# Patient Record
Sex: Female | Born: 1940 | Race: White | Hispanic: No | Smoking: Never smoker
Health system: Southern US, Community
[De-identification: ages and names within clinical notes are randomized; demographics above are authoritative.]

## PROBLEM LIST (undated history)

## (undated) DIAGNOSIS — G4733 Obstructive sleep apnea (adult) (pediatric): Secondary | ICD-10-CM

## (undated) DIAGNOSIS — M1990 Unspecified osteoarthritis, unspecified site: Secondary | ICD-10-CM

## (undated) DIAGNOSIS — A499 Bacterial infection, unspecified: Secondary | ICD-10-CM

## (undated) DIAGNOSIS — D649 Anemia, unspecified: Secondary | ICD-10-CM

## (undated) DIAGNOSIS — H4020X0 Unspecified primary angle-closure glaucoma, stage unspecified: Secondary | ICD-10-CM

## (undated) DIAGNOSIS — R200 Anesthesia of skin: Secondary | ICD-10-CM

## (undated) DIAGNOSIS — N3281 Overactive bladder: Secondary | ICD-10-CM

## (undated) DIAGNOSIS — T884XXA Failed or difficult intubation, initial encounter: Secondary | ICD-10-CM

## (undated) DIAGNOSIS — M549 Dorsalgia, unspecified: Secondary | ICD-10-CM

## (undated) DIAGNOSIS — G629 Polyneuropathy, unspecified: Secondary | ICD-10-CM

## (undated) DIAGNOSIS — J189 Pneumonia, unspecified organism: Secondary | ICD-10-CM

## (undated) DIAGNOSIS — R32 Unspecified urinary incontinence: Secondary | ICD-10-CM

## (undated) DIAGNOSIS — Z9889 Other specified postprocedural states: Secondary | ICD-10-CM

## (undated) DIAGNOSIS — G8929 Other chronic pain: Secondary | ICD-10-CM

## (undated) DIAGNOSIS — R7303 Prediabetes: Secondary | ICD-10-CM

## (undated) DIAGNOSIS — N390 Urinary tract infection, site not specified: Secondary | ICD-10-CM

## (undated) DIAGNOSIS — Z9989 Dependence on other enabling machines and devices: Secondary | ICD-10-CM

## (undated) DIAGNOSIS — R112 Nausea with vomiting, unspecified: Secondary | ICD-10-CM

## (undated) HISTORY — PX: BACK SURGERY: SHX140

## (undated) HISTORY — PX: APPENDECTOMY: SHX54

## (undated) HISTORY — PX: TUBAL LIGATION: SHX77

## (undated) HISTORY — PX: TONSILLECTOMY AND ADENOIDECTOMY: SUR1326

## (undated) HISTORY — PX: BREAST EXCISIONAL BIOPSY: SUR124

---

## 1978-05-11 HISTORY — PX: DILATION AND CURETTAGE OF UTERUS: SHX78

## 1978-05-11 HISTORY — PX: ABDOMINAL HYSTERECTOMY: SHX81

## 1988-05-11 HISTORY — PX: BLADDER SUSPENSION: SHX72

## 1997-08-01 ENCOUNTER — Ambulatory Visit (HOSPITAL_COMMUNITY): Admission: RE | Admit: 1997-08-01 | Discharge: 1997-08-01 | Payer: Self-pay | Admitting: Infectious Diseases

## 1998-12-25 ENCOUNTER — Ambulatory Visit (HOSPITAL_COMMUNITY): Admission: RE | Admit: 1998-12-25 | Discharge: 1998-12-25 | Payer: Self-pay | Admitting: Internal Medicine

## 1998-12-25 ENCOUNTER — Encounter: Payer: Self-pay | Admitting: Internal Medicine

## 2000-01-05 ENCOUNTER — Encounter: Payer: Self-pay | Admitting: Internal Medicine

## 2000-01-05 ENCOUNTER — Encounter: Admission: RE | Admit: 2000-01-05 | Discharge: 2000-01-05 | Payer: Self-pay | Admitting: Internal Medicine

## 2001-03-29 ENCOUNTER — Encounter: Payer: Self-pay | Admitting: Internal Medicine

## 2001-03-29 ENCOUNTER — Encounter: Admission: RE | Admit: 2001-03-29 | Discharge: 2001-03-29 | Payer: Self-pay | Admitting: Internal Medicine

## 2002-04-20 ENCOUNTER — Ambulatory Visit (HOSPITAL_COMMUNITY): Admission: RE | Admit: 2002-04-20 | Discharge: 2002-04-20 | Payer: Self-pay | Admitting: Gastroenterology

## 2002-06-08 ENCOUNTER — Encounter: Payer: Self-pay | Admitting: Internal Medicine

## 2002-06-08 ENCOUNTER — Encounter: Admission: RE | Admit: 2002-06-08 | Discharge: 2002-06-08 | Payer: Self-pay | Admitting: Internal Medicine

## 2002-12-13 ENCOUNTER — Encounter: Payer: Self-pay | Admitting: Internal Medicine

## 2002-12-13 ENCOUNTER — Ambulatory Visit (HOSPITAL_COMMUNITY): Admission: RE | Admit: 2002-12-13 | Discharge: 2002-12-13 | Payer: Self-pay | Admitting: Internal Medicine

## 2003-08-29 ENCOUNTER — Encounter: Admission: RE | Admit: 2003-08-29 | Discharge: 2003-08-29 | Payer: Self-pay | Admitting: Internal Medicine

## 2005-04-08 ENCOUNTER — Encounter: Admission: RE | Admit: 2005-04-08 | Discharge: 2005-04-08 | Payer: Self-pay | Admitting: Internal Medicine

## 2005-10-29 ENCOUNTER — Encounter: Admission: RE | Admit: 2005-10-29 | Discharge: 2005-10-29 | Payer: Self-pay | Admitting: Internal Medicine

## 2005-12-09 HISTORY — PX: ANTERIOR FUSION CERVICAL SPINE: SUR626

## 2005-12-30 ENCOUNTER — Ambulatory Visit (HOSPITAL_COMMUNITY): Admission: RE | Admit: 2005-12-30 | Discharge: 2005-12-31 | Payer: Self-pay | Admitting: Neurosurgery

## 2006-05-12 ENCOUNTER — Encounter: Admission: RE | Admit: 2006-05-12 | Discharge: 2006-05-12 | Payer: Self-pay | Admitting: Family Medicine

## 2007-06-28 ENCOUNTER — Encounter: Admission: RE | Admit: 2007-06-28 | Discharge: 2007-06-28 | Payer: Self-pay | Admitting: Family Medicine

## 2007-07-21 ENCOUNTER — Ambulatory Visit (HOSPITAL_COMMUNITY): Admission: RE | Admit: 2007-07-21 | Discharge: 2007-07-21 | Payer: Self-pay | Admitting: Neurosurgery

## 2008-06-11 HISTORY — PX: KNEE ARTHROPLASTY: SHX992

## 2008-07-12 ENCOUNTER — Encounter: Admission: RE | Admit: 2008-07-12 | Discharge: 2008-07-12 | Payer: Self-pay | Admitting: Family Medicine

## 2008-08-04 ENCOUNTER — Emergency Department (HOSPITAL_COMMUNITY): Admission: EM | Admit: 2008-08-04 | Discharge: 2008-08-04 | Payer: Self-pay | Admitting: Emergency Medicine

## 2009-11-01 ENCOUNTER — Encounter: Admission: RE | Admit: 2009-11-01 | Discharge: 2009-11-01 | Payer: Self-pay | Admitting: Family Medicine

## 2010-09-26 NOTE — Op Note (Signed)
   NAME:  AILED, DEFIBAUGH NO.:  [id number]   MEDICAL RECORD NO.:  [id number]                   PATIENT TYPE:   LOCATION:                                       FACILITY:   PHYSICIAN:  Danise Edge, M.D.                DATE OF BIRTH:  [birth year]   DATE OF PROCEDURE:  04/20/2002  DATE OF DISCHARGE:                                 OPERATIVE REPORT   INDICATIONS FOR PROCEDURE:  The patient is a 70 year old female born  1940-09-24.  The patient is scheduled to undergo her first screening  colonoscopy with polypectomy to prevent colon cancer.  Her mother underwent  colonoscopic examination to remove colon polyps, but there is no personal or  family history of colon cancer.   ENDOSCOPIST:  Danise Edge, M.D.   PREMEDICATION:  Versed 7.5 mg, Demerol 50 mg.   INSTRUMENT USED:  Olympus pediatric colonoscope.   DESCRIPTION OF PROCEDURE:  After obtaining informed consent, the patient was  placed in the left lateral decubitus position.  I administered intravenous  Demerol and intravenous Versed to achieve conscious sedation for the  patient.  The patient's blood pressure, oxygen saturation, and cardiac  rhythm were monitored throughout the procedure and documented in the medical  record.   Anal inspection was normal. Digital rectal examination was normal.  The  Olympus pediatric video colonoscope was introduced into the rectum and  advanced to the cecum.  A normal appearing ileocecal valve was intubated and  the distal ileum inspected.  Colonic preparation for the examination today  was excellent.   Rectum normal.   Sigmoid colon and descending colon normal.   Splenic flexure normal.   Transverse colon normal.   Hepatic flexure normal.   Ascending colon normal.   Cecum and ileocecal valve normal.   Distal ileum normal.    ASSESSMENT:  Normal screening proctocolonoscopy to the cecum.  No endoscopic  evidence of the presence of  colorectal neoplasia.                                               Danise Edge, M.D.    MJ/MEDQ  D:  04/20/2002  T:  04/20/2002  Job:  [id number]   cc:   Darius Bump, M.D.  [place]  [place] [address]  Fax: [phone]

## 2010-09-26 NOTE — Op Note (Signed)
NAMECLARKE, AMBURN NO.:  [id number]   MEDICAL RECORD NO.:  [id number]          PATIENT TYPE:  OIB   LOCATION:  3004                         FACILITY:  [facility]   PHYSICIAN:  Cristi Loron, M.D.DATE OF BIRTH:  01/10/1941   DATE OF PROCEDURE:  12/30/2005  DATE OF DISCHARGE:                                 OPERATIVE REPORT   BRIEF HISTORY:  The patient is a 70 year old white female who suffered from  neck and right arm pain consistent with a right C7 radiculopathy.  She  failed medical management and I discussed the various treatment with her  including surgery.  The patient has weighed the risks, benefits and  alternatives to surgery and decided top proceed with a C6-7 anterior  cervical diskectomy fusion and plating.   PREOPERATIVE DIAGNOSIS:  C6-7 herniated nucleus pulposus, spinal stenosis,  cervical radiculopathy, cervicalgia.   POSTOPERATIVE DIAGNOSIS:  C6-7 herniated nucleus pulposus, spinal stenosis,  cervical radiculopathy, cervicalgia.   PROCEDURE:  C6-7 extensive anterior cervical diskectomy/decompression; C6-7  interbody local autograft arthrodesis; insertion of a C6-7 interbody  prosthesis (Alphatec interbody PEEK prosthesis); C6-7 anterior cervical  plating (Codman Slim-Lock titanium plate and screws).   SURGEON:  Cristi Loron, M.D.   ASSISTANT:  Clydene Fake, M.D.   ANESTHESIA:  General endotracheal.   ESTIMATED BLOOD LOSS:  50 mL.   SPECIMENS:  None.   DRAINS:  None.   COMPLICATIONS:  None.   DESCRIPTION OF PROCEDURE:  The patient was brought to the operating room by  the anesthesia team.  General endotracheal anesthesia was induced.  The  patient remained in supine position.  A roll was placed under the shoulders  to place the neck in slight extension.  The anterior cervical region was  then prepared with Betadine scrub and Betadine solution.  Sterile drapes  were applied.  I then injected the area to be incised with  Marcaine with  epinephrine solution, used a scalpel to make a transverse incision in the  patient's left anterior neck.  I used Metzenbaum scissors to divide the  platysma muscle and then to dissect medial to the sternocleidomastoid  muscle, jugular vein and carotid artery.  I carefully dissected down toward  the anterior cervical spine, identifying the esophagus and retracting it  medially.  I then used the Kitner swabs to clear the soft tissue from the  anterior cervical spine and then I inserted a bent spinal needle into the  upper exposed intervertebral disk space.  We obtained intraoperative  radiograph to confirm our location.   We then used electrocautery to detach the medial border of the longus colli  muscle bilaterally from the C6-7 intervertebral disk space.  I  then  inserted a Caspar self-retaining for exposure.  I then incised the C6-7  intervertebral disk with 15 blade scalpel and performed a partial  intervertebral diskectomy with the pituitary forceps and the Carlens curet.  I then inserted distraction screws at C6 and C7, distracted the interspace  and then used a high speed drill to decorticate vertebral end plates at C6-  7, drill away  the remainder of the C6-7 intervertebral disk and thinned out  the posterior longitudinal ligament and drilled away some posterior  spondylosis.  I then incised the thinned out ligament with the arachnoid  knife and removed it with the Kerrison punch undercutting the vertebral end  plates at U9-8, decompressing the thecal sac.  I then performed foraminotomy  about the bilateral C7 nerve roots.  I found the expected disk herniation at  C6-7 on the right compressing the right C7 nerve root.  We got a good  decompression bilaterally.   We now turned our attention to arthrodesis.  We obtained a 7 mm medium  Alphatec PEEK interbody prosthesis and filled it with a combination of the  Alphatec putty and local autograft bone we obtained during  the  decompression. We then inserted this filled prosthesis into the distracted  C6-7 interspace.  We then removed distraction screws. There was good snug  fit of the prosthesis.   We now turned our attention to the anterior spinal instrumentation. We  obtained the appropriate length Codman Slim Lock anterior cervical plate and  laid it along the anterior aspect of the vertebral bodies from C6 to C7.  We  drilled two 12 mm holes at C6, two at C7 and then secured the plate at the  vertebral bodies by placing two 12 mm self tapping screws at C6 and two at  C7.  We then obtained an intraoperative radiograph.  We could not see the  plate or screws because of the patient's body habitus/shoulders but they  looked good in vivo.  I should mention that with the first intraoperative x-  ray the needle was in C4-5 and we counted down to the C6-7 interspace.  We  then locked the screws to the plate by locking each cam completing the  instrumentation.   We then obtained hemostasis using bipolar electrocautery.  We irrigated the  wound out with bacitracin solution, removed the solution and the Caspar self-  retaining retractor.  We inspected the esophagus for any damage.  There was  none apparent.  We then reapproximated the patient's platysma muscle with  interrupted 3-0 Vicryl sutures, subcutaneous tissues with interrupted 3-0  Vicryl suture and the skin with benzoin and Steri-Strips.  The wound was  then coated with bacitracin ointment.  A dry sterile dressing was applied.  The drapes were removed.  The patient was subsequently extubated by the  anesthesia team and transported to the post anesthesia care unit in stable  condition.  All sponge, needle and instrument counts were correct at the end  of this case.      Cristi Loron, M.D.  Electronically Signed     JDJ/MEDQ  D:  12/30/2005  T:  12/31/2005  Job:  [id number]

## 2010-11-06 ENCOUNTER — Other Ambulatory Visit: Payer: Self-pay | Admitting: Family Medicine

## 2010-11-06 DIAGNOSIS — Z1231 Encounter for screening mammogram for malignant neoplasm of breast: Secondary | ICD-10-CM

## 2010-12-05 ENCOUNTER — Ambulatory Visit
Admission: RE | Admit: 2010-12-05 | Discharge: 2010-12-05 | Disposition: A | Discharge status: Home / Self Care | Payer: Medicare Other | Source: Ambulatory Visit | Attending: Family Medicine | Admitting: Family Medicine

## 2010-12-05 DIAGNOSIS — Z1231 Encounter for screening mammogram for malignant neoplasm of breast: Secondary | ICD-10-CM

## 2011-01-14 ENCOUNTER — Ambulatory Visit: Payer: Medicare Other | Attending: Family Medicine

## 2011-01-14 DIAGNOSIS — R5381 Other malaise: Secondary | ICD-10-CM | POA: Insufficient documentation

## 2011-01-14 DIAGNOSIS — M545 Low back pain, unspecified: Secondary | ICD-10-CM | POA: Insufficient documentation

## 2011-01-14 DIAGNOSIS — M25569 Pain in unspecified knee: Secondary | ICD-10-CM | POA: Insufficient documentation

## 2011-01-14 DIAGNOSIS — IMO0001 Reserved for inherently not codable concepts without codable children: Secondary | ICD-10-CM | POA: Insufficient documentation

## 2011-01-16 ENCOUNTER — Ambulatory Visit: Payer: Medicare Other

## 2011-01-19 ENCOUNTER — Ambulatory Visit: Payer: Medicare Other

## 2011-01-22 ENCOUNTER — Ambulatory Visit: Payer: Medicare Other

## 2011-01-26 ENCOUNTER — Ambulatory Visit: Payer: Medicare Other | Admitting: Physical Therapy

## 2011-01-28 ENCOUNTER — Ambulatory Visit: Payer: Medicare Other | Admitting: Physical Therapy

## 2011-02-02 ENCOUNTER — Ambulatory Visit: Payer: Medicare Other

## 2011-02-04 ENCOUNTER — Ambulatory Visit: Payer: Medicare Other | Admitting: Physical Therapy

## 2011-05-20 DIAGNOSIS — R3915 Urgency of urination: Secondary | ICD-10-CM | POA: Diagnosis not present

## 2011-05-20 DIAGNOSIS — E669 Obesity, unspecified: Secondary | ICD-10-CM | POA: Diagnosis not present

## 2011-05-20 DIAGNOSIS — G4730 Sleep apnea, unspecified: Secondary | ICD-10-CM | POA: Diagnosis not present

## 2011-06-08 DIAGNOSIS — H04129 Dry eye syndrome of unspecified lacrimal gland: Secondary | ICD-10-CM | POA: Diagnosis not present

## 2011-06-08 DIAGNOSIS — H40019 Open angle with borderline findings, low risk, unspecified eye: Secondary | ICD-10-CM | POA: Diagnosis not present

## 2011-10-19 DIAGNOSIS — R030 Elevated blood-pressure reading, without diagnosis of hypertension: Secondary | ICD-10-CM | POA: Diagnosis not present

## 2011-10-19 DIAGNOSIS — N390 Urinary tract infection, site not specified: Secondary | ICD-10-CM | POA: Diagnosis not present

## 2011-10-19 DIAGNOSIS — R82998 Other abnormal findings in urine: Secondary | ICD-10-CM | POA: Diagnosis not present

## 2011-11-26 DIAGNOSIS — N390 Urinary tract infection, site not specified: Secondary | ICD-10-CM | POA: Diagnosis not present

## 2011-11-26 DIAGNOSIS — M5126 Other intervertebral disc displacement, lumbar region: Secondary | ICD-10-CM | POA: Diagnosis not present

## 2011-12-11 DIAGNOSIS — IMO0002 Reserved for concepts with insufficient information to code with codable children: Secondary | ICD-10-CM | POA: Diagnosis not present

## 2011-12-11 DIAGNOSIS — M545 Low back pain, unspecified: Secondary | ICD-10-CM | POA: Diagnosis not present

## 2011-12-16 ENCOUNTER — Other Ambulatory Visit: Payer: Self-pay | Admitting: Neurosurgery

## 2011-12-16 DIAGNOSIS — M549 Dorsalgia, unspecified: Secondary | ICD-10-CM

## 2011-12-17 ENCOUNTER — Ambulatory Visit
Admission: RE | Admit: 2011-12-17 | Discharge: 2011-12-17 | Disposition: A | Discharge status: Home / Self Care | Payer: Medicare Other | Source: Ambulatory Visit | Attending: Neurosurgery | Admitting: Neurosurgery

## 2011-12-17 DIAGNOSIS — M5126 Other intervertebral disc displacement, lumbar region: Secondary | ICD-10-CM | POA: Diagnosis not present

## 2011-12-17 DIAGNOSIS — M5137 Other intervertebral disc degeneration, lumbosacral region: Secondary | ICD-10-CM | POA: Diagnosis not present

## 2011-12-17 DIAGNOSIS — M549 Dorsalgia, unspecified: Secondary | ICD-10-CM

## 2011-12-17 DIAGNOSIS — M47817 Spondylosis without myelopathy or radiculopathy, lumbosacral region: Secondary | ICD-10-CM | POA: Diagnosis not present

## 2012-01-06 DIAGNOSIS — M545 Low back pain, unspecified: Secondary | ICD-10-CM | POA: Diagnosis not present

## 2012-01-14 ENCOUNTER — Other Ambulatory Visit: Payer: Self-pay | Admitting: Family Medicine

## 2012-01-14 DIAGNOSIS — Z1231 Encounter for screening mammogram for malignant neoplasm of breast: Secondary | ICD-10-CM

## 2012-02-01 DIAGNOSIS — H2510 Age-related nuclear cataract, unspecified eye: Secondary | ICD-10-CM | POA: Diagnosis not present

## 2012-02-01 DIAGNOSIS — H40019 Open angle with borderline findings, low risk, unspecified eye: Secondary | ICD-10-CM | POA: Diagnosis not present

## 2012-02-01 DIAGNOSIS — H04129 Dry eye syndrome of unspecified lacrimal gland: Secondary | ICD-10-CM | POA: Diagnosis not present

## 2012-02-01 DIAGNOSIS — H43399 Other vitreous opacities, unspecified eye: Secondary | ICD-10-CM | POA: Diagnosis not present

## 2012-02-02 DIAGNOSIS — N318 Other neuromuscular dysfunction of bladder: Secondary | ICD-10-CM | POA: Diagnosis not present

## 2012-02-02 DIAGNOSIS — R7309 Other abnormal glucose: Secondary | ICD-10-CM | POA: Diagnosis not present

## 2012-02-02 DIAGNOSIS — Z0000 Encounter for general adult medical examination without abnormal findings: Secondary | ICD-10-CM | POA: Diagnosis not present

## 2012-02-02 DIAGNOSIS — Z23 Encounter for immunization: Secondary | ICD-10-CM | POA: Diagnosis not present

## 2012-02-02 DIAGNOSIS — Z136 Encounter for screening for cardiovascular disorders: Secondary | ICD-10-CM | POA: Diagnosis not present

## 2012-02-02 DIAGNOSIS — E559 Vitamin D deficiency, unspecified: Secondary | ICD-10-CM | POA: Diagnosis not present

## 2012-02-02 DIAGNOSIS — Z79899 Other long term (current) drug therapy: Secondary | ICD-10-CM | POA: Diagnosis not present

## 2012-02-03 ENCOUNTER — Ambulatory Visit
Admission: RE | Admit: 2012-02-03 | Discharge: 2012-02-03 | Disposition: A | Discharge status: Home / Self Care | Payer: Medicare Other | Source: Ambulatory Visit | Attending: Family Medicine | Admitting: Family Medicine

## 2012-02-03 DIAGNOSIS — Z1231 Encounter for screening mammogram for malignant neoplasm of breast: Secondary | ICD-10-CM

## 2012-02-10 ENCOUNTER — Other Ambulatory Visit: Payer: Self-pay | Admitting: Neurosurgery

## 2012-03-15 DIAGNOSIS — M899 Disorder of bone, unspecified: Secondary | ICD-10-CM | POA: Diagnosis not present

## 2012-03-15 DIAGNOSIS — Z780 Asymptomatic menopausal state: Secondary | ICD-10-CM | POA: Diagnosis not present

## 2012-03-15 DIAGNOSIS — R29890 Loss of height: Secondary | ICD-10-CM | POA: Diagnosis not present

## 2012-03-15 DIAGNOSIS — M949 Disorder of cartilage, unspecified: Secondary | ICD-10-CM | POA: Diagnosis not present

## 2012-03-21 DIAGNOSIS — M2040 Other hammer toe(s) (acquired), unspecified foot: Secondary | ICD-10-CM | POA: Diagnosis not present

## 2012-03-21 DIAGNOSIS — M7150 Other bursitis, not elsewhere classified, unspecified site: Secondary | ICD-10-CM | POA: Diagnosis not present

## 2012-03-21 DIAGNOSIS — M79609 Pain in unspecified limb: Secondary | ICD-10-CM | POA: Diagnosis not present

## 2012-03-21 DIAGNOSIS — B351 Tinea unguium: Secondary | ICD-10-CM | POA: Diagnosis not present

## 2012-04-08 ENCOUNTER — Encounter (HOSPITAL_COMMUNITY): Payer: Self-pay | Admitting: Pharmacy Technician

## 2012-04-13 ENCOUNTER — Encounter (HOSPITAL_COMMUNITY)
Admission: RE | Admit: 2012-04-13 | Discharge: 2012-04-13 | Disposition: A | Discharge status: Home / Self Care | Payer: Medicare Other | Source: Ambulatory Visit | Attending: Neurosurgery | Admitting: Neurosurgery

## 2012-04-13 ENCOUNTER — Encounter (HOSPITAL_COMMUNITY): Payer: Self-pay

## 2012-04-13 HISTORY — DX: Nausea with vomiting, unspecified: R11.2

## 2012-04-13 HISTORY — DX: Other specified postprocedural states: Z98.890

## 2012-04-13 HISTORY — DX: Unspecified urinary incontinence: R32

## 2012-04-13 HISTORY — DX: Unspecified osteoarthritis, unspecified site: M19.90

## 2012-04-13 LAB — CBC
HCT: 39.4 % (ref 36.0–46.0)
Hemoglobin: 13.2 g/dL (ref 12.0–15.0)
MCH: 28 pg (ref 26.0–34.0)
MCHC: 33.5 g/dL (ref 30.0–36.0)
RDW: 13.3 % (ref 11.5–15.5)

## 2012-04-13 LAB — BASIC METABOLIC PANEL
BUN: 15 mg/dL (ref 6–23)
Calcium: 9.4 mg/dL (ref 8.4–10.5)
GFR calc Af Amer: 83 mL/min — ABNORMAL LOW (ref >90)
GFR calc non Af Amer: 72 mL/min — ABNORMAL LOW (ref >90)
Glucose, Bld: 103 mg/dL — ABNORMAL HIGH (ref 70–99)
Sodium: 139 mEq/L (ref 135–145)

## 2012-04-13 LAB — ABO/RH: ABO/RH(D): O POS

## 2012-04-13 NOTE — Pre-Procedure Instructions (Signed)
20 Lauren English  04/13/2012   Your procedure is scheduled on:  04/21/12  Report to [facility] at 530 AM.  Call this number if you have problems the morning of surgery: [phone]   Remember:   Do not eat food:After Midnight.    Take these medicines the morning of surgery with A SIP OF WATER: cipro,hydrocodone,ditropan   Do not wear jewelry, make-up or nail polish.  Do not wear lotions, powders, or perfumes. You may wear deodorant.  Do not shave 48 hours prior to surgery. Men may shave face and neck.  Do not bring valuables to the hospital.  Contacts, dentures or bridgework may not be worn into surgery.  Leave suitcase in the car. After surgery it may be brought to your room.  For patients admitted to the hospital, checkout time is 11:00 AM the day of discharge.   Patients discharged the day of surgery will not be allowed to drive home.  Name and phone number of your driver: family  Special Instructions: Shower using CHG 2 nights before surgery and the night before surgery.  If you shower the day of surgery use CHG.  Use special wash - you have one bottle of CHG for all showers.  You should use approximately 1/3 of the bottle for each shower.   Please read over the following fact sheets that you were given: Pain Booklet, Coughing and Deep Breathing, Blood Transfusion Information, MRSA Information and Surgical Site Infection Prevention

## 2012-04-20 MED ORDER — CEFAZOLIN SODIUM-DEXTROSE 2-3 GM-% IV SOLR
2.0000 g | INTRAVENOUS | Status: AC
  Administered 2012-04-21: 2 g via INTRAVENOUS
  Filled 2012-04-20: qty 50

## 2012-04-21 ENCOUNTER — Inpatient Hospital Stay (HOSPITAL_COMMUNITY): Payer: Medicare Other

## 2012-04-21 ENCOUNTER — Encounter (HOSPITAL_COMMUNITY): Payer: Self-pay | Admitting: General Practice

## 2012-04-21 ENCOUNTER — Inpatient Hospital Stay (HOSPITAL_COMMUNITY)
Admission: RE | Admit: 2012-04-21 | Discharge: 2012-04-25 | DRG: 460 | Disposition: A | Discharge status: Home / Self Care | Payer: Medicare Other | Source: Ambulatory Visit | Attending: Neurosurgery | Admitting: Neurosurgery

## 2012-04-21 ENCOUNTER — Inpatient Hospital Stay (HOSPITAL_COMMUNITY): Payer: Medicare Other | Admitting: Anesthesiology

## 2012-04-21 ENCOUNTER — Encounter (HOSPITAL_COMMUNITY): Payer: Self-pay | Admitting: Anesthesiology

## 2012-04-21 ENCOUNTER — Encounter (HOSPITAL_COMMUNITY): Payer: Self-pay | Admitting: *Deleted

## 2012-04-21 ENCOUNTER — Encounter (HOSPITAL_COMMUNITY)
Admission: RE | Disposition: A | Discharge status: Home / Self Care | Payer: Self-pay | Source: Ambulatory Visit | Attending: Neurosurgery

## 2012-04-21 DIAGNOSIS — IMO0002 Reserved for concepts with insufficient information to code with codable children: Secondary | ICD-10-CM | POA: Diagnosis not present

## 2012-04-21 DIAGNOSIS — M51379 Other intervertebral disc degeneration, lumbosacral region without mention of lumbar back pain or lower extremity pain: Secondary | ICD-10-CM | POA: Diagnosis present

## 2012-04-21 DIAGNOSIS — M519 Unspecified thoracic, thoracolumbar and lumbosacral intervertebral disc disorder: Secondary | ICD-10-CM | POA: Diagnosis not present

## 2012-04-21 DIAGNOSIS — M4310 Spondylolisthesis, site unspecified: Secondary | ICD-10-CM | POA: Diagnosis present

## 2012-04-21 DIAGNOSIS — M5137 Other intervertebral disc degeneration, lumbosacral region: Principal | ICD-10-CM | POA: Diagnosis present

## 2012-04-21 DIAGNOSIS — M4316 Spondylolisthesis, lumbar region: Secondary | ICD-10-CM

## 2012-04-21 DIAGNOSIS — M545 Low back pain, unspecified: Secondary | ICD-10-CM | POA: Diagnosis not present

## 2012-04-21 DIAGNOSIS — M48061 Spinal stenosis, lumbar region without neurogenic claudication: Secondary | ICD-10-CM | POA: Diagnosis not present

## 2012-04-21 HISTORY — DX: Dependence on other enabling machines and devices: Z99.89

## 2012-04-21 HISTORY — DX: Other chronic pain: G89.29

## 2012-04-21 HISTORY — DX: Prediabetes: R73.03

## 2012-04-21 HISTORY — DX: Unspecified primary angle-closure glaucoma, stage unspecified: H40.20X0

## 2012-04-21 HISTORY — DX: Dorsalgia, unspecified: M54.9

## 2012-04-21 HISTORY — DX: Obstructive sleep apnea (adult) (pediatric): G47.33

## 2012-04-21 HISTORY — PX: POSTERIOR LUMBAR FUSION: SHX6036

## 2012-04-21 SURGERY — POSTERIOR LUMBAR FUSION 1 LEVEL
Anesthesia: General | Site: Back | Laterality: Bilateral | Wound class: Clean

## 2012-04-21 MED ORDER — ONDANSETRON HCL 4 MG/2ML IJ SOLN: 4.0000 mg | INTRAMUSCULAR | Status: DC | PRN

## 2012-04-21 MED ORDER — BUPIVACAINE-EPINEPHRINE PF 0.5-1:200000 % IJ SOLN
INTRAMUSCULAR | Status: DC | PRN
  Administered 2012-04-21: 10 mL

## 2012-04-21 MED ORDER — MORPHINE SULFATE (PF) 1 MG/ML IV SOLN
INTRAVENOUS | Status: AC
  Filled 2012-04-21: qty 25

## 2012-04-21 MED ORDER — BUPIVACAINE LIPOSOME 1.3 % IJ SUSP
8.0000 mL | INTRAMUSCULAR | Status: DC
  Filled 2012-04-21: qty 20

## 2012-04-21 MED ORDER — ONDANSETRON HCL 4 MG/2ML IJ SOLN: 4.0000 mg | Freq: Four times a day (QID) | INTRAMUSCULAR | Status: DC | PRN

## 2012-04-21 MED ORDER — ROCURONIUM BROMIDE 100 MG/10ML IV SOLN
INTRAVENOUS | Status: DC | PRN
  Administered 2012-04-21: 50 mg via INTRAVENOUS
  Administered 2012-04-21: 10 mg via INTRAVENOUS

## 2012-04-21 MED ORDER — DOCUSATE SODIUM 100 MG PO CAPS
100.0000 mg | ORAL_CAPSULE | Freq: Two times a day (BID) | ORAL | Status: DC
  Administered 2012-04-21 – 2012-04-25 (×8): 100 mg via ORAL
  Filled 2012-04-21 (×8): qty 1

## 2012-04-21 MED ORDER — SODIUM CHLORIDE 0.9 % IJ SOLN
3.0000 mL | Freq: Two times a day (BID) | INTRAMUSCULAR | Status: DC
  Administered 2012-04-21 – 2012-04-24 (×6): 3 mL via INTRAVENOUS

## 2012-04-21 MED ORDER — DIPHENHYDRAMINE HCL 50 MG/ML IJ SOLN: 12.5000 mg | Freq: Four times a day (QID) | INTRAMUSCULAR | Status: DC | PRN

## 2012-04-21 MED ORDER — MORPHINE SULFATE (PF) 1 MG/ML IV SOLN
INTRAVENOUS | Status: DC
  Administered 2012-04-21: 4.5 mg via INTRAVENOUS
  Administered 2012-04-21: 14:00:00 via INTRAVENOUS
  Administered 2012-04-21: 10.5 mg via INTRAVENOUS
  Administered 2012-04-22 (×4): 1.5 mg via INTRAVENOUS
  Administered 2012-04-22: 12:00:00 via INTRAVENOUS
  Administered 2012-04-22: 1.5 mg via INTRAVENOUS
  Administered 2012-04-22: 3.71 mg via INTRAVENOUS
  Administered 2012-04-22: 3 mg via INTRAVENOUS
  Administered 2012-04-23: 1.5 mg via INTRAVENOUS
  Administered 2012-04-23: 0.1 mg via INTRAVENOUS
  Filled 2012-04-21: qty 25

## 2012-04-21 MED ORDER — ZOLPIDEM TARTRATE 5 MG PO TABS
5.0000 mg | ORAL_TABLET | Freq: Every evening | ORAL | Status: DC | PRN
  Filled 2012-04-21: qty 1

## 2012-04-21 MED ORDER — LIDOCAINE HCL (CARDIAC) 20 MG/ML IV SOLN
INTRAVENOUS | Status: DC | PRN
  Administered 2012-04-21: 80 mg via INTRAVENOUS

## 2012-04-21 MED ORDER — OXYCODONE HCL 5 MG PO TABS: 5.0000 mg | ORAL_TABLET | Freq: Once | ORAL | Status: DC | PRN

## 2012-04-21 MED ORDER — DIPHENHYDRAMINE HCL 12.5 MG/5ML PO ELIX: 12.5000 mg | ORAL_SOLUTION | Freq: Four times a day (QID) | ORAL | Status: DC | PRN

## 2012-04-21 MED ORDER — LIDOCAINE HCL 4 % MT SOLN
OROMUCOSAL | Status: DC | PRN
  Administered 2012-04-21: 4 mL via TOPICAL

## 2012-04-21 MED ORDER — HYDROMORPHONE HCL PF 1 MG/ML IJ SOLN
INTRAMUSCULAR | Status: AC
  Filled 2012-04-21: qty 1

## 2012-04-21 MED ORDER — SODIUM CHLORIDE 0.9 % IJ SOLN: 3.0000 mL | INTRAMUSCULAR | Status: DC | PRN

## 2012-04-21 MED ORDER — SODIUM CHLORIDE 0.9 % IV SOLN
INTRAVENOUS | Status: AC
  Filled 2012-04-21: qty 500

## 2012-04-21 MED ORDER — NALOXONE HCL 0.4 MG/ML IJ SOLN: 0.4000 mg | INTRAMUSCULAR | Status: DC | PRN

## 2012-04-21 MED ORDER — BUPIVACAINE LIPOSOME 1.3 % IJ SUSP
INTRAMUSCULAR | Status: DC | PRN
  Administered 2012-04-21: 20 mL

## 2012-04-21 MED ORDER — ONDANSETRON HCL 4 MG/2ML IJ SOLN
INTRAMUSCULAR | Status: DC | PRN
  Administered 2012-04-21 (×2): 4 mg via INTRAVENOUS

## 2012-04-21 MED ORDER — BACITRACIN ZINC 500 UNIT/GM EX OINT
TOPICAL_OINTMENT | CUTANEOUS | Status: DC | PRN
  Administered 2012-04-21: 1 via TOPICAL

## 2012-04-21 MED ORDER — BUPIVACAINE LIPOSOME 1.3 % IJ SUSP: 8.0000 mL | INTRAMUSCULAR | Status: DC

## 2012-04-21 MED ORDER — ACETAMINOPHEN 650 MG RE SUPP: 650.0000 mg | RECTAL | Status: DC | PRN

## 2012-04-21 MED ORDER — NEOSTIGMINE METHYLSULFATE 1 MG/ML IJ SOLN
INTRAMUSCULAR | Status: DC | PRN
  Administered 2012-04-21: 4 mg via INTRAVENOUS

## 2012-04-21 MED ORDER — PROPOFOL 10 MG/ML IV BOLUS
INTRAVENOUS | Status: DC | PRN
  Administered 2012-04-21: 200 mg via INTRAVENOUS

## 2012-04-21 MED ORDER — MIDAZOLAM HCL 5 MG/5ML IJ SOLN
INTRAMUSCULAR | Status: DC | PRN
  Administered 2012-04-21 (×2): 1 mg via INTRAVENOUS

## 2012-04-21 MED ORDER — DIAZEPAM 5 MG PO TABS: 5.0000 mg | ORAL_TABLET | Freq: Four times a day (QID) | ORAL | Status: DC | PRN

## 2012-04-21 MED ORDER — GLYCOPYRROLATE 0.2 MG/ML IJ SOLN
INTRAMUSCULAR | Status: DC | PRN
  Administered 2012-04-21: .8 mg via INTRAVENOUS

## 2012-04-21 MED ORDER — 0.9 % SODIUM CHLORIDE (POUR BTL) OPTIME
TOPICAL | Status: DC | PRN
  Administered 2012-04-21: 1000 mL

## 2012-04-21 MED ORDER — SODIUM CHLORIDE 0.9 % IJ SOLN: 9.0000 mL | INTRAMUSCULAR | Status: DC | PRN

## 2012-04-21 MED ORDER — OXYCODONE HCL 5 MG/5ML PO SOLN: 5.0000 mg | Freq: Once | ORAL | Status: DC | PRN

## 2012-04-21 MED ORDER — SODIUM CHLORIDE 0.9 % IR SOLN
Status: DC | PRN
  Administered 2012-04-21: 08:00:00

## 2012-04-21 MED ORDER — METOCLOPRAMIDE HCL 5 MG/ML IJ SOLN: 10.0000 mg | Freq: Once | INTRAMUSCULAR | Status: DC | PRN

## 2012-04-21 MED ORDER — OXYCODONE-ACETAMINOPHEN 5-325 MG PO TABS
1.0000 | ORAL_TABLET | ORAL | Status: DC | PRN
  Filled 2012-04-21: qty 2

## 2012-04-21 MED ORDER — PHENOL 1.4 % MT LIQD: 1.0000 | OROMUCOSAL | Status: DC | PRN

## 2012-04-21 MED ORDER — FENTANYL CITRATE 0.05 MG/ML IJ SOLN
INTRAMUSCULAR | Status: DC | PRN
  Administered 2012-04-21: 50 ug via INTRAVENOUS
  Administered 2012-04-21: 100 ug via INTRAVENOUS
  Administered 2012-04-21 (×4): 50 ug via INTRAVENOUS

## 2012-04-21 MED ORDER — MENTHOL 3 MG MT LOZG: 1.0000 | LOZENGE | OROMUCOSAL | Status: DC | PRN

## 2012-04-21 MED ORDER — CEFAZOLIN SODIUM-DEXTROSE 2-3 GM-% IV SOLR
2.0000 g | Freq: Three times a day (TID) | INTRAVENOUS | Status: AC
  Administered 2012-04-21 – 2012-04-22 (×2): 2 g via INTRAVENOUS
  Filled 2012-04-21 (×2): qty 50

## 2012-04-21 MED ORDER — LACTATED RINGERS IV SOLN
INTRAVENOUS | Status: DC | PRN
  Administered 2012-04-21 (×3): via INTRAVENOUS

## 2012-04-21 MED ORDER — DEXAMETHASONE SODIUM PHOSPHATE 4 MG/ML IJ SOLN
INTRAMUSCULAR | Status: DC | PRN
  Administered 2012-04-21: 8 mg via INTRAVENOUS

## 2012-04-21 MED ORDER — SODIUM CHLORIDE 0.9 % IV SOLN
10.0000 mg | INTRAVENOUS | Status: DC | PRN
  Administered 2012-04-21: 5 ug/min via INTRAVENOUS

## 2012-04-21 MED ORDER — SODIUM CHLORIDE 0.9 % IV SOLN: 250.0000 mL | INTRAVENOUS | Status: DC

## 2012-04-21 MED ORDER — HYDROMORPHONE HCL PF 1 MG/ML IJ SOLN
0.2500 mg | INTRAMUSCULAR | Status: DC | PRN
  Administered 2012-04-21 (×4): 0.5 mg via INTRAVENOUS

## 2012-04-21 MED ORDER — BACITRACIN 50000 UNITS IM SOLR
INTRAMUSCULAR | Status: AC
  Filled 2012-04-21: qty 1

## 2012-04-21 MED ORDER — ACETAMINOPHEN 325 MG PO TABS
650.0000 mg | ORAL_TABLET | ORAL | Status: DC | PRN
  Filled 2012-04-21: qty 2

## 2012-04-21 MED ORDER — SURGIFOAM 100 EX MISC
CUTANEOUS | Status: DC | PRN
  Administered 2012-04-21: 08:00:00 via TOPICAL

## 2012-04-21 MED ORDER — OXYBUTYNIN CHLORIDE 5 MG PO TABS
5.0000 mg | ORAL_TABLET | Freq: Every evening | ORAL | Status: DC
  Administered 2012-04-21 – 2012-04-24 (×4): 5 mg via ORAL
  Filled 2012-04-21 (×6): qty 1

## 2012-04-21 MED ORDER — HYDROCODONE-ACETAMINOPHEN 5-325 MG PO TABS
1.0000 | ORAL_TABLET | ORAL | Status: DC | PRN
  Administered 2012-04-23 – 2012-04-25 (×11): 1 via ORAL
  Filled 2012-04-21 (×12): qty 1

## 2012-04-21 MED ORDER — LACTATED RINGERS IV SOLN
INTRAVENOUS | Status: DC
  Administered 2012-04-21 – 2012-04-22 (×3): via INTRAVENOUS

## 2012-04-21 MED ORDER — OXYCODONE HCL 5 MG PO TABS
ORAL_TABLET | ORAL | Status: AC
  Filled 2012-04-21: qty 1

## 2012-04-21 SURGICAL SUPPLY — 67 items
BAG DECANTER FOR FLEXI CONT (MISCELLANEOUS) ×2 IMPLANT
BENZOIN TINCTURE PRP APPL 2/3 (GAUZE/BANDAGES/DRESSINGS) ×2 IMPLANT
BLADE SURG ROTATE 9660 (MISCELLANEOUS) IMPLANT
BRUSH SCRUB EZ PLAIN DRY (MISCELLANEOUS) ×2 IMPLANT
BUR ACORN 6.0 (BURR) ×2 IMPLANT
BUR MATCHSTICK NEURO 3.0 LAGG (BURR) ×2 IMPLANT
CANISTER SUCTION 2500CC (MISCELLANEOUS) ×2 IMPLANT
CAP REVERE LOCKING (Cap) ×8 IMPLANT
CLOTH BEACON ORANGE TIMEOUT ST (SAFETY) ×2 IMPLANT
CONT SPEC 4OZ CLIKSEAL STRL BL (MISCELLANEOUS) ×2 IMPLANT
COVER BACK TABLE 24X17X13 BIG (DRAPES) IMPLANT
COVER TABLE BACK 60X90 (DRAPES) ×2 IMPLANT
DRAPE C-ARM 42X72 X-RAY (DRAPES) ×8 IMPLANT
DRAPE LAPAROTOMY 100X72X124 (DRAPES) ×2 IMPLANT
DRAPE POUCH INSTRU U-SHP 10X18 (DRAPES) ×2 IMPLANT
DRAPE PROXIMA HALF (DRAPES) ×6 IMPLANT
DRAPE SURG 17X23 STRL (DRAPES) ×8 IMPLANT
ELECT BLADE 4.0 EZ CLEAN MEGAD (MISCELLANEOUS) ×2
ELECT REM PT RETURN 9FT ADLT (ELECTROSURGICAL) ×2
ELECTRODE BLDE 4.0 EZ CLN MEGD (MISCELLANEOUS) ×1 IMPLANT
ELECTRODE REM PT RTRN 9FT ADLT (ELECTROSURGICAL) ×1 IMPLANT
EVACUATOR 1/8 PVC DRAIN (DRAIN) ×2 IMPLANT
GAUZE SPONGE 4X4 16PLY XRAY LF (GAUZE/BANDAGES/DRESSINGS) ×2 IMPLANT
GLOVE BIO SURGEON STRL SZ 6.5 (GLOVE) ×4 IMPLANT
GLOVE BIO SURGEON STRL SZ8.5 (GLOVE) ×4 IMPLANT
GLOVE BIOGEL PI IND STRL 7.0 (GLOVE) ×4 IMPLANT
GLOVE BIOGEL PI INDICATOR 7.0 (GLOVE) ×4
GLOVE ECLIPSE 7.5 STRL STRAW (GLOVE) ×2 IMPLANT
GLOVE EXAM NITRILE LRG STRL (GLOVE) IMPLANT
GLOVE EXAM NITRILE MD LF STRL (GLOVE) ×4 IMPLANT
GLOVE EXAM NITRILE XL STR (GLOVE) IMPLANT
GLOVE EXAM NITRILE XS STR PU (GLOVE) IMPLANT
GLOVE SS BIOGEL STRL SZ 6.5 (GLOVE) ×4 IMPLANT
GLOVE SS BIOGEL STRL SZ 8 (GLOVE) ×2 IMPLANT
GLOVE SUPERSENSE BIOGEL SZ 6.5 (GLOVE) ×4
GLOVE SUPERSENSE BIOGEL SZ 8 (GLOVE) ×2
GOWN BRE IMP SLV AUR LG STRL (GOWN DISPOSABLE) ×6 IMPLANT
GOWN BRE IMP SLV AUR XL STRL (GOWN DISPOSABLE) ×6 IMPLANT
GOWN STRL REIN 2XL LVL4 (GOWN DISPOSABLE) IMPLANT
KIT BASIN OR (CUSTOM PROCEDURE TRAY) ×2 IMPLANT
KIT ROOM TURNOVER OR (KITS) ×2 IMPLANT
NEEDLE HYPO 21X1.5 SAFETY (NEEDLE) ×2 IMPLANT
NEEDLE HYPO 25X1 1.5 SAFETY (NEEDLE) ×2 IMPLANT
NS IRRIG 1000ML POUR BTL (IV SOLUTION) ×2 IMPLANT
PACK FOAM VITOSS 10CC (Orthopedic Implant) ×2 IMPLANT
PACK LAMINECTOMY NEURO (CUSTOM PROCEDURE TRAY) ×2 IMPLANT
PAD ARMBOARD 7.5X6 YLW CONV (MISCELLANEOUS) ×6 IMPLANT
PATTIES SURGICAL .5 X1 (DISPOSABLE) IMPLANT
PUTTY 10ML ACTIFUSE ABX (Putty) ×2 IMPLANT
ROD REVERE 6.35 40MM (Rod) ×4 IMPLANT
SCREW 7.5X50MM (Screw) ×8 IMPLANT
SPACER SUSTAIN O 10X26 12MM (Spacer) ×4 IMPLANT
SPONGE GAUZE 4X4 12PLY (GAUZE/BANDAGES/DRESSINGS) ×2 IMPLANT
SPONGE LAP 4X18 X RAY DECT (DISPOSABLE) IMPLANT
SPONGE NEURO XRAY DETECT 1X3 (DISPOSABLE) IMPLANT
SPONGE SURGIFOAM ABS GEL 100 (HEMOSTASIS) ×2 IMPLANT
STRIP CLOSURE SKIN 1/2X4 (GAUZE/BANDAGES/DRESSINGS) ×2 IMPLANT
SUT VIC AB 1 CT1 18XBRD ANBCTR (SUTURE) ×2 IMPLANT
SUT VIC AB 1 CT1 8-18 (SUTURE) ×2
SUT VIC AB 2-0 CP2 18 (SUTURE) ×4 IMPLANT
SYR 20CC LL (SYRINGE) ×2 IMPLANT
SYR 20ML ECCENTRIC (SYRINGE) ×2 IMPLANT
TAPE CLOTH SURG 4X10 WHT LF (GAUZE/BANDAGES/DRESSINGS) ×2 IMPLANT
TOWEL OR 17X24 6PK STRL BLUE (TOWEL DISPOSABLE) ×2 IMPLANT
TOWEL OR 17X26 10 PK STRL BLUE (TOWEL DISPOSABLE) ×2 IMPLANT
TRAY FOLEY CATH 14FRSI W/METER (CATHETERS) ×2 IMPLANT
WATER STERILE IRR 1000ML POUR (IV SOLUTION) ×2 IMPLANT

## 2012-04-21 NOTE — Anesthesia Postprocedure Evaluation (Signed)
Anesthesia Post Note  Patient: Lauren English  Procedure(s) Performed: Procedure(s) (LRB): POSTERIOR LUMBAR FUSION 1 LEVEL (Bilateral)  Anesthesia type: general  Patient location: PACU  Post pain: Pain level controlled  Post assessment: Patient's Cardiovascular Status Stable  Last Vitals:  Filed Vitals:   04/21/12 1145  BP: 145/69  Pulse: 66  Temp: 36.4 C  Resp: 12    Post vital signs: Reviewed and stable  Level of consciousness: sedated  Complications: No apparent anesthesia complications

## 2012-04-21 NOTE — Transfer of Care (Signed)
Immediate Anesthesia Transfer of Care Note  Patient: Lauren English  Procedure(s) Performed: Procedure(s) (LRB) with comments: POSTERIOR LUMBAR FUSION 1 LEVEL (Bilateral) - Lumbar four-five laminectomy with posterior lumbar interbody fusion witn interbody prothesis posterolateral arthrodesis and posterior nonsegmental instrumentation  Patient Location: PACU  Anesthesia Type:General  Level of Consciousness: awake and alert   Airway & Oxygen Therapy: Patient Spontanous Breathing and Patient connected to nasal cannula oxygen  Post-op Assessment: Report given to PACU RN and Post -op Vital signs reviewed and stable  Post vital signs: Reviewed and stable  Complications: No apparent anesthesia complications

## 2012-04-21 NOTE — H&P (Signed)
Subjective: The patient is a 71 year old white female who has suffered from back and leg pain. She has failed medical management and was worked up with a lumbar MRI. This demonstrated a spondylolisthesis and spinal stenosis at L4-5. I discussed the various treatment options with the patient including surgery. The patient has weighed the risks, benefits, and alternatives surgery and decided proceed with an L4-5 decompression, instrumentation, and fusion.   Past Medical History  Diagnosis Date  . PONV (postoperative nausea and vomiting)   . Sleep apnea   . Bladder incontinence   . Glaucoma   . Arthritis     op    Past Surgical History  Procedure Date  . Back surgery   . Knee arthroplasty   . Abdominal hysterectomy   . Tonsillectomy     No Known Allergies  History  Substance Use Topics  . Smoking status: Never Smoker   . Smokeless tobacco: Not on file  . Alcohol Use: Yes     Comment: occ    History reviewed. No pertinent family history. Prior to Admission medications   Medication Sig Start Date End Date Taking? Authorizing Provider  Acetylcarnitine HCl (ACETYL L-CARNITINE) 500 MG CAPS Take 500 mg by mouth daily.   Yes Historical Provider, MD  cholecalciferol (VITAMIN D) 1000 UNITS tablet Take 1,000 Units by mouth 2 (two) times daily.   Yes Historical Provider, MD  ciprofloxacin (CIPRO) 500 MG tablet Take 500 mg by mouth 2 (two) times daily as needed. 3 day course for burning and frequency of urination, current course started Wednesday 04/06/12   Yes Historical Provider, MD  CRANBERRY PO Take 870 mg by mouth 2 (two) times daily.   Yes Historical Provider, MD  docusate sodium (COLACE) 100 MG capsule Take 100 mg by mouth 2 (two) times daily.   Yes Historical Provider, MD  Glucosamine-Chondroitin 750-600 MG TABS Take 1 tablet by mouth 2 (two) times daily.   Yes Historical Provider, MD  HYDROcodone-acetaminophen (NORCO) 10-325 MG per tablet Take 1 tablet by mouth 3 (three) times daily.   01/22/12  Yes Historical Provider, MD  Boris Lown Oil 300 MG CAPS Take 300 mg by mouth daily.   Yes Historical Provider, MD  Multiple Minerals-Vitamins (CALCIUM CITRATE PLUS PO) Take 500 mg by mouth 2 (two) times daily. With 800 i.u. Vitamin d3, 10 mg vitamin b-6, 80 mg magnesium, 10 mg zinc, 1 mg copper, 1 mg manganese, 5 mg sodium, 1 mg boron   Yes Historical Provider, MD  oxybutynin (DITROPAN) 5 MG tablet Take 5 mg by mouth every evening.  12/31/11  Yes Historical Provider, MD  TURMERIC CURCUMIN PO Take 250 mg by mouth every morning.   Yes Historical Provider, MD  diclofenac sodium (VOLTAREN) 1 % GEL Apply 1 g topically daily as needed. For toe pain    Historical Provider, MD     Review of Systems  Positive ROS: As above  All other systems have been reviewed and were otherwise negative with the exception of those mentioned in the HPI and as above.  Objective: Vital signs in last 24 hours: Temp:  [98 F (36.7 C)] 98 F (36.7 C) (12/12 0606) Pulse Rate:  [67] 67  (12/12 0606) Resp:  [20] 20  (12/12 0606) BP: (122)/(71) 122/71 mmHg (12/12 0606) SpO2:  [96 %] 96 % (12/12 0606)  General Appearance: Alert, cooperative, no distress, appears stated age Head: Normocephalic, without obvious abnormality, atraumatic Eyes: PERRL, conjunctiva/corneas clear, EOM's intact, fundi benign, both eyes  Ears: Normal TM's and external ear canals, both ears Throat: Lips, mucosa, and tongue normal; teeth and gums normal Neck: Supple, symmetrical, trachea midline, no adenopathy; thyroid: No enlargement/tenderness/nodules; no carotid bruit or JVD Back: Symmetric, no curvature, ROM normal, no CVA tenderness Lungs: Clear to auscultation bilaterally, respirations unlabored Heart: Regular rate and rhythm, S1 and S2 normal, no murmur, rub or gallop Abdomen: Soft, non-tender, bowel sounds active all four quadrants, no masses, no organomegaly Extremities: Extremities normal, atraumatic, no cyanosis or edema Pulses:  2+ and symmetric all extremities Skin: Skin color, texture, turgor normal, no rashes or lesions  NEUROLOGIC:   Mental status: alert and oriented, no aphasia, good attention span, Fund of knowledge/ memory ok Motor Exam - grossly normal Sensory Exam - grossly normal Reflexes:  Coordination - grossly normal Gait - grossly normal Balance - grossly normal Cranial Nerves: I: smell Not tested  II: visual acuity  OS: Normal    OD: Normal   II: visual fields Full to confrontation  II: pupils Equal, round, reactive to light  III,VII: ptosis None  III,IV,VI: extraocular muscles  Full ROM  V: mastication Normal  V: facial light touch sensation  Normal  V,VII: corneal reflex  Present  VII: facial muscle function - upper  Normal  VII: facial muscle function - lower Normal  VIII: hearing Not tested  IX: soft palate elevation  Normal  IX,X: gag reflex Present  XI: trapezius strength  5/5  XI: sternocleidomastoid strength 5/5  XI: neck flexion strength  5/5  XII: tongue strength  Normal    Data Review Lab Results  Component Value Date   WBC 5.9 04/13/2012   HGB 13.2 04/13/2012   HCT 39.4 04/13/2012   MCV 83.7 04/13/2012   PLT 180 04/13/2012   Lab Results  Component Value Date   NA 139 04/13/2012   K 4.0 04/13/2012   CL 104 04/13/2012   CO2 26 04/13/2012   BUN 15 04/13/2012   CREATININE 0.82 04/13/2012   GLUCOSE 103* 04/13/2012   No results found for this basename: INR, PROTIME    Assessment/Plan: L4-5 spondylolisthesis, spinal stenosis, neurogenic claudication, lumbago: I discussed situation with the patient. I have reviewed her MR scan with her and pointed out the abnormalities. We have discussed the various treatment options including surgery. I have described the surgical option of an L4-5 decompression, agitation, and fusion. I have shown her surgical models. We have discussed the risks, benefits, alternatives, and likelihood of achieving our goals with surgery. I have answered all  her questions. The patient has decided to proceed with the operation.   Lauren English D 04/21/2012 7:23 AM

## 2012-04-21 NOTE — Clinical Social Work Note (Signed)
Clinical Social Work   CSW received cosnult for SNF. CSW reviewed chart. Awaiting PT/OT evals for discharge recommendations. CSW will assess for SNF, if appropriate. Please call with any urgent needs. CSW will continue to follow.   Dede Query, MSW, [facility]ors [phone]

## 2012-04-21 NOTE — Preoperative (Signed)
Beta Blockers   Reason not to administer Beta Blockers:Not Applicable 

## 2012-04-21 NOTE — Progress Notes (Signed)
UR COMPLETED  

## 2012-04-21 NOTE — Care Management (Signed)
Cpap placed in room on 7cmH2O. Pt unsure if she will be able to wear because of co2 monotor.

## 2012-04-21 NOTE — Progress Notes (Signed)
Subjective:  The patient is alert and pleasant. She looks well. She is in no apparent distress.  Objective: Vital signs in last 24 hours: Temp:  [97.6 F (36.4 C)-98 F (36.7 C)] 97.6 F (36.4 C) (12/12 1145) Pulse Rate:  [66-67] 66  (12/12 1145) Resp:  [12-20] 12  (12/12 1145) BP: (122-145)/(69-71) 145/69 mmHg (12/12 1145) SpO2:  [96 %] 96 % (12/12 0606)  Intake/Output from previous day:   Intake/Output this shift: Total I/O In: 2500 [I.V.:2500] Out: 1135 [Urine:910; Blood:225]  Physical exam the patient is moving her lower extremities well.  Lab Results: No results found for this basename: WBC:2,HGB:2,HCT:2,PLT:2 in the last 72 hours BMET No results found for this basename: NA:2,K:2,CL:2,CO2:2,GLUCOSE:2,BUN:2,CREATININE:2,CALCIUM:2 in the last 72 hours  Studies/Results: No results found.  Assessment/Plan: Patient is doing well.  LOS: 0 days     Cory Rama D 04/21/2012, 12:34 PM

## 2012-04-21 NOTE — Anesthesia Preprocedure Evaluation (Signed)
Anesthesia Evaluation  Patient identified by MRN, date of birth, ID band Patient awake    Reviewed: Allergy & Precautions, H&P , NPO status , Patient's Chart, lab work & pertinent test results, reviewed documented beta blocker date and time   History of Anesthesia Complications (+) PONV  Airway Mallampati: II TM Distance: >3 FB Neck ROM: full    Dental   Pulmonary sleep apnea ,  breath sounds clear to auscultation        Cardiovascular negative cardio ROS  Rhythm:regular     Neuro/Psych negative neurological ROS  negative psych ROS   GI/Hepatic negative GI ROS, Neg liver ROS,   Endo/Other  negative endocrine ROS  Renal/GU negative Renal ROS  negative genitourinary   Musculoskeletal   Abdominal   Peds  Hematology negative hematology ROS (+)   Anesthesia Other Findings See surgeon's H&P   Reproductive/Obstetrics negative OB ROS                           Anesthesia Physical Anesthesia Plan  ASA: II  Anesthesia Plan: General   Post-op Pain Management:    Induction: Intravenous  Airway Management Planned: Oral ETT  Additional Equipment:   Intra-op Plan:   Post-operative Plan: Extubation in OR  Informed Consent: I have reviewed the patients History and Physical, chart, labs and discussed the procedure including the risks, benefits and alternatives for the proposed anesthesia with the patient or authorized representative who has indicated his/her understanding and acceptance.   Dental Advisory Given  Plan Discussed with: CRNA and Surgeon  Anesthesia Plan Comments:         Anesthesia Quick Evaluation

## 2012-04-21 NOTE — Op Note (Signed)
Brief history: The patient is a 71 year old white female who has had back and leg pain. She has failed medical management and was worked up with a lumbar MRI. This demonstrated a spondylolisthesis and spinal stenosis at L4-5 with compression of both the L4 and L5 nerve roots. I discussed the various treatment options with the patient including surgery. The patient has weighed the risks, pets, and alternatives surgery and decided proceed with a lumbar decompression, agitation, and fusion.  Preoperative diagnosis: L4-5 spondylolisthesis, Degenerative disc disease, spinal stenosis compressing both the L4 and the L5 nerve roots; lumbago; lumbar radiculopathy  Postoperative diagnosis  Procedure: Bilateral L4 Laminotomy/foraminotomies to decompress the bilateral L4 and L5 nerve roots(the work required to do this was in addition to the work required to do the posterior lumbar interbody fusion because of the patient's spinal stenosis, facet arthropathy. Etc. requiring a wide decompression of the nerve roots.); L4-5 posterior lumbar interbody fusion with local morselized autograft bone and Actifusebone graft extender; insertion of interbody prosthesis at L4-5 (globus peek interbody prosthesis); posterior nonsegmental instrumentation from L4 to L5 with globus titanium pedicle screws and rods; posterior lateral arthrodesis at L4-5 with local morselized autograft bone and Vitoss bone graft extender.  Surgeon: Dr. Delma Officer  Asst.: Dr. Orbie Hurst  Anesthesia: Gen. endotracheal  Estimated blood loss: 250 cc  Drains: None  Locations: None  Description of procedure: The patient was brought to the operating room by the anesthesia team. General endotracheal anesthesia was induced. The patient was turned to the prone position on the Wilson frame. The patient's lumbosacral region was then prepared with Betadine scrub and Betadine solution. Sterile drapes were applied.  I then injected the area to be incised  with Marcaine with epinephrine solution. I then used the scalpel to make a linear midline incision over the L4-5 interspace. I then used electrocautery to perform a bilateral subperiosteal dissection exposing the spinous process and lamina of L4 and L5. We then obtained intraoperative radiograph to confirm our location. We then inserted the Verstrac retractor to provide exposure.  I began the decompression by using the high speed drill to perform laminotomies at L4. We then used the Kerrison punches to widen the laminotomy and removed the ligamentum flavum at L4-5. We used the Kerrison punches to remove the medial facets at L4-5. We performed wide foraminotomies about the bilateral L4 and L5 nerve roots completing the decompression.  We now turned our attention to the posterior lumbar interbody fusion. I used a scalpel to incise the intervertebral disc at L4-5. I then performed a partial intervertebral discectomy at L4-5 using the pituitary forceps. We prepared the vertebral endplates at L4-5 for the fusion by removing the soft tissues with the curettes. We then used the trial spacers to pick the appropriate sized interbody prosthesis. We prefilled his prosthesis with a combination of local morselized autograft bone that we obtained during the decompression as well as Actifuse bone graft extender. We inserted the prefilled prosthesis into the interspace at L4-5. There was a good snug fit of the prosthesis in the interspace. We then filled and the remainder of the intervertebral disc space with local morselized autograft bone and Actifuse. This completed the posterior lumbar interbody arthrodesis.  We now turned attention to the instrumentation. Under fluoroscopic guidance we cannulated the bilateral L4 and L5 pedicles with the bone probe. We then removed the bone probe. We then tapped the pedicle with a 6.5 millimeter tap. We then removed the tap. We probed inside the tapped pedicle  with a ball probe to rule  out cortical breaches. We then inserted a 7.5 x 50 millimeter pedicle screw into the L4 and L5 pedicles bilaterally under fluoroscopic guidance. We then palpated along the medial aspect of the pedicles to rule out cortical breaches. There were none. The nerve roots were not injured. We then connected the unilateral pedicle screws with a lordotic rod. We compressed the construct and secured the rod in place with the caps. We then tightened the caps appropriately. This completed the instrumentation from L4-5.  We now turned our attention to the posterior lateral arthrodesis at L4-5. We used the high-speed drill to decorticate the remainder of the facets, pars, transverse process at L4-5. We then applied a combination of local morselized autograft bone and Vitoss bone graft extender over these decorticated posterior lateral structures. This completed the posterior lateral arthrodesis.  We then obtained hemostasis using bipolar electrocautery. We irrigated the wound out with bacitracin solution. We inspected the thecal sac and nerve roots and noted they were well decompressed. We then removed the retractor. I placed a medium Hemovac drain in the epidural space and tunneled it out through separate stab wound. We reapproximated patient's thoracolumbar fascia with interrupted #1 Vicryl suture. We reapproximated patient's subcutaneous tissue with interrupted 2-0 Vicryl suture. The reapproximated patient's skin with Steri-Strips and benzoin. The wound was then coated with bacitracin ointment. A sterile dressing was applied. The drapes were removed. The patient was subsequently returned to the supine position where they were extubated by the anesthesia team. He was then transported to the post anesthesia care unit in stable condition. All sponge instrument and needle counts were reportedly correct at the end of this case.

## 2012-04-22 LAB — CBC
Hemoglobin: 11.3 g/dL — ABNORMAL LOW (ref 12.0–15.0)
MCH: 27.6 pg (ref 26.0–34.0)
MCHC: 32.8 g/dL (ref 30.0–36.0)
Platelets: 151 10*3/uL (ref 150–400)
RBC: 4.1 MIL/uL (ref 3.87–5.11)

## 2012-04-22 LAB — BASIC METABOLIC PANEL
CO2: 29 mEq/L (ref 19–32)
Calcium: 8.7 mg/dL (ref 8.4–10.5)
GFR calc non Af Amer: 84 mL/min — ABNORMAL LOW (ref >90)
Glucose, Bld: 113 mg/dL — ABNORMAL HIGH (ref 70–99)
Potassium: 4 mEq/L (ref 3.5–5.1)
Sodium: 140 mEq/L (ref 135–145)

## 2012-04-22 MED FILL — Sodium Chloride IV Soln 0.9%: INTRAVENOUS | Qty: 1000 | Status: AC

## 2012-04-22 MED FILL — Heparin Sodium (Porcine) Inj 1000 Unit/ML: INTRAMUSCULAR | Qty: 30 | Status: AC

## 2012-04-22 NOTE — Progress Notes (Signed)
Pt wearing home nasal mask

## 2012-04-22 NOTE — Evaluation (Signed)
Occupational Therapy Evaluation Patient Details Name: Lauren English MRN: [id number] DOB: 05-06-1941 Today's Date: 04/22/2012 Time: 7829-5621 OT Time Calculation (min): 30 min  OT Assessment / Plan / Recommendation Clinical Impression  This 71 yo female s/p back fusion surgery presents to acute OT with problems below. Willl benefit from acute OT with no need for follow up.    OT Assessment  Patient needs continued OT Services    Follow Up Recommendations  No OT follow up    Barriers to Discharge None    Equipment Recommendations  None recommended by OT       Frequency  Min 2X/week    Precautions / Restrictions Precautions Precautions: Back Required Braces or Orthoses: Spinal Brace Spinal Brace: Applied in sitting position Restrictions Weight Bearing Restrictions: No   Pertinent Vitals/Pain 5/10 back    ADL  Eating/Feeding: Simulated;Independent Where Assessed - Eating/Feeding: Edge of bed;Chair Grooming: Performed;Set up Where Assessed - Grooming: Unsupported sitting Upper Body Bathing: Simulated;Set up Where Assessed - Upper Body Bathing: Unsupported sitting Lower Body Bathing: Simulated;Moderate assistance Where Assessed - Lower Body Bathing: Supported sit to stand Upper Body Dressing: Simulated;Set up Where Assessed - Upper Body Dressing: Unsupported sitting Lower Body Dressing: Simulated;Maximal assistance Where Assessed - Lower Body Dressing: Supported sit to stand Toilet Transfer: Performed;Minimal assistance Toilet Transfer Method: Sit to Barista: Comfort height toilet;Grab bars Toileting - Architect and Hygiene: Performed;Supervision/safety Where Assessed - Engineer, mining and Hygiene: Standing Equipment Used: Back brace;Rolling walker;Gait belt Transfers/Ambulation Related to ADLs: Min A for all    OT Diagnosis: Generalized weakness;Acute pain  OT Problem List: Decreased strength;Impaired balance  (sitting and/or standing);Pain;Decreased knowledge of use of DME or AE;Decreased knowledge of precautions OT Treatment Interventions: Self-care/ADL training;DME and/or AE instruction;Patient/family education;Balance training   OT Goals Acute Rehab OT Goals OT Goal Formulation: With patient Time For Goal Achievement: 04/29/12 Potential to Achieve Goals: Good ADL Goals Pt Will Perform Grooming: with set-up;Unsupported;Standing at sink ADL Goal: Grooming - Progress: Goal set today Pt Will Perform Lower Body Bathing: with set-up;with supervision;Unsupported;with adaptive equipment;Sit to stand from chair;Sit to stand from bed;Sitting at sink;Standing at sink ADL Goal: Lower Body Bathing - Progress: Goal set today Pt Will Perform Lower Body Dressing: with set-up;with supervision;Sit to stand from bed;Sit to stand from chair;Unsupported;with adaptive equipment ADL Goal: Lower Body Dressing - Progress: Goal set today Pt Will Transfer to Toilet: with supervision;Ambulation;with DME;Comfort height toilet;Grab bars ADL Goal: Toilet Transfer - Progress: Goal set today Pt Will Perform Tub/Shower Transfer: with supervision;Ambulation;with DME;Shower transfer (3n1) ADL Goal: Web designer - Progress: Goal set today Miscellaneous OT Goals Miscellaneous OT Goal #1: Pt will be able to state and follow back precautions OT Goal: Miscellaneous Goal #1 - Progress: Goal set today Miscellaneous OT Goal #2: Pt will be Mod I in and OOB for BADLs OT Goal: Miscellaneous Goal #2 - Progress: Goal set today Miscellaneous OT Goal #3: Pt will be able to don her brace independently OT Goal: Miscellaneous Goal #3 - Progress: Goal set today  Visit Information  Last OT Received On: 04/22/12 Assistance Needed: +1 PT/OT Co-Evaluation/Treatment: Yes    Subjective Data  Subjective: That wasn't as bad as I thought it was going to be (getting up to the side of the bed) Patient Stated Goal: Did not ask   Prior  Functioning     Home Living Lives With: Spouse;Daughter Available Help at Discharge: Family Type of Home: House Home Access: Stairs to enter [employer]  of Steps: 2 Entrance Stairs-Rails: None Home Layout: Able to live on main level with bedroom/bathroom Bathroom Shower/Tub: Walk-in shower;Door [employer] Toilet: Standard Bathroom Accessibility: Yes How Accessible: Accessible via walker Home Adaptive Equipment: Straight cane;Bedside commode/3-in-1 (toilet riser) Prior Function Level of Independence: Independent Able to Take Stairs?: Reciprically Driving: Yes Vocation: Retired Musician: No difficulties Dominant Hand: Right            Cognition  Overall Cognitive Status: Appears within functional limits for tasks assessed/performed Arousal/Alertness: Awake/alert Orientation Level: Appears intact for tasks assessed Behavior During Session: [facility] for tasks performed    Extremity/Trunk Assessment Right Upper Extremity Assessment RUE ROM/Strength/Tone: [facility] for tasks assessed Left Upper Extremity Assessment LUE ROM/Strength/Tone: WFL for tasks assessed Right Lower Extremity Assessment RLE ROM/Strength/Tone: [facility] for tasks assessed Left Lower Extremity Assessment LLE ROM/Strength/Tone: [facility] for tasks assessed     Mobility Bed Mobility Bed Mobility: Rolling Right;Right Sidelying to Sit;Sitting - Scoot to [place] of Bed Rolling Right: 4: Min guard Right Sidelying to Sit: 4: Min assist Sitting - Scoot to [place] of Bed: 4: Min guard Details for Bed Mobility Assistance: assist for lower extremity initiation Transfers Sit to Stand: 4: Min guard Stand to Sit: 4: Min guard Details for Transfer Assistance: VC's for hand placement and upright posture              End of Session OT - End of Session Equipment Utilized During Treatment: Gait belt;Back brace Activity Tolerance: Patient tolerated treatment well Patient left: in chair;with call bell/phone  within reach;with family/visitor present       Evette Georges [phone] 04/22/2012, 11:32 AM

## 2012-04-22 NOTE — Evaluation (Signed)
Physical Therapy Evaluation Patient Details Name: Lauren English MRN: [id number] DOB: Nov 02, 1940 Today's Date: 04/22/2012 Time: 0865-7846 PT Time Calculation (min): 30 min  PT Assessment / Plan / Recommendation Clinical Impression  Pt is a 71 y.o. female s/p lumbar spine sx.  Pt presents with deficits in functional mobility as stated secondary to pain, weakness, and decreased activity tolerance. Pt will benefit from continued skilled PT to address deficits and maximize independence for discharge.    PT Assessment  Patient needs continued PT services    Follow Up Recommendations  No PT follow up    Does the patient have the potential to tolerate intense rehabilitation      Barriers to Discharge None      Equipment Recommendations  Rolling walker with 5" wheels    Recommendations for Other Services     Frequency Min 5X/week    Precautions / Restrictions Precautions Precautions: Back Required Braces or Orthoses: Spinal Brace Spinal Brace: Applied in sitting position Restrictions Weight Bearing Restrictions: No   Pertinent Vitals/Pain 6/10      Mobility  Bed Mobility Bed Mobility: Rolling Right;Right Sidelying to Sit;Sitting - Scoot to [place] of Bed Rolling Right: 4: Min guard Right Sidelying to Sit: 4: Min assist Sitting - Scoot to [place] of Bed: 4: Min guard Details for Bed Mobility Assistance: assist for lower extremity initiation Transfers Transfers: Sit to Stand;Stand to Sit Sit to Stand: 4: Min guard Stand to Sit: 4: Min guard Details for Transfer Assistance: VC's for hand placement and upright posture Ambulation/Gait Ambulation/Gait Assistance: 4: Min guard Ambulation Distance (Feet): 80 Feet Assistive device: Rolling walker Ambulation/Gait Assistance Details: VC's for upright posture Gait Pattern: Step-through pattern;Decreased stride length;Trunk flexed;Narrow base of support Gait velocity: decreased General Gait Details: pt demonstrates guarded posture  secondary to pain Stairs: No       Exercises General Exercises - Lower Extremity Ankle Circles/Pumps: AROM;Both;20 reps   PT Diagnosis: Difficulty walking;Abnormality of gait;Generalized weakness;Acute pain  PT Problem List: Decreased strength;Decreased activity tolerance;Decreased balance;Decreased mobility;Pain;Decreased range of motion PT Treatment Interventions: DME instruction;Gait training;Stair training;Therapeutic exercise;Functional mobility training;Therapeutic activities;Patient/family education   PT Goals Acute Rehab PT Goals PT Goal Formulation: With patient Time For Goal Achievement: 04/27/12 Potential to Achieve Goals: Good Pt will go Supine/Side to Sit: with modified independence PT Goal: Supine/Side to Sit - Progress: Goal set today Pt will go Sit to Stand: with modified independence PT Goal: Sit to Stand - Progress: Goal set today Pt will Ambulate: >150 feet;with modified independence;with rolling walker PT Goal: Ambulate - Progress: Goal set today Pt will Go Up / Down Stairs: 1-2 stairs;with least restrictive assistive device;with min assist PT Goal: Up/Down Stairs - Progress: Goal set today  Visit Information  Last PT Received On: 04/22/12 Assistance Needed: +1    Subjective Data  Subjective: i'm doing okay Patient Stated Goal: to go home   Prior Functioning  Home Living Lives With: Spouse;Daughter Available Help at Discharge: Family Type of Home: House Home Access: Stairs to enter Secretary/administrator of Steps: 2 Entrance Stairs-Rails: None Home Layout: Able to live on main level with bedroom/bathroom Bathroom Shower/Tub: Walk-in shower;Door [employer] Toilet: Standard Bathroom Accessibility: Yes How Accessible: Accessible via walker Home Adaptive Equipment: Straight cane;Bedside commode/3-in-1 Prior Function Level of Independence: Independent Able to Take Stairs?: Reciprically Driving: Yes Vocation: Retired Musician: No  difficulties Dominant Hand: Right    Cognition  Overall Cognitive Status: Appears within functional limits for tasks assessed/performed Arousal/Alertness: Awake/alert Orientation Level: Appears intact for  tasks assessed Behavior During Session: [facility] for tasks performed    Extremity/Trunk Assessment Right Upper Extremity Assessment RUE ROM/Strength/Tone: [facility] for tasks assessed Left Upper Extremity Assessment LUE ROM/Strength/Tone: [facility] for tasks assessed Right Lower Extremity Assessment RLE ROM/Strength/Tone: [facility] for tasks assessed Left Lower Extremity Assessment LLE ROM/Strength/Tone: [facility] for tasks assessed   Balance Balance Balance Assessed: Yes High Level Balance High Level Balance Activites: Side stepping;Backward walking;Direction changes;Turns;Head turns High Level Balance Comments: pt steady with balance tasks  End of Session PT - End of Session Equipment Utilized During Treatment: Gait belt;Back brace Activity Tolerance: Patient tolerated treatment well;Patient limited by fatigue;Patient limited by pain Patient left: in chair;with call bell/phone within reach;with family/visitor present Nurse Communication: Mobility status  GP     Fabio Asa 04/22/2012, 3:20 PM Charlotte Crumb, PT DPT  [phone]

## 2012-04-22 NOTE — Progress Notes (Signed)
Orthopedic Tech Progress Note Patient Details:  Lauren English 1940-08-07 [id number] Spoke with bedside nurse in person on unit and nurse stated that patient has brace and is currently wearing it.  Patient ID: Lauren English, female   DOB: 10-Dec-1940, 71 y.o.   MRN: [id number]   [name] 04/22/2012, 12:27 PM

## 2012-04-22 NOTE — Progress Notes (Signed)
Pt complaining of headache 7/10. Pt layed flat in bed. Headache resolved after 5 minutes. md notified, stated to continue to monitor. No other orders.

## 2012-04-22 NOTE — Progress Notes (Signed)
Patient ID: Lauren English, female   DOB: [birth year], 71 y.o.   MRN: [id number] Subjective:  The patient is alert and pleasant. She looks well. She has no complaints.  Objective: Vital signs in last 24 hours: Temp:  [97.6 F (36.4 C)-98.7 F (37.1 C)] 98.2 F (36.8 C) (12/13 0600) Pulse Rate:  [61-75] 65  (12/13 0600) Resp:  [12-18] 18  (12/13 0600) BP: (125-145)/(45-69) 125/51 mmHg (12/13 0600) SpO2:  [95 %-100 %] 99 % (12/13 0600) Weight:  [100.3 kg (221 lb 1.9 oz)] 100.3 kg (221 lb 1.9 oz) (12/12 1443)  Intake/Output from previous day: 12/12 0701 - 12/13 0700 In: 4400 [P.O.:900; I.V.:3400; IV Piggyback:100] Out: 4710 [Urine:4335; Drains:150; Blood:225] Intake/Output this shift:    Physical exam the patient is alert and oriented. Her strength is normal in her lower extremities.  Lab Results:  [facility] 04/22/12 0628  WBC 7.0  HGB 11.3*  HCT 34.5*  PLT 151   BMET  Basename 04/22/12 0628  NA 140  K 4.0  CL 104  CO2 29  GLUCOSE 113*  BUN 10  CREATININE 0.78  CALCIUM 8.7    Studies/Results: Dg Lumbar Spine 2-3 Views  04/21/2012  *RADIOLOGY REPORT*  Clinical Data: Back pain  LUMBAR SPINE - 2-3 VIEW,DG C-ARM 61-120 MIN  Comparison: Multiple priors.  Findings: Due to technical difficulties, images could not be sent to PACS.  Images were printed and scanned into the EMR.  C-arm films document posterolateral and interbody fusion L4-L5 using pedicle screw and interbody cages.  Grossly satisfactory position and alignment.  IMPRESSION: As above.   Original Report Authenticated By: Davonna Belling, M.D.    Dg Lumbar Spine 1 View  04/21/2012  *RADIOLOGY REPORT*  Clinical Data: Lumbar spine fusion.  LUMBAR SPINE - 1 VIEW  Comparison: Lumbar spine MRI 12/17/2011.  Findings: Lateral lumbar spine film from the operating room demonstrates a surgical instrument marking the L4-5 disc space.  IMPRESSION: L4-5 marked intraoperatively.   Original Report Authenticated By: Rudie Meyer, M.D.     Dg C-arm 61-120 Min  04/21/2012  *RADIOLOGY REPORT*  Clinical Data: Back pain  LUMBAR SPINE - 2-3 VIEW,DG C-ARM 61-120 MIN  Comparison: Multiple priors.  Findings: Due to technical difficulties, images could not be sent to PACS.  Images were printed and scanned into the EMR.  C-arm films document posterolateral and interbody fusion L4-L5 using pedicle screw and interbody cages.  Grossly satisfactory position and alignment.  IMPRESSION: As above.   Original Report Authenticated By: Davonna Belling, M.D.     Assessment/Plan: Postop day #1: The patient is doing well. She will likely be discharged either tomorrow or Sunday. I gave her her discharge instructions and answered all her questions.  LOS: 1 day     Severo Beber D 04/22/2012, 8:12 AM

## 2012-04-22 NOTE — Clinical Social Work Note (Signed)
Clinical Social Work   CSW reviewed chart. PT is note recommending any follow up. CSW will alert RNCM. CSW is signing off as no further needs identified. Please reconsult in a need arises prior to discharge.   Dede Query, MSW, Theresia Majors [phone]

## 2012-04-22 NOTE — Progress Notes (Signed)
Upon entry to pt's room. Pt asleep and resting on cpap

## 2012-04-23 MED ORDER — OXYCODONE HCL 5 MG PO TABS: 15.0000 mg | ORAL_TABLET | ORAL | Status: DC | PRN

## 2012-04-23 MED ORDER — MORPHINE SULFATE 2 MG/ML IJ SOLN: 2.0000 mg | INTRAMUSCULAR | Status: DC | PRN

## 2012-04-23 MED ORDER — FLEET ENEMA 7-19 GM/118ML RE ENEM
1.0000 | ENEMA | Freq: Once | RECTAL | Status: AC
  Administered 2012-04-23: 1 via RECTAL
  Filled 2012-04-23: qty 1

## 2012-04-23 NOTE — Progress Notes (Signed)
Occupational Therapy Treatment Patient Details Name: Lauren English MRN: [id number] DOB: 04-27-1941 Today's Date: 04/23/2012 Time: 4098-1191 OT Time Calculation (min): 23 min  OT Assessment / Plan / Recommendation Comments on Treatment Session Pt making great progress. Could benefit from practicing walk in shower transfer before d/c from OT services.    Follow Up Recommendations  No OT follow up    Barriers to Discharge       Equipment Recommendations  None recommended by OT    Recommendations for Other Services    Frequency Min 2X/week   Plan Discharge plan remains appropriate    Precautions / Restrictions Precautions Precautions: Back Precaution Comments: Pt and family able to verbalize 3/3 back precautions. Required Braces or Orthoses: Spinal Brace Spinal Brace: Applied in sitting position   Pertinent Vitals/Pain See vitals    ADL  Grooming: Performed;Wash/dry hands;Supervision/safety Where Assessed - Grooming: Unsupported standing Lower Body Bathing: Simulated;Minimal assistance Where Assessed - Lower Body Bathing: Unsupported sitting Lower Body Dressing: Performed;Minimal assistance Where Assessed - Lower Body Dressing: Unsupported sitting Toilet Transfer: Simulated;Min guard Toilet Transfer Method: Sit to Barista:  (bed) Equipment Used: Back brace;Rolling walker Transfers/Ambulation Related to ADLs: min guard with RW ADL Comments: Pt and family demonstrate good understanding of back precautions. Pt able to cross left ankle over right knee to don/doff left sock but some difficulty with right sock.  Pt reports that she will have daughter and husband to assist with LB dressing/bathing tasks as well, and family agreeable and able.  Educated pt on use of toilet aid (or using open faced tongs) for toileting hygiene tasks due to pt reporting some difficulty being able to perform task thoroughly.  Pt verbalized understanding of toilet aid and  appreciative of information.    Pt washing hands at sink with RN upon OT arrival and wishing to return to bed.  Provided education while pt sat EOB.  Educated pt on walk in shower transfer and using 3n1 as shower chair but pt could benefit from practicing walk in shower transfer next session.      OT Diagnosis:    OT Problem List:   OT Treatment Interventions:     OT Goals ADL Goals Pt Will Perform Grooming: with set-up;Unsupported;Standing at sink ADL Goal: Grooming - Progress: Met Pt Will Perform Lower Body Bathing: with set-up;with supervision;Unsupported;with adaptive equipment;Sit to stand from chair;Sit to stand from bed;Sitting at sink;Standing at sink ADL Goal: Lower Body Bathing - Progress: Discontinued (comment) (pt and family verbalizing that family will assist ) Pt Will Perform Lower Body Dressing: with set-up;with supervision;Sit to stand from bed;Sit to stand from chair;Unsupported;with adaptive equipment ADL Goal: Lower Body Dressing - Progress: Discontinued (comment) (pt and family verbalizing that family will assist) Pt Will Transfer to Toilet: with supervision;Ambulation;with DME;Comfort height toilet;Grab bars ADL Goal: Toilet Transfer - Progress: Progressing toward goals Pt Will Perform Tub/Shower Transfer: with supervision;Ambulation;with DME;Shower transfer ADL Goal: Web designer - Progress: Progressing toward goals Miscellaneous OT Goals Miscellaneous OT Goal #1: Pt will be able to state and follow back precautions OT Goal: Miscellaneous Goal #1 - Progress: Progressing toward goals Miscellaneous OT Goal #2: Pt will be Mod I in and OOB for BADLs OT Goal: Miscellaneous Goal #2 - Progress: Progressing toward goals  Visit Information  Last OT Received On: 04/23/12 Assistance Needed: +1    Subjective Data      Prior Functioning       Cognition  Overall Cognitive Status: Appears within functional limits for  tasks assessed/performed Arousal/Alertness:  Awake/alert Orientation Level: Appears intact for tasks assessed Behavior During Session: [facility] for tasks performed    Mobility  Shoulder Instructions Bed Mobility Bed Mobility: Sit to Sidelying Left Sit to Sidelying Left: 4: Min guard;HOB flat Details for Bed Mobility Assistance: min guard assist at trunk to ensure pt did not twist.  Transfers Transfers: Stand to Sit Stand to Sit: 5: Supervision;To bed;With upper extremity assist Details for Transfer Assistance: Supervision for safety. Pt already standing with RN upon OT arrival.       Exercises      Balance     End of Session OT - End of Session Equipment Utilized During Treatment: Gait belt;Back brace Activity Tolerance: Patient tolerated treatment well Patient left: in bed;with call bell/phone within reach;with family/visitor present Nurse Communication: Mobility status  GO    04/23/2012 Cipriano Mile OTR/L Pager [phone] Office [phone]  Cipriano Mile 04/23/2012, 9:02 AM

## 2012-04-23 NOTE — Progress Notes (Signed)
Patient ID: Lauren English, female   DOB: 14-Mar-1941, 71 y.o.   MRN: [id number] Decrease of pain, no weakness. Ambulating with help. Wound dry

## 2012-04-23 NOTE — Progress Notes (Signed)
Physical Therapy Treatment Patient Details Name: Lauren English MRN: [id number] DOB: Feb 07, 1941 Today's Date: 04/23/2012 Time: 4098-1191 PT Time Calculation (min): 32 min  PT Assessment / Plan / Recommendation Comments on Treatment Session  71 y.o. female admitted to [facility] for L4-5 decompression, instrumentation, and fusion.  She presents today with increased independence with her mobility, increased gait distance and increased balance during functional tasks.  I educated her on functional examples of back precautions, and answered questions about when she can return to her home stretches.      Follow Up Recommendations  No PT follow up     Does the patient have the potential to tolerate intense rehabilitation   NA  Barriers to Discharge  none      Equipment Recommendations  Rolling walker with 5" wheels    Recommendations for Other Services  none  Frequency Min 5X/week   Plan Discharge plan remains appropriate;Frequency remains appropriate    Precautions / Restrictions Precautions Precautions: Back Precaution Comments: Pt and family able to verbalize 3/3 back precautions. Required Braces or Orthoses: Spinal Brace Spinal Brace: Applied in sitting position   Pertinent Vitals/Pain 5/10 low back pain, repositioned and walked- pt reported decreased pain after    Mobility  Bed Mobility Bed Mobility: Sit to Sidelying Left Rolling Left: 6: Modified independent (Device/Increase time);With rail Left Sidelying to Sit: 6: Modified independent (Device/Increase time);With rails;HOB flat Sitting - Scoot to Edge of Bed: 4: Min assist Sit to Sidelying Left: 4: Min guard;HOB flat Details for Bed Mobility Assistance: heavy reliance on railing for support.  Min assist to help scoot out on compliant bed.  Transfers Sit to Stand: 4: Min guard;From elevated surface;From bed;With upper extremity assist;From chair/3-in-1 Stand to Sit: 4: Min guard;With upper extremity assist;With armrests;To  bed;To chair/3-in-1 Details for Transfer Assistance: verbal cues for safe hand placement assist for steadying pt during transitional movement.   Ambulation/Gait Ambulation/Gait Assistance: 4: Min guard Ambulation Distance (Feet): 200 Feet Assistive device: Rolling walker Ambulation/Gait Assistance Details: verbal cues for upright posture Gait Pattern: Step-through pattern;Trunk flexed    Exercises General Exercises - Lower Extremity Ankle Circles/Pumps: AROM;Both;20 reps    PT Goals Acute Rehab PT Goals Pt will go Supine/Side to Sit: with modified independence;with HOB 0 degrees (no railing) PT Goal: Supine/Side to Sit - Progress: Updated due to goal met PT Goal: Sit to Stand - Progress: Progressing toward goal PT Goal: Ambulate - Progress: Progressing toward goal  Visit Information  Last PT Received On: 04/23/12 Assistance Needed: +1    Subjective Data  Subjective: Pt reports she felt better after getting up and moving.   Patient Stated Goal: to go home   Cognition  Overall Cognitive Status: Appears within functional limits for tasks assessed/performed Arousal/Alertness: Awake/alert Orientation Level: Appears intact for tasks assessed Behavior During Session: [facility] for tasks performed    Balance  Dynamic Standing Balance Dynamic Standing - Balance Support: No upper extremity supported;During functional activity Dynamic Standing - Level of Assistance: 4: Min assist Dynamic Standing - Balance Activities: Reaching for objects Dynamic Standing - Comments: min assist to stedy pt while she performed her own hygiene after toileting and while washing hands reaching for soap and towels at the sink.    End of Session PT - End of Session Equipment Utilized During Treatment: Back brace Activity Tolerance: Patient limited by fatigue;Patient limited by pain Patient left: in chair;with call bell/phone within reach;with family/visitor present    [name] B. Perfecto Purdy, PT, DPT  [phone]  04/23/2012, 10:39 AM

## 2012-04-24 LAB — URINALYSIS, ROUTINE W REFLEX MICROSCOPIC
Protein, ur: NEGATIVE mg/dL
Specific Gravity, Urine: 1.008 (ref 1.005–1.030)
Urobilinogen, UA: 0.2 mg/dL (ref 0.0–1.0)

## 2012-04-24 LAB — URINE MICROSCOPIC-ADD ON

## 2012-04-24 NOTE — Progress Notes (Signed)
Subjective: Patient reports doing well.  Some dysuria.  Objective: Vital signs in last 24 hours: Temp:  [97.4 F (36.3 C)-100.3 F (37.9 C)] 98.9 F (37.2 C) (12/15 0600) Pulse Rate:  [72-93] 81  (12/15 0600) Resp:  [16-18] 18  (12/15 0600) BP: (128-140)/(50-61) 135/56 mmHg (12/15 0600) SpO2:  [93 %-99 %] 95 % (12/15 0600)  Intake/Output from previous day: 12/14 0701 - 12/15 0700 In: 480 [P.O.:480] Out: 20 [Drains:20] Intake/Output this shift:    Physical Exam: Full strength both legs.  Dressing CDI  Lab Results:  [facility] 04/22/12 0628  WBC 7.0  HGB 11.3*  HCT 34.5*  PLT 151   BMET  Basename 04/22/12 0628  NA 140  K 4.0  CL 104  CO2 29  GLUCOSE 113*  BUN 10  CREATININE 0.78  CALCIUM 8.7    Studies/Results: No results found.  Assessment/Plan: Drain output diminishing.  Will D/C.  Send U/A for c/o dysuria.  Continue to mobilize.  Possible D/C home in am.     LOS: 3 days    Dorian Heckle, MD 04/24/2012, 7:59 AM

## 2012-04-24 NOTE — Progress Notes (Signed)
Physical Therapy Treatment Patient Details Name: Lauren English MRN: [id number] DOB: Sep 09, 1940 Today's Date: 04/24/2012 Time: 4696-2952 PT Time Calculation (min): 26 min  PT Assessment / Plan / Recommendation Comments on Treatment Session  Pt moving well at this date.  Performed stairs this session & did well.      Follow Up Recommendations  No PT follow up     Does the patient have the potential to tolerate intense rehabilitation     Barriers to Discharge        Equipment Recommendations  Rolling walker with 5" wheels    Recommendations for Other Services    Frequency Min 5X/week   Plan Discharge plan remains appropriate;Frequency remains appropriate    Precautions / Restrictions Precautions Precautions: Back Required Braces or Orthoses: Spinal Brace Spinal Brace: Applied in sitting position   Pertinent Vitals/Pain 3/10 back.  Premedicated.      Mobility  Bed Mobility Bed Mobility: Left Sidelying to Sit;Sitting - Scoot to Edge of Bed Left Sidelying to Sit: 6: Modified independent (Device/Increase time);With rails Sitting - Scoot to Edge of Bed: 6: Modified independent (Device/Increase time) Details for Bed Mobility Assistance: Pt lying in sidelying position upon arrival.   Transfers Transfers: Sit to Stand;Stand to Sit Sit to Stand: 4: Min guard;With upper extremity assist;From bed;From chair/3-in-1;With armrests Stand to Sit: 4: Min guard;With upper extremity assist;With armrests;To chair/3-in-1 Details for Transfer Assistance: Cues to reinforce safe hand placement  Ambulation/Gait Ambulation/Gait Assistance: 4: Min guard Ambulation Distance (Feet): 150 Feet Assistive device: Rolling walker Ambulation/Gait Assistance Details: Cues for tall posture.   Gait Pattern: Step-through pattern;Decreased stride length;Trunk flexed Gait velocity: decreased Stairs: Yes Stairs Assistance: 4: Min assist Stairs Assistance Details (indicate cue type and reason): Pt utilized  rail on Lt side to mimic wall she can stabilize herself on at home + R UE HHA.  (A) for balance & safety.  Pt's husband present & was educated on how to (A) as well as pt & husband practiced together.   Stair Management Technique: One rail Left;Forwards;Step to pattern;Other (comment) (Rt UE HHA)      PT Goals Acute Rehab PT Goals Time For Goal Achievement: 04/27/12 Potential to Achieve Goals: Good Pt will go Supine/Side to Sit: with modified independence;with HOB 0 degrees PT Goal: Supine/Side to Sit - Progress: Progressing toward goal Pt will go Sit to Stand: with modified independence PT Goal: Sit to Stand - Progress: Progressing toward goal Pt will Ambulate: >150 feet;with modified independence;with rolling walker PT Goal: Ambulate - Progress: Progressing toward goal Pt will Go Up / Down Stairs: 1-2 stairs;with least restrictive assistive device;with min assist PT Goal: Up/Down Stairs - Progress: Progressing toward goal  Visit Information  Last PT Received On: 04/24/12 Assistance Needed: +1    Subjective Data      Cognition  Overall Cognitive Status: Appears within functional limits for tasks assessed/performed Arousal/Alertness: Awake/alert Orientation Level: Appears intact for tasks assessed Behavior During Session: [facility] for tasks performed    Balance     End of Session PT - End of Session Equipment Utilized During Treatment: Gait belt;Back brace Activity Tolerance: Patient tolerated treatment well Patient left: in chair;with call bell/phone within reach;with family/visitor present Nurse Communication: Mobility status     Verdell Face, [place] [phone] 04/24/2012

## 2012-04-25 MED ORDER — HYDROCODONE-ACETAMINOPHEN 10-325 MG PO TABS: 1.0000 | ORAL_TABLET | ORAL | Status: DC | PRN

## 2012-04-25 MED ORDER — DIAZEPAM 5 MG PO TABS: 5.0000 mg | ORAL_TABLET | Freq: Four times a day (QID) | ORAL | Status: DC | PRN

## 2012-04-25 NOTE — Care Management Note (Signed)
    Page 1 of 1   04/25/2012     11:58:52 AM   CARE MANAGEMENT NOTE 04/25/2012  Patient:  Lauren English, Lauren English   Account Number:  [id number]  Date Initiated:  04/25/2012  Documentation initiated by:  Jacquelynn Cree  Subjective/Objective Assessment:   Admitted postop L4-5 PLIF     Action/Plan:   PT/OT evals-recommended HHPT and rolling walker   Anticipated DC Date:  04/25/2012   Anticipated DC Plan:  HOME W HOME HEALTH SERVICES      DC Planning Services  CM consult      Choice offered to / List presented to:  C-1 Patient        HH arranged  HH-2 PT      [facility]  [facility]   Status of service:  Completed, signed off Medicare Important Message given?   (If response is "NO", the following Medicare IM given date fields Lauren English be blank) Date Medicare IM given:   Date Additional Medicare IM given:    Discharge Disposition:  HOME W HOME HEALTH SERVICES  Per UR Regulation:  Reviewed for med. necessity/level of care/duration of stay  If discussed at Long Length of Stay Meetings, dates discussed:    Comments:  04/25/12 PT recommending HHPT and rolling walker. Spoke with patient about HHC, she chose [facility] from the [facility] agenies list. Patient states that she already has a walker. Contacted Amarianna Abplanalp at [facility] and requested HHPT. Jacquelynn Cree RN, BSN, CCM

## 2012-04-25 NOTE — Discharge Summary (Signed)
Physician Discharge Summary  Patient ID: Lauren English MRN: [id number] DOB/AGE: 1941-02-07 71 y.o.  Admit date: 04/21/2012 Discharge date: 04/25/2012  Admission Diagnoses: L4-5 spondylolisthesis, spinal stenosis, lumbar radiculopathy, neurogenic claudication, lumbago  Discharge Diagnoses: The same Active Problems:  * No active hospital problems. *    Discharged Condition: good  Hospital Course: I admitted the patient to [facility] on 04/21/2012. On that day I performed an L4-5 decompression, agitation, and fusion. The surgery went well.  The patient's postoperative course was unremarkable she was discharged home on postop day #4. The patient was given oral and written discharge instructions. All her questions were answered.  Consults: None Significant Diagnostic Studies: None Treatments: L4-5 decompression, instrumentation, and fusion. Discharge Exam: Blood pressure 139/51, pulse 75, temperature 99.1 F (37.3 C), temperature source Oral, resp. rate 17, height 5\' 10"  (1.778 m), weight 100.3 kg (221 lb 1.9 oz), SpO2 95.00%. The patient is alert and oriented. Her strength is grossly normal in her lower extremities. Her dressing is clean and dry.  Disposition: Home  Discharge Orders    Future Orders Please Complete By Expires   Diet - low sodium heart healthy      Increase activity slowly      Discharge instructions      Comments:   Call [phone] for a followup appointment.   No dressing needed      Call MD for:  temperature >100.4      Call MD for:  persistant nausea and vomiting      Call MD for:  severe uncontrolled pain      Call MD for:  redness, tenderness, or signs of infection (pain, swelling, redness, odor or green/yellow discharge around incision site)      Call MD for:  difficulty breathing, headache or visual disturbances      Call MD for:  hives      Call MD for:  persistant dizziness or light-headedness      Call MD for:  extreme fatigue           Medication List     As of 04/25/2012 11:38 AM    STOP taking these medications         Acetyl L-Carnitine 500 MG Caps      ciprofloxacin 500 MG tablet   Commonly known as: CIPRO      TAKE these medications         CALCIUM CITRATE PLUS PO   Take 500 mg by mouth 2 (two) times daily. With 800 i.u. Vitamin d3, 10 mg vitamin b-6, 80 mg magnesium, 10 mg zinc, 1 mg copper, 1 mg manganese, 5 mg sodium, 1 mg boron      cholecalciferol 1000 UNITS tablet   Commonly known as: VITAMIN D   Take 1,000 Units by mouth 2 (two) times daily.      CRANBERRY PO   Take 870 mg by mouth 2 (two) times daily.      diazepam 5 MG tablet   Commonly known as: VALIUM   Take 1 tablet (5 mg total) by mouth every 6 (six) hours as needed.      diclofenac sodium 1 % Gel   Commonly known as: VOLTAREN   Apply 1 g topically daily as needed. For toe pain      docusate sodium 100 MG capsule   Commonly known as: COLACE   Take 100 mg by mouth 2 (two) times daily.      Glucosamine-Chondroitin 750-600 MG Tabs  Take 1 tablet by mouth 2 (two) times daily.      HYDROcodone-acetaminophen 10-325 MG per tablet   Commonly known as: NORCO   Take 1 tablet by mouth 3 (three) times daily.      HYDROcodone-acetaminophen 10-325 MG per tablet   Commonly known as: NORCO   Take 1 tablet by mouth every 4 (four) hours as needed for pain.      Krill Oil 300 MG Caps   Take 300 mg by mouth daily.      oxybutynin 5 MG tablet   Commonly known as: DITROPAN   Take 5 mg by mouth every evening.      TURMERIC CURCUMIN PO   Take 250 mg by mouth every morning.         SignedCristi Loron 04/25/2012, 11:38 AM

## 2012-04-25 NOTE — Progress Notes (Signed)
Discharge orders received.  Pt education completed.  Pt has a rolling walker that she borrowed from a friend and will be using at home so she has no other needs.   Pt will be discharged home with husband

## 2012-04-25 NOTE — Progress Notes (Signed)
Subjective: Patient reports "I feel much better than I did. I think I'm ready to go home."  Objective: Vital signs in last 24 hours: Temp:  [98.6 F (37 C)-99.5 F (37.5 C)] 99.5 F (37.5 C) (12/16 0600) Pulse Rate:  [69-82] 78  (12/16 0600) Resp:  [18] 18  (12/16 0600) BP: (118-143)/(50-54) 118/50 mmHg (12/16 0600) SpO2:  [94 %-99 %] 98 % (12/16 0600)  Intake/Output from previous day: 12/15 0701 - 12/16 0700 In: 720 [P.O.:720] Out: -  Intake/Output this shift:    Alert, conversant, smiling. Currently ambulating with PT, using LSO.  Hemovac out yesterday.    Lab Results: No results found for this basename: WBC:2,HGB:2,HCT:2,PLT:2 in the last 72 hours BMET No results found for this basename: NA:2,K:2,CL:2,CO2:2,GLUCOSE:2,BUN:2,CREATININE:2,CALCIUM:2 in the last 72 hours  Studies/Results: No results found.  Assessment/Plan: Improving  LOS: 4 days  Will d/w Dr. Venetia Maxon / Dr. Lovell Sheehan possible d/c to home today.   Georgiann Cocker 04/25/2012, 8:46 AM

## 2012-04-25 NOTE — Progress Notes (Signed)
Drain removed yesterday per my order.  Patient waiting on Dr. Lovell Sheehan to be discharged.

## 2012-04-25 NOTE — Progress Notes (Addendum)
Physical Therapy Treatment Patient Details Name: Lauren English MRN: [id number] DOB: 1940-12-09 Today's Date: 04/25/2012 Time: 4098-1191 PT Time Calculation (min): 28 min  PT Assessment / Plan / Recommendation Comments on Treatment Session  Pt cont's to move well & progress with PT goals.  Pt & family are requesting HHPT f/u, therefore d/c plans have been updated.      Follow Up Recommendations  Home health PT     Does the patient have the potential to tolerate intense rehabilitation     Barriers to Discharge        Equipment Recommendations  Rolling walker with 5" wheels    Recommendations for Other Services    Frequency Min 5X/week   Plan Discharge plan needs to be updated    Precautions / Restrictions Precautions Precautions: Back Precaution Comments: Pt and family able to verbalize 3/3 back precautions. Required Braces or Orthoses: Spinal Brace Spinal Brace: Applied in sitting position   Pertinent Vitals/Pain 4/10  Back.      Mobility  Bed Mobility Bed Mobility: Sit to Sidelying Left Left Sidelying to Sit: 6: Modified independent (Device/Increase time);With rails Details for Bed Mobility Assistance: Educated pt & family on how to get into elevated bed using step.  Performed 3x's.    Transfers Transfers: Sit to Stand;Stand to Sit Sit to Stand: 5: Supervision;With upper extremity assist;With armrests;From bed;From chair/3-in-1 Stand to Sit: 5: Supervision;With upper extremity assist;To bed Details for Transfer Assistance: Cues to reinforce safest hand placement Ambulation/Gait Ambulation/Gait Assistance: 5: Supervision Ambulation Distance (Feet): 400 Feet Assistive device: Rolling walker Ambulation/Gait Assistance Details: Cont's to require occassional cueing for increased upright posture.   Gait Pattern: Step-through pattern;Decreased stride length (mild trunk flexion)      PT Goals Acute Rehab PT Goals Time For Goal Achievement: 04/27/12 Potential to  Achieve Goals: Good Pt will go Supine/Side to Sit: with modified independence;with HOB 0 degrees Pt will go Sit to Stand: with modified independence PT Goal: Sit to Stand - Progress: Progressing toward goal Pt will Ambulate: >150 feet;with modified independence;with rolling walker PT Goal: Ambulate - Progress: Progressing toward goal Pt will Go Up / Down Stairs: 1-2 stairs;with least restrictive assistive device;with min assist  Visit Information  Last PT Received On: 04/25/12 Assistance Needed: +1    Subjective Data  Subjective: "Im ready to go home" Patient Stated Goal: to go home   Cognition  Overall Cognitive Status: Appears within functional limits for tasks assessed/performed Arousal/Alertness: Awake/alert Orientation Level: Appears intact for tasks assessed Behavior During Session: [facility] for tasks performed    Balance     End of Session PT - End of Session Equipment Utilized During Treatment: Gait belt;Back brace Activity Tolerance: Patient tolerated treatment well Patient left: in bed;with call bell/phone within reach;with family/visitor present Nurse Communication: Mobility status;Other (comment) (f/u plans for HHPT.  )     Verdell Face, PTA [phone] 04/25/2012

## 2012-04-26 LAB — URINE CULTURE

## 2012-04-26 NOTE — Progress Notes (Signed)
Reviewed and agree with plan of care. Charlotte Crumb, PT DPT  [phone]

## 2012-04-27 DIAGNOSIS — IMO0002 Reserved for concepts with insufficient information to code with codable children: Secondary | ICD-10-CM | POA: Diagnosis not present

## 2012-04-27 DIAGNOSIS — M545 Low back pain, unspecified: Secondary | ICD-10-CM | POA: Diagnosis not present

## 2012-04-27 DIAGNOSIS — M48061 Spinal stenosis, lumbar region without neurogenic claudication: Secondary | ICD-10-CM | POA: Diagnosis not present

## 2012-04-27 DIAGNOSIS — M4310 Spondylolisthesis, site unspecified: Secondary | ICD-10-CM | POA: Diagnosis not present

## 2012-04-27 DIAGNOSIS — M519 Unspecified thoracic, thoracolumbar and lumbosacral intervertebral disc disorder: Secondary | ICD-10-CM | POA: Diagnosis not present

## 2012-04-27 DIAGNOSIS — IMO0001 Reserved for inherently not codable concepts without codable children: Secondary | ICD-10-CM | POA: Diagnosis not present

## 2012-04-29 DIAGNOSIS — IMO0002 Reserved for concepts with insufficient information to code with codable children: Secondary | ICD-10-CM | POA: Diagnosis not present

## 2012-04-29 DIAGNOSIS — M545 Low back pain, unspecified: Secondary | ICD-10-CM | POA: Diagnosis not present

## 2012-04-29 DIAGNOSIS — M519 Unspecified thoracic, thoracolumbar and lumbosacral intervertebral disc disorder: Secondary | ICD-10-CM | POA: Diagnosis not present

## 2012-04-29 DIAGNOSIS — M48061 Spinal stenosis, lumbar region without neurogenic claudication: Secondary | ICD-10-CM | POA: Diagnosis not present

## 2012-04-29 DIAGNOSIS — IMO0001 Reserved for inherently not codable concepts without codable children: Secondary | ICD-10-CM | POA: Diagnosis not present

## 2012-04-29 DIAGNOSIS — M4310 Spondylolisthesis, site unspecified: Secondary | ICD-10-CM | POA: Diagnosis not present

## 2012-05-01 DIAGNOSIS — M48061 Spinal stenosis, lumbar region without neurogenic claudication: Secondary | ICD-10-CM | POA: Diagnosis not present

## 2012-05-01 DIAGNOSIS — M519 Unspecified thoracic, thoracolumbar and lumbosacral intervertebral disc disorder: Secondary | ICD-10-CM | POA: Diagnosis not present

## 2012-05-01 DIAGNOSIS — IMO0001 Reserved for inherently not codable concepts without codable children: Secondary | ICD-10-CM | POA: Diagnosis not present

## 2012-05-01 DIAGNOSIS — IMO0002 Reserved for concepts with insufficient information to code with codable children: Secondary | ICD-10-CM | POA: Diagnosis not present

## 2012-05-01 DIAGNOSIS — M545 Low back pain, unspecified: Secondary | ICD-10-CM | POA: Diagnosis not present

## 2012-05-01 DIAGNOSIS — M4310 Spondylolisthesis, site unspecified: Secondary | ICD-10-CM | POA: Diagnosis not present

## 2012-05-04 DIAGNOSIS — M545 Low back pain, unspecified: Secondary | ICD-10-CM | POA: Diagnosis not present

## 2012-05-04 DIAGNOSIS — M48061 Spinal stenosis, lumbar region without neurogenic claudication: Secondary | ICD-10-CM | POA: Diagnosis not present

## 2012-05-04 DIAGNOSIS — IMO0001 Reserved for inherently not codable concepts without codable children: Secondary | ICD-10-CM | POA: Diagnosis not present

## 2012-05-04 DIAGNOSIS — M519 Unspecified thoracic, thoracolumbar and lumbosacral intervertebral disc disorder: Secondary | ICD-10-CM | POA: Diagnosis not present

## 2012-05-04 DIAGNOSIS — M4310 Spondylolisthesis, site unspecified: Secondary | ICD-10-CM | POA: Diagnosis not present

## 2012-05-04 DIAGNOSIS — IMO0002 Reserved for concepts with insufficient information to code with codable children: Secondary | ICD-10-CM | POA: Diagnosis not present

## 2012-05-09 DIAGNOSIS — IMO0002 Reserved for concepts with insufficient information to code with codable children: Secondary | ICD-10-CM | POA: Diagnosis not present

## 2012-05-09 DIAGNOSIS — IMO0001 Reserved for inherently not codable concepts without codable children: Secondary | ICD-10-CM | POA: Diagnosis not present

## 2012-05-09 DIAGNOSIS — M519 Unspecified thoracic, thoracolumbar and lumbosacral intervertebral disc disorder: Secondary | ICD-10-CM | POA: Diagnosis not present

## 2012-05-09 DIAGNOSIS — M545 Low back pain, unspecified: Secondary | ICD-10-CM | POA: Diagnosis not present

## 2012-05-09 DIAGNOSIS — M4310 Spondylolisthesis, site unspecified: Secondary | ICD-10-CM | POA: Diagnosis not present

## 2012-05-09 DIAGNOSIS — M48061 Spinal stenosis, lumbar region without neurogenic claudication: Secondary | ICD-10-CM | POA: Diagnosis not present

## 2012-05-12 DIAGNOSIS — M545 Low back pain, unspecified: Secondary | ICD-10-CM | POA: Diagnosis not present

## 2012-05-12 DIAGNOSIS — IMO0001 Reserved for inherently not codable concepts without codable children: Secondary | ICD-10-CM | POA: Diagnosis not present

## 2012-05-12 DIAGNOSIS — M519 Unspecified thoracic, thoracolumbar and lumbosacral intervertebral disc disorder: Secondary | ICD-10-CM | POA: Diagnosis not present

## 2012-05-12 DIAGNOSIS — M4310 Spondylolisthesis, site unspecified: Secondary | ICD-10-CM | POA: Diagnosis not present

## 2012-05-12 DIAGNOSIS — IMO0002 Reserved for concepts with insufficient information to code with codable children: Secondary | ICD-10-CM | POA: Diagnosis not present

## 2012-05-12 DIAGNOSIS — M48061 Spinal stenosis, lumbar region without neurogenic claudication: Secondary | ICD-10-CM | POA: Diagnosis not present

## 2012-05-20 DIAGNOSIS — M4310 Spondylolisthesis, site unspecified: Secondary | ICD-10-CM | POA: Diagnosis not present

## 2012-05-23 DIAGNOSIS — B351 Tinea unguium: Secondary | ICD-10-CM | POA: Diagnosis not present

## 2012-05-23 DIAGNOSIS — L84 Corns and callosities: Secondary | ICD-10-CM | POA: Diagnosis not present

## 2012-05-23 DIAGNOSIS — M2040 Other hammer toe(s) (acquired), unspecified foot: Secondary | ICD-10-CM | POA: Diagnosis not present

## 2012-05-23 DIAGNOSIS — M79609 Pain in unspecified limb: Secondary | ICD-10-CM | POA: Diagnosis not present

## 2012-06-06 DIAGNOSIS — H40019 Open angle with borderline findings, low risk, unspecified eye: Secondary | ICD-10-CM | POA: Diagnosis not present

## 2012-06-07 DIAGNOSIS — M2040 Other hammer toe(s) (acquired), unspecified foot: Secondary | ICD-10-CM | POA: Diagnosis not present

## 2012-06-07 DIAGNOSIS — G4730 Sleep apnea, unspecified: Secondary | ICD-10-CM | POA: Diagnosis not present

## 2012-06-15 DIAGNOSIS — M2040 Other hammer toe(s) (acquired), unspecified foot: Secondary | ICD-10-CM | POA: Diagnosis not present

## 2012-06-27 DIAGNOSIS — L600 Ingrowing nail: Secondary | ICD-10-CM | POA: Diagnosis not present

## 2012-08-01 DIAGNOSIS — R300 Dysuria: Secondary | ICD-10-CM | POA: Diagnosis not present

## 2012-08-01 DIAGNOSIS — Z6831 Body mass index (BMI) 31.0-31.9, adult: Secondary | ICD-10-CM | POA: Diagnosis not present

## 2012-08-01 DIAGNOSIS — N318 Other neuromuscular dysfunction of bladder: Secondary | ICD-10-CM | POA: Diagnosis not present

## 2012-08-30 DIAGNOSIS — M4310 Spondylolisthesis, site unspecified: Secondary | ICD-10-CM | POA: Diagnosis not present

## 2012-09-01 DIAGNOSIS — M545 Low back pain, unspecified: Secondary | ICD-10-CM | POA: Diagnosis not present

## 2012-09-07 DIAGNOSIS — M545 Low back pain, unspecified: Secondary | ICD-10-CM | POA: Diagnosis not present

## 2012-09-09 DIAGNOSIS — M545 Low back pain, unspecified: Secondary | ICD-10-CM | POA: Diagnosis not present

## 2012-09-13 DIAGNOSIS — M545 Low back pain, unspecified: Secondary | ICD-10-CM | POA: Diagnosis not present

## 2012-09-14 DIAGNOSIS — M545 Low back pain, unspecified: Secondary | ICD-10-CM | POA: Diagnosis not present

## 2012-09-21 DIAGNOSIS — M545 Low back pain, unspecified: Secondary | ICD-10-CM | POA: Diagnosis not present

## 2012-09-23 DIAGNOSIS — M545 Low back pain, unspecified: Secondary | ICD-10-CM | POA: Diagnosis not present

## 2012-09-28 DIAGNOSIS — M545 Low back pain, unspecified: Secondary | ICD-10-CM | POA: Diagnosis not present

## 2012-10-02 DIAGNOSIS — N390 Urinary tract infection, site not specified: Secondary | ICD-10-CM | POA: Diagnosis not present

## 2012-10-05 DIAGNOSIS — M545 Low back pain, unspecified: Secondary | ICD-10-CM | POA: Diagnosis not present

## 2012-10-07 DIAGNOSIS — M545 Low back pain, unspecified: Secondary | ICD-10-CM | POA: Diagnosis not present

## 2012-10-20 DIAGNOSIS — Z1211 Encounter for screening for malignant neoplasm of colon: Secondary | ICD-10-CM | POA: Diagnosis not present

## 2012-10-20 DIAGNOSIS — K5730 Diverticulosis of large intestine without perforation or abscess without bleeding: Secondary | ICD-10-CM | POA: Diagnosis not present

## 2012-10-21 DIAGNOSIS — R300 Dysuria: Secondary | ICD-10-CM | POA: Diagnosis not present

## 2012-10-21 DIAGNOSIS — N390 Urinary tract infection, site not specified: Secondary | ICD-10-CM | POA: Diagnosis not present

## 2013-01-07 DIAGNOSIS — R350 Frequency of micturition: Secondary | ICD-10-CM | POA: Diagnosis not present

## 2013-01-07 DIAGNOSIS — N390 Urinary tract infection, site not specified: Secondary | ICD-10-CM | POA: Diagnosis not present

## 2013-02-02 DIAGNOSIS — H43399 Other vitreous opacities, unspecified eye: Secondary | ICD-10-CM | POA: Diagnosis not present

## 2013-02-02 DIAGNOSIS — H2510 Age-related nuclear cataract, unspecified eye: Secondary | ICD-10-CM | POA: Diagnosis not present

## 2013-02-02 DIAGNOSIS — H40019 Open angle with borderline findings, low risk, unspecified eye: Secondary | ICD-10-CM | POA: Diagnosis not present

## 2013-02-02 DIAGNOSIS — H04129 Dry eye syndrome of unspecified lacrimal gland: Secondary | ICD-10-CM | POA: Diagnosis not present

## 2013-02-07 DIAGNOSIS — Z6834 Body mass index (BMI) 34.0-34.9, adult: Secondary | ICD-10-CM | POA: Diagnosis not present

## 2013-02-07 DIAGNOSIS — N318 Other neuromuscular dysfunction of bladder: Secondary | ICD-10-CM | POA: Diagnosis not present

## 2013-02-07 DIAGNOSIS — Z0000 Encounter for general adult medical examination without abnormal findings: Secondary | ICD-10-CM | POA: Diagnosis not present

## 2013-02-07 DIAGNOSIS — Z79899 Other long term (current) drug therapy: Secondary | ICD-10-CM | POA: Diagnosis not present

## 2013-02-07 DIAGNOSIS — Z23 Encounter for immunization: Secondary | ICD-10-CM | POA: Diagnosis not present

## 2013-02-07 DIAGNOSIS — N390 Urinary tract infection, site not specified: Secondary | ICD-10-CM | POA: Diagnosis not present

## 2013-02-07 DIAGNOSIS — R7309 Other abnormal glucose: Secondary | ICD-10-CM | POA: Diagnosis not present

## 2013-02-07 DIAGNOSIS — R0789 Other chest pain: Secondary | ICD-10-CM | POA: Diagnosis not present

## 2013-02-07 DIAGNOSIS — M899 Disorder of bone, unspecified: Secondary | ICD-10-CM | POA: Diagnosis not present

## 2013-02-22 DIAGNOSIS — R82998 Other abnormal findings in urine: Secondary | ICD-10-CM | POA: Diagnosis not present

## 2013-02-22 DIAGNOSIS — N3020 Other chronic cystitis without hematuria: Secondary | ICD-10-CM | POA: Diagnosis not present

## 2013-03-10 DIAGNOSIS — M549 Dorsalgia, unspecified: Secondary | ICD-10-CM | POA: Diagnosis not present

## 2013-03-10 DIAGNOSIS — M545 Low back pain, unspecified: Secondary | ICD-10-CM | POA: Diagnosis not present

## 2013-03-10 DIAGNOSIS — E663 Overweight: Secondary | ICD-10-CM | POA: Diagnosis not present

## 2013-03-14 ENCOUNTER — Other Ambulatory Visit: Payer: Self-pay

## 2013-03-14 DIAGNOSIS — Z1231 Encounter for screening mammogram for malignant neoplasm of breast: Secondary | ICD-10-CM

## 2013-03-22 DIAGNOSIS — N3020 Other chronic cystitis without hematuria: Secondary | ICD-10-CM | POA: Diagnosis not present

## 2013-03-22 DIAGNOSIS — R82998 Other abnormal findings in urine: Secondary | ICD-10-CM | POA: Diagnosis not present

## 2013-03-24 DIAGNOSIS — R300 Dysuria: Secondary | ICD-10-CM | POA: Diagnosis not present

## 2013-03-24 DIAGNOSIS — R3915 Urgency of urination: Secondary | ICD-10-CM | POA: Diagnosis not present

## 2013-03-24 DIAGNOSIS — N3020 Other chronic cystitis without hematuria: Secondary | ICD-10-CM | POA: Diagnosis not present

## 2013-03-24 DIAGNOSIS — R350 Frequency of micturition: Secondary | ICD-10-CM | POA: Diagnosis not present

## 2013-04-11 ENCOUNTER — Ambulatory Visit
Admission: RE | Admit: 2013-04-11 | Discharge: 2013-04-11 | Disposition: A | Discharge status: Home / Self Care | Payer: PRIVATE HEALTH INSURANCE | Source: Ambulatory Visit

## 2013-04-11 DIAGNOSIS — Z1231 Encounter for screening mammogram for malignant neoplasm of breast: Secondary | ICD-10-CM | POA: Diagnosis not present

## 2013-04-26 DIAGNOSIS — N3020 Other chronic cystitis without hematuria: Secondary | ICD-10-CM | POA: Diagnosis not present

## 2013-04-26 DIAGNOSIS — R3915 Urgency of urination: Secondary | ICD-10-CM | POA: Diagnosis not present

## 2013-06-16 DIAGNOSIS — M1710 Unilateral primary osteoarthritis, unspecified knee: Secondary | ICD-10-CM | POA: Diagnosis not present

## 2013-06-26 ENCOUNTER — Ambulatory Visit: Payer: Medicare Other | Attending: Orthopedic Surgery

## 2013-06-26 DIAGNOSIS — Z981 Arthrodesis status: Secondary | ICD-10-CM | POA: Insufficient documentation

## 2013-06-26 DIAGNOSIS — IMO0001 Reserved for inherently not codable concepts without codable children: Secondary | ICD-10-CM | POA: Diagnosis not present

## 2013-06-26 DIAGNOSIS — M810 Age-related osteoporosis without current pathological fracture: Secondary | ICD-10-CM | POA: Insufficient documentation

## 2013-06-26 DIAGNOSIS — R5381 Other malaise: Secondary | ICD-10-CM | POA: Diagnosis not present

## 2013-06-26 DIAGNOSIS — M25569 Pain in unspecified knee: Secondary | ICD-10-CM | POA: Insufficient documentation

## 2013-06-28 ENCOUNTER — Ambulatory Visit: Payer: Medicare Other

## 2013-07-03 ENCOUNTER — Ambulatory Visit: Payer: Medicare Other

## 2013-07-05 ENCOUNTER — Ambulatory Visit: Payer: Medicare Other | Admitting: Physical Therapy

## 2013-07-06 ENCOUNTER — Ambulatory Visit: Payer: Medicare Other | Admitting: Physical Therapy

## 2013-07-10 ENCOUNTER — Ambulatory Visit: Payer: Medicare Other | Attending: Orthopedic Surgery | Admitting: Physical Therapy

## 2013-07-10 DIAGNOSIS — R5381 Other malaise: Secondary | ICD-10-CM | POA: Insufficient documentation

## 2013-07-10 DIAGNOSIS — Z981 Arthrodesis status: Secondary | ICD-10-CM | POA: Insufficient documentation

## 2013-07-10 DIAGNOSIS — M810 Age-related osteoporosis without current pathological fracture: Secondary | ICD-10-CM | POA: Diagnosis not present

## 2013-07-10 DIAGNOSIS — IMO0001 Reserved for inherently not codable concepts without codable children: Secondary | ICD-10-CM | POA: Diagnosis not present

## 2013-07-10 DIAGNOSIS — M25569 Pain in unspecified knee: Secondary | ICD-10-CM | POA: Insufficient documentation

## 2013-07-12 ENCOUNTER — Ambulatory Visit: Payer: Medicare Other | Admitting: Physical Therapy

## 2013-07-17 ENCOUNTER — Ambulatory Visit: Payer: Medicare Other | Admitting: Physical Therapy

## 2013-07-19 ENCOUNTER — Ambulatory Visit: Payer: Medicare Other

## 2013-09-22 DIAGNOSIS — J019 Acute sinusitis, unspecified: Secondary | ICD-10-CM | POA: Diagnosis not present

## 2013-09-22 DIAGNOSIS — R059 Cough, unspecified: Secondary | ICD-10-CM | POA: Diagnosis not present

## 2013-09-22 DIAGNOSIS — R05 Cough: Secondary | ICD-10-CM | POA: Diagnosis not present

## 2013-11-16 DIAGNOSIS — M1710 Unilateral primary osteoarthritis, unspecified knee: Secondary | ICD-10-CM | POA: Diagnosis not present

## 2013-12-21 DIAGNOSIS — R3915 Urgency of urination: Secondary | ICD-10-CM | POA: Diagnosis not present

## 2013-12-21 DIAGNOSIS — R300 Dysuria: Secondary | ICD-10-CM | POA: Diagnosis not present

## 2013-12-21 DIAGNOSIS — R82998 Other abnormal findings in urine: Secondary | ICD-10-CM | POA: Diagnosis not present

## 2014-01-24 DIAGNOSIS — Z23 Encounter for immunization: Secondary | ICD-10-CM | POA: Diagnosis not present

## 2014-01-25 DIAGNOSIS — N3020 Other chronic cystitis without hematuria: Secondary | ICD-10-CM | POA: Diagnosis not present

## 2014-01-25 DIAGNOSIS — R300 Dysuria: Secondary | ICD-10-CM | POA: Diagnosis not present

## 2014-01-30 DIAGNOSIS — M1710 Unilateral primary osteoarthritis, unspecified knee: Secondary | ICD-10-CM | POA: Diagnosis not present

## 2014-02-09 DIAGNOSIS — M1711 Unilateral primary osteoarthritis, right knee: Secondary | ICD-10-CM | POA: Diagnosis not present

## 2014-02-16 DIAGNOSIS — M1711 Unilateral primary osteoarthritis, right knee: Secondary | ICD-10-CM | POA: Diagnosis not present

## 2014-02-19 DIAGNOSIS — R7309 Other abnormal glucose: Secondary | ICD-10-CM | POA: Diagnosis not present

## 2014-02-19 DIAGNOSIS — Z0000 Encounter for general adult medical examination without abnormal findings: Secondary | ICD-10-CM | POA: Diagnosis not present

## 2014-02-19 DIAGNOSIS — M5416 Radiculopathy, lumbar region: Secondary | ICD-10-CM | POA: Diagnosis not present

## 2014-02-19 DIAGNOSIS — Z79899 Other long term (current) drug therapy: Secondary | ICD-10-CM | POA: Diagnosis not present

## 2014-02-19 DIAGNOSIS — Z1389 Encounter for screening for other disorder: Secondary | ICD-10-CM | POA: Diagnosis not present

## 2014-02-19 DIAGNOSIS — G4730 Sleep apnea, unspecified: Secondary | ICD-10-CM | POA: Diagnosis not present

## 2014-02-19 DIAGNOSIS — N3281 Overactive bladder: Secondary | ICD-10-CM | POA: Diagnosis not present

## 2014-02-19 DIAGNOSIS — M899 Disorder of bone, unspecified: Secondary | ICD-10-CM | POA: Diagnosis not present

## 2014-02-19 DIAGNOSIS — M179 Osteoarthritis of knee, unspecified: Secondary | ICD-10-CM | POA: Diagnosis not present

## 2014-02-22 DIAGNOSIS — M1711 Unilateral primary osteoarthritis, right knee: Secondary | ICD-10-CM | POA: Diagnosis not present

## 2014-03-01 DIAGNOSIS — M1711 Unilateral primary osteoarthritis, right knee: Secondary | ICD-10-CM | POA: Diagnosis not present

## 2014-03-09 DIAGNOSIS — Z6833 Body mass index (BMI) 33.0-33.9, adult: Secondary | ICD-10-CM | POA: Diagnosis not present

## 2014-03-09 DIAGNOSIS — M545 Low back pain: Secondary | ICD-10-CM | POA: Diagnosis not present

## 2014-03-12 DIAGNOSIS — Z780 Asymptomatic menopausal state: Secondary | ICD-10-CM | POA: Diagnosis not present

## 2014-04-16 DIAGNOSIS — H40013 Open angle with borderline findings, low risk, bilateral: Secondary | ICD-10-CM | POA: Diagnosis not present

## 2014-04-16 DIAGNOSIS — H2513 Age-related nuclear cataract, bilateral: Secondary | ICD-10-CM | POA: Diagnosis not present

## 2014-04-16 DIAGNOSIS — H25013 Cortical age-related cataract, bilateral: Secondary | ICD-10-CM | POA: Diagnosis not present

## 2014-04-16 DIAGNOSIS — H04123 Dry eye syndrome of bilateral lacrimal glands: Secondary | ICD-10-CM | POA: Diagnosis not present

## 2014-06-04 ENCOUNTER — Other Ambulatory Visit: Payer: Self-pay

## 2014-06-04 DIAGNOSIS — Z1231 Encounter for screening mammogram for malignant neoplasm of breast: Secondary | ICD-10-CM

## 2014-06-06 DIAGNOSIS — R3915 Urgency of urination: Secondary | ICD-10-CM | POA: Diagnosis not present

## 2014-06-06 DIAGNOSIS — R300 Dysuria: Secondary | ICD-10-CM | POA: Diagnosis not present

## 2014-06-06 DIAGNOSIS — N390 Urinary tract infection, site not specified: Secondary | ICD-10-CM | POA: Diagnosis not present

## 2014-06-14 ENCOUNTER — Encounter (INDEPENDENT_AMBULATORY_CARE_PROVIDER_SITE_OTHER): Payer: Self-pay

## 2014-06-14 ENCOUNTER — Ambulatory Visit
Admission: RE | Admit: 2014-06-14 | Discharge: 2014-06-14 | Disposition: A | Discharge status: Home / Self Care | Payer: Medicare Other | Source: Ambulatory Visit

## 2014-06-14 DIAGNOSIS — Z1231 Encounter for screening mammogram for malignant neoplasm of breast: Secondary | ICD-10-CM

## 2014-07-11 DIAGNOSIS — M1711 Unilateral primary osteoarthritis, right knee: Secondary | ICD-10-CM | POA: Diagnosis not present

## 2014-07-11 DIAGNOSIS — M1712 Unilateral primary osteoarthritis, left knee: Secondary | ICD-10-CM | POA: Diagnosis not present

## 2014-08-30 DIAGNOSIS — M1711 Unilateral primary osteoarthritis, right knee: Secondary | ICD-10-CM | POA: Diagnosis not present

## 2014-08-30 DIAGNOSIS — M1712 Unilateral primary osteoarthritis, left knee: Secondary | ICD-10-CM | POA: Diagnosis not present

## 2014-10-29 DIAGNOSIS — M1712 Unilateral primary osteoarthritis, left knee: Secondary | ICD-10-CM | POA: Diagnosis not present

## 2014-11-01 DIAGNOSIS — G4730 Sleep apnea, unspecified: Secondary | ICD-10-CM | POA: Diagnosis not present

## 2014-11-01 DIAGNOSIS — M179 Osteoarthritis of knee, unspecified: Secondary | ICD-10-CM | POA: Diagnosis not present

## 2014-11-09 ENCOUNTER — Ambulatory Visit: Payer: Self-pay | Admitting: Surgical

## 2014-11-09 NOTE — Progress Notes (Signed)
Preoperative surgical orders have been place into the Epic hospital system for Fonda KinderJanet G Belfiore on 11/09/2014, 11:09 AM  by Patrica DuelPERKINS, ALEXZANDREW for surgery on 11-26-2014.  Preop Total Knee orders including Experal, IV Tylenol, and IV Decadron as long as there are no contraindications to the above medications.  Please note, she was initially scheduled for the right knee but has switch to the left knee so a second consent order was added for the left total knee. Avel Peacerew Perkins, PA-C

## 2014-11-16 ENCOUNTER — Encounter (HOSPITAL_COMMUNITY): Payer: Self-pay

## 2014-11-19 ENCOUNTER — Encounter (HOSPITAL_COMMUNITY)
Admission: RE | Admit: 2014-11-19 | Discharge: 2014-11-19 | Disposition: A | Discharge status: Home / Self Care | Payer: Medicare Other | Source: Ambulatory Visit | Attending: Orthopedic Surgery | Admitting: Orthopedic Surgery

## 2014-11-19 ENCOUNTER — Encounter (HOSPITAL_COMMUNITY): Payer: Self-pay

## 2014-11-19 DIAGNOSIS — M179 Osteoarthritis of knee, unspecified: Secondary | ICD-10-CM | POA: Diagnosis not present

## 2014-11-19 DIAGNOSIS — Z01818 Encounter for other preprocedural examination: Secondary | ICD-10-CM | POA: Diagnosis not present

## 2014-11-19 HISTORY — DX: Polyneuropathy, unspecified: G62.9

## 2014-11-19 HISTORY — DX: Urinary tract infection, site not specified: A49.9

## 2014-11-19 HISTORY — DX: Urinary tract infection, site not specified: N39.0

## 2014-11-19 HISTORY — DX: Overactive bladder: N32.81

## 2014-11-19 HISTORY — DX: Anesthesia of skin: R20.0

## 2014-11-19 LAB — COMPREHENSIVE METABOLIC PANEL
ALK PHOS: 49 U/L (ref 38–126)
ALT: 18 U/L (ref 14–54)
ANION GAP: 10 (ref 5–15)
AST: 20 U/L (ref 15–41)
Albumin: 4 g/dL (ref 3.5–5.0)
BILIRUBIN TOTAL: 0.5 mg/dL (ref 0.3–1.2)
BUN: 17 mg/dL (ref 6–20)
CO2: 24 mmol/L (ref 22–32)
Calcium: 9 mg/dL (ref 8.9–10.3)
Chloride: 105 mmol/L (ref 101–111)
Creatinine, Ser: 0.84 mg/dL (ref 0.44–1.00)
GFR calc non Af Amer: >60 mL/min (ref >60)
Glucose, Bld: 93 mg/dL (ref 65–99)
Potassium: 4 mmol/L (ref 3.5–5.1)
Sodium: 139 mmol/L (ref 135–145)
TOTAL PROTEIN: 7 g/dL (ref 6.5–8.1)

## 2014-11-19 LAB — CBC
HEMATOCRIT: 40.8 % (ref 36.0–46.0)
Hemoglobin: 13.3 g/dL (ref 12.0–15.0)
MCH: 27.9 pg (ref 26.0–34.0)
MCHC: 32.6 g/dL (ref 30.0–36.0)
MCV: 85.7 fL (ref 78.0–100.0)
PLATELETS: 194 10*3/uL (ref 150–400)
RBC: 4.76 MIL/uL (ref 3.87–5.11)
RDW: 13.7 % (ref 11.5–15.5)
WBC: 6.6 10*3/uL (ref 4.0–10.5)

## 2014-11-19 LAB — URINE MICROSCOPIC-ADD ON

## 2014-11-19 LAB — URINALYSIS, ROUTINE W REFLEX MICROSCOPIC
Bilirubin Urine: NEGATIVE
GLUCOSE, UA: NEGATIVE mg/dL
HGB URINE DIPSTICK: NEGATIVE
KETONES UR: NEGATIVE mg/dL
Nitrite: NEGATIVE
PH: 7 (ref 5.0–8.0)
PROTEIN: NEGATIVE mg/dL
Specific Gravity, Urine: 1.023 (ref 1.005–1.030)
Urobilinogen, UA: 1 mg/dL (ref 0.0–1.0)

## 2014-11-19 LAB — SURGICAL PCR SCREEN
MRSA, PCR: POSITIVE — AB
Staphylococcus aureus: POSITIVE — AB

## 2014-11-19 LAB — APTT: aPTT: 29 seconds (ref 24–37)

## 2014-11-19 LAB — PROTIME-INR
INR: 0.98 (ref 0.00–1.49)
Prothrombin Time: 13.2 seconds (ref 11.6–15.2)

## 2014-11-19 LAB — ABO/RH: ABO/RH(D): O POS

## 2014-11-19 NOTE — Patient Instructions (Addendum)
Lauren English  11/19/2014   Your procedure is scheduled on: Monday November 26, 2014  [facility]  Entrance take [place]  elevators to 3rd floor to  Short Stay Center at 6:25 AM.  Call this number if you have problems the morning of surgery [phone]   Remember: ONLY 1 PERSON MAY GO WITH YOU TO SHORT STAY TO GET  READY MORNING OF YOUR SURGERY.  Do not eat food or drink liquids :After Midnight.                BRING MASK AND TUBE FOR CPAP DAY OF SURGERY.   Take these medicines the morning of surgery with A SIP OF WATER: NONE                                You may not have any metal on your body including hair pins and              piercings  Do not wear jewelry, make-up, lotions, powders or perfumes, deodorant             Do not wear nail polish.  Do not shave  48 hours prior to surgery.               Do not bring valuables to the hospital. [place] IS NOT             RESPONSIBLE   FOR VALUABLES.  Contacts, dentures or bridgework may not be worn into surgery.  Leave suitcase in the car. After surgery it may be brought to your room.     Special Instructions: DO NOT REMOVE BLUE BLOOD BAND PRIOR TO SURGERY DATE               Please read over the following fact sheets you were given:MRSA INFORMATION SHEET; INCENTIVE SPIROMETER; BLOOD TRANSFUSION INFORMATION SHEET _____________________________________________________________________             [facility] Pre[facility] Before surgery, you can play an important role.  Because skin is not sterile, your skin needs to be as free of germs as possible.  You can reduce the number of germs on your skin by washing with CHG (chlorahexidine gluconate) soap before surgery.  CHG is an antiseptic cleaner which kills germs and bonds with the skin to continue killing germs even after washing. Please DO NOT use if you have an allergy to CHG or antibacterial soaps.  If your skin becomes reddened/irritated  stop using the CHG and inform your nurse when you arrive at Short Stay. Do not shave (including legs and underarms) for at least 48 hours prior to the first CHG shower.  You may shave your face/neck. Please follow these instructions carefully:  1.  Shower with CHG Soap the night before surgery and the  morning of Surgery.  2.  If you choose to wash your hair, wash your hair first as usual with your  normal  shampoo.  3.  After you shampoo, rinse your hair and body thoroughly to remove the  shampoo.                           4.  Use CHG as you would any other liquid soap.  You can apply chg directly  to the skin and  wash                       Gently with a scrungie or clean washcloth.  5.  Apply the CHG Soap to your body ONLY FROM THE NECK DOWN.   Do not use on face/ open                           Wound or open sores. Avoid contact with eyes, ears mouth and genitals (private parts).                       Wash face,  Genitals (private parts) with your normal soap.             6.  Wash thoroughly, paying special attention to the area where your surgery  will be performed.  7.  Thoroughly rinse your body with warm water from the neck down.  8.  DO NOT shower/wash with your normal soap after using and rinsing off  the CHG Soap.                9.  Pat yourself dry with a clean towel.            10.  Wear clean pajamas.            11.  Place clean sheets on your bed the night of your first shower and do not  sleep with pets. Day of Surgery : Do not apply any lotions/deodorants the morning of surgery.  Please wear clean clothes to the hospital/surgery center.  FAILURE TO FOLLOW THESE INSTRUCTIONS MAY RESULT IN THE CANCELLATION OF YOUR SURGERY PATIENT SIGNATURE_________________________________  NURSE SIGNATURE__________________________________  ________________________________________________________________________   [name]  An incentive spirometer is a tool that can help keep your  lungs clear and active. This tool measures how well you are filling your lungs with each breath. Taking long deep breaths may help reverse or decrease the chance of developing breathing (pulmonary) problems (especially infection) following:  A long period of time when you are unable to move or be active. BEFORE THE PROCEDURE   If the spirometer includes an indicator to show your best effort, your nurse or respiratory therapist will set it to a desired goal.  If possible, sit up straight or lean slightly forward. Try not to slouch.  Hold the incentive spirometer in an upright position. INSTRUCTIONS FOR USE   Sit on the edge of your bed if possible, or sit up as far as you can in bed or on a chair.  Hold the incentive spirometer in an upright position.  Breathe out normally.  Place the mouthpiece in your mouth and seal your lips tightly around it.  Breathe in slowly and as deeply as possible, raising the piston or the ball toward the top of the column.  Hold your breath for 3-5 seconds or for as long as possible. Allow the piston or ball to fall to the bottom of the column.  Remove the mouthpiece from your mouth and breathe out normally.  Rest for a few seconds and repeat Steps 1 through 7 at least 10 times every 1-2 hours when you are awake. Take your time and take a few normal breaths between deep breaths.  The spirometer may include an indicator to show your best effort. Use the indicator as a goal to work toward during each repetition.  After each set of 10 deep  breaths, practice coughing to be sure your lungs are clear. If you have an incision (the cut made at the time of surgery), support your incision when coughing by placing a pillow or rolled up towels firmly against it. Once you are able to get out of bed, walk around indoors and cough well. You may stop using the incentive spirometer when instructed by your caregiver.  RISKS AND COMPLICATIONS  Take your time so you do not  get dizzy or light-headed.  If you are in pain, you may need to take or ask for pain medication before doing incentive spirometry. It is harder to take a deep breath if you are having pain. AFTER USE  Rest and breathe slowly and easily.  It can be helpful to keep track of a log of your progress. Your caregiver can provide you with a simple table to help with this. If you are using the spirometer at home, follow these instructions: [place] IF:   You are having difficultly using the spirometer.  You have trouble using the spirometer as often as instructed.  Your pain medication is not giving enough relief while using the spirometer.  You develop fever of 100.5 F (38.1 C) or higher. SEEK IMMEDIATE MEDICAL CARE IF:   You cough up bloody sputum that had not been present before.  You develop fever of 102 F (38.9 C) or greater.  You develop worsening pain at or near the incision site. MAKE SURE YOU:   Understand these instructions.  Will watch your condition.  Will get help right away if you are not doing well or get worse. Document Released: 09/07/2006 Document Revised: 07/20/2011 Document Reviewed: 11/08/2006 ExitCare Patient Information 2014 ExitCare, [place].   ________________________________________________________________________  WHAT IS A BLOOD TRANSFUSION? Blood Transfusion Information  A transfusion is the replacement of blood or some of its parts. Blood is made up of multiple cells which provide different functions.  Red blood cells carry oxygen and are used for blood loss replacement.  White blood cells fight against infection.  Platelets control bleeding.  Plasma helps clot blood.  Other blood products are available for specialized needs, such as hemophilia or other clotting disorders. BEFORE THE TRANSFUSION  Who gives blood for transfusions?   Healthy volunteers who are fully evaluated to make sure their blood is safe. This is blood bank  blood. Transfusion therapy is the safest it has ever been in the practice of medicine. Before blood is taken from a donor, a complete history is taken to make sure that person has no history of diseases nor engages in risky social behavior (examples are intravenous drug use or sexual activity with multiple partners). The donor's travel history is screened to minimize risk of transmitting infections, such as malaria. The donated blood is tested for signs of infectious diseases, such as HIV and hepatitis. The blood is then tested to be sure it is compatible with you in order to minimize the chance of a transfusion reaction. If you or a relative donates blood, this is often done in anticipation of surgery and is not appropriate for emergency situations. It takes many days to process the donated blood. RISKS AND COMPLICATIONS Although transfusion therapy is very safe and saves many lives, the main dangers of transfusion include:   Getting an infectious disease.  Developing a transfusion reaction. This is an allergic reaction to something in the blood you were given. Every precaution is taken to prevent this. The decision to have a blood transfusion has  been considered carefully by your caregiver before blood is given. Blood is not given unless the benefits outweigh the risks. AFTER THE TRANSFUSION  Right after receiving a blood transfusion, you will usually feel much better and more energetic. This is especially true if your red blood cells have gotten low (anemic). The transfusion raises the level of the red blood cells which carry oxygen, and this usually causes an energy increase.  The nurse administering the transfusion will monitor you carefully for complications. HOME CARE INSTRUCTIONS  No special instructions are needed after a transfusion. You may find your energy is better. Speak with your caregiver about any limitations on activity for underlying diseases you may have. SEEK MEDICAL CARE IF:    Your condition is not improving after your transfusion.  You develop redness or irritation at the intravenous (IV) site. SEEK IMMEDIATE MEDICAL CARE IF:  Any of the following symptoms occur over the next 12 hours:  Shaking chills.  You have a temperature by mouth above 102 F (38.9 C), not controlled by medicine.  Chest, back, or muscle pain.  People around you feel you are not acting correctly or are confused.  Shortness of breath or difficulty breathing.  Dizziness and fainting.  You get a rash or develop hives.  You have a decrease in urine output.  Your urine turns a dark color or changes to pink, red, or brown. Any of the following symptoms occur over the next 10 days:  You have a temperature by mouth above 102 F (38.9 C), not controlled by medicine.  Shortness of breath.  Weakness after normal activity.  The white part of the eye turns yellow (jaundice).  You have a decrease in the amount of urine or are urinating less often.  Your urine turns a dark color or changes to pink, red, or brown. Document Released: 04/24/2000 Document Revised: 07/20/2011 Document Reviewed: 12/12/2007 [facility] Patient Information 2014 [place], [place].  _______________________________________________________________________

## 2014-11-19 NOTE — Progress Notes (Signed)
Urinalysis and micro results in epic per PAT visit 11/19/2014 sent to Dr Lequita HaltAluisio

## 2014-11-20 NOTE — Progress Notes (Addendum)
Surgical screening results in epic per PAT visit 11/19/2014 positive for MRSA and STAPH. Results sent to Dr Lequita HaltAluisio.  Pt aware. Prescription for Mupriocin Ointment [facility] to [facility] [address] [place]. Spoke with Danielle/pharmacist.

## 2014-11-21 NOTE — H&P (Signed)
TOTAL KNEE ADMISSION H&P  Patient is being admitted for left total knee arthroplasty.  Subjective:  Chief Complaint:left knee pain.  HPI: Lauren English, 74 y.o. female, has a history of pain and functional disability in the left knee due to arthritis and has failed non-surgical conservative treatments for greater than 12 weeks to includeNSAID's and/or analgesics, corticosteriod injections, viscosupplementation injections and activity modification.  Onset of symptoms was gradual, starting 6 years ago with gradually worsening course since that time. The patient noted prior procedures on the knee to include  arthroscopy and menisectomy on the left knee(s).  Patient currently rates pain in the left knee(s) at 7 out of 10 with activity. Patient has night pain, worsening of pain with activity and weight bearing, pain that interferes with activities of daily living, pain with passive range of motion, crepitus and joint swelling.  Patient has evidence of periarticular osteophytes and joint space narrowing by imaging studies.  There is no active infection.   Past Medical History  Diagnosis Date  . PONV (postoperative nausea and vomiting)   . Bladder incontinence   . OSA on CPAP   . Arthritis     "qwhere; feet, knees, neck" (04/21/2012)  . Chronic back pain   . Closed angle glaucoma     "both eyes" (04/21/2012)  . Prediabetes   . Urinary tract bacterial infections   . Overactive bladder   . Numbness     fingertips bilat and feet bilat   . Peripheral neuropathy     feet bilat     Past Surgical History  Procedure Laterality Date  . Back surgery    . Knee arthroplasty  06/2008    "slipped on ice; torn ligaments & cartilage" (04/21/2012)  . Posterior lumbar fusion  04/21/2012  . Tonsillectomy and adenoidectomy  ~ 1950  . Abdominal hysterectomy  1980    "partial" (04/21/2012)  . Bladder suspension  1990  . Anterior fusion cervical spine  12/2005  . Tubal ligation  1970's  . Dilation and  curettage of uterus  1980  . Appendectomy        Current outpatient prescriptions:  .  Acetylcarnitine HCl (ACETYL L-CARNITINE PO), Take 1 capsule by mouth daily., Disp: , Rfl:  .  Calcium Carb-Cholecalciferol (CALCIUM 600 + D PO), Take 1 tablet by mouth 2 (two) times daily., Disp: , Rfl:  .  Cholecalciferol (VITAMIN D) 2000 UNITS tablet, Take 2,000 Units by mouth daily., Disp: , Rfl:  .  docusate sodium (COLACE) 100 MG capsule, Take 100 mg by mouth 2 (two) times daily., Disp: , Rfl:  .  Glucosamine-Chondroitin 750-600 MG TABS, Take 1 tablet by mouth 2 (two) times daily., Disp: , Rfl:  .  OVER THE COUNTER MEDICATION, Take 2 tablets by mouth every evening., Disp: , Rfl:  .  solifenacin (VESICARE) 10 MG tablet, Take 10 mg by mouth daily., Disp: , Rfl:  .  Turmeric 500 MG CAPS, Take 1 capsule by mouth daily., Disp: , Rfl:    No Known Allergies  History  Substance Use Topics  . Smoking status: Never Smoker   . Smokeless tobacco: Never Used  . Alcohol Use: 0.6 oz/week    1 Glasses of wine per week     Comment: 04/21/2012 "glass of wine maybe once/wk"    Review of Systems  Constitutional: Negative.   Eyes: Negative.   Respiratory: Negative.   Cardiovascular: Negative.   Gastrointestinal: Positive for constipation. Negative for heartburn, nausea, vomiting, abdominal pain, diarrhea, blood  in stool and melena.  Genitourinary: Positive for frequency. Negative for dysuria, urgency, hematuria and flank pain.  Musculoskeletal: Positive for back pain, joint pain and neck pain. Negative for myalgias and falls.       Bilateral knee pain  Skin: Negative.   Neurological: Positive for sensory change. Negative for dizziness, tingling, tremors, speech change, focal weakness, seizures and loss of consciousness.  Endo/Heme/Allergies: Negative.   Psychiatric/Behavioral: Negative.     Objective:  Physical Exam  Constitutional: She is oriented to person, place, and time. She appears well-developed.  No distress.  Overweight  HENT:  Head: Normocephalic and atraumatic.  Right Ear: External ear normal.  Left Ear: External ear normal.  Nose: Nose normal.  Mouth/Throat: Oropharynx is clear and moist.  Eyes: Conjunctivae and EOM are normal.  Neck: Normal range of motion. Neck supple.  Cardiovascular: Normal rate, regular rhythm, normal heart sounds and intact distal pulses.   No murmur heard. Respiratory: Effort normal and breath sounds normal. No respiratory distress. She has no wheezes.  GI: Soft. Bowel sounds are normal. She exhibits no distension. There is no tenderness.  Musculoskeletal:       Right hip: Normal.       Left hip: Normal.       Right knee: She exhibits decreased range of motion and swelling. She exhibits no effusion and no erythema. Tenderness found. Medial joint line and lateral joint line tenderness noted.       Left knee: She exhibits decreased range of motion and swelling. She exhibits no effusion and no erythema. Tenderness found. Medial joint line and lateral joint line tenderness noted.  She is walking with a significantly antalgic gait. Her left knee shows no effusion. There is moderate crepitus on range of motion of the knee with some tenderness medial and lateral with no instability.  Neurological: She is alert and oriented to person, place, and time. She has normal strength and normal reflexes. No sensory deficit.  Skin: No rash noted. She is not diaphoretic. No erythema.  Psychiatric: She has a normal mood and affect. Her behavior is normal.    Vitals  Weight: 230 lb Height: 70in Body Surface Area: 2.22 m Body Mass Index: 33 kg/m  Pulse: 72 (Regular)  BP: 132/70 (Sitting, Left Arm, Standard)  Imaging Review Plain radiographs demonstrate severe degenerative joint disease of the left knee(s). The overall alignment ismild varus. The bone quality appears to be good for age and reported activity level.  Assessment/Plan:  End stage primary  osteoarthritis, left knee   The patient history, physical examination, clinical judgment of the provider and imaging studies are consistent with end stage degenerative joint disease of the left knee(s) and total knee arthroplasty is deemed medically necessary. The treatment options including medical management, injection therapy arthroscopy and arthroplasty were discussed at length. The risks and benefits of total knee arthroplasty were presented and reviewed. The risks due to aseptic loosening, infection, stiffness, patella tracking problems, thromboembolic complications and other imponderables were discussed. The patient acknowledged the explanation, agreed to proceed with the plan and consent was signed. Patient is being admitted for inpatient treatment for surgery, pain control, PT, OT, prophylactic antibiotics, VTE prophylaxis, progressive ambulation and ADL's and discharge planning. The patient is planning to be discharged home with home health services   Wants general anesthesia TXA IV  PCP: Dr. Lisette AbuWolters    Viana Sleep, PA-C

## 2014-11-26 ENCOUNTER — Encounter (HOSPITAL_COMMUNITY)
Admission: RE | Disposition: A | Discharge status: Home Health | Payer: Self-pay | Source: Ambulatory Visit | Attending: Orthopedic Surgery

## 2014-11-26 ENCOUNTER — Encounter (HOSPITAL_COMMUNITY): Payer: Self-pay

## 2014-11-26 ENCOUNTER — Inpatient Hospital Stay (HOSPITAL_COMMUNITY): Payer: Medicare Other | Admitting: Anesthesiology

## 2014-11-26 ENCOUNTER — Inpatient Hospital Stay (HOSPITAL_COMMUNITY)
Admission: RE | Admit: 2014-11-26 | Discharge: 2014-11-28 | DRG: 470 | Disposition: A | Discharge status: Home Health | Payer: Medicare Other | Source: Ambulatory Visit | Attending: Orthopedic Surgery | Admitting: Orthopedic Surgery

## 2014-11-26 DIAGNOSIS — Z981 Arthrodesis status: Secondary | ICD-10-CM

## 2014-11-26 DIAGNOSIS — M179 Osteoarthritis of knee, unspecified: Secondary | ICD-10-CM | POA: Diagnosis present

## 2014-11-26 DIAGNOSIS — Z01812 Encounter for preprocedural laboratory examination: Secondary | ICD-10-CM

## 2014-11-26 DIAGNOSIS — R32 Unspecified urinary incontinence: Secondary | ICD-10-CM | POA: Diagnosis present

## 2014-11-26 DIAGNOSIS — Z79899 Other long term (current) drug therapy: Secondary | ICD-10-CM

## 2014-11-26 DIAGNOSIS — Z90710 Acquired absence of both cervix and uterus: Secondary | ICD-10-CM | POA: Diagnosis not present

## 2014-11-26 DIAGNOSIS — M1712 Unilateral primary osteoarthritis, left knee: Secondary | ICD-10-CM

## 2014-11-26 DIAGNOSIS — G4733 Obstructive sleep apnea (adult) (pediatric): Secondary | ICD-10-CM | POA: Diagnosis present

## 2014-11-26 DIAGNOSIS — M1710 Unilateral primary osteoarthritis, unspecified knee: Secondary | ICD-10-CM | POA: Diagnosis present

## 2014-11-26 DIAGNOSIS — H409 Unspecified glaucoma: Secondary | ICD-10-CM | POA: Diagnosis present

## 2014-11-26 DIAGNOSIS — G629 Polyneuropathy, unspecified: Secondary | ICD-10-CM | POA: Diagnosis present

## 2014-11-26 DIAGNOSIS — M25562 Pain in left knee: Secondary | ICD-10-CM | POA: Diagnosis not present

## 2014-11-26 HISTORY — PX: TOTAL KNEE ARTHROPLASTY: SHX125

## 2014-11-26 LAB — TYPE AND SCREEN
ABO/RH(D): O POS
Antibody Screen: NEGATIVE

## 2014-11-26 SURGERY — ARTHROPLASTY, KNEE, TOTAL
Anesthesia: Spinal | Site: Knee | Laterality: Left

## 2014-11-26 MED ORDER — FENTANYL CITRATE (PF) 100 MCG/2ML IJ SOLN
INTRAMUSCULAR | Status: AC
  Filled 2014-11-26: qty 2

## 2014-11-26 MED ORDER — DOCUSATE SODIUM 100 MG PO CAPS
100.0000 mg | ORAL_CAPSULE | Freq: Two times a day (BID) | ORAL | Status: DC
  Administered 2014-11-26 – 2014-11-28 (×5): 100 mg via ORAL

## 2014-11-26 MED ORDER — PROPOFOL 10 MG/ML IV BOLUS
INTRAVENOUS | Status: AC
  Filled 2014-11-26: qty 20

## 2014-11-26 MED ORDER — DARIFENACIN HYDROBROMIDE ER 15 MG PO TB24
15.0000 mg | ORAL_TABLET | Freq: Every day | ORAL | Status: DC
  Administered 2014-11-27 – 2014-11-28 (×2): 15 mg via ORAL
  Filled 2014-11-26 (×2): qty 1

## 2014-11-26 MED ORDER — NEOSTIGMINE METHYLSULFATE 10 MG/10ML IV SOLN
INTRAVENOUS | Status: DC | PRN
  Administered 2014-11-26: 4 mg via INTRAVENOUS

## 2014-11-26 MED ORDER — CEFAZOLIN SODIUM-DEXTROSE 2-3 GM-% IV SOLR: 2.0000 g | INTRAVENOUS | Status: DC

## 2014-11-26 MED ORDER — MIDAZOLAM HCL 5 MG/5ML IJ SOLN
INTRAMUSCULAR | Status: DC | PRN
  Administered 2014-11-26: 2 mg via INTRAVENOUS

## 2014-11-26 MED ORDER — TRAMADOL HCL 50 MG PO TABS
50.0000 mg | ORAL_TABLET | Freq: Four times a day (QID) | ORAL | Status: DC | PRN
  Administered 2014-11-26 – 2014-11-27 (×2): 100 mg via ORAL
  Filled 2014-11-26 (×2): qty 2

## 2014-11-26 MED ORDER — KETAMINE HCL 10 MG/ML IJ SOLN
INTRAMUSCULAR | Status: AC
  Filled 2014-11-26: qty 1

## 2014-11-26 MED ORDER — METOCLOPRAMIDE HCL 5 MG/ML IJ SOLN
5.0000 mg | Freq: Three times a day (TID) | INTRAMUSCULAR | Status: DC | PRN
Start: 2014-11-26 — End: 2014-11-28

## 2014-11-26 MED ORDER — GLYCOPYRROLATE 0.2 MG/ML IJ SOLN
INTRAMUSCULAR | Status: AC
  Filled 2014-11-26: qty 3

## 2014-11-26 MED ORDER — HYDROMORPHONE HCL 1 MG/ML IJ SOLN
INTRAMUSCULAR | Status: DC | PRN
  Administered 2014-11-26 (×4): 0.5 mg via INTRAVENOUS

## 2014-11-26 MED ORDER — KETAMINE HCL 10 MG/ML IJ SOLN
INTRAMUSCULAR | Status: DC | PRN
  Administered 2014-11-26: 10 mg via INTRAVENOUS
  Administered 2014-11-26: 30 mg via INTRAVENOUS
  Administered 2014-11-26: 10 mg via INTRAVENOUS

## 2014-11-26 MED ORDER — HYDROMORPHONE HCL 2 MG/ML IJ SOLN
INTRAMUSCULAR | Status: AC
  Filled 2014-11-26: qty 1

## 2014-11-26 MED ORDER — ACETAMINOPHEN 500 MG PO TABS
1000.0000 mg | ORAL_TABLET | Freq: Four times a day (QID) | ORAL | Status: AC
  Administered 2014-11-26 – 2014-11-27 (×4): 1000 mg via ORAL
  Filled 2014-11-26 (×4): qty 2

## 2014-11-26 MED ORDER — SODIUM CHLORIDE 0.9 % IR SOLN
Status: DC | PRN
  Administered 2014-11-26: 1000 mL

## 2014-11-26 MED ORDER — MORPHINE SULFATE 2 MG/ML IJ SOLN
1.0000 mg | INTRAMUSCULAR | Status: DC | PRN
  Administered 2014-11-26 (×2): 1 mg via INTRAVENOUS
  Filled 2014-11-26 (×4): qty 1

## 2014-11-26 MED ORDER — ONDANSETRON HCL 4 MG/2ML IJ SOLN
INTRAMUSCULAR | Status: DC | PRN
  Administered 2014-11-26: 4 mg via INTRAVENOUS

## 2014-11-26 MED ORDER — BUPIVACAINE HCL (PF) 0.25 % IJ SOLN
INTRAMUSCULAR | Status: AC
  Filled 2014-11-26: qty 30

## 2014-11-26 MED ORDER — DEXAMETHASONE SODIUM PHOSPHATE 10 MG/ML IJ SOLN: 10.0000 mg | Freq: Once | INTRAMUSCULAR | Status: DC

## 2014-11-26 MED ORDER — VANCOMYCIN HCL 10 G IV SOLR
1500.0000 mg | Freq: Once | INTRAVENOUS | Status: AC
  Administered 2014-11-26: 1500 mg via INTRAVENOUS
  Filled 2014-11-26: qty 1500

## 2014-11-26 MED ORDER — CHLORHEXIDINE GLUCONATE 4 % EX LIQD: 60.0000 mL | Freq: Once | CUTANEOUS | Status: DC

## 2014-11-26 MED ORDER — ACETAMINOPHEN 10 MG/ML IV SOLN
INTRAVENOUS | Status: AC
  Filled 2014-11-26: qty 100

## 2014-11-26 MED ORDER — ONDANSETRON HCL 4 MG/2ML IJ SOLN: 4.0000 mg | Freq: Once | INTRAMUSCULAR | Status: DC | PRN

## 2014-11-26 MED ORDER — BUPIVACAINE LIPOSOME 1.3 % IJ SUSP
20.0000 mL | Freq: Once | INTRAMUSCULAR | Status: DC
  Filled 2014-11-26: qty 20

## 2014-11-26 MED ORDER — CISATRACURIUM BESYLATE 20 MG/10ML IV SOLN
INTRAVENOUS | Status: AC
  Filled 2014-11-26: qty 10

## 2014-11-26 MED ORDER — MENTHOL 3 MG MT LOZG: 1.0000 | LOZENGE | OROMUCOSAL | Status: DC | PRN

## 2014-11-26 MED ORDER — LIDOCAINE HCL (CARDIAC) 20 MG/ML IV SOLN
INTRAVENOUS | Status: DC | PRN
Start: 2014-11-26 — End: 2014-11-26
  Administered 2014-11-26: 100 mg via INTRAVENOUS

## 2014-11-26 MED ORDER — METHOCARBAMOL 500 MG PO TABS
500.0000 mg | ORAL_TABLET | Freq: Four times a day (QID) | ORAL | Status: DC | PRN
  Administered 2014-11-26 – 2014-11-28 (×5): 500 mg via ORAL
  Filled 2014-11-26 (×5): qty 1

## 2014-11-26 MED ORDER — BUPIVACAINE HCL 0.25 % IJ SOLN
INTRAMUSCULAR | Status: DC | PRN
  Administered 2014-11-26: 20 mL

## 2014-11-26 MED ORDER — EPHEDRINE SULFATE 50 MG/ML IJ SOLN
INTRAMUSCULAR | Status: AC
  Filled 2014-11-26: qty 1

## 2014-11-26 MED ORDER — SODIUM CHLORIDE 0.9 % IJ SOLN
INTRAMUSCULAR | Status: AC
  Filled 2014-11-26: qty 10

## 2014-11-26 MED ORDER — FENTANYL CITRATE (PF) 100 MCG/2ML IJ SOLN
25.0000 ug | INTRAMUSCULAR | Status: DC | PRN
  Administered 2014-11-26: 50 ug via INTRAVENOUS

## 2014-11-26 MED ORDER — LACTATED RINGERS IV SOLN
INTRAVENOUS | Status: DC
  Administered 2014-11-26 (×2): via INTRAVENOUS

## 2014-11-26 MED ORDER — DEXAMETHASONE SODIUM PHOSPHATE 10 MG/ML IJ SOLN
INTRAMUSCULAR | Status: AC
  Filled 2014-11-26: qty 1

## 2014-11-26 MED ORDER — PROPOFOL 10 MG/ML IV BOLUS
INTRAVENOUS | Status: DC | PRN
  Administered 2014-11-26: 160 mg via INTRAVENOUS

## 2014-11-26 MED ORDER — KCL IN DEXTROSE-NACL 20-5-0.9 MEQ/L-%-% IV SOLN
INTRAVENOUS | Status: DC
  Administered 2014-11-26 – 2014-11-27 (×2): via INTRAVENOUS
  Filled 2014-11-26 (×3): qty 1000

## 2014-11-26 MED ORDER — ONDANSETRON HCL 4 MG/2ML IJ SOLN
4.0000 mg | Freq: Four times a day (QID) | INTRAMUSCULAR | Status: DC | PRN
  Administered 2014-11-26: 4 mg via INTRAVENOUS
  Filled 2014-11-26: qty 2

## 2014-11-26 MED ORDER — DIPHENHYDRAMINE HCL 12.5 MG/5ML PO ELIX: 12.5000 mg | ORAL_SOLUTION | ORAL | Status: DC | PRN

## 2014-11-26 MED ORDER — SUCCINYLCHOLINE CHLORIDE 20 MG/ML IJ SOLN
INTRAMUSCULAR | Status: DC | PRN
  Administered 2014-11-26: 100 mg via INTRAVENOUS

## 2014-11-26 MED ORDER — BUPIVACAINE LIPOSOME 1.3 % IJ SUSP
INTRAMUSCULAR | Status: DC | PRN
  Administered 2014-11-26: 20 mL

## 2014-11-26 MED ORDER — GLYCOPYRROLATE 0.2 MG/ML IJ SOLN
INTRAMUSCULAR | Status: DC | PRN
  Administered 2014-11-26: .6 mg via INTRAVENOUS

## 2014-11-26 MED ORDER — PHENOL 1.4 % MT LIQD: 1.0000 | OROMUCOSAL | Status: DC | PRN

## 2014-11-26 MED ORDER — BISACODYL 10 MG RE SUPP: 10.0000 mg | Freq: Every day | RECTAL | Status: DC | PRN

## 2014-11-26 MED ORDER — FENTANYL CITRATE (PF) 100 MCG/2ML IJ SOLN
INTRAMUSCULAR | Status: DC | PRN
  Administered 2014-11-26 (×2): 100 ug via INTRAVENOUS

## 2014-11-26 MED ORDER — DEXAMETHASONE SODIUM PHOSPHATE 10 MG/ML IJ SOLN
INTRAMUSCULAR | Status: DC | PRN
  Administered 2014-11-26: 10 mg via INTRAVENOUS

## 2014-11-26 MED ORDER — DEXAMETHASONE SODIUM PHOSPHATE 10 MG/ML IJ SOLN
10.0000 mg | Freq: Once | INTRAMUSCULAR | Status: AC
  Administered 2014-11-27: 10 mg via INTRAVENOUS
  Filled 2014-11-26: qty 1

## 2014-11-26 MED ORDER — SODIUM CHLORIDE 0.9 % IJ SOLN
INTRAMUSCULAR | Status: AC
  Filled 2014-11-26: qty 50

## 2014-11-26 MED ORDER — ACETAMINOPHEN 650 MG RE SUPP: 650.0000 mg | Freq: Four times a day (QID) | RECTAL | Status: DC | PRN

## 2014-11-26 MED ORDER — KETOROLAC TROMETHAMINE 15 MG/ML IJ SOLN
7.5000 mg | Freq: Four times a day (QID) | INTRAMUSCULAR | Status: AC | PRN
  Administered 2014-11-26: 7.5 mg via INTRAVENOUS
  Filled 2014-11-26: qty 1

## 2014-11-26 MED ORDER — METOCLOPRAMIDE HCL 10 MG PO TABS: 5.0000 mg | ORAL_TABLET | Freq: Three times a day (TID) | ORAL | Status: DC | PRN

## 2014-11-26 MED ORDER — VANCOMYCIN HCL IN DEXTROSE 1-5 GM/200ML-% IV SOLN
1000.0000 mg | Freq: Two times a day (BID) | INTRAVENOUS | Status: AC
  Administered 2014-11-26: 1000 mg via INTRAVENOUS
  Filled 2014-11-26: qty 200

## 2014-11-26 MED ORDER — CISATRACURIUM BESYLATE (PF) 10 MG/5ML IV SOLN
INTRAVENOUS | Status: DC | PRN
  Administered 2014-11-26: 6 mg via INTRAVENOUS

## 2014-11-26 MED ORDER — RIVAROXABAN 10 MG PO TABS
10.0000 mg | ORAL_TABLET | Freq: Every day | ORAL | Status: DC
  Administered 2014-11-27 – 2014-11-28 (×2): 10 mg via ORAL
  Filled 2014-11-26 (×3): qty 1

## 2014-11-26 MED ORDER — SODIUM CHLORIDE 0.9 % IV SOLN: INTRAVENOUS | Status: DC

## 2014-11-26 MED ORDER — TRANEXAMIC ACID 1000 MG/10ML IV SOLN
1000.0000 mg | INTRAVENOUS | Status: AC
  Administered 2014-11-26: 1000 mg via INTRAVENOUS
  Filled 2014-11-26: qty 10

## 2014-11-26 MED ORDER — ACETAMINOPHEN 10 MG/ML IV SOLN
1000.0000 mg | Freq: Once | INTRAVENOUS | Status: AC
  Administered 2014-11-26: 1000 mg via INTRAVENOUS
  Filled 2014-11-26: qty 100

## 2014-11-26 MED ORDER — POLYETHYLENE GLYCOL 3350 17 G PO PACK: 17.0000 g | PACK | Freq: Every day | ORAL | Status: DC | PRN

## 2014-11-26 MED ORDER — ONDANSETRON HCL 4 MG PO TABS
4.0000 mg | ORAL_TABLET | Freq: Four times a day (QID) | ORAL | Status: DC | PRN
  Administered 2014-11-27: 4 mg via ORAL
  Filled 2014-11-26: qty 1

## 2014-11-26 MED ORDER — MIDAZOLAM HCL 2 MG/2ML IJ SOLN
INTRAMUSCULAR | Status: AC
  Filled 2014-11-26: qty 2

## 2014-11-26 MED ORDER — DEXTROSE 5 % IV SOLN
500.0000 mg | Freq: Four times a day (QID) | INTRAVENOUS | Status: DC | PRN
  Administered 2014-11-26: 500 mg via INTRAVENOUS
  Filled 2014-11-26 (×2): qty 5

## 2014-11-26 MED ORDER — FLEET ENEMA 7-19 GM/118ML RE ENEM: 1.0000 | ENEMA | Freq: Once | RECTAL | Status: AC | PRN

## 2014-11-26 MED ORDER — ONDANSETRON HCL 4 MG/2ML IJ SOLN
INTRAMUSCULAR | Status: AC
  Filled 2014-11-26: qty 2

## 2014-11-26 MED ORDER — OXYCODONE HCL 5 MG PO TABS
5.0000 mg | ORAL_TABLET | ORAL | Status: DC | PRN
  Administered 2014-11-26 – 2014-11-28 (×9): 10 mg via ORAL
  Filled 2014-11-26 (×10): qty 2

## 2014-11-26 MED ORDER — VANCOMYCIN HCL IN DEXTROSE 1-5 GM/200ML-% IV SOLN
1000.0000 mg | Freq: Once | INTRAVENOUS | Status: DC
  Filled 2014-11-26: qty 200

## 2014-11-26 MED ORDER — NEOSTIGMINE METHYLSULFATE 10 MG/10ML IV SOLN
INTRAVENOUS | Status: AC
  Filled 2014-11-26: qty 1

## 2014-11-26 MED ORDER — SODIUM CHLORIDE 0.9 % IJ SOLN
INTRAMUSCULAR | Status: DC | PRN
  Administered 2014-11-26: 30 mL

## 2014-11-26 MED ORDER — 0.9 % SODIUM CHLORIDE (POUR BTL) OPTIME
TOPICAL | Status: DC | PRN
  Administered 2014-11-26: 1000 mL

## 2014-11-26 MED ORDER — ACETAMINOPHEN 325 MG PO TABS: 650.0000 mg | ORAL_TABLET | Freq: Four times a day (QID) | ORAL | Status: DC | PRN

## 2014-11-26 SURGICAL SUPPLY — 63 items
BAG DECANTER FOR FLEXI CONT (MISCELLANEOUS) ×2 IMPLANT
BAG ZIPLOCK 12X15 (MISCELLANEOUS) ×2 IMPLANT
BANDAGE ELASTIC 6 VELCRO ST LF (GAUZE/BANDAGES/DRESSINGS) ×2 IMPLANT
BANDAGE ESMARK 6X9 LF (GAUZE/BANDAGES/DRESSINGS) ×1 IMPLANT
BLADE SAG 18X100X1.27 (BLADE) ×2 IMPLANT
BLADE SAW SGTL 11.0X1.19X90.0M (BLADE) ×2 IMPLANT
BNDG ESMARK 6X9 LF (GAUZE/BANDAGES/DRESSINGS) ×2
BOWL SMART MIX CTS (DISPOSABLE) ×2 IMPLANT
CAP KNEE TOTAL 3 SIGMA ×2 IMPLANT
CEMENT HV SMART SET (Cement) ×4 IMPLANT
CUFF TOURN SGL QUICK 34 (TOURNIQUET CUFF) ×1
CUFF TRNQT CYL 34X4X40X1 (TOURNIQUET CUFF) ×1 IMPLANT
DECANTER SPIKE VIAL GLASS SM (MISCELLANEOUS) ×2 IMPLANT
DRAPE EXTREMITY T 121X128X90 (DRAPE) ×2 IMPLANT
DRAPE POUCH INSTRU U-SHP 10X18 (DRAPES) ×2 IMPLANT
DRAPE U-SHAPE 47X51 STRL (DRAPES) ×2 IMPLANT
DRSG ADAPTIC 3X8 NADH LF (GAUZE/BANDAGES/DRESSINGS) ×2 IMPLANT
DRSG PAD ABDOMINAL 8X10 ST (GAUZE/BANDAGES/DRESSINGS) ×2 IMPLANT
DURAPREP 26ML APPLICATOR (WOUND CARE) ×2 IMPLANT
ELECT REM PT RETURN 9FT ADLT (ELECTROSURGICAL) ×2
ELECTRODE REM PT RTRN 9FT ADLT (ELECTROSURGICAL) ×1 IMPLANT
EVACUATOR 1/8 PVC DRAIN (DRAIN) ×2 IMPLANT
FACESHIELD WRAPAROUND (MASK) ×10 IMPLANT
GAUZE SPONGE 4X4 12PLY STRL (GAUZE/BANDAGES/DRESSINGS) ×2 IMPLANT
GLOVE BIO SURGEON STRL SZ7.5 (GLOVE) IMPLANT
GLOVE BIO SURGEON STRL SZ8 (GLOVE) ×2 IMPLANT
GLOVE BIOGEL PI IND STRL 6.5 (GLOVE) ×2 IMPLANT
GLOVE BIOGEL PI IND STRL 7.0 (GLOVE) ×2 IMPLANT
GLOVE BIOGEL PI IND STRL 8 (GLOVE) ×1 IMPLANT
GLOVE BIOGEL PI INDICATOR 6.5 (GLOVE) ×2
GLOVE BIOGEL PI INDICATOR 7.0 (GLOVE) ×2
GLOVE BIOGEL PI INDICATOR 8 (GLOVE) ×1
GLOVE SURG SS PI 6.5 STRL IVOR (GLOVE) ×4 IMPLANT
GLOVE SURG SS PI 7.0 STRL IVOR (GLOVE) ×2 IMPLANT
GOWN STRL REUS W/TWL LRG LVL3 (GOWN DISPOSABLE) ×6 IMPLANT
GOWN STRL REUS W/TWL XL LVL3 (GOWN DISPOSABLE) ×2 IMPLANT
HANDPIECE INTERPULSE COAX TIP (DISPOSABLE) ×1
IMMOBILIZER KNEE 20 (SOFTGOODS) ×2
IMMOBILIZER KNEE 20 THIGH 36 (SOFTGOODS) ×1 IMPLANT
KIT BASIN OR (CUSTOM PROCEDURE TRAY) ×2 IMPLANT
MANIFOLD NEPTUNE II (INSTRUMENTS) ×2 IMPLANT
NDL SAFETY ECLIPSE 18X1.5 (NEEDLE) ×2 IMPLANT
NEEDLE HYPO 18GX1.5 SHARP (NEEDLE) ×2
NS IRRIG 1000ML POUR BTL (IV SOLUTION) ×2 IMPLANT
PACK TOTAL JOINT (CUSTOM PROCEDURE TRAY) ×2 IMPLANT
PADDING CAST COTTON 6X4 STRL (CAST SUPPLIES) ×2 IMPLANT
PEN SKIN MARKING BROAD (MISCELLANEOUS) ×2 IMPLANT
POSITIONER SURGICAL ARM (MISCELLANEOUS) ×2 IMPLANT
SET HNDPC FAN SPRY TIP SCT (DISPOSABLE) ×1 IMPLANT
STRIP CLOSURE SKIN 1/2X4 (GAUZE/BANDAGES/DRESSINGS) ×2 IMPLANT
SUCTION FRAZIER 12FR DISP (SUCTIONS) ×2 IMPLANT
SUT MNCRL AB 4-0 PS2 18 (SUTURE) ×2 IMPLANT
SUT VIC AB 2-0 CT1 27 (SUTURE) ×3
SUT VIC AB 2-0 CT1 TAPERPNT 27 (SUTURE) ×3 IMPLANT
SUT VLOC 180 0 24IN GS25 (SUTURE) ×4 IMPLANT
SYR 20CC LL (SYRINGE) ×2 IMPLANT
SYR 50ML LL SCALE MARK (SYRINGE) ×2 IMPLANT
TOWEL OR 17X26 10 PK STRL BLUE (TOWEL DISPOSABLE) ×2 IMPLANT
TOWEL OR NON WOVEN STRL DISP B (DISPOSABLE) ×2 IMPLANT
TRAY FOLEY W/METER SILVER 14FR (SET/KITS/TRAYS/PACK) ×2 IMPLANT
WATER STERILE IRR 1500ML POUR (IV SOLUTION) ×2 IMPLANT
WRAP KNEE MAXI GEL POST OP (GAUZE/BANDAGES/DRESSINGS) ×2 IMPLANT
YANKAUER SUCT BULB TIP 10FT TU (MISCELLANEOUS) ×2 IMPLANT

## 2014-11-26 NOTE — Progress Notes (Signed)
Utilization review completed.  

## 2014-11-26 NOTE — Interval H&P Note (Signed)
History and Physical Interval Note:  11/26/2014 7:00 AM  Lauren English  has presented today for surgery, with the diagnosis of OA LEFT  KNEE   The various methods of treatment have been discussed with the patient and family. After consideration of risks, benefits and other options for treatment, the patient has consented to  Procedure(s): TOTAL LEFT   KNEE ARTHROPLASTY (Left) as a surgical intervention .  The patient's history has been reviewed, patient examined, no change in status, stable for surgery.  I have reviewed the patient's chart and labs.  Questions were answered to the patient's satisfaction.     Loanne DrillingALUISIO,Clarece Drzewiecki V

## 2014-11-26 NOTE — Progress Notes (Signed)
Orthopedic Tech Progress Note Patient Details:  Fonda KinderJanet G Urquidi Nov 11, 1940 [id number]  CPM Left Knee CPM Left Knee: Off Left Knee Flexion (Degrees): 10 Left Knee Extension (Degrees): 40 Additional Comments: Trapeze bar   Cammer, Mickie BailJennifer Carol 11/26/2014, 2:10 PM

## 2014-11-26 NOTE — Progress Notes (Signed)
Orthopedic Tech Progress Note Patient Details:  Fonda KinderJanet G Scheaffer 02-Aug-1940 [id number]  CPM Left Knee CPM Left Knee: On Left Knee Flexion (Degrees): 10 Left Knee Extension (Degrees): 40 Additional Comments: Trapeze bar   Cammer, Mickie BailJennifer Carol 11/26/2014, 11:17 AM

## 2014-11-26 NOTE — Transfer of Care (Signed)
Immediate Anesthesia Transfer of Care Note  Patient: Lauren English  Procedure(s) Performed: Procedure(s): TOTAL LEFT   KNEE ARTHROPLASTY (Left)  Patient Location: PACU  Anesthesia Type:General  Level of Consciousness:  sedated, patient cooperative and responds to stimulation  Airway & Oxygen Therapy:Patient Spontanous Breathing and Patient connected to face mask oxgen  Post-op Assessment:  Report given to PACU RN and Post -op Vital signs reviewed and stable  Post vital signs:  Reviewed and stable  Last Vitals:  Filed Vitals:   11/26/14 0701  BP: 153/74  Pulse: 74  Temp: 36.8 C  Resp: 16    Complications: No apparent anesthesia complications

## 2014-11-26 NOTE — Progress Notes (Signed)
PT Cancellation Note  Patient Details Name: Lauren English MRN: [id number] DOB: 1940-09-27   Cancelled Treatment:    Reason Eval/Treat Not Completed: Pain limiting ability to participate. Will follow.    Ralene BatheUhlenberg, Meeka Cartelli Kistler 11/26/2014, 2:19 PM [phone]

## 2014-11-26 NOTE — Op Note (Signed)
Pre-operative diagnosis- Osteoarthritis  Left knee(s)  Post-operative diagnosis- Osteoarthritis Left knee(s)  Procedure-  Left  Total Knee Arthroplasty  Surgeon- Lauren Rankin. Tereza Gilham, MD  Assistant- Dimitri Ped, PA-C   Anesthesia-  General  EBL-* No blood loss amount entered *   Drains Hemovac  Tourniquet time-  Total Tourniquet Time Documented: Thigh (Left) - 38 minutes Total: Thigh (Left) - 38 minutes     Complications- None  Condition-PACU - hemodynamically stable.   Brief Clinical Note  Lauren English is a 74 y.o. year old female with end stage OA of her left knee with progressively worsening pain and dysfunction. She has constant pain, with activity and at rest and significant functional deficits with difficulties even with ADLs. She has had extensive non-op management including analgesics, injections of cortisone and viscosupplements, and home exercise program, but remains in significant pain with significant dysfunction. Radiographs show bone on bone arthritis lateral and patellofemoral with chronic subluxation laterally of the patella.. She presents now for left Total Knee Arthroplasty.    Procedure in detail---   The patient is brought into the operating room and positioned supine on the operating table. After successful administration of  General,   a tourniquet is placed high on the  Left thigh(s) and the lower extremity is prepped and draped in the usual sterile fashion. Time out is performed by the operating team and then the  Left lower extremity is wrapped in Esmarch, knee flexed and the tourniquet inflated to 300 mmHg.       A midline incision is made with a ten blade through the subcutaneous tissue to the level of the extensor mechanism. A fresh blade is used to make a medial parapatellar arthrotomy. Soft tissue over the proximal medial tibia is subperiosteally elevated to the joint line with a knife and into the semimembranosus bursa with a Cobb elevator. Soft tissue  over the proximal lateral tibia is elevated with attention being paid to avoiding the patellar tendon on the tibial tubercle. The patella is everted, knee flexed 90 degrees and the ACL and PCL are removed. Findings are bone on bone lateral and patellofemoral with a dislocated patella and massive global osteophytes.        The drill is used to create a starting hole in the distal femur and the canal is thoroughly irrigated with sterile saline to remove the fatty contents. The 5 degree Left  valgus alignment guide is placed into the femoral canal and the distal femoral cutting block is pinned to remove 10 mm off the distal femur. Resection is made with an oscillating saw.      The tibia is subluxed forward and the menisci are removed. The extramedullary alignment guide is placed referencing proximally at the medial aspect of the tibial tubercle and distally along the second metatarsal axis and tibial crest. The block is pinned to remove 2mm off the more deficient lateral  side. Resection is made with an oscillating saw. Size 4is the most appropriate size for the tibia and the proximal tibia is prepared with the modular drill and keel punch for that size.      The femoral sizing guide is placed and size 4 is most appropriate. Rotation is marked off the epicondylar axis and confirmed by creating a rectangular flexion gap at 90 degrees. The size 4 cutting block is pinned in this rotation and the anterior, posterior and chamfer cuts are made with the oscillating saw. The intercondylar block is then placed and that cut is  made.      Trial size 4 tibial component, trial size 4 posterior stabilized femur and a 12.5  mm posterior stabilized rotating platform insert trial is placed. Full extension is achieved with excellent varus/valgus and anterior/posterior balance throughout full range of motion. The patella is everted and thickness measured to be 21  mm. Free hand resection is taken to 12 mm, a 35 template is placed,  lug holes are drilled, trial patella is placed, and it tracks normally. Osteophytes are removed off the posterior femur with the trial in place. All trials are removed and the cut bone surfaces prepared with pulsatile lavage. Cement is mixed and once ready for implantation, the size 4 tibial implant, size  4 narrow posterior stabilized femoral component, and the size 35 patella are cemented in place and the patella is held with the clamp. The trial insert is placed and the knee held in full extension. The Exparel (20 ml mixed with 30 ml saline) and .25% Bupivicaine, are injected into the extensor mechanism, posterior capsule, medial and lateral gutters and subcutaneous tissues.  All extruded cement is removed and once the cement is hard the permanent 12.5 mm posterior stabilized rotating platform insert is placed into the tibial tray.      The wound is copiously irrigated with saline solution and the extensor mechanism closed over a hemovac drain with #1 V-loc suture. Given her prior history of lateral patellar instability I performed a lateral release and oversewed the soft tissues medially to keep the patella centered in the trochlea and track normally.The tourniquet is released for a total tourniquet time of 37  minutes. Flexion against gravity is 130 degrees and the patella tracks normally. Subcutaneous tissue is closed with 2.0 vicryl and subcuticular with running 4.0 Monocryl. The incision is cleaned and dried and steri-strips and a bulky sterile dressing are applied. The limb is placed into a knee immobilizer and the patient is awakened and transported to recovery in stable condition.      Please note that a surgical assistant was a medical necessity for this procedure in order to perform it in a safe and expeditious manner. Surgical assistant was necessary to retract the ligaments and vital neurovascular structures to prevent injury to them and also necessary for proper positioning of the limb to allow for  anatomic placement of the prosthesis.   Lauren RankinFrank V. Malvin Morrish, MD    11/26/2014, 10:17 AM

## 2014-11-26 NOTE — Anesthesia Procedure Notes (Signed)
Procedure Name: Intubation Date/Time: 11/26/2014 9:16 AM Performed by: Leroy LibmanEARDON, Lauren English Patient Re-evaluated:Patient Re-evaluated prior to inductionOxygen Delivery Method: Circle system utilized Preoxygenation: Pre-oxygenation with 100% oxygen Intubation Type: IV induction Ventilation: Mask ventilation without difficulty Laryngoscope Size: Glidescope and 4 Grade View: Grade I Tube type: Oral Tube size: 7.5 mm Number of attempts: 2 Airway Equipment and Method: Video-laryngoscopy Placement Confirmation: ETT inserted through vocal cords under direct vision,  breath sounds checked- equal and bilateral and positive ETCO2 Secured at: 21 cm Tube secured with: Tape Dental Injury: Bloody posterior oropharynx  Difficulty Due To: Difficulty was anticipated, Difficult Airway- due to reduced neck mobility and Difficult Airway- due to anterior larynx Future Recommendations: Recommend- induction with short-acting agent, and alternative techniques readily available

## 2014-11-26 NOTE — Anesthesia Preprocedure Evaluation (Addendum)
Anesthesia Evaluation  Patient identified by MRN, date of birth, ID band Patient awake    Reviewed: Allergy & Precautions, NPO status , Patient's Chart, lab work & pertinent test results  History of Anesthesia Complications (+) PONV and history of anesthetic complications  Airway Mallampati: III  TM Distance: >3 FB Neck ROM: Limited    Dental no notable dental hx. (+) Dental Advisory Given, Poor Dentition   Pulmonary sleep apnea and Continuous Positive Airway Pressure Ventilation ,  breath sounds clear to auscultation  Pulmonary exam normal       Cardiovascular negative cardio ROS Normal cardiovascular examRhythm:Regular Rate:Normal     Neuro/Psych negative neurological ROS  negative psych ROS   GI/Hepatic negative GI ROS, Neg liver ROS,   Endo/Other  negative endocrine ROS  Renal/GU negative Renal ROS  negative genitourinary   Musculoskeletal  (+) Arthritis -, Osteoarthritis,    Abdominal   Peds negative pediatric ROS (+)  Hematology negative hematology ROS (+)   Anesthesia Other Findings   Reproductive/Obstetrics negative OB ROS                            Anesthesia Physical Anesthesia Plan  ASA: II  Anesthesia Plan: General   Post-op Pain Management:    Induction: Intravenous  Airway Management Planned: Oral ETT and Video Laryngoscope Planned  Additional Equipment:   Intra-op Plan:   Post-operative Plan:   Informed Consent: I have reviewed the patients History and Physical, chart, labs and discussed the procedure including the risks, benefits and alternatives for the proposed anesthesia with the patient or authorized representative who has indicated his/her understanding and acceptance.   Dental advisory given  Plan Discussed with: CRNA  Anesthesia Plan Comments: (Hx of limited neck mobility with cervical surgery in past. Hx of lumbar hardware and patient would prefer  general anesthesia. Hx of grade 3 view on prior surgery therefore will position in sniffing position and use video laryngoscope for intubation.)       Anesthesia Quick Evaluation

## 2014-11-26 NOTE — Anesthesia Postprocedure Evaluation (Signed)
  Anesthesia Post-op Note  Patient: Lauren English  Procedure(s) Performed: Procedure(s) (LRB): TOTAL LEFT   KNEE ARTHROPLASTY (Left)  Patient Location: PACU  Anesthesia Type: General  Level of Consciousness: awake and alert   Airway and Oxygen Therapy: Patient Spontanous Breathing  Post-op Pain: mild  Post-op Assessment: Post-op Vital signs reviewed, Patient's Cardiovascular Status Stable, Respiratory Function Stable, Patent Airway and No signs of Nausea or vomiting  Last Vitals:  Filed Vitals:   11/26/14 1504  BP: 134/56  Pulse: 61  Temp: 36.4 C  Resp: 14    Post-op Vital Signs: stable   Complications: No apparent anesthesia complications

## 2014-11-27 LAB — BASIC METABOLIC PANEL
Anion gap: 5 (ref 5–15)
BUN: 10 mg/dL (ref 6–20)
CALCIUM: 8.1 mg/dL — AB (ref 8.9–10.3)
CO2: 25 mmol/L (ref 22–32)
CREATININE: 0.63 mg/dL (ref 0.44–1.00)
Chloride: 108 mmol/L (ref 101–111)
GFR calc Af Amer: >60 mL/min (ref >60)
GFR calc non Af Amer: >60 mL/min (ref >60)
Glucose, Bld: 134 mg/dL — ABNORMAL HIGH (ref 65–99)
Potassium: 4.2 mmol/L (ref 3.5–5.1)
SODIUM: 138 mmol/L (ref 135–145)

## 2014-11-27 LAB — CBC
HEMATOCRIT: 33 % — AB (ref 36.0–46.0)
HEMOGLOBIN: 10.7 g/dL — AB (ref 12.0–15.0)
MCH: 28.3 pg (ref 26.0–34.0)
MCHC: 32.4 g/dL (ref 30.0–36.0)
MCV: 87.3 fL (ref 78.0–100.0)
PLATELETS: 169 10*3/uL (ref 150–400)
RBC: 3.78 MIL/uL — AB (ref 3.87–5.11)
RDW: 13.9 % (ref 11.5–15.5)
WBC: 7.2 10*3/uL (ref 4.0–10.5)

## 2014-11-27 MED ORDER — OXYCODONE HCL 5 MG PO TABS: 5.0000 mg | ORAL_TABLET | ORAL | Status: DC | PRN

## 2014-11-27 MED ORDER — RIVAROXABAN 10 MG PO TABS: 10.0000 mg | ORAL_TABLET | Freq: Every day | ORAL | Status: DC

## 2014-11-27 MED ORDER — METHOCARBAMOL 500 MG PO TABS: 500.0000 mg | ORAL_TABLET | Freq: Four times a day (QID) | ORAL | Status: DC | PRN

## 2014-11-27 MED ORDER — TRAMADOL HCL 50 MG PO TABS: 50.0000 mg | ORAL_TABLET | Freq: Four times a day (QID) | ORAL | Status: DC | PRN

## 2014-11-27 NOTE — Discharge Instructions (Addendum)
° °Dr. Frank Aluisio °Total Joint Specialist °[facility] °[address] °[place], [place] [address] °[phone] ° °TOTAL KNEE REPLACEMENT POSTOPERATIVE DIRECTIONS ° °Knee Rehabilitation, Guidelines Following Surgery  °Results after knee surgery are often greatly improved when you follow the exercise, range of motion and muscle strengthening exercises prescribed by your doctor. Safety measures are also important to protect the knee from further injury. Any time any of these exercises cause you to have increased pain or swelling in your knee joint, decrease the amount until you are comfortable again and slowly increase them. If you have problems or questions, call your caregiver or physical therapist for advice.  ° °HOME CARE INSTRUCTIONS  °Remove items at home which could result in a fall. This includes throw rugs or furniture in walking pathways.  °· ICE to the affected knee every three hours for 30 minutes at a time and then as needed for pain and swelling.  Continue to use ice on the knee for pain and swelling from surgery. You may notice swelling that will progress down to the foot and ankle.  This is normal after surgery.  Elevate the leg when you are not up walking on it.   °· Continue to use the breathing machine which will help keep your temperature down.  It is common for your temperature to cycle up and down following surgery, especially at night when you are not up moving around and exerting yourself.  The breathing machine keeps your lungs expanded and your temperature down. °· Do not place pillow under knee, focus on keeping the knee straight while resting ° °DIET °You may resume your previous home diet once your are discharged from the hospital. ° °DRESSING / WOUND CARE / SHOWERING °You may shower 3 days after surgery, but keep the wounds dry during showering.  You may use an occlusive plastic wrap (Press'n Seal for example), NO SOAKING/SUBMERGING IN THE BATHTUB.  If the  bandage gets wet, change with a clean dry gauze.  If the incision gets wet, pat the wound dry with a clean towel. °You may start showering once you are discharged home but do not submerge the incision under water. Just pat the incision dry and apply a dry gauze dressing on daily. °Change the surgical dressing daily and reapply a dry dressing each time. ° °ACTIVITY °Walk with your walker as instructed. °Use walker as long as suggested by your caregivers. °Avoid periods of inactivity such as sitting longer than an hour when not asleep. This helps prevent blood clots.  °You may resume a sexual relationship in one month or when given the OK by your doctor.  °You may return to work once you are cleared by your doctor.  °Do not drive a car for 6 weeks or until released by you surgeon.  °Do not drive while taking narcotics. ° °WEIGHT BEARING °Weight bearing as tolerated with assist device (walker, cane, etc) as directed, use it as long as suggested by your surgeon or therapist, typically at least 4-6 weeks. ° °POSTOPERATIVE CONSTIPATION PROTOCOL °Constipation - defined medically as fewer than three stools per week and severe constipation as less than one stool per week. ° °One of the most common issues patients have following surgery is constipation.  Even if you have a regular bowel pattern at home, your normal regimen is likely to be disrupted due to multiple reasons following surgery.  Combination of anesthesia, postoperative narcotics, change in appetite and fluid intake all can affect your bowels.    In order to avoid complications following surgery, here are some recommendations in order to help you during your recovery period. ° °Colace (docusate) - Pick up an over-the-counter form of Colace or another stool softener and take twice a day as long as you are requiring postoperative pain medications.  Take with a full glass of water daily.  If you experience loose stools or diarrhea, hold the colace until you stool forms  back up.  If your symptoms do not get better within 1 week or if they get worse, check with your doctor. ° °Dulcolax (bisacodyl) - Pick up over-the-counter and take as directed by the product packaging as needed to assist with the movement of your bowels.  Take with a full glass of water.  Use this product as needed if not relieved by Colace only.  ° °MiraLax (polyethylene glycol) - Pick up over-the-counter to have on hand.  MiraLax is a solution that will increase the amount of water in your bowels to assist with bowel movements.  Take as directed and can mix with a glass of water, juice, soda, coffee, or tea.  Take if you go more than two days without a movement. °Do not use MiraLax more than once per day. Call your doctor if you are still constipated or irregular after using this medication for 7 days in a row. ° °If you continue to have problems with postoperative constipation, please contact the office for further assistance and recommendations.  If you experience "the worst abdominal pain ever" or develop nausea or vomiting, please contact the office immediatly for further recommendations for treatment. ° °ITCHING ° If you experience itching with your medications, try taking only a single pain pill, or even half a pain pill at a time.  You can also use Benadryl over the counter for itching or also to help with sleep.  ° °TED HOSE STOCKINGS °Wear the elastic stockings on both legs for three weeks following surgery during the day but you may remove then at night for sleeping. ° °MEDICATIONS °See your medication summary on the “After Visit Summary” that the nursing staff will review with you prior to discharge.  You may have some home medications which will be placed on hold until you complete the course of blood thinner medication.  It is important for you to complete the blood thinner medication as prescribed by your surgeon.  Continue your approved medications as instructed at time of  discharge. ° °PRECAUTIONS °If you experience chest pain or shortness of breath - call 911 immediately for transfer to the hospital emergency department.  °If you develop a fever greater that 101 F, purulent drainage from wound, increased redness or drainage from wound, foul odor from the wound/dressing, or calf pain - CONTACT YOUR SURGEON.   °                                                °FOLLOW-UP APPOINTMENTS °Make sure you keep all of your appointments after your operation with your surgeon and caregivers. You should call the office at the above phone number and make an appointment for approximately two weeks after the date of your surgery or on the date instructed by your surgeon outlined in the "After Visit Summary". ° ° °RANGE OF MOTION AND STRENGTHENING EXERCISES  °Rehabilitation of the knee is important following a knee injury or   an operation. After just a few days of immobilization, the muscles of the thigh which control the knee become weakened and shrink (atrophy). Knee exercises are designed to build up the tone and strength of the thigh muscles and to improve knee motion. Often times heat used for twenty to thirty minutes before working out will loosen up your tissues and help with improving the range of motion but do not use heat for the first two weeks following surgery. These exercises can be done on a training (exercise) mat, on the floor, on a table or on a bed. Use what ever works the best and is most comfortable for you Knee exercises include:  °Leg Lifts - While your knee is still immobilized in a splint or cast, you can do straight leg raises. Lift the leg to 60 degrees, hold for 3 sec, and slowly lower the leg. Repeat 10-20 times 2-3 times daily. Perform this exercise against resistance later as your knee gets better.  °Quad and Hamstring Sets - Tighten up the muscle on the front of the thigh (Quad) and hold for 5-10 sec. Repeat this 10-20 times hourly. Hamstring sets are done by pushing the  foot backward against an object and holding for 5-10 sec. Repeat as with quad sets.  °· Leg Slides: Lying on your back, slowly slide your foot toward your buttocks, bending your knee up off the floor (only go as far as is comfortable). Then slowly slide your foot back down until your leg is flat on the floor again. °· Angel Wings: Lying on your back spread your legs to the side as far apart as you can without causing discomfort.  °A rehabilitation program following serious knee injuries can speed recovery and prevent re-injury in the future due to weakened muscles. Contact your doctor or a physical therapist for more information on knee rehabilitation.  ° °IF YOU ARE TRANSFERRED TO A SKILLED REHAB FACILITY °If the patient is transferred to a skilled rehab facility following release from the hospital, a list of the current medications will be sent to the facility for the patient to continue.  When discharged from the skilled rehab facility, please have the facility set up the patient's Home Health Physical Therapy prior to being released. Also, the skilled facility will be responsible for providing the patient with their medications at time of release from the facility to include their pain medication, the muscle relaxants, and their blood thinner medication. If the patient is still at the rehab facility at time of the two week follow up appointment, the skilled rehab facility will also need to assist the patient in arranging follow up appointment in our office and any transportation needs. ° °MAKE SURE YOU:  °Understand these instructions.  °Get help right away if you are not doing well or get worse.  ° ° °Pick up stool softner and laxative for home use following surgery while on pain medications. °Do not submerge incision under water. °Please use good hand washing techniques while changing dressing each day. °May shower starting three days after surgery. °Please use a clean towel to pat the incision dry following  showers. °Continue to use ice for pain and swelling after surgery. °Do not use any lotions or creams on the incision until instructed by your surgeon.\ ° °Take Xarelto for two and a half more weeks, then discontinue Xarelto. °Once the patient has completed the blood thinner regimen, then take a Baby 81 mg Aspirin daily for three more weeks. ° ° °Information   on my medicine - XARELTO® (Rivaroxaban) ° °This medication education was reviewed with me or my healthcare representative as part of my discharge preparation.  The pharmacist that spoke with me during my hospital stay was:  Legge, Justin Marshall, RPH ° °Why was Xarelto® prescribed for you? °Xarelto® was prescribed for you to reduce the risk of blood clots forming after orthopedic surgery. The medical term for these abnormal blood clots is venous thromboembolism (VTE). ° °What do you need to know about xarelto® ? °Take your Xarelto® ONCE DAILY at the same time every day. °You may take it either with or without food. ° °If you have difficulty swallowing the tablet whole, you may crush it and mix in applesauce just prior to taking your dose. ° °Take Xarelto® exactly as prescribed by your doctor and DO NOT stop taking Xarelto® without talking to the doctor who prescribed the medication.  Stopping without other VTE prevention medication to take the place of Xarelto® may increase your risk of developing a clot. ° °After discharge, you should have regular check-up appointments with your healthcare provider that is prescribing your Xarelto®.   ° °What do you do if you miss a dose? °If you miss a dose, take it as soon as you remember on the same day then continue your regularly scheduled once daily regimen the next day. Do not take two doses of Xarelto® on the same day.  ° °Important Safety Information °A possible side effect of Xarelto® is bleeding. You should call your healthcare provider right away if you experience any of the following: °? Bleeding from an injury or  your nose that does not stop. °? Unusual colored urine (red or dark brown) or unusual colored stools (red or black). °? Unusual bruising for unknown reasons. °? A serious fall or if you hit your head (even if there is no bleeding). ° °Some medicines may interact with Xarelto® and might increase your risk of bleeding while on Xarelto®. To help avoid this, consult your healthcare provider or pharmacist prior to using any new prescription or non-prescription medications, including herbals, vitamins, non-steroidal anti-inflammatory drugs (NSAIDs) and supplements. ° °This website has more information on Xarelto®: www.xarelto.com. ° ° ° °

## 2014-11-27 NOTE — Evaluation (Signed)
Occupational Therapy Evaluation Patient Details Name: Lauren English MRN: [id number] DOB: [birth year] Today's Date: 11/27/2014    History of Present Illness Pt is a 74 year old female s/p L TKA   Clinical Impression   Pt was admitted for the above surgery. All education was completed.  No further OT is needed at this time.    Follow Up Recommendations  No OT follow up    Equipment Recommendations  None recommended by OT    Recommendations for Other Services       Precautions / Restrictions Precautions Precautions: Knee Required Braces or Orthoses: Knee Immobilizer - Left Restrictions Weight Bearing Restrictions: No Other Position/Activity Restrictions: WBAT      Mobility Bed Mobility Overal bed mobility: Needs Assistance Bed Mobility: Supine to Sit     Supine to sit: Supervision;HOB elevated     General bed mobility comments: oob  Transfers Overall transfer level: Needs assistance Equipment used: Rolling walker (2 wheeled) Transfers: Sit to/from Stand Sit to Stand: Min guard         General transfer comment: for safety:  pt cued herself    Balance                                            ADL Overall ADL's : Needs assistance/impaired     Grooming: Wash/dry hands;Standing;Supervision/safety   Upper Body Bathing: Set up;Standing   Lower Body Bathing: Sit to/from stand;Minimal assistance   Upper Body Dressing : Set up;Sitting   Lower Body Dressing: Moderate assistance;Sit to/from stand   Toilet Transfer: Min guard;Ambulation;BSC   Toileting- ArchitectClothing Manipulation and Hygiene: Min guard;Sit to/from stand   Tub/ Shower Transfer: Walk-in shower;Min guard;Ambulation     General ADL Comments: ambulated to bathroom with min guard and practiced toilet and shower transfers.  [facility]  Pt has DME and reacher from when she had back sx.  Husband will assist as needed       Vision     Perception     Praxis       Pertinent Vitals/Pain Pain Assessment: 0-10 Pain Score: 2  Pain Location: L knee Pain Descriptors / Indicators: Sore Pain Intervention(s): Limited activity within patient's tolerance;Monitored during session;Premedicated before session;Repositioned;Ice applied     Hand Dominance     Extremity/Trunk Assessment Upper Extremity Assessment Upper Extremity Assessment: Overall WFL for tasks assessed          Communication Communication Communication: No difficulties   Cognition Arousal/Alertness: Awake/alert Behavior During Therapy: WFL for tasks assessed/performed Overall Cognitive Status: Within Functional Limits for tasks assessed                     General Comments       Exercises       Shoulder Instructions      Home Living Family/patient expects to be discharged to:: Private residence Living Arrangements: Spouse/significant other Available Help at Discharge: Family Type of Home: House Home Access: Stairs to enter Secretary/administratorntrance Stairs-Number of Steps: 3 Entrance Stairs-Rails: None Home Layout: Able to live on main level with bedroom/bathroom     Bathroom Shower/Tub: Producer, television/film/videoWalk-in shower   Bathroom Toilet: Standard     Home Equipment: Toilet riser;Shower seat;Bedside commode          Prior Functioning/Environment Level of Independence: Independent  OT Diagnosis: Generalized weakness   OT Problem List:     OT Treatment/Interventions:      OT Goals(Current goals can be found in the care plan section)    OT Frequency:     Barriers to D/C:            Co-evaluation              End of Session CPM Left Knee CPM Left Knee: Off  Activity Tolerance: Patient tolerated treatment well Patient left: in chair;with call bell/phone within reach;with family/visitor present   Time: 1610-9604 OT Time Calculation (min): 24 min Charges:  OT General Charges $OT Visit: 1 Procedure OT Evaluation $Initial OT Evaluation Tier I: 1  Procedure OT Treatments $Self Care/Home Management : 8-22 mins G-Codes:    Baby Gieger 27-Dec-2014, 11:25 AM  Marica Otter, OTR/L [phone] Dec 27, 2014

## 2014-11-27 NOTE — Progress Notes (Signed)
Physical Therapy Treatment Note    11/27/14 1500  PT Visit Information  Last PT Received On 11/27/14  Assistance Needed +1  History of Present Illness Pt is a 74 year old female s/p L TKA  PT Time Calculation  PT Start Time (ACUTE ONLY) 1344  PT Stop Time (ACUTE ONLY) 1356  PT Time Calculation (min) (ACUTE ONLY) 12 min  Subjective Data  Subjective Pt ambulated in hallway again and then assisted back to bed.  Precautions  Precautions Knee  Precaution Comments able to perform SLR  Restrictions  Other Position/Activity Restrictions WBAT  Pain Assessment  Pain Assessment 0-10  Pain Score 3  Pain Location L knee and thigh  Pain Descriptors / Indicators Sore  Pain Intervention(s) Limited activity within patient's tolerance;Monitored during session;Repositioned;Ice applied  Cognition  Arousal/Alertness Awake/alert  Behavior During Therapy WFL for tasks assessed/performed  Overall Cognitive Status Within Functional Limits for tasks assessed  Bed Mobility  Overal bed mobility Needs Assistance  Bed Mobility Sit to Supine  Sit to supine Min guard  General bed mobility comments verbal cues for self assist  Transfers  Overall transfer level Needs assistance  Equipment used Rolling walker (2 wheeled)  Transfers Sit to/from Stand  Sit to Stand Min guard  General transfer comment verbal cues for LE positioning, good hand placement  Ambulation/Gait  Ambulation/Gait assistance Min guard  Ambulation Distance (Feet) 160 Feet  Assistive device Rolling walker (2 wheeled)  Gait Pattern/deviations Step-to pattern;Antalgic  General Gait Details verbal cues for step length, RW positioning, posture  PT - End of Session  Activity Tolerance Patient tolerated treatment well  Patient left in bed;with call bell/phone within reach  PT - Assessment/Plan  PT Plan Current plan remains appropriate  PT Frequency (ACUTE ONLY) 7X/week  Follow Up Recommendations Home health PT  PT equipment Rolling  walker with 5" wheels  PT Goal Progression  Progress towards PT goals Progressing toward goals  PT General Charges  $$ ACUTE PT VISIT 1 Procedure  PT Treatments  $Gait Training 8-22 mins   Zenovia JarredKati Tayva Easterday, PT, DPT 11/27/2014 Pager: [phone]

## 2014-11-27 NOTE — Progress Notes (Signed)
   Subjective: 1 Day Post-Op Procedure(s) (LRB): TOTAL LEFT   KNEE ARTHROPLASTY (Left) Patient reports pain as mild.   Patient seen in rounds with Dr. Lequita HaltAluisio.  Doing okay today. Patient is well, but has had some minor complaints of pain in the knee, requiring pain medications We will start therapy today.  Plan is to go Home after hospital stay.  Objective: Vital signs in last 24 hours: Temp:  [97.5 F (36.4 C)-98.3 F (36.8 C)] 97.9 F (36.6 C) (07/19 0450) Pulse Rate:  [57-88] 63 (07/19 0450) Resp:  [10-17] 16 (07/19 0450) BP: (91-143)/(51-71) 130/54 mmHg (07/19 0450) SpO2:  [94 %-100 %] 100 % (07/19 0450)  Intake/Output from previous day:  Intake/Output Summary (Last 24 hours) at 11/27/14 0912 Last data filed at 11/27/14 0821  Gross per 24 hour  Intake   4200 ml  Output   2165 ml  Net   2035 ml    Intake/Output this shift: Total I/O In: 240 [P.O.:240] Out: -   Labs:  Recent Labs  11/27/14 0405  HGB 10.7*    Recent Labs  11/27/14 0405  WBC 7.2  RBC 3.78*  HCT 33.0*  PLT 169    Recent Labs  11/27/14 0405  NA 138  K 4.2  CL 108  CO2 25  BUN 10  CREATININE 0.63  GLUCOSE 134*  CALCIUM 8.1*   No results for input(s): LABPT, INR in the last 72 hours.  EXAM General - Patient is Alert, Appropriate and Oriented Extremity - Neurovascular intact Sensation intact distally Dorsiflexion/Plantar flexion intact Dressing - dressing C/D/I Motor Function - intact, moving foot and toes well on exam.  Hemovac pulled without difficulty.  Past Medical History  Diagnosis Date  . PONV (postoperative nausea and vomiting)   . Bladder incontinence   . OSA on CPAP   . Arthritis     "qwhere; feet, knees, neck" (04/21/2012)  . Chronic back pain   . Closed angle glaucoma     "both eyes" (04/21/2012)  . Prediabetes   . Urinary tract bacterial infections   . Overactive bladder   . Numbness     fingertips bilat and feet bilat   . Peripheral neuropathy    feet bilat     Assessment/Plan: 1 Day Post-Op Procedure(s) (LRB): TOTAL LEFT   KNEE ARTHROPLASTY (Left) Principal Problem:   OA (osteoarthritis) of knee  Estimated body mass index is 33.43 kg/(m^2) as calculated from the following:   Height as of this encounter: 5\' 10"  (1.778 m).   Weight as of this encounter: 105.688 kg (233 lb). Advance diet Up with therapy Plan for discharge tomorrow Discharge home with home health  DVT Prophylaxis - Xarelto Weight-Bearing as tolerated to left leg D/C O2 and Pulse OX and try on Room Air  Avel Peacerew Britanee Vanblarcom, PA-C Orthopaedic Surgery 11/27/2014, 9:12 AM

## 2014-11-27 NOTE — Discharge Summary (Signed)
Physician Discharge Summary   Patient ID: Lauren English MRN: [id number] DOB/AGE: 1940/10/26 74 y.o.  Admit date: 11/26/2014 Discharge date: 11/28/2014  Primary Diagnosis:  Osteoarthritis Left knee(s)  Admission Diagnoses:  Past Medical History  Diagnosis Date  . PONV (postoperative nausea and vomiting)   . Bladder incontinence   . OSA on CPAP   . Arthritis     "qwhere; feet, knees, neck" (04/21/2012)  . Chronic back pain   . Closed angle glaucoma     "both eyes" (04/21/2012)  . Prediabetes   . Urinary tract bacterial infections   . Overactive bladder   . Numbness     fingertips bilat and feet bilat   . Peripheral neuropathy     feet bilat    Discharge Diagnoses:   Principal Problem:   OA (osteoarthritis) of knee  Estimated body mass index is 33.43 kg/(m^2) as calculated from the following:   Height as of this encounter: 5' 10"  (1.778 m).   Weight as of this encounter: 105.688 kg (233 lb).  Procedure:  Procedure(s) (LRB): TOTAL LEFT   KNEE ARTHROPLASTY (Left)   Consults: None  HPI: Lauren English is a 74 y.o. year old female with end stage OA of her left knee with progressively worsening pain and dysfunction. She has constant pain, with activity and at rest and significant functional deficits with difficulties even with ADLs. She has had extensive non-op management including analgesics, injections of cortisone and viscosupplements, and home exercise program, but remains in significant pain with significant dysfunction. Radiographs show bone on bone arthritis lateral and patellofemoral with chronic subluxation laterally of the patella.. She presents now for left Total Knee Arthroplasty.   Laboratory Data: Admission on 11/26/2014  Component Date Value Ref Range Status  . WBC 11/27/2014 7.2  4.0 - 10.5 K/uL Final  . RBC 11/27/2014 3.78* 3.87 - 5.11 MIL/uL Final  . Hemoglobin 11/27/2014 10.7* 12.0 - 15.0 g/dL Final  . HCT 11/27/2014 33.0* 36.0 - 46.0 % Final  . MCV  11/27/2014 87.3  78.0 - 100.0 fL Final  . MCH 11/27/2014 28.3  26.0 - 34.0 pg Final  . MCHC 11/27/2014 32.4  30.0 - 36.0 g/dL Final  . RDW 11/27/2014 13.9  11.5 - 15.5 % Final  . Platelets 11/27/2014 169  150 - 400 K/uL Final  . Sodium 11/27/2014 138  135 - 145 mmol/L Final  . Potassium 11/27/2014 4.2  3.5 - 5.1 mmol/L Final  . Chloride 11/27/2014 108  101 - 111 mmol/L Final  . CO2 11/27/2014 25  22 - 32 mmol/L Final  . Glucose, Bld 11/27/2014 134* 65 - 99 mg/dL Final  . BUN 11/27/2014 10  6 - 20 mg/dL Final  . Creatinine, Ser 11/27/2014 0.63  0.44 - 1.00 mg/dL Final  . Calcium 11/27/2014 8.1* 8.9 - 10.3 mg/dL Final  . GFR calc non Af Amer 11/27/2014 >60  >60 mL/min Final  . GFR calc Af Amer 11/27/2014 >60  >60 mL/min Final   Comment: (NOTE) The eGFR has been calculated using the CKD EPI equation. This calculation has not been validated in all clinical situations. eGFR's persistently <60 mL/min signify possible Chronic Kidney Disease.   Georgiann Hahn gap 11/27/2014 5  5 - 15 Final  Hospital Outpatient Visit on 11/19/2014  Component Date Value Ref Range Status  . aPTT 11/19/2014 29  24 - 37 seconds Final  . WBC 11/19/2014 6.6  4.0 - 10.5 K/uL Final  . RBC 11/19/2014 4.76  3.87 - 5.11  MIL/uL Final  . Hemoglobin 11/19/2014 13.3  12.0 - 15.0 g/dL Final  . HCT 11/19/2014 40.8  36.0 - 46.0 % Final  . MCV 11/19/2014 85.7  78.0 - 100.0 fL Final  . MCH 11/19/2014 27.9  26.0 - 34.0 pg Final  . MCHC 11/19/2014 32.6  30.0 - 36.0 g/dL Final  . RDW 11/19/2014 13.7  11.5 - 15.5 % Final  . Platelets 11/19/2014 194  150 - 400 K/uL Final  . Sodium 11/19/2014 139  135 - 145 mmol/L Final  . Potassium 11/19/2014 4.0  3.5 - 5.1 mmol/L Final  . Chloride 11/19/2014 105  101 - 111 mmol/L Final  . CO2 11/19/2014 24  22 - 32 mmol/L Final  . Glucose, Bld 11/19/2014 93  65 - 99 mg/dL Final  . BUN 11/19/2014 17  6 - 20 mg/dL Final  . Creatinine, Ser 11/19/2014 0.84  0.44 - 1.00 mg/dL Final  . Calcium 11/19/2014  9.0  8.9 - 10.3 mg/dL Final  . Total Protein 11/19/2014 7.0  6.5 - 8.1 g/dL Final  . Albumin 11/19/2014 4.0  3.5 - 5.0 g/dL Final  . AST 11/19/2014 20  15 - 41 U/L Final  . ALT 11/19/2014 18  14 - 54 U/L Final  . Alkaline Phosphatase 11/19/2014 49  38 - 126 U/L Final  . Total Bilirubin 11/19/2014 0.5  0.3 - 1.2 mg/dL Final  . GFR calc non Af Amer 11/19/2014 >60  >60 mL/min Final  . GFR calc Af Amer 11/19/2014 >60  >60 mL/min Final   Comment: (NOTE) The eGFR has been calculated using the CKD EPI equation. This calculation has not been validated in all clinical situations. eGFR's persistently <60 mL/min signify possible Chronic Kidney Disease.   . Anion gap 11/19/2014 10  5 - 15 Final  . Prothrombin Time 11/19/2014 13.2  11.6 - 15.2 seconds Final  . INR 11/19/2014 0.98  0.00 - 1.49 Final  . ABO/RH(D) 11/19/2014 O POS   Final  . Antibody Screen 11/19/2014 NEG   Final  . Sample Expiration 11/19/2014 11/29/2014   Final  . Color, Urine 11/19/2014 YELLOW  YELLOW Final  . APPearance 11/19/2014 CLOUDY* CLEAR Final  . Specific Gravity, Urine 11/19/2014 1.023  1.005 - 1.030 Final  . pH 11/19/2014 7.0  5.0 - 8.0 Final  . Glucose, UA 11/19/2014 NEGATIVE  NEGATIVE mg/dL Final  . Hgb urine dipstick 11/19/2014 NEGATIVE  NEGATIVE Final  . Bilirubin Urine 11/19/2014 NEGATIVE  NEGATIVE Final  . Ketones, ur 11/19/2014 NEGATIVE  NEGATIVE mg/dL Final  . Protein, ur 11/19/2014 NEGATIVE  NEGATIVE mg/dL Final  . Urobilinogen, UA 11/19/2014 1.0  0.0 - 1.0 mg/dL Final  . Nitrite 11/19/2014 NEGATIVE  NEGATIVE Final  . Leukocytes, UA 11/19/2014 LARGE* NEGATIVE Final  . MRSA, PCR 11/19/2014 POSITIVE* NEGATIVE Final   AFTERHOURS  . Staphylococcus aureus 11/19/2014 POSITIVE* NEGATIVE Final   Comment:        The Xpert SA Assay (FDA approved for NASAL specimens in patients over 81 years of age), is one component of a comprehensive surveillance program.  Test performance has been validated by [facility]  for patients greater than or equal to 82 year old. It is not intended to diagnose infection nor to guide or monitor treatment.   . Squamous Epithelial / LPF 11/19/2014 MANY* RARE Final  . WBC, UA 11/19/2014 21-50  <3 WBC/hpf Final  . Bacteria, UA 11/19/2014 FEW* RARE Final  . ABO/RH(D) 11/19/2014 O POS   Final  X-Rays:No results found.  EKG: Orders placed or performed during the hospital encounter of 11/19/14  . EKG 12-Lead  . EKG 12-Lead     Hospital Course: Lauren English is a 74 y.o. who was admitted to [facility]. They were brought to the operating room on 11/26/2014 and underwent Procedure(s): TOTAL LEFT   KNEE ARTHROPLASTY.  Patient tolerated the procedure well and was later transferred to the recovery room and then to the orthopaedic floor for postoperative care.  They were given PO and IV analgesics for pain control following their surgery.  They were given 24 hours of postoperative antibiotics of  Anti-infectives    Start     Dose/Rate Route Frequency Ordered Stop   11/26/14 2100  vancomycin (VANCOCIN) IVPB 1000 mg/200 mL premix     1,000 mg 200 mL/hr over 60 Minutes Intravenous Every 12 hours 11/26/14 1224 11/26/14 2109   11/26/14 0700  vancomycin (VANCOCIN) IVPB 1000 mg/200 mL premix  Status:  Discontinued     1,000 mg 200 mL/hr over 60 Minutes Intravenous  Once 11/26/14 0636 11/26/14 0644   11/26/14 0700  ceFAZolin (ANCEF) IVPB 2 g/50 mL premix  Status:  Discontinued     2 g 100 mL/hr over 30 Minutes Intravenous On call to O.R. 11/26/14 0636 11/26/14 0830   11/26/14 0700  vancomycin (VANCOCIN) 1,500 mg in sodium chloride 0.9 % 500 mL IVPB     1,500 mg 250 mL/hr over 120 Minutes Intravenous  Once 11/26/14 0645 11/26/14 1035     and started on DVT prophylaxis in the form of Xarelto.   PT and OT were ordered for total joint protocol.  Discharge planning consulted to help with postop disposition and equipment needs.  Patient had a good night on the evening of  surgery.  They started to get up OOB with therapy on day one. Hemovac drain was pulled without difficulty.  Continued to work with therapy into day two.  Dressing was changed on day two and the incision was healing well. Patient was seen in rounds and was ready to go home.  Discharge home with home health Diet - Diabetic diet Follow up - in 2 weeks Activity - WBAT Disposition - Home Condition Upon Discharge - Good D/C Meds - See DC Summary DVT Prophylaxis - Xarelto  Discharge Instructions    Call MD / Call 911    Complete by:  As directed   If you experience chest pain or shortness of breath, CALL 911 and be transported to the hospital emergency room.  If you develope a fever above 101 F, pus (white drainage) or increased drainage or redness at the wound, or calf pain, call your surgeon's office.     Change dressing    Complete by:  As directed   Change dressing daily with sterile 4 x 4 inch gauze dressing and apply TED hose. Do not submerge the incision under water.     Constipation Prevention    Complete by:  As directed   Drink plenty of fluids.  Prune juice may be helpful.  You may use a stool softener, such as Colace (over the counter) 100 mg twice a day.  Use MiraLax (over the counter) for constipation as needed.     Diet general    Complete by:  As directed      Discharge instructions    Complete by:  As directed   Pick up stool softner and laxative for home use following surgery while on pain  medications. Do not submerge incision under water. Please use good hand washing techniques while changing dressing each day. May shower starting three days after surgery. Please use a clean towel to pat the incision dry following showers. Continue to use ice for pain and swelling after surgery. Do not use any lotions or creams on the incision until instructed by your surgeon.  Take Xarelto for two and a half more weeks, then discontinue Xarelto. Once the patient has completed the blood  thinner regimen, then take a Baby 81 mg Aspirin daily for three more weeks.  Postoperative Constipation Protocol  Constipation - defined medically as fewer than three stools per week and severe constipation as less than one stool per week.  One of the most common issues patients have following surgery is constipation.  Even if you have a regular bowel pattern at home, your normal regimen is likely to be disrupted due to multiple reasons following surgery.  Combination of anesthesia, postoperative narcotics, change in appetite and fluid intake all can affect your bowels.  In order to avoid complications following surgery, here are some recommendations in order to help you during your recovery period.  Colace (docusate) - Pick up an over-the-counter form of Colace or another stool softener and take twice a day as long as you are requiring postoperative pain medications.  Take with a full glass of water daily.  If you experience loose stools or diarrhea, hold the colace until you stool forms back up.  If your symptoms do not get better within 1 week or if they get worse, check with your doctor.  Dulcolax (bisacodyl) - Pick up over-the-counter and take as directed by the product packaging as needed to assist with the movement of your bowels.  Take with a full glass of water.  Use this product as needed if not relieved by Colace only.   MiraLax (polyethylene glycol) - Pick up over-the-counter to have on hand.  MiraLax is a solution that will increase the amount of water in your bowels to assist with bowel movements.  Take as directed and can mix with a glass of water, juice, soda, coffee, or tea.  Take if you go more than two days without a movement. Do not use MiraLax more than once per day. Call your doctor if you are still constipated or irregular after using this medication for 7 days in a row.  If you continue to have problems with postoperative constipation, please contact the office for further  assistance and recommendations.  If you experience "the worst abdominal pain ever" or develop nausea or vomiting, please contact the office immediatly for further recommendations for treatment.     Do not put a pillow under the knee. Place it under the heel.    Complete by:  As directed      Do not sit on low chairs, stoools or toilet seats, as it may be difficult to get up from low surfaces    Complete by:  As directed      Driving restrictions    Complete by:  As directed   No driving until released by the physician.     Increase activity slowly as tolerated    Complete by:  As directed      Lifting restrictions    Complete by:  As directed   No lifting until released by the physician.     Patient may shower    Complete by:  As directed   You may shower without a  dressing once there is no drainage.  Do not wash over the wound.  If drainage remains, do not shower until drainage stops.     TED hose    Complete by:  As directed   Use stockings (TED hose) for 3 weeks on both leg(s).  You may remove them at night for sleeping.     Weight bearing as tolerated    Complete by:  As directed   Laterality:  left  Extremity:  Lower            Medication List    STOP taking these medications        ACETYL L-CARNITINE PO     CALCIUM 600 + D PO     Glucosamine-Chondroitin 750-600 MG Tabs     HYDROcodone-acetaminophen 10-325 MG per tablet  Commonly known as:  NORCO     Turmeric 500 MG Caps     Vitamin D 2000 UNITS tablet      TAKE these medications        docusate sodium 100 MG capsule  Commonly known as:  COLACE  Take 100 mg by mouth 2 (two) times daily.     methocarbamol 500 MG tablet  Commonly known as:  ROBAXIN  Take 1 tablet (500 mg total) by mouth every 6 (six) hours as needed for muscle spasms.     OVER THE COUNTER MEDICATION  Take 2 tablets by mouth every evening.     oxyCODONE 5 MG immediate release tablet  Commonly known as:  Oxy IR/ROXICODONE  Take 1-2  tablets (5-10 mg total) by mouth every 3 (three) hours as needed for moderate pain or severe pain.     rivaroxaban 10 MG Tabs tablet  Commonly known as:  XARELTO  Take 1 tablet (10 mg total) by mouth daily with breakfast. Take Xarelto for two and a half more weeks, then discontinue Xarelto. Once the patient has completed the blood thinner regimen, then take a Baby 81 mg Aspirin daily for three more weeks.     solifenacin 10 MG tablet  Commonly known as:  VESICARE  Take 10 mg by mouth daily.     traMADol 50 MG tablet  Commonly known as:  ULTRAM  Take 1-2 tablets (50-100 mg total) by mouth every 6 (six) hours as needed (mild pain).           Follow-up Information    Follow up with [facility].   Why:  home health physical therapy   Contact information:   [place] [address] [phone]       Follow up with Gearlean Alf, MD. Schedule an appointment as soon as possible for a visit on 12/11/2014.   Specialty:  Orthopedic Surgery   Why:  Call office at [phone] to setup followup appointment in Tuesday 12/11/2014 with Dr. Wynelle Link.   Contact information:   [address] [place] [address] [phone]       Signed: Arlee Muslim, PA-C Orthopaedic Surgery 11/27/2014, 9:41 PM

## 2014-11-27 NOTE — Care Management Note (Signed)
Case Management Note  Patient Details  Name: Lauren English MRN: [id number] Date of Birth: [birth year]  Subjective/Objective:                  TAL LEFT KNEE ARTHROPLASTY (Left)  Action/Plan: Discharge planning  Expected Discharge Date:  11/28/14              Expected Discharge Plan:  [place]  In-House Referral:     Discharge planning Services  CM Consult  Post Acute Care Choice:  Home Health Choice offered to:  Patient  DME Arranged:    DME Agency:     HH Arranged:  PT HH Agency:  [facility]  Status of Service:  Completed, signed off  Medicare Important Message Given:    Date Medicare IM Given:    Medicare IM give by:    Date Additional Medicare IM Given:    Additional Medicare Important Message give by:     If discussed at [place] of Stay Meetings, dates discussed:    Additional Comments: CM met with pt in room to offer choice of home health agency.  Pt chooses [facility] to render HHPT.  Address and contact information verified by pt.  Referral accepted by [facility] rep, Tim.  No DME needed as she is borrowing everything she needs from her church.  No other CM needs were communicated.  Dellie Catholic, RN 11/27/2014, 3:41 PM

## 2014-11-27 NOTE — Evaluation (Signed)
Physical Therapy Evaluation Patient Details Name: Lauren English MRN: [id number] DOB: 10-Nov-1940 Today's Date: 11/27/2014   History of Present Illness  Pt is a 74 year old female s/p L TKA  Clinical Impression  Pt is s/p L TKA resulting in the deficits listed below (see PT Problem List).  Pt will benefit from skilled PT to increase their independence and safety with mobility to allow discharge to the venue listed below.  Pt mobilizing well POD#1 and plans to d/c home with family assist.     Follow Up Recommendations Home health PT    Equipment Recommendations  Rolling walker with 5" wheels    Recommendations for Other Services       Precautions / Restrictions Precautions Precautions: Knee Required Braces or Orthoses: Knee Immobilizer - Left Restrictions Weight Bearing Restrictions: No Other Position/Activity Restrictions: WBAT      Mobility  Bed Mobility Overal bed mobility: Needs Assistance Bed Mobility: Supine to Sit     Supine to sit: Supervision;HOB elevated     General bed mobility comments: verbal cues for technique  Transfers Overall transfer level: Needs assistance Equipment used: Rolling walker (2 wheeled) Transfers: Sit to/from Stand Sit to Stand: Min guard         General transfer comment: verbal cues for safe technique  Ambulation/Gait Ambulation/Gait assistance: Min guard Ambulation Distance (Feet): 120 Feet Assistive device: Rolling walker (2 wheeled) Gait Pattern/deviations: Step-to pattern;Antalgic     General Gait Details: verbal cues for sequence, step length, RW positioning, posture (pt reports she tends to perform slight trunk flexion due to previous back surgery)  Stairs            Wheelchair Mobility    Modified Rankin (Stroke Patients Only)       Balance                                             Pertinent Vitals/Pain Pain Assessment: 0-10 Pain Score: 2  Pain Location: L knee Pain Descriptors  / Indicators: Sore (stiff) Pain Intervention(s): Monitored during session;Limited activity within patient's tolerance;Premedicated before session;Repositioned;Ice applied    Home Living Family/patient expects to be discharged to:: Private residence Living Arrangements: Spouse/significant other Available Help at Discharge: Family Type of Home: House Home Access: Stairs to enter Entrance Stairs-Rails: None Entrance Stairs-Number of Steps: 3 Home Layout: Able to live on main level with bedroom/bathroom Home Equipment: Cane - single point      Prior Function Level of Independence: Independent               Hand Dominance        Extremity/Trunk Assessment               Lower Extremity Assessment: LLE deficits/detail   LLE Deficits / Details: able to perform SLR, approx 50* of AAROM L knee flexion observed during heel slides     Communication   Communication: No difficulties  Cognition Arousal/Alertness: Awake/alert Behavior During Therapy: WFL for tasks assessed/performed Overall Cognitive Status: Within Functional Limits for tasks assessed                      General Comments      Exercises Total Joint Exercises Ankle Circles/Pumps: AROM;Both;15 reps Quad Sets: AROM;Both;15 reps Short Arc Quad: AROM;Left;10 reps Heel Slides: AAROM;Left;10 reps Hip ABduction/ADduction: AROM;Left;10 reps Straight Leg Raises: AROM;Left;10 reps  Assessment/Plan    PT Assessment Patient needs continued PT services  PT Diagnosis Difficulty walking;Acute pain   PT Problem List Decreased strength;Decreased range of motion;Decreased mobility;Decreased knowledge of use of DME;Pain  PT Treatment Interventions Functional mobility training;Therapeutic activities;Therapeutic exercise;Gait training;DME instruction;Stair training;Patient/family education   PT Goals (Current goals can be found in the Care Plan section) Acute Rehab PT Goals PT Goal Formulation: With  patient Time For Goal Achievement: 12/01/14 Potential to Achieve Goals: Good    Frequency 7X/week   Barriers to discharge        Co-evaluation               End of Session Equipment Utilized During Treatment: Gait belt;Left knee immobilizer Activity Tolerance: Patient tolerated treatment well Patient left: in chair;with call bell/phone within reach;with family/visitor present           Time: [id number] PT Time Calculation (min) (ACUTE ONLY): 27 min   Charges:   PT Evaluation $Initial PT Evaluation Tier I: 1 Procedure PT Treatments $Therapeutic Exercise: 8-22 mins   PT G Codes:        Mahagony Grieb,KATHrine E 11/27/2014, 11:14 AM Zenovia JarredKati Ruffin Lada, PT, DPT 11/27/2014 Pager: [phone]

## 2014-11-28 LAB — BASIC METABOLIC PANEL
Anion gap: 4 — ABNORMAL LOW (ref 5–15)
BUN: 13 mg/dL (ref 6–20)
CO2: 28 mmol/L (ref 22–32)
CREATININE: 0.78 mg/dL (ref 0.44–1.00)
Calcium: 8.2 mg/dL — ABNORMAL LOW (ref 8.9–10.3)
Chloride: 109 mmol/L (ref 101–111)
GFR calc Af Amer: >60 mL/min (ref >60)
GLUCOSE: 106 mg/dL — AB (ref 65–99)
POTASSIUM: 4 mmol/L (ref 3.5–5.1)
Sodium: 141 mmol/L (ref 135–145)

## 2014-11-28 LAB — CBC
HEMATOCRIT: 29.5 % — AB (ref 36.0–46.0)
HEMOGLOBIN: 9.5 g/dL — AB (ref 12.0–15.0)
MCH: 28 pg (ref 26.0–34.0)
MCHC: 32.2 g/dL (ref 30.0–36.0)
MCV: 87 fL (ref 78.0–100.0)
Platelets: 174 10*3/uL (ref 150–400)
RBC: 3.39 MIL/uL — ABNORMAL LOW (ref 3.87–5.11)
RDW: 14 % (ref 11.5–15.5)
WBC: 6.8 10*3/uL (ref 4.0–10.5)

## 2014-11-28 NOTE — Progress Notes (Signed)
Pt d/c to home. Discharge paperwork, reasons to return to ED/MD, follow up appts, prescriptions, and after visit care discussed with pt and daughter at bedside. Pt acknowledged understanding of paperwork and RN and pt signed discharge summary. Pt to be escorted off of unit by wheelchair to daughter's care and car in main lobby.

## 2014-11-28 NOTE — Care Management Important Message (Signed)
Important Message  Patient Details letter given to Sandra/ daughter going in roomImportant Message  Patient Details  Name: Lauren English MRN: [id number] Date of Birth: 1941/03/19   Medicare Important Message Given:  Yes-second notification given    Haskell FlirtJamison, Marguarite Markov 11/28/2014, 12:31 PM Name: Lauren English MRN: [id number] Date of Birth: 1941/03/19   Medicare Important Message Given:  Yes-second notification given    Haskell FlirtJamison, Navraj Dreibelbis 11/28/2014, 12:31 PM

## 2014-11-28 NOTE — Progress Notes (Signed)
Physical Therapy Treatment Patient Details Name: Fonda KinderJanet G Bhat MRN: [id number] DOB: 1941-02-03 Today's Date: 11/28/2014    History of Present Illness Pt is a 74 year old female s/p L TKA    PT Comments    Pt ambulated in hallway, practiced safe stair technique, and performed exercises.  Pt feels ready for d/c home today.  Follow Up Recommendations  Home health PT     Equipment Recommendations  Rolling walker with 5" wheels    Recommendations for Other Services       Precautions / Restrictions Precautions Precautions: Knee Precaution Comments: able to perform SLR Restrictions Other Position/Activity Restrictions: WBAT    Mobility  Bed Mobility Overal bed mobility: Needs Assistance Bed Mobility: Supine to Sit     Supine to sit: Supervision        Transfers Overall transfer level: Needs assistance Equipment used: Rolling walker (2 wheeled) Transfers: Sit to/from Stand Sit to Stand: Supervision         General transfer comment: good technique  Ambulation/Gait Ambulation/Gait assistance: Supervision Ambulation Distance (Feet): 200 Feet Assistive device: Rolling walker (2 wheeled) Gait Pattern/deviations: Step-through pattern;Decreased step length - right;Antalgic     General Gait Details: verbal cues for step length, RW positioning, posture   Stairs Stairs: Yes Stairs assistance: Min guard Stair Management: Step to pattern;Backwards;With walker Number of Stairs: 2 General stair comments: verbal cues for sequence, safety, RW positioning, daughter assisted with holding RW, performed twice  Wheelchair Mobility    Modified Rankin (Stroke Patients Only)       Balance                                    Cognition Arousal/Alertness: Awake/alert Behavior During Therapy: WFL for tasks assessed/performed Overall Cognitive Status: Within Functional Limits for tasks assessed                      Exercises Total Joint  Exercises Ankle Circles/Pumps: AROM;Both;15 reps Quad Sets: AROM;Both;15 reps Towel Squeeze: AROM;Both;15 reps Short Arc Quad: AROM;Left;10 reps Heel Slides: AAROM;Left;10 reps Hip ABduction/ADduction: AROM;Left;10 reps Straight Leg Raises: AROM;Left;10 reps    General Comments        Pertinent Vitals/Pain Pain Assessment: 0-10 Pain Score: 3  Pain Location: L knee and thigh Pain Descriptors / Indicators: Aching;Sore Pain Intervention(s): Limited activity within patient's tolerance;Monitored during session;Repositioned;Ice applied;Premedicated before session    Home Living                      Prior Function            PT Goals (current goals can now be found in the care plan section) Progress towards PT goals: Progressing toward goals    Frequency  7X/week    PT Plan Current plan remains appropriate    Co-evaluation             End of Session   Activity Tolerance: Patient tolerated treatment well Patient left: with call bell/phone within reach;with family/visitor present;in chair     Time: 0940-1003 PT Time Calculation (min) (ACUTE ONLY): 23 min  Charges:  $Gait Training: 8-22 mins $Therapeutic Exercise: 8-22 mins                    G Codes:      Adrienna Karis,KATHrine E 11/28/2014, 12:46 PM Zenovia JarredKati Kemper Hochman, PT, DPT 11/28/2014 Pager: [phone]

## 2014-11-28 NOTE — Progress Notes (Signed)
   Subjective: 2 Days Post-Op Procedure(s) (LRB): TOTAL LEFT   KNEE ARTHROPLASTY (Left) Patient reports pain as mild.   Patient seen in rounds for Dr. Lequita HaltAluisio.  Family in room. Patient is well, but has had some minor complaints of pain in the knee, requiring pain medications Patient is ready to go home  Objective: Vital signs in last 24 hours: Temp:  [98.2 F (36.8 C)-98.3 F (36.8 C)] 98.2 F (36.8 C) (07/20 0434) Pulse Rate:  [67-70] 69 (07/20 0434) Resp:  [14-16] 14 (07/20 0434) BP: (122-134)/(51-58) 122/58 mmHg (07/20 0434) SpO2:  [93 %-98 %] 93 % (07/20 0434)  Intake/Output from previous day:  Intake/Output Summary (Last 24 hours) at 11/28/14 1052 Last data filed at 11/28/14 0900  Gross per 24 hour  Intake   1200 ml  Output   1250 ml  Net    -50 ml    Intake/Output this shift: Total I/O In: 240 [P.O.:240] Out: -   Labs:  Recent Labs  11/27/14 0405 11/28/14 0420  HGB 10.7* 9.5*    Recent Labs  11/27/14 0405 11/28/14 0420  WBC 7.2 6.8  RBC 3.78* 3.39*  HCT 33.0* 29.5*  PLT 169 174    Recent Labs  11/27/14 0405 11/28/14 0420  NA 138 141  K 4.2 4.0  CL 108 109  CO2 25 28  BUN 10 13  CREATININE 0.63 0.78  GLUCOSE 134* 106*  CALCIUM 8.1* 8.2*   No results for input(s): LABPT, INR in the last 72 hours.  EXAM: General - Patient is Alert and Appropriate Extremity - Neurovascular intact Sensation intact distally Incision - clean, dry Motor Function - intact, moving foot and toes well on exam.   Assessment/Plan: 2 Days Post-Op Procedure(s) (LRB): TOTAL LEFT   KNEE ARTHROPLASTY (Left) Procedure(s) (LRB): TOTAL LEFT   KNEE ARTHROPLASTY (Left) Past Medical History  Diagnosis Date  . PONV (postoperative nausea and vomiting)   . Bladder incontinence   . OSA on CPAP   . Arthritis     "qwhere; feet, knees, neck" (04/21/2012)  . Chronic back pain   . Closed angle glaucoma     "both eyes" (04/21/2012)  . Prediabetes   . Urinary tract  bacterial infections   . Overactive bladder   . Numbness     fingertips bilat and feet bilat   . Peripheral neuropathy     feet bilat    Principal Problem:   OA (osteoarthritis) of knee  Estimated body mass index is 33.43 kg/(m^2) as calculated from the following:   Height as of this encounter: 5\' 10"  (1.778 m).   Weight as of this encounter: 105.688 kg (233 lb). Up with therapy Discharge home with home health Diet - Diabetic diet Follow up - in 2 weeks Activity - WBAT Disposition - Home Condition Upon Discharge - Good D/C Meds - See DC Summary DVT Prophylaxis - Xarelto  Avel Peacerew Ebbie Sorenson, PA-C Orthopaedic Surgery 11/28/2014, 10:52 AM

## 2014-11-29 DIAGNOSIS — Z471 Aftercare following joint replacement surgery: Secondary | ICD-10-CM | POA: Diagnosis not present

## 2014-11-29 DIAGNOSIS — Z981 Arthrodesis status: Secondary | ICD-10-CM | POA: Diagnosis not present

## 2014-11-29 DIAGNOSIS — Z96652 Presence of left artificial knee joint: Secondary | ICD-10-CM | POA: Diagnosis not present

## 2014-11-29 DIAGNOSIS — Z6833 Body mass index (BMI) 33.0-33.9, adult: Secondary | ICD-10-CM | POA: Diagnosis not present

## 2014-11-29 DIAGNOSIS — H4040X0 Glaucoma secondary to eye inflammation, unspecified eye, stage unspecified: Secondary | ICD-10-CM | POA: Diagnosis not present

## 2014-11-29 DIAGNOSIS — G629 Polyneuropathy, unspecified: Secondary | ICD-10-CM | POA: Diagnosis not present

## 2014-11-30 DIAGNOSIS — Z981 Arthrodesis status: Secondary | ICD-10-CM | POA: Diagnosis not present

## 2014-11-30 DIAGNOSIS — Z6833 Body mass index (BMI) 33.0-33.9, adult: Secondary | ICD-10-CM | POA: Diagnosis not present

## 2014-11-30 DIAGNOSIS — Z471 Aftercare following joint replacement surgery: Secondary | ICD-10-CM | POA: Diagnosis not present

## 2014-11-30 DIAGNOSIS — H4040X0 Glaucoma secondary to eye inflammation, unspecified eye, stage unspecified: Secondary | ICD-10-CM | POA: Diagnosis not present

## 2014-11-30 DIAGNOSIS — Z96652 Presence of left artificial knee joint: Secondary | ICD-10-CM | POA: Diagnosis not present

## 2014-11-30 DIAGNOSIS — G629 Polyneuropathy, unspecified: Secondary | ICD-10-CM | POA: Diagnosis not present

## 2014-12-03 DIAGNOSIS — Z471 Aftercare following joint replacement surgery: Secondary | ICD-10-CM | POA: Diagnosis not present

## 2014-12-03 DIAGNOSIS — H4040X0 Glaucoma secondary to eye inflammation, unspecified eye, stage unspecified: Secondary | ICD-10-CM | POA: Diagnosis not present

## 2014-12-03 DIAGNOSIS — Z981 Arthrodesis status: Secondary | ICD-10-CM | POA: Diagnosis not present

## 2014-12-03 DIAGNOSIS — G629 Polyneuropathy, unspecified: Secondary | ICD-10-CM | POA: Diagnosis not present

## 2014-12-03 DIAGNOSIS — Z6833 Body mass index (BMI) 33.0-33.9, adult: Secondary | ICD-10-CM | POA: Diagnosis not present

## 2014-12-03 DIAGNOSIS — Z96652 Presence of left artificial knee joint: Secondary | ICD-10-CM | POA: Diagnosis not present

## 2014-12-04 DIAGNOSIS — Z6833 Body mass index (BMI) 33.0-33.9, adult: Secondary | ICD-10-CM | POA: Diagnosis not present

## 2014-12-04 DIAGNOSIS — G629 Polyneuropathy, unspecified: Secondary | ICD-10-CM | POA: Diagnosis not present

## 2014-12-04 DIAGNOSIS — Z981 Arthrodesis status: Secondary | ICD-10-CM | POA: Diagnosis not present

## 2014-12-04 DIAGNOSIS — Z471 Aftercare following joint replacement surgery: Secondary | ICD-10-CM | POA: Diagnosis not present

## 2014-12-04 DIAGNOSIS — H4040X0 Glaucoma secondary to eye inflammation, unspecified eye, stage unspecified: Secondary | ICD-10-CM | POA: Diagnosis not present

## 2014-12-04 DIAGNOSIS — Z96652 Presence of left artificial knee joint: Secondary | ICD-10-CM | POA: Diagnosis not present

## 2014-12-06 DIAGNOSIS — H4040X0 Glaucoma secondary to eye inflammation, unspecified eye, stage unspecified: Secondary | ICD-10-CM | POA: Diagnosis not present

## 2014-12-06 DIAGNOSIS — Z6833 Body mass index (BMI) 33.0-33.9, adult: Secondary | ICD-10-CM | POA: Diagnosis not present

## 2014-12-06 DIAGNOSIS — G629 Polyneuropathy, unspecified: Secondary | ICD-10-CM | POA: Diagnosis not present

## 2014-12-06 DIAGNOSIS — Z471 Aftercare following joint replacement surgery: Secondary | ICD-10-CM | POA: Diagnosis not present

## 2014-12-06 DIAGNOSIS — Z96652 Presence of left artificial knee joint: Secondary | ICD-10-CM | POA: Diagnosis not present

## 2014-12-06 DIAGNOSIS — Z981 Arthrodesis status: Secondary | ICD-10-CM | POA: Diagnosis not present

## 2014-12-07 ENCOUNTER — Ambulatory Visit: Payer: Medicare Other | Attending: Orthopedic Surgery | Admitting: Physical Therapy

## 2014-12-07 ENCOUNTER — Encounter: Payer: Self-pay | Admitting: Physical Therapy

## 2014-12-07 DIAGNOSIS — R531 Weakness: Secondary | ICD-10-CM | POA: Diagnosis present

## 2014-12-07 DIAGNOSIS — M25562 Pain in left knee: Secondary | ICD-10-CM | POA: Insufficient documentation

## 2014-12-07 DIAGNOSIS — M25662 Stiffness of left knee, not elsewhere classified: Secondary | ICD-10-CM | POA: Diagnosis not present

## 2014-12-07 DIAGNOSIS — R269 Unspecified abnormalities of gait and mobility: Secondary | ICD-10-CM | POA: Diagnosis present

## 2014-12-07 NOTE — Therapy (Addendum)
[facility] [address] [place], [place], [address] Phone: [phone]   Fax:  [phone]  Physical Therapy Evaluation  Patient Details  Name: Lauren English MRN: [id number] Date of Birth: 21-May-1940 Referring Provider:  Gaynelle Arabian, MD  Encounter Date: 12/07/2014      PT End of Session - 12/07/14 1005    Visit Number 1   Date for PT Re-Evaluation 02/01/15   PT Start Time 0930   PT Stop Time 1020   PT Time Calculation (min) 50 min   Activity Tolerance Patient tolerated treatment well   Behavior During Therapy [facility] for tasks assessed/performed      Past Medical History  Diagnosis Date  . PONV (postoperative nausea and vomiting)   . Bladder incontinence   . OSA on CPAP   . Arthritis     "qwhere; feet, knees, neck" (04/21/2012)  . Chronic back pain   . Closed angle glaucoma     "both eyes" (04/21/2012)  . Prediabetes   . Urinary tract bacterial infections   . Overactive bladder   . Numbness     fingertips bilat and feet bilat   . Peripheral neuropathy     feet bilat     Past Surgical History  Procedure Laterality Date  . Back surgery    . Knee arthroplasty  06/2008    "slipped on ice; torn ligaments & cartilage" (04/21/2012)  . Posterior lumbar fusion  04/21/2012  . Tonsillectomy and adenoidectomy  ~ 1950  . Abdominal hysterectomy  1980    "partial" (04/21/2012)  . Bladder suspension  1990  . Anterior fusion cervical spine  12/2005  . Tubal ligation  1970's  . Dilation and curettage of uterus  1980  . Appendectomy    . Total knee arthroplasty Left 11/26/2014    Procedure: TOTAL LEFT   KNEE ARTHROPLASTY;  Surgeon: Gaynelle Arabian, MD;  Location: [facility];  Service: Orthopedics;  Laterality: Left;    There were no vitals filed for this visit.  Visit Diagnosis:  Stiffness of left knee - Plan: PT plan of care cert/re-cert  Left knee pain - Plan: PT plan of care cert/re-cert  Weakness - Plan: PT  plan of care cert/re-cert  Abnormality of gait - Plan: PT plan of care cert/re-cert      Subjective Assessment - 12/07/14 0940    Subjective Patient had total knee surgery on 11/27/2014.  Patient was in the hospital for 2 nights.  Patient had home health PT 2 weeks.    Patient is accompained by: Family member  Husband   Limitations Sitting   How long can you sit comfortably? 20 min.   How long can you stand comfortably? 30 min.   How long can you walk comfortably? with a cane   Patient Stated Goals full use of left knee   Currently in Pain? Yes   Pain Score 2    Pain Location Knee   Pain Orientation Left   Pain Descriptors / Indicators Dull;Aching   Pain Type Surgical pain   Pain Onset 1 to 4 weeks ago   Pain Frequency Constant   Aggravating Factors  sitting, bending left knee, extend knee to stretch   Pain Relieving Factors rest, ice   Effect of Pain on Daily Activities unable to perfrom daily tasks due to left knee limited   Multiple Pain Sites No            [facility] PT Assessment - 12/07/14 0001  Assessment   Medical Diagnosis S/P left total knee arthroplasty   Onset Date/Surgical Date 11/27/14   Next MD Visit 12/11/2014   Prior Therapy home health PT   Precautions   Precautions None   Restrictions   Weight Bearing Restrictions No   Balance Screen   Has the patient fallen in the past 6 months No   Has the patient had a decrease in activity level because of a fear of falling?  No   Is the patient reluctant to leave their home because of a fear of falling?  No   Home Environment   Living Environment Private residence   Living Arrangements Spouse/significant other   Type of [place] to enter   Entrance Stairs-Number of Steps 2   Entrance Stairs-Rails --  getting one installed   Home Layout Multi-level  3 levels   Alternate Level Stairs-Number of Steps 15   Alternate Level Stairs-Rails Can reach both   Prior Function   Level of  Independence Independent with household mobility with device;Independent with basic ADLs   Vocation Retired   Agricultural consultant   Overall Cognitive Status Within Functional Limits for tasks assessed   Observation/Other Assessments   Observations left knee has multiple colors for bruising, steri strips are on, drainage of blood, Patient wears gauze over knee.    Focus on Therapeutic Outcomes (FOTO)  56% limitation   goal 41% limitation   Observation/Other Assessments-Edema    Edema Circumferential   Circumferential Edema   Circumferential - Right 47 cm   Circumferential - Left  52.5cm   ROM / Strength   AROM / PROM / Strength AROM;PROM;Strength   AROM   AROM Assessment Site Knee   Right/Left Knee Left   Left Knee Extension -16   Left Knee Flexion 75   PROM   PROM Assessment Site Knee   Right/Left Knee Left   Left Knee Extension -8   Left Knee Flexion 90   Strength   Strength Assessment Site Knee   Right/Left Knee Left   Left Knee Flexion 4-/5   Left Knee Extension 3-/5   Flexibility   Soft Tissue Assessment /Muscle Length yes  tight gastroc on left   Hamstrings tight left   Quadriceps tight left   Ambulation/Gait   Ambulation/Gait Yes   Ambulation/Gait Assistance 6: Modified independent (Device/Increase time)   Assistive device Straight cane;Rolling walker   Gait Pattern Decreased stance time - left;Decreased hip/knee flexion - left;Decreased step length - right;Decreased dorsiflexion - left;Decreased weight shift to left   Stairs Yes   Stairs Assistance 6: Modified independent (Device/Increase time)   Stair Management Technique Step to pattern   Curb 6: Modified independent (Device/increase time)                   [facility] Adult PT Treatment/Exercise - 12/07/14 0001    Knee/Hip Exercises: Aerobic   Nustep 8 min level 0 seat 11   Modalities   Modalities Vasopneumatic   Vasopneumatic   Number Minutes Vasopneumatic  15 minutes   Vasopnuematic Location   Knee  left elevated   Vasopneumatic Pressure Medium   Vasopneumatic Temperature  3 snow flakes                PT Education - 12/07/14 1012    Education provided No          PT Short Term Goals - 12/07/14 1012    PT SHORT TERM GOAL #  1   Title independent with initial HEP    Time 4   Period Weeks   Status New   PT SHORT TERM GOAL #2   Title left knee pain decreased >/= 25%   Time 4   Period Weeks   Status New   PT SHORT TERM GOAL #3   Title Left knee flexion >/= 100 degrees PROM   Time 4   Period Weeks   Status New   PT SHORT TERM GOAL #4   Title left knee extension -10 degrees AROM   Time 4   Period Weeks   Status New           PT Long Term Goals - 12/07/14 1006    PT LONG TERM GOAL #1   Title independent with HEP and understand how to progress herself   Time 8   Period Weeks   Status New   PT LONG TERM GOAL #2   Title left knee flexion >/= 115 degrees AROM to go up and down stairs alternating   Time 8   Period Weeks   Status New   PT LONG TERM GOAL #3   Title return to driving due to increased left knee motion >/= 115 degrees   Time 8   Period Weeks   Status New   PT LONG TERM GOAL #4   Title left knee strength >/= 4+/5 so she can walk without assistive device   Time 8   Period Weeks   Status New   PT LONG TERM GOAL #5   Title knee extension </= -2 so she can stand to cook a meal without difficulty   Time 8   Period Weeks   Status New   Additional Long Term Goals   Additional Long Term Goals Yes   PT LONG TERM GOAL #6   Title left knee pain decreased >/= 75% during daily activities   Time 8   Period Weeks   Status New   PT LONG TERM GOAL #7   Title get up and down from a chair without hands due to increased left knee strength   Time 8   Period Weeks   Status New               Plan - 12/07/14 1013    Clinical Impression Statement Patient is a 74 year old female s/p left TKR on 11/27/2014.  Patient was in the hospital for  2 nights and had home health phsyical therapy for 2 weeks.  FOTO score is 56% limitation.  Patient reports pain is 2/10 contantly wiht increased with movement and sitting.  Circumfernec of right knee joint is 47 cm and left is 52.5 cm.  Left knee AROM  for flexion is 75 degrees and extension -16 degrees.  Left knee PROM is -8 degrees for extension and flexion 90 degrees.  left knee strength fleo flexion is 3-/5 and flexion 4-/5.  Decreased mobility of left patella. Increased bruising and edema of left knee.  Surgical site on left knee is draining bloody materal in one spot and patient keeps gauze on it. Patient ambulates with single point cane with decreased knee flexion and extension, decreased weightbear on left, and decreased dorsiflexion on left. Patient needs her hands to stand up.  Patient goes step to step with stairs using railing and cane. Patient benefit from physical therpay to increase left knee mobility, strength, and improve gait.    Pt will benefit from skilled therapeutic intervention in order  to improve on the following deficits Abnormal gait;Decreased activity tolerance;Difficulty walking;Increased edema;Impaired flexibility;Decreased strength;Hypomobility;Decreased range of motion;Decreased endurance;Decreased mobility;Decreased scar mobility;Increased muscle spasms;Pain;Increased fascial restricitons   Rehab Potential Excellent   Clinical Impairments Affecting Rehab Potential None   PT Frequency 2x / week   PT Duration 8 weeks   PT Treatment/Interventions ADLs/Self Care Home Management;Cryotherapy;Electrical Stimulation;Gait training;Ultrasound;Moist Heat;Stair training;Functional mobility training;Therapeutic activities;Therapeutic exercise;Neuromuscular re-education;Manual techniques;Patient/family education;Scar mobilization;Passive range of motion;Taping   PT Next Visit Plan soft tissue work, ROM of left knee, left knee strengthening   PT Home Exercise Plan Knee ROM exercises and  weight shifting   Recommended Other Services None   Consulted and Agree with Plan of Care Patient          G-Codes - 2015/01/05 1021    Functional Assessment Tool Used FOTO score is 56% limitation   Functional Limitation Mobility: Walking and moving around   Mobility: Walking and Moving Around Current Status (X6468) At least 40 percent but less than 60 percent impaired, limited or restricted   Mobility: Walking and Moving Around Goal Status (E3212) At least 40 percent but less than 60 percent impaired, limited or restricted     Discharge G-code is same as initial due to patient not returning.  It is [id number] 56% limitation CK. Earlie Counts, PT 02/06/2015 10:08 AM    Problem List Patient Active Problem List   Diagnosis Date Noted  . OA (osteoarthritis) of knee 11/26/2014    GRAY,CHERYL,PT 01-05-15, 10:24 AM  [facility] [address], [place], [place], [address] Phone: [phone]   Fax:  [phone]   PHYSICAL THERAPY DISCHARGE SUMMARY  Visits from Start of Care: 1  Current functional level related to goals / functional outcomes: See above.  Patient was discharged due to going to the hospital for a ruptured quad tendon.    Remaining deficits: See above.   Education / Equipment: None Plan: Patient agrees to discharge.  Patient goals were not met. Patient is being discharged due to a change in medical status.  Thank you for the referral. Earlie Counts, PT 02/06/2015 10:10 AM  ?????

## 2014-12-09 ENCOUNTER — Inpatient Hospital Stay (HOSPITAL_COMMUNITY)
Admission: EM | Admit: 2014-12-09 | Discharge: 2014-12-12 | DRG: 502 | Disposition: A | Discharge status: Home Health | Payer: Medicare Other | Attending: Orthopedic Surgery | Admitting: Orthopedic Surgery

## 2014-12-09 ENCOUNTER — Encounter (HOSPITAL_COMMUNITY): Payer: Self-pay | Admitting: Emergency Medicine

## 2014-12-09 ENCOUNTER — Emergency Department (HOSPITAL_COMMUNITY): Payer: Medicare Other

## 2014-12-09 ENCOUNTER — Inpatient Hospital Stay (HOSPITAL_COMMUNITY): Payer: Medicare Other

## 2014-12-09 DIAGNOSIS — S73192A Other sprain of left hip, initial encounter: Secondary | ICD-10-CM | POA: Diagnosis not present

## 2014-12-09 DIAGNOSIS — Z01810 Encounter for preprocedural cardiovascular examination: Secondary | ICD-10-CM | POA: Diagnosis not present

## 2014-12-09 DIAGNOSIS — G4733 Obstructive sleep apnea (adult) (pediatric): Secondary | ICD-10-CM | POA: Diagnosis present

## 2014-12-09 DIAGNOSIS — M549 Dorsalgia, unspecified: Secondary | ICD-10-CM | POA: Diagnosis not present

## 2014-12-09 DIAGNOSIS — M7989 Other specified soft tissue disorders: Secondary | ICD-10-CM | POA: Diagnosis not present

## 2014-12-09 DIAGNOSIS — M1990 Unspecified osteoarthritis, unspecified site: Secondary | ICD-10-CM | POA: Diagnosis not present

## 2014-12-09 DIAGNOSIS — N3281 Overactive bladder: Secondary | ICD-10-CM | POA: Diagnosis present

## 2014-12-09 DIAGNOSIS — X58XXXA Exposure to other specified factors, initial encounter: Secondary | ICD-10-CM | POA: Diagnosis present

## 2014-12-09 DIAGNOSIS — Z7901 Long term (current) use of anticoagulants: Secondary | ICD-10-CM | POA: Diagnosis not present

## 2014-12-09 DIAGNOSIS — Z79899 Other long term (current) drug therapy: Secondary | ICD-10-CM | POA: Diagnosis not present

## 2014-12-09 DIAGNOSIS — G629 Polyneuropathy, unspecified: Secondary | ICD-10-CM | POA: Diagnosis present

## 2014-12-09 DIAGNOSIS — Z79891 Long term (current) use of opiate analgesic: Secondary | ICD-10-CM

## 2014-12-09 DIAGNOSIS — H4020X0 Unspecified primary angle-closure glaucoma, stage unspecified: Secondary | ICD-10-CM | POA: Diagnosis present

## 2014-12-09 DIAGNOSIS — M66252 Spontaneous rupture of extensor tendons, left thigh: Secondary | ICD-10-CM | POA: Diagnosis not present

## 2014-12-09 DIAGNOSIS — S8002XA Contusion of left knee, initial encounter: Secondary | ICD-10-CM | POA: Diagnosis not present

## 2014-12-09 DIAGNOSIS — S76112A Strain of left quadriceps muscle, fascia and tendon, initial encounter: Secondary | ICD-10-CM

## 2014-12-09 DIAGNOSIS — Z6832 Body mass index (BMI) 32.0-32.9, adult: Secondary | ICD-10-CM | POA: Diagnosis not present

## 2014-12-09 DIAGNOSIS — G8929 Other chronic pain: Secondary | ICD-10-CM | POA: Diagnosis present

## 2014-12-09 DIAGNOSIS — Z90710 Acquired absence of both cervix and uterus: Secondary | ICD-10-CM | POA: Diagnosis not present

## 2014-12-09 DIAGNOSIS — Z471 Aftercare following joint replacement surgery: Secondary | ICD-10-CM | POA: Diagnosis not present

## 2014-12-09 DIAGNOSIS — Z01812 Encounter for preprocedural laboratory examination: Secondary | ICD-10-CM

## 2014-12-09 DIAGNOSIS — S76119A Strain of unspecified quadriceps muscle, fascia and tendon, initial encounter: Secondary | ICD-10-CM | POA: Diagnosis present

## 2014-12-09 DIAGNOSIS — Z01818 Encounter for other preprocedural examination: Secondary | ICD-10-CM | POA: Diagnosis not present

## 2014-12-09 DIAGNOSIS — S76122A Laceration of left quadriceps muscle, fascia and tendon, initial encounter: Secondary | ICD-10-CM | POA: Diagnosis present

## 2014-12-09 DIAGNOSIS — Z96652 Presence of left artificial knee joint: Secondary | ICD-10-CM | POA: Diagnosis present

## 2014-12-09 DIAGNOSIS — Z01811 Encounter for preprocedural respiratory examination: Secondary | ICD-10-CM

## 2014-12-09 DIAGNOSIS — M25562 Pain in left knee: Secondary | ICD-10-CM

## 2014-12-09 LAB — CBC WITH DIFFERENTIAL/PLATELET
Basophils Absolute: 0 10*3/uL (ref 0.0–0.1)
Basophils Relative: 0 % (ref 0–1)
EOS PCT: 2 % (ref 0–5)
Eosinophils Absolute: 0.1 10*3/uL (ref 0.0–0.7)
HCT: 34.6 % — ABNORMAL LOW (ref 36.0–46.0)
Hemoglobin: 11 g/dL — ABNORMAL LOW (ref 12.0–15.0)
LYMPHS ABS: 1.9 10*3/uL (ref 0.7–4.0)
Lymphocytes Relative: 28 % (ref 12–46)
MCH: 27.8 pg (ref 26.0–34.0)
MCHC: 31.8 g/dL (ref 30.0–36.0)
MCV: 87.4 fL (ref 78.0–100.0)
Monocytes Absolute: 0.5 10*3/uL (ref 0.1–1.0)
Monocytes Relative: 7 % (ref 3–12)
Neutro Abs: 4.2 10*3/uL (ref 1.7–7.7)
Neutrophils Relative %: 63 % (ref 43–77)
Platelets: 367 10*3/uL (ref 150–400)
RBC: 3.96 MIL/uL (ref 3.87–5.11)
RDW: 14.4 % (ref 11.5–15.5)
WBC: 6.7 10*3/uL (ref 4.0–10.5)

## 2014-12-09 LAB — URINALYSIS, ROUTINE W REFLEX MICROSCOPIC
Bilirubin Urine: NEGATIVE
Glucose, UA: NEGATIVE mg/dL
HGB URINE DIPSTICK: NEGATIVE
Ketones, ur: NEGATIVE mg/dL
Nitrite: POSITIVE — AB
PH: 7 (ref 5.0–8.0)
Protein, ur: NEGATIVE mg/dL
SPECIFIC GRAVITY, URINE: 1.009 (ref 1.005–1.030)
Urobilinogen, UA: 1 mg/dL (ref 0.0–1.0)

## 2014-12-09 LAB — BASIC METABOLIC PANEL
ANION GAP: 10 (ref 5–15)
BUN: 11 mg/dL (ref 6–20)
CO2: 24 mmol/L (ref 22–32)
Calcium: 9 mg/dL (ref 8.9–10.3)
Chloride: 106 mmol/L (ref 101–111)
Creatinine, Ser: 0.81 mg/dL (ref 0.44–1.00)
GFR calc Af Amer: >60 mL/min (ref >60)
GFR calc non Af Amer: >60 mL/min (ref >60)
Glucose, Bld: 97 mg/dL (ref 65–99)
Potassium: 4.2 mmol/L (ref 3.5–5.1)
SODIUM: 140 mmol/L (ref 135–145)

## 2014-12-09 LAB — PROTIME-INR
INR: 1.08 (ref 0.00–1.49)
PROTHROMBIN TIME: 14.2 s (ref 11.6–15.2)

## 2014-12-09 LAB — URINE MICROSCOPIC-ADD ON

## 2014-12-09 MED ORDER — ONDANSETRON HCL 4 MG/2ML IJ SOLN
4.0000 mg | Freq: Four times a day (QID) | INTRAMUSCULAR | Status: DC | PRN
  Administered 2014-12-10: 4 mg via INTRAVENOUS

## 2014-12-09 MED ORDER — ONDANSETRON HCL 4 MG PO TABS: 4.0000 mg | ORAL_TABLET | Freq: Four times a day (QID) | ORAL | Status: DC | PRN

## 2014-12-09 MED ORDER — DARIFENACIN HYDROBROMIDE ER 7.5 MG PO TB24
7.5000 mg | ORAL_TABLET | Freq: Every day | ORAL | Status: DC
  Administered 2014-12-09 – 2014-12-10 (×2): 7.5 mg via ORAL
  Filled 2014-12-09 (×3): qty 1

## 2014-12-09 MED ORDER — MORPHINE SULFATE 2 MG/ML IJ SOLN
2.0000 mg | INTRAMUSCULAR | Status: DC | PRN
  Filled 2014-12-09: qty 1

## 2014-12-09 MED ORDER — LACTATED RINGERS IV SOLN
INTRAVENOUS | Status: DC
  Administered 2014-12-09 – 2014-12-10 (×2): via INTRAVENOUS

## 2014-12-09 MED ORDER — OXYCODONE HCL 5 MG PO TABS
5.0000 mg | ORAL_TABLET | ORAL | Status: DC | PRN
  Administered 2014-12-10: 5 mg via ORAL
  Filled 2014-12-09: qty 1

## 2014-12-09 NOTE — ED Provider Notes (Signed)
CSN: [id number]     Arrival date & time 12/09/14  1349 History   First MD Initiated Contact with Patient 12/09/14 1506     Chief Complaint  Patient presents with  . Knee Pain     (Consider location/radiation/quality/duration/timing/severity/associated sxs/prior Treatment) HPI   Lauren English is a 74 y.o. female who is here for evaluation of a popping sensation in her left knee. This is associated with severe pain in her left knee. Both symptoms started this morning. She has had to resort to using crutches because she cannot bear weight on her left leg. Yesterday she was able to walk with her cane. She has been progressing well since her knee replacement surgery 2 weeks ago. She feels like the swelling in her knee and lower leg, have improved in the last several days. She thinks there is more redness in the left medial knee, then there had been yesterday. She denies fever, chills, cough, shortness of breath, chest pain, back pain, weakness or dizziness. There are no other known modifying factors.   Past Medical History  Diagnosis Date  . PONV (postoperative nausea and vomiting)   . Bladder incontinence   . OSA on CPAP   . Arthritis     "qwhere; feet, knees, neck" (04/21/2012)  . Chronic back pain   . Closed angle glaucoma     "both eyes" (04/21/2012)  . Prediabetes   . Urinary tract bacterial infections   . Overactive bladder   . Numbness     fingertips bilat and feet bilat   . Peripheral neuropathy     feet bilat    Past Surgical History  Procedure Laterality Date  . Back surgery    . Knee arthroplasty  06/2008    "slipped on ice; torn ligaments & cartilage" (04/21/2012)  . Posterior lumbar fusion  04/21/2012  . Tonsillectomy and adenoidectomy  ~ 1950  . Abdominal hysterectomy  1980    "partial" (04/21/2012)  . Bladder suspension  1990  . Anterior fusion cervical spine  12/2005  . Tubal ligation  1970's  . Dilation and curettage of uterus  1980  . Appendectomy    . 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 No pertinent family history. History  Substance Use Topics  . Smoking status: Never Smoker   . Smokeless tobacco: Never Used  . Alcohol Use: 0.6 oz/week    1 Glasses of wine per week     Comment: 04/21/2012 "glass of wine maybe once/wk"   OB History    No data available     Review of Systems  All other systems reviewed and are negative.     Allergies  Review of patient's allergies indicates no known allergies.  Home Medications   Prior to Admission medications   Medication Sig Start Date End Date Taking? Authorizing Provider  Alum & Mag Hydroxide-Simeth (MAGIC MOUTHWASH) SOLN Take 5 mLs by mouth 4 (four) times daily. For throat   Yes Historical Provider, MD  docusate sodium (COLACE) 100 MG capsule Take 100 mg by mouth 2 (two) times daily.   Yes Historical Provider, MD  methocarbamol (ROBAXIN) 500 MG tablet Take 1 tablet (500 mg total) by mouth every 6 (six) hours as needed for muscle spasms. 11/27/14  Yes Avel Peace, PA-C  OVER THE COUNTER MEDICATION Take 2 tablets by mouth every  evening.   Yes Historical Provider, MD  oxyCODONE (OXY IR/ROXICODONE) 5 MG immediate release tablet Take 1-2 tablets (5-10 mg total) by mouth every 3 (three) hours as needed for moderate pain or severe pain. 11/27/14  Yes Avel Peace, PA-C  rivaroxaban (XARELTO) 10 MG TABS tablet Take 1 tablet (10 mg total) by mouth daily with breakfast. Take Xarelto for two and a half more weeks, then discontinue Xarelto. Once the patient has completed the blood thinner regimen, then take a Baby 81 mg Aspirin daily for three more weeks. 11/27/14  Yes Avel Peace, PA-C  solifenacin (VESICARE) 10 MG tablet Take 10 mg by mouth daily.   Yes Historical Provider, MD  traMADol (ULTRAM) 50 MG tablet Take 1-2 tablets (50-100 mg total) by  mouth every 6 (six) hours as needed (mild pain). 11/27/14  Yes Avel Peace, PA-C   BP 133/64 mmHg  Pulse 77  Temp(Src) 98.7 F (37.1 C) (Oral)  Resp 18  SpO2 95% Physical Exam  Constitutional: She is oriented to person, place, and time. She appears well-developed.  Elderly, obese  HENT:  Head: Normocephalic and atraumatic.  Right Ear: External ear normal.  Left Ear: External ear normal.  Eyes: Conjunctivae and EOM are normal. Pupils are equal, round, and reactive to light.  Neck: Normal range of motion and phonation normal. Neck supple.  Cardiovascular: Normal rate, regular rhythm and normal heart sounds.   Pulmonary/Chest: Effort normal and breath sounds normal. She exhibits no bony tenderness.  Abdominal: Soft. There is no tenderness.  Musculoskeletal: Normal range of motion.  Left knee, tender and swollen with ecchymosis and erythema, and surgical closure devices in place. There is no drainage from the wound edges or palpable fluctuance. She has moderate tenderness to palpation of the anterior knee. There is no proximal streaking. There is an ill-defined redness of the mid proximal tibia region which is nonspecific in appearance. She is unable to extend the lower leg, indicating loss of extensor mechanism of the left knee. Neurovascular intact distally in the left foot. See attached image.  Neurological: She is alert and oriented to person, place, and time. No cranial nerve deficit or sensory deficit. She exhibits normal muscle tone. Coordination normal.  Skin: Skin is warm, dry and intact.  Psychiatric: She has a normal mood and affect. Her behavior is normal. Judgment and thought content normal.  Nursing note and vitals reviewed.   ED Course  Procedures (including critical care time)    17:24- Consult orthopedics-  Will see in ED   Medications  lactated ringers infusion (not administered)  oxyCODONE (Oxy IR/ROXICODONE) immediate release tablet 5 mg (not administered)    morphine 2 MG/ML injection 2 mg (not administered)  ondansetron (ZOFRAN) tablet 4 mg (not administered)    Or  ondansetron (ZOFRAN) injection 4 mg (not administered)  darifenacin (ENABLEX) 24 hr tablet 7.5 mg (not administered)    Patient Vitals for the past 24 hrs:  BP Temp Temp src Pulse Resp SpO2 Height Weight  12/09/14 2239 - - - 97 18 96 % - -  12/09/14 2148 122/66 mmHg 98.7 F (37.1 C) Oral (!) 112 20 96 % 5\' 10"  (1.778 m) 227 lb 4.7 oz (103.1 kg)  12/09/14 1848 135/60 mmHg - - 82 18 93 % - -  12/09/14 1639 133/64 mmHg - - 77 18 95 % - -  12/09/14 1353 149/60 mmHg 98.7 F (37.1 C) Oral 90 18 99 % - -    At time of transfer- Reevaluation with  update and discussion. After initial assessment and treatment, an updated evaluation reveals she is comfortable. Findings discussed with the patient. MRI was ordered but canceled at the request of the orthopedist. Omario Ander L     Labs Review Labs Reviewed  CBC WITH DIFFERENTIAL/PLATELET - Abnormal; Notable for the following:    Hemoglobin 11.0 (*)    HCT 34.6 (*)    All other components within normal limits  BASIC METABOLIC PANEL    Imaging Review Dg Knee Complete 4 Views Left  12/09/2014   CLINICAL DATA:  Two weeks postop from left knee arthroplasty. Worsening left knee pain, erythema, and swelling beginning today.  EXAM: LEFT KNEE - COMPLETE 4+ VIEW  COMPARISON:  None.  FINDINGS: Prominent anterior soft tissue swelling is seen. No evidence of soft tissue gas. No evidence of knee joint effusion.  Total knee arthroplasty is seen in appropriate position. No evidence of fracture or dislocation. No other significant bone abnormality identified.  IMPRESSION: Prominent anterior soft tissue swelling. No evidence of joint effusion or osseous abnormality.   Electronically Signed   By: Myles Rosenthal M.D.   On: 12/09/2014 15:18     EKG Interpretation None      MDM   Final diagnoses:  Knee pain, acute, left  Quadriceps tendon rupture,  left, initial encounter    Left knee injury following recent total knee replacement. Apparent quadricep tendon rupture on clinical exam. No apparent wound infection. No apparent fracture on imaging. She will require admission for stabilization procedure.  Nursing Notes Reviewed/ Care Coordinated, and agree without changes. Applicable Imaging Reviewed.  Interpretation of Laboratory Data incorporated into ED treatment  Plan: Admit    Mancel Bale, MD 12/09/14 2249

## 2014-12-09 NOTE — ED Notes (Signed)
Pt c/o left knee pain after hearing "popping" in knee while ambulating, states she had left total knee replacement 2 weeks ago, her MD instructed her to come to ED for x-ray due to injury. Knee is ecchymotic, pt states this occurred prior to today. Left knee is edematous, erythematous, hot, with red line traveling down from surgical incision midway down medial anterior lower leg.

## 2014-12-09 NOTE — ED Notes (Signed)
I attempted to collect labs twice and was unsuccessful.  I made nurse aware. 

## 2014-12-09 NOTE — ED Notes (Signed)
Bed: WA03 Expected date: 12/09/14 Expected time: 2:16 PM Means of arrival: Ambulance Comments: RM 3 Multiple wounds on legs and back

## 2014-12-09 NOTE — ED Notes (Signed)
Received call from Darlene regarding Extremity US.  Advised Darlene that Dr. Effie Shy is looking for a Quadricept Rupture.  Per Agustin Cree, they do not do musculoskeletal, patient would need an MRI.  Dr. Effie Shy made aware.  Information acknowledged.

## 2014-12-09 NOTE — H&P (Signed)
Emeterio Reeve, MD Chief Complaint: Acute left knee pain History: Patient 2 weeks s/p revision TKR with acute onset of knee pain and inability to walk.  No history of trauma.  Patient noted feeling and hearing a pop this AM while walking.  Came to ER for further evaluation.  Past Medical History  Diagnosis Date  . PONV (postoperative nausea and vomiting)   . Bladder incontinence   . OSA on CPAP   . Arthritis     "qwhere; feet, knees, neck" (04/21/2012)  . Chronic back pain   . Closed angle glaucoma     "both eyes" (04/21/2012)  . Prediabetes   . Urinary tract bacterial infections   . Overactive bladder   . Numbness     fingertips bilat and feet bilat   . Peripheral neuropathy     feet bilat     No Known Allergies  No current facility-administered medications on file prior to encounter.   Current Outpatient Prescriptions on File Prior to Encounter  Medication Sig Dispense Refill  . docusate sodium (COLACE) 100 MG capsule Take 100 mg by mouth 2 (two) times daily.    . methocarbamol (ROBAXIN) 500 MG tablet Take 1 tablet (500 mg total) by mouth every 6 (six) hours as needed for muscle spasms. 80 tablet 0  . OVER THE COUNTER MEDICATION Take 2 tablets by mouth every evening.    Marland Kitchen oxyCODONE (OXY IR/ROXICODONE) 5 MG immediate release tablet Take 1-2 tablets (5-10 mg total) by mouth every 3 (three) hours as needed for moderate pain or severe pain. 80 tablet 0  . rivaroxaban (XARELTO) 10 MG TABS tablet Take 1 tablet (10 mg total) by mouth daily with breakfast. Take Xarelto for two and a half more weeks, then discontinue Xarelto. Once the patient has completed the blood thinner regimen, then take a Baby 81 mg Aspirin daily for three more weeks. 19 tablet 0  . solifenacin (VESICARE) 10 MG tablet Take 10 mg by mouth daily.    . traMADol (ULTRAM) 50 MG tablet Take 1-2 tablets (50-100 mg total) by mouth every 6 (six) hours as needed (mild pain). 60 tablet 1    Physical Exam: A+O X3 NVI  - 2+ DP/PT pulses Compartments soft/nt EHL/TA/GA intact Sensation to light touch intact Erythema and swelling noted.  No drainage.  Wound intact Palpable defect in quad tendon Unable to actively extend knee, or st leg raise. No SOB/CP Abd soft/NT  Filed Vitals:   12/09/14 1848  BP: 135/60  Pulse: 82  Temp:   Resp: 18    Image: Dg Knee Complete 4 Views Left  12/09/2014   CLINICAL DATA:  Two weeks postop from left knee arthroplasty. Worsening left knee pain, erythema, and swelling beginning today.  EXAM: LEFT KNEE - COMPLETE 4+ VIEW  COMPARISON:  None.  FINDINGS: Prominent anterior soft tissue swelling is seen. No evidence of soft tissue gas. No evidence of knee joint effusion.  Total knee arthroplasty is seen in appropriate position. No evidence of fracture or dislocation. No other significant bone abnormality identified.  IMPRESSION: Prominent anterior soft tissue swelling. No evidence of joint effusion or osseous abnormality.   Electronically Signed   By: Myles Rosenthal M.D.   On: 12/09/2014 15:18    A/P:  Patient 2 weeks s/p revision left TKR.  Doing well until today.  Ambulating according to post-op protocal when she felt and heard a loud pop.  Unable to ambulate since due to pain.  Slight increase in swelling - denies  fevers/chills.  She does note that yesterday she felt dysuria. Clinical exam consistent with quad tendon rupture Plan on admission and possible surgery with Dr Despina Hick Will get either ultrasound or MRI this evening to confirm diagnosis

## 2014-12-10 ENCOUNTER — Inpatient Hospital Stay (HOSPITAL_COMMUNITY): Payer: Medicare Other | Admitting: Anesthesiology

## 2014-12-10 ENCOUNTER — Encounter (HOSPITAL_COMMUNITY)
Admission: EM | Disposition: A | Discharge status: Home Health | Payer: Self-pay | Source: Home / Self Care | Attending: Orthopedic Surgery

## 2014-12-10 ENCOUNTER — Encounter (HOSPITAL_COMMUNITY): Payer: Self-pay

## 2014-12-10 ENCOUNTER — Ambulatory Visit: Payer: Medicare Other | Admitting: Physical Therapy

## 2014-12-10 HISTORY — PX: PATELLAR TENDON REPAIR: SHX737

## 2014-12-10 LAB — SURGICAL PCR SCREEN
MRSA, PCR: NEGATIVE
Staphylococcus aureus: NEGATIVE

## 2014-12-10 SURGERY — REPAIR, TENDON, PATELLAR
Anesthesia: General | Site: Knee | Laterality: Left

## 2014-12-10 MED ORDER — ACETAMINOPHEN 650 MG RE SUPP: 650.0000 mg | Freq: Four times a day (QID) | RECTAL | Status: DC | PRN

## 2014-12-10 MED ORDER — PROPOFOL 10 MG/ML IV BOLUS
INTRAVENOUS | Status: AC
  Filled 2014-12-10: qty 20

## 2014-12-10 MED ORDER — ACETAMINOPHEN 325 MG PO TABS
650.0000 mg | ORAL_TABLET | Freq: Four times a day (QID) | ORAL | Status: DC | PRN
  Administered 2014-12-11 – 2014-12-12 (×2): 650 mg via ORAL
  Filled 2014-12-10 (×2): qty 2

## 2014-12-10 MED ORDER — HYDROMORPHONE HCL 1 MG/ML IJ SOLN
INTRAMUSCULAR | Status: AC
  Filled 2014-12-10: qty 1

## 2014-12-10 MED ORDER — METOCLOPRAMIDE HCL 10 MG PO TABS: 5.0000 mg | ORAL_TABLET | Freq: Three times a day (TID) | ORAL | Status: DC | PRN

## 2014-12-10 MED ORDER — PROPOFOL 10 MG/ML IV BOLUS
INTRAVENOUS | Status: DC | PRN
  Administered 2014-12-10: 150 mg via INTRAVENOUS

## 2014-12-10 MED ORDER — ACETAMINOPHEN 500 MG PO TABS
1000.0000 mg | ORAL_TABLET | Freq: Four times a day (QID) | ORAL | Status: AC
  Administered 2014-12-10 – 2014-12-11 (×4): 1000 mg via ORAL
  Filled 2014-12-10 (×5): qty 2

## 2014-12-10 MED ORDER — SODIUM CHLORIDE 0.9 % IV SOLN
INTRAVENOUS | Status: DC
  Administered 2014-12-11: 04:00:00 via INTRAVENOUS

## 2014-12-10 MED ORDER — LIDOCAINE HCL (CARDIAC) 20 MG/ML IV SOLN
INTRAVENOUS | Status: AC
  Filled 2014-12-10: qty 5

## 2014-12-10 MED ORDER — LACTATED RINGERS IV SOLN
INTRAVENOUS | Status: DC
  Administered 2014-12-10: 1000 mL via INTRAVENOUS

## 2014-12-10 MED ORDER — OXYCODONE HCL 5 MG PO TABS
5.0000 mg | ORAL_TABLET | ORAL | Status: DC | PRN
  Administered 2014-12-10: 5 mg via ORAL
  Administered 2014-12-11: 10 mg via ORAL
  Administered 2014-12-11: 5 mg via ORAL
  Administered 2014-12-11 – 2014-12-12 (×7): 10 mg via ORAL
  Filled 2014-12-10 (×3): qty 2
  Filled 2014-12-10: qty 1
  Filled 2014-12-10 (×5): qty 2
  Filled 2014-12-10: qty 1

## 2014-12-10 MED ORDER — HYDROMORPHONE HCL 1 MG/ML IJ SOLN
0.2500 mg | INTRAMUSCULAR | Status: DC | PRN
  Administered 2014-12-10 (×4): 0.5 mg via INTRAVENOUS

## 2014-12-10 MED ORDER — MAGIC MOUTHWASH
5.0000 mL | Freq: Four times a day (QID) | ORAL | Status: DC
Start: 2014-12-10 — End: 2014-12-12
  Administered 2014-12-10 – 2014-12-12 (×6): 5 mL via ORAL
  Filled 2014-12-10 (×10): qty 5

## 2014-12-10 MED ORDER — METHOCARBAMOL 500 MG PO TABS
500.0000 mg | ORAL_TABLET | Freq: Four times a day (QID) | ORAL | Status: DC | PRN
  Administered 2014-12-11 – 2014-12-12 (×4): 500 mg via ORAL
  Filled 2014-12-10 (×5): qty 1

## 2014-12-10 MED ORDER — RIVAROXABAN 10 MG PO TABS
10.0000 mg | ORAL_TABLET | Freq: Every day | ORAL | Status: DC
  Administered 2014-12-11 – 2014-12-12 (×2): 10 mg via ORAL
  Filled 2014-12-10 (×3): qty 1

## 2014-12-10 MED ORDER — FENTANYL CITRATE (PF) 100 MCG/2ML IJ SOLN
INTRAMUSCULAR | Status: AC
  Filled 2014-12-10: qty 4

## 2014-12-10 MED ORDER — DARIFENACIN HYDROBROMIDE ER 15 MG PO TB24
15.0000 mg | ORAL_TABLET | Freq: Every day | ORAL | Status: DC
  Administered 2014-12-11 – 2014-12-12 (×2): 15 mg via ORAL
  Filled 2014-12-10 (×3): qty 1

## 2014-12-10 MED ORDER — ONDANSETRON HCL 4 MG/2ML IJ SOLN
INTRAMUSCULAR | Status: AC
  Filled 2014-12-10: qty 2

## 2014-12-10 MED ORDER — DEXAMETHASONE SODIUM PHOSPHATE 10 MG/ML IJ SOLN
INTRAMUSCULAR | Status: AC
  Filled 2014-12-10: qty 1

## 2014-12-10 MED ORDER — LIDOCAINE HCL (CARDIAC) 20 MG/ML IV SOLN
INTRAVENOUS | Status: DC | PRN
  Administered 2014-12-10: 30 mg via INTRAVENOUS

## 2014-12-10 MED ORDER — DOCUSATE SODIUM 100 MG PO CAPS
100.0000 mg | ORAL_CAPSULE | Freq: Two times a day (BID) | ORAL | Status: DC
Start: 2014-12-10 — End: 2014-12-12
  Administered 2014-12-10 – 2014-12-12 (×4): 100 mg via ORAL

## 2014-12-10 MED ORDER — 0.9 % SODIUM CHLORIDE (POUR BTL) OPTIME
TOPICAL | Status: DC | PRN
  Administered 2014-12-10: 1000 mL

## 2014-12-10 MED ORDER — MIDAZOLAM HCL 2 MG/2ML IJ SOLN
INTRAMUSCULAR | Status: AC
  Filled 2014-12-10: qty 4

## 2014-12-10 MED ORDER — DEXAMETHASONE SODIUM PHOSPHATE 10 MG/ML IJ SOLN
INTRAMUSCULAR | Status: DC | PRN
  Administered 2014-12-10: 10 mg via INTRAVENOUS

## 2014-12-10 MED ORDER — FENTANYL CITRATE (PF) 100 MCG/2ML IJ SOLN
INTRAMUSCULAR | Status: DC | PRN
  Administered 2014-12-10 (×4): 50 ug via INTRAVENOUS

## 2014-12-10 MED ORDER — VANCOMYCIN HCL IN DEXTROSE 1-5 GM/200ML-% IV SOLN
1000.0000 mg | Freq: Two times a day (BID) | INTRAVENOUS | Status: AC
  Administered 2014-12-11: 1000 mg via INTRAVENOUS
  Filled 2014-12-10: qty 200

## 2014-12-10 MED ORDER — METOCLOPRAMIDE HCL 5 MG/ML IJ SOLN: 5.0000 mg | Freq: Three times a day (TID) | INTRAMUSCULAR | Status: DC | PRN

## 2014-12-10 MED ORDER — ONDANSETRON HCL 4 MG PO TABS: 4.0000 mg | ORAL_TABLET | Freq: Four times a day (QID) | ORAL | Status: DC | PRN

## 2014-12-10 MED ORDER — ONDANSETRON HCL 4 MG/2ML IJ SOLN: 4.0000 mg | Freq: Four times a day (QID) | INTRAMUSCULAR | Status: DC | PRN

## 2014-12-10 MED ORDER — VANCOMYCIN HCL 10 G IV SOLR
1500.0000 mg | INTRAVENOUS | Status: AC
  Administered 2014-12-10: 1500 mg via INTRAVENOUS
  Filled 2014-12-10 (×2): qty 1500

## 2014-12-10 SURGICAL SUPPLY — 47 items
BAG ZIPLOCK 12X15 (MISCELLANEOUS) ×2 IMPLANT
BANDAGE ELASTIC 6 VELCRO ST LF (GAUZE/BANDAGES/DRESSINGS) ×2 IMPLANT
BANDAGE ESMARK 6X9 LF (GAUZE/BANDAGES/DRESSINGS) ×1 IMPLANT
BIT DRILL 2.4X128 (BIT) ×2 IMPLANT
BNDG ESMARK 6X9 LF (GAUZE/BANDAGES/DRESSINGS) ×2
CLOSURE STERI-STRIP 1/4X4 (GAUZE/BANDAGES/DRESSINGS) ×2 IMPLANT
CUFF TOURN SGL QUICK 34 (TOURNIQUET CUFF) ×1
CUFF TRNQT CYL 34X4X40X1 (TOURNIQUET CUFF) ×1 IMPLANT
DRAPE INCISE IOBAN 66X45 STRL (DRAPES) IMPLANT
DRAPE POUCH INSTRU U-SHP 10X18 (DRAPES) ×2 IMPLANT
DRAPE U-SHAPE 47X51 STRL (DRAPES) ×2 IMPLANT
DRSG ADAPTIC 3X8 NADH LF (GAUZE/BANDAGES/DRESSINGS) ×2 IMPLANT
DRSG PAD ABDOMINAL 8X10 ST (GAUZE/BANDAGES/DRESSINGS) ×2 IMPLANT
DURAPREP 26ML APPLICATOR (WOUND CARE) ×2 IMPLANT
ELECT REM PT RETURN 9FT ADLT (ELECTROSURGICAL) ×2
ELECTRODE REM PT RTRN 9FT ADLT (ELECTROSURGICAL) ×1 IMPLANT
GAUZE SPONGE 4X4 12PLY STRL (GAUZE/BANDAGES/DRESSINGS) ×2 IMPLANT
GLOVE BIO SURGEON STRL SZ7.5 (GLOVE) ×2 IMPLANT
GLOVE BIO SURGEON STRL SZ8 (GLOVE) ×2 IMPLANT
GLOVE BIOGEL PI IND STRL 8 (GLOVE) ×2 IMPLANT
GLOVE BIOGEL PI INDICATOR 8 (GLOVE) ×2
GOWN STRL REUS W/TWL LRG LVL3 (GOWN DISPOSABLE) ×2 IMPLANT
GOWN STRL REUS W/TWL XL LVL3 (GOWN DISPOSABLE) ×2 IMPLANT
IMMOBILIZER KNEE 20 (SOFTGOODS) ×4 IMPLANT
IMMOBILIZER KNEE 20 THIGH 36 (SOFTGOODS) ×1 IMPLANT
LIQUID BAND (GAUZE/BANDAGES/DRESSINGS) ×2 IMPLANT
MANIFOLD NEPTUNE II (INSTRUMENTS) ×2 IMPLANT
NEEDLE MA TROC 1/2 (NEEDLE) ×2 IMPLANT
NEEDLE MAYO .5 CIRCLE (NEEDLE) IMPLANT
PACK TOTAL JOINT (CUSTOM PROCEDURE TRAY) ×2 IMPLANT
PAD ABD 8X10 STRL (GAUZE/BANDAGES/DRESSINGS) ×2 IMPLANT
PADDING CAST ABS 6INX4YD NS (CAST SUPPLIES) ×2
PADDING CAST ABS COTTON 6X4 NS (CAST SUPPLIES) ×2 IMPLANT
PADDING CAST COTTON 6X4 STRL (CAST SUPPLIES) ×4 IMPLANT
PASSER SUT SWANSON 36MM LOOP (INSTRUMENTS) ×2 IMPLANT
POSITIONER SURGICAL ARM (MISCELLANEOUS) ×2 IMPLANT
STRIP CLOSURE SKIN 1/2X4 (GAUZE/BANDAGES/DRESSINGS) ×4 IMPLANT
SUT ETHIBOND NAB CT1 #1 30IN (SUTURE) ×4 IMPLANT
SUT FIBERWIRE #2 38 T-5 BLUE (SUTURE) ×4
SUT MNCRL AB 4-0 PS2 18 (SUTURE) ×2 IMPLANT
SUT VIC AB 1 CT1 27 (SUTURE)
SUT VIC AB 1 CT1 27XBRD ANTBC (SUTURE) IMPLANT
SUT VIC AB 2-0 CT1 27 (SUTURE) ×2
SUT VIC AB 2-0 CT1 TAPERPNT 27 (SUTURE) ×2 IMPLANT
SUTURE FIBERWR #2 38 T-5 BLUE (SUTURE) ×2 IMPLANT
TOWEL OR 17X26 10 PK STRL BLUE (TOWEL DISPOSABLE) ×2 IMPLANT
WRAP KNEE MAXI GEL POST OP (GAUZE/BANDAGES/DRESSINGS) ×2 IMPLANT

## 2014-12-10 NOTE — Interval H&P Note (Signed)
History and Physical Interval Note:  12/10/2014 3:13 PM  Lauren English  has presented today for surgery, with the diagnosis of patella tendon tear left  The various methods of treatment have been discussed with the patient and family. After consideration of risks, benefits and other options for treatment, the patient has consented to  Procedure(s): PATELLA TENDON REPAIR (Left) as a surgical intervention .  The patient's history has been reviewed, patient examined, no change in status, stable for surgery.  I have reviewed the patient's chart and labs.  Questions were answered to the patient's satisfaction.     Loanne Drilling

## 2014-12-10 NOTE — Progress Notes (Signed)
RT placed pt on CPAP set at [facility] per home settings with 2Lpm of oxygen bled in via home nasal mask. Pt states she is comfortable and is tolerating CPAP well at this time. RT will continue to monitor as needed.

## 2014-12-10 NOTE — Care Management Note (Signed)
Case Management Note  Patient Details  Name: DOYLE TEGETHOFF MRN: [id number] Date of Birth: 25-Feb-1941  Subjective/Objective:                Dislocation of recent total knee replacement and tear of tendon    Action/Plan: Date:  December 10, 2014 U.R. performed for needs and level of care. Will continue to follow for Case Management needs.  Marcelle Smiling, RN, BSN, [place]   [phone]  Expected Discharge Date:                  Expected Discharge Plan:  Home w Home Health Services  In-House Referral:  NA  Discharge planning Services  CM Consult  Post Acute Care Choice:  Home Health Choice offered to:     DME Arranged:  N/A DME Agency:  NA  HH Arranged:  NA HH Agency:  NA  Status of Service:  In process, will continue to follow  Medicare Important Message Given:    Date Medicare IM Given:    Medicare IM give by:    Date Additional Medicare IM Given:    Additional Medicare Important Message give by:     If discussed at Long Length of Stay Meetings, dates discussed:    Additional Comments:  Golda Acre, RN 12/10/2014, 11:23 AM

## 2014-12-10 NOTE — Anesthesia Procedure Notes (Signed)
Procedure Name: LMA Insertion Date/Time: 12/10/2014 5:23 PM Performed by: Early Osmond E Pre-anesthesia Checklist: Patient identified, Emergency Drugs available, Suction available and Patient being monitored Patient Re-evaluated:Patient Re-evaluated prior to inductionOxygen Delivery Method: Circle system utilized Preoxygenation: Pre-oxygenation with 100% oxygen Intubation Type: IV induction Ventilation: Mask ventilation without difficulty LMA: LMA inserted LMA Size: 3.0 Number of attempts: 1 Dental Injury: Teeth and Oropharynx as per pre-operative assessment

## 2014-12-10 NOTE — Progress Notes (Signed)
Subjective: Patient complains of left knee pain and inability to extend knee actively. Was doing fine and had a painful pop yesterday while ambulating   Objective: Vital signs in last 24 hours: Temp:  [98 F (36.7 C)-98.7 F (37.1 C)] 98 F (36.7 C) (08/01 0454) Pulse Rate:  [77-112] 78 (08/01 0454) Resp:  [18-20] 18 (08/01 0454) BP: (122-149)/(57-66) 139/57 mmHg (08/01 0454) SpO2:  [93 %-99 %] 97 % (08/01 0454) Weight:  [103.1 kg (227 lb 4.7 oz)] 103.1 kg (227 lb 4.7 oz) (07/31 2148)  Intake/Output from previous day: 07/31 0701 - 08/01 0700 In: -  Out: 700 [Urine:700] Intake/Output this shift:     Recent Labs  12/09/14 1637  HGB 11.0*    Recent Labs  12/09/14 1637  WBC 6.7  RBC 3.96  HCT 34.6*  PLT 367    Recent Labs  12/09/14 1637  NA 140  K 4.2  CL 106  CO2 24  BUN 11  CREATININE 0.81  GLUCOSE 97  CALCIUM 9.0    Recent Labs  12/09/14 1950  INR 1.08    Left knee with effusion. Tender tibial tubercle. Unable to extend knee actively or do straight leg raise  Assessment/Plan: Left knee patella tendon rupture- plan patella tendon repair today. Discussed in detail with patient who elects to proceed.   Loanne Drilling 12/10/2014, 7:01 AM

## 2014-12-10 NOTE — Brief Op Note (Signed)
12/09/2014 - 12/10/2014  6:02 PM  PATIENT:  [name]  74 y.o. female  PRE-OPERATIVE DIAGNOSIS:  patella tendon tear left  POST-OPERATIVE DIAGNOSIS:  PARTIAL QUAD TENDON TEAR  PROCEDURE:  Procedure(s): REPAIR PARTIAL QUAD TENDON TEAR (Left)  SURGEON:  Surgeon(s) and Role:    * Ollen Gross, MD - Primary  PHYSICIAN ASSISTANT:   ASSISTANTS: Avel Peace, PA-C   ANESTHESIA:   general  EBL:  Total I/O In: -  Out: 1550 [Urine:1550]  BLOOD ADMINISTERED:none  DRAINS: (Medium) Hemovact drain(s) in the left knee with  Suction Open   LOCAL MEDICATIONS USED:  NONE  COUNTS:  YES  TOURNIQUET:   Total Tourniquet Time Documented: Thigh (Left) - 14 minutes Total: Thigh (Left) - 14 minutes   DICTATION: .Other Dictation: Dictation Number [id number]  PLAN OF CARE: Admit to inpatient   PATIENT DISPOSITION:  PACU - hemodynamically stable.

## 2014-12-10 NOTE — Transfer of Care (Signed)
Immediate Anesthesia Transfer of Care Note  Patient: Lauren English  Procedure(s) Performed: Procedure(s): REPAIR PARTIAL QUAD TENDON TEAR (Left)  Patient Location: PACU  Anesthesia Type:General  Level of Consciousness:  sedated, patient cooperative and responds to stimulation  Airway & Oxygen Therapy:Patient Spontanous Breathing and Patient connected to face mask oxgen  Post-op Assessment:  Report given to PACU RN and Post -op Vital signs reviewed and stable  Post vital signs:  Reviewed and stable  Last Vitals:  Filed Vitals:   12/10/14 0454  BP: 139/57  Pulse: 78  Temp: 36.7 C  Resp: 18    Complications: No apparent anesthesia complications

## 2014-12-10 NOTE — Anesthesia Postprocedure Evaluation (Signed)
  Anesthesia Post-op Note  Patient: Lauren English  Procedure(s) Performed: Procedure(s): REPAIR PARTIAL QUAD TENDON TEAR (Left)  Patient Location: PACU  Anesthesia Type:General  Level of Consciousness: awake and alert   Airway and Oxygen Therapy: Patient Spontanous Breathing  Post-op Pain: Controlled  Post-op Assessment: Post-op Vital signs reviewed, Patient's Cardiovascular Status Stable and Respiratory Function Stable  Post-op Vital Signs: Reviewed  Filed Vitals:   12/10/14 1945  BP: 147/63  Pulse: 82  Temp: 36.8 C  Resp: 16    Complications: No apparent anesthesia complications

## 2014-12-10 NOTE — Anesthesia Preprocedure Evaluation (Signed)
Anesthesia Evaluation  Patient identified by MRN, date of birth, ID band Patient awake    Reviewed: Allergy & Precautions, H&P , NPO status , Patient's Chart, lab work & pertinent test results  Airway Mallampati: III  TM Distance: >3 FB Neck ROM: Full  Mouth opening: Limited Mouth Opening  Dental no notable dental hx. (+) Teeth Intact, Dental Advisory Given   Pulmonary sleep apnea and Continuous Positive Airway Pressure Ventilation ,  breath sounds clear to auscultation  Pulmonary exam normal       Cardiovascular negative cardio ROS  Rhythm:Regular Rate:Normal     Neuro/Psych negative neurological ROS  negative psych ROS   GI/Hepatic negative GI ROS, Neg liver ROS,   Endo/Other  negative endocrine ROS  Renal/GU negative Renal ROS  negative genitourinary   Musculoskeletal  (+) Arthritis -, Osteoarthritis,    Abdominal   Peds  Hematology negative hematology ROS (+)   Anesthesia Other Findings   Reproductive/Obstetrics negative OB ROS                             Anesthesia Physical Anesthesia Plan  ASA: III  Anesthesia Plan: General   Post-op Pain Management:    Induction: Intravenous  Airway Management Planned: LMA  Additional Equipment:   Intra-op Plan:   Post-operative Plan: Extubation in OR  Informed Consent: I have reviewed the patients History and Physical, chart, labs and discussed the procedure including the risks, benefits and alternatives for the proposed anesthesia with the patient or authorized representative who has indicated his/her understanding and acceptance.   Dental advisory given  Plan Discussed with: CRNA  Anesthesia Plan Comments:         Anesthesia Quick Evaluation

## 2014-12-11 ENCOUNTER — Encounter (HOSPITAL_COMMUNITY): Payer: Self-pay | Admitting: Orthopedic Surgery

## 2014-12-11 NOTE — Evaluation (Signed)
Physical Therapy Evaluation Patient Details Name: Lauren English MRN: [id number] DOB: 06-Aug-1940 Today's Date: 12/11/2014   History of Present Illness  Pt is a 74 year old female s/p L TKA one week ago, onset of L knee weakness on 12/09/14., partial tear of quad  tendon. S/P repair 12/10/14  Clinical Impression  Patient tolerated very well. Patient will benefit from PT to address problems listed in note below to DC to home.    Follow Up Recommendations Home health PT    Equipment Recommendations  None recommended by PT    Recommendations for Other Services       Precautions / Restrictions Precautions Precautions: Knee Precaution Comments: per Kenard Gower, PA- limit L knee flexion to 90 degrees., gentle Ther ex Required Braces or Orthoses: Knee Immobilizer - Left Knee Immobilizer - Left: On when out of bed or walking      Mobility  Bed Mobility Overal bed mobility: Needs Assistance Bed Mobility: Supine to Sit     Supine to sit: Min guard        Transfers Overall transfer level: Needs assistance Equipment used: Rolling walker (2 wheeled) Transfers: Sit to/from Stand Sit to Stand: Min assist         General transfer comment: good technique  Ambulation/Gait Ambulation/Gait assistance: Min assist Ambulation Distance (Feet): 200 Feet Assistive device: Rolling walker (2 wheeled) Gait Pattern/deviations: Step-through pattern;Antalgic     General Gait Details: verbal cues for step length, RW positioning, posture  Stairs            Wheelchair Mobility    Modified Rankin (Stroke Patients Only)       Balance                                             Pertinent Vitals/Pain Pain Score: 3  Pain Location: L knee Pain Descriptors / Indicators: Burning Pain Intervention(s): Monitored during session;Premedicated before session;Ice applied    Home Living Family/patient expects to be discharged to:: Private residence Living Arrangements:  Spouse/significant other;Children Available Help at Discharge: Family Type of Home: House Home Access: Stairs to enter Entrance Stairs-Rails: None Entrance Stairs-Number of Steps: 3 Home Layout: Able to live on main level with bedroom/bathroom Home Equipment: Toilet riser;Shower seat;Bedside commode;Walker - 2 wheels;Crutches Additional Comments: spouse has parkinson's, daughter coming in  tonight.    Prior Function Level of Independence: Independent with assistive device(s)               Hand Dominance        Extremity/Trunk Assessment               Lower Extremity Assessment: LLE deficits/detail   LLE Deficits / Details: able to perform SLR, approx 50* of AAROM L knee flexion observed during heel slides     Communication   Communication: No difficulties  Cognition Arousal/Alertness: Awake/alert Behavior During Therapy: WFL for tasks assessed/performed Overall Cognitive Status: Within Functional Limits for tasks assessed                      General Comments      Exercises Total Joint Exercises Ankle Circles/Pumps: AROM;Both;15 reps Quad Sets: AROM;Both;15 reps Short Arc Quad: AROM;Left;10 reps Heel Slides: AAROM;Left;10 reps Hip ABduction/ADduction: AROM;Left;10 reps Straight Leg Raises: AROM;Left;10 reps Goniometric ROM: limited to 50 degrees of L knee flexion      Assessment/Plan  PT Assessment Patient needs continued PT services  PT Diagnosis Difficulty walking;Acute pain   PT Problem List Decreased strength;Decreased range of motion;Decreased mobility;Decreased knowledge of use of DME;Pain  PT Treatment Interventions Functional mobility training;Therapeutic activities;Therapeutic exercise;Gait training;DME instruction;Stair training;Patient/family education   PT Goals (Current goals can be found in the Care Plan section) Acute Rehab PT Goals Patient Stated Goal: to go home and get this better. PT Goal Formulation: With patient Time  For Goal Achievement: 12/14/14 Potential to Achieve Goals: Good    Frequency 7X/week   Barriers to discharge        Co-evaluation               End of Session Equipment Utilized During Treatment: Left knee immobilizer Activity Tolerance: Patient tolerated treatment well Patient left: in chair;with call bell/phone within reach Nurse Communication: Mobility status         Time: 1308-6578 PT Time Calculation (min) (ACUTE ONLY): 30 min   Charges:   PT Evaluation $Initial PT Evaluation Tier I: 1 Procedure PT Treatments $Gait Training: 8-22 mins   PT G Codes:        Rada Hay 12/11/2014, 10:35 AM Blanchard Kelch PT [phone]

## 2014-12-11 NOTE — Care Management Important Message (Signed)
Important Message  Patient Details  Name: HERBERTA PICKRON MRN: [id number] Date of Birth: Aug 04, 1940   Medicare Important Message Given:  [facility] notification given    Haskell Flirt 12/11/2014, 2:41 PMImportant Message  Patient Details  Name: SALLEE HOGREFE MRN: [id number] Date of Birth: 1940-12-24   Medicare Important Message Given:  Yes-second notification given    Haskell Flirt 12/11/2014, 2:41 PM

## 2014-12-11 NOTE — Op Note (Signed)
NAMEALDINE, CHAKRABORTY                ACCOUNT NO.:  [id number]  MEDICAL RECORD NO.:  [id number]  LOCATION:  1617                         FACILITY:  [facility]  PHYSICIAN:  Ollen Gross, M.D.    DATE OF BIRTH:  04/06/1941  DATE OF PROCEDURE:  12/10/2014 DATE OF DISCHARGE:                              OPERATIVE REPORT   PREOPERATIVE DIAGNOSIS:  Suspected left patella tendon tear.  POSTOPERATIVE DIAGNOSIS:  Left quadriceps partial tendon tear.  PROCEDURE:  Repair of partial quadriceps tendon repair, left knee.  SURGEON:  Ollen Gross, M.D.  ASSISTANT:  Alexzandrew L. Perkins, PA-C.  ANESTHESIA:  General.  ESTIMATED BLOOD LOSS:  Minimal.  DRAINS:  Hemovac x1.  TOURNIQUET TIME:  14 minutes at 300 mmHg.  COMPLICATIONS:  None.  CONDITION:  Stable to recovery.  BRIEF CLINICAL NOTE:  Ms. Loge is a 74 year old female, who underwent left total knee arthroplasty 2 weeks ago.  She was doing extremely well and yesterday was walking and felt a pop in the knee.  She was unable to extend the knee after that.  It was felt that she had possible patellar tendon versus quadriceps tendon rupture.  She did not have any abnormalities in her knee x-ray.  Given that she was unable to do a straight leg raise and unable to bear weight, it was decided to take her to surgery today for extensor mechanism repair.  PROCEDURE IN DETAIL:  After successful administration of general anesthetic, a tourniquet was placed on the left thigh and left lower extremity prepped and draped in usual sterile fashion.  Extremities wrapped in Esmarch, and tourniquet inflated to 300 mmHg.  A midline incision was made with a 10 blade and a large subcutaneous hematoma was evacuated.  She had a large lateral release from her initial surgery and that did not tear any further.  Fortunately, she just had a partial tear in the quadriceps tendon superior to the patella.  It was partial thickness, partial tear.  I thoroughly  irrigated the joint and subcu tissues with a liter of saline.  I then fixed the tear with interrupted #1 Ethibond suture.  Placed the knee through a range of motion, I was easily able flex her to 120 degrees without any excess tension on the repair.  I then further irrigated and closed the subcu tissue over Hemovac drain with interrupted 2-0 Vicryl.  We let the tourniquet down for a total time of 14 minutes.  Subcuticular was closed with running 4-0 Monocryl.  The incision was cleaned and dried and Dermabond placed.  A bulky sterile dressing was applied and she was awakened and transported to recovery room in stable condition.     Ollen Gross, M.D.     FA/MEDQ  D:  12/10/2014  T:  12/11/2014  Job:  [id number]

## 2014-12-11 NOTE — Care Management Note (Signed)
Case Management Note  Patient Details  Name: Lauren English MRN: [id number] Date of Birth: June 30, 1940  Subjective/Objective:                    Action/Plan:   Expected Discharge Date:                  Expected Discharge Plan:  [place]  In-House Referral:  NA  Discharge planning Services  CM Consult  Post Acute Care Choice:  Home Health Choice offered to:  Patient  DME Arranged:  N/A DME Agency:  NA  HH Arranged:  PT HH Agency:  [facility]  Status of Service:  Completed, signed off  Medicare Important Message Given:    Date Medicare IM Given:    Medicare IM give by:    Date Additional Medicare IM Given:    Additional Medicare Important Message give by:     If discussed at [place] of Stay Meetings, dates discussed:    Additional Comments: CM met with pt in room to offer choice of home health agency.  Pt chooses [facility] to render HHPT.  Address and contact information verified by pt.  Referral emailed to [employer], Tim.  Pt has 3n1 and rolling walker and cane from previous surgery.  No other CM needs were communicated. Dellie Catholic, RN 12/11/2014, 12:11 PM

## 2014-12-11 NOTE — Progress Notes (Signed)
   Subjective: 1 Day Post-Op Procedure(s) (LRB): REPAIR PARTIAL QUAD TENDON TEAR (Left) Patient reports pain as mild.   Patient seen in rounds with Dr. Lequita Halt. Briefly discussed surgical findings. Patient is well, but has had some minor complaints of pain in the knee, requiring pain medications We will start therapy today.  Plan is to go Home after hospital stay.  Objective: Vital signs in last 24 hours: Temp:  [97.9 F (36.6 C)-98.2 F (36.8 C)] 98 F (36.7 C) (08/02 0534) Pulse Rate:  [71-88] 71 (08/02 0534) Resp:  [7-16] 16 (08/02 0534) BP: (135-161)/(53-66) 138/53 mmHg (08/02 0534) SpO2:  [94 %-100 %] 95 % (08/02 0534)  Intake/Output from previous day: 08/01 0701 - 08/02 0700 In: 2731.7 [P.O.:1340; I.V.:1391.7] Out: 3680 [Urine:3600; Drains:80] Intake/Output this shift: Total I/O In: 360 [P.O.:360] Out: -    Recent Labs  12/09/14 1637  HGB 11.0*    Recent Labs  12/09/14 1637  WBC 6.7  RBC 3.96  HCT 34.6*  PLT 367    Recent Labs  12/09/14 1637  NA 140  K 4.2  CL 106  CO2 24  BUN 11  CREATININE 0.81  GLUCOSE 97  CALCIUM 9.0    Recent Labs  12/09/14 1950  INR 1.08    EXAM General - Patient is Alert and Appropriate Extremity - Neurovascular intact Sensation intact distally Dressing - dressing C/D/I Motor Function - intact, moving foot and toes well on exam.  Left drain in place.  Past Medical History  Diagnosis Date  . PONV (postoperative nausea and vomiting)   . Bladder incontinence   . OSA on CPAP   . Arthritis     "qwhere; feet, knees, neck" (04/21/2012)  . Chronic back pain   . Closed angle glaucoma     "both eyes" (04/21/2012)  . Prediabetes   . Urinary tract bacterial infections   . Overactive bladder   . Numbness     fingertips bilat and feet bilat   . Peripheral neuropathy     feet bilat     Assessment/Plan: 1 Day Post-Op Procedure(s) (LRB): REPAIR PARTIAL QUAD TENDON TEAR (Left) Active Problems:   Quadriceps  tendon rupture  Estimated body mass index is 32.61 kg/(m^2) as calculated from the following:   Height as of this encounter:  (1.778 m).   Weight as of this encounter: 103.1 kg (227 lb 4.7 oz). Advance diet Up with therapy Plan for discharge tomorrow Discharge home with home health  DVT Prophylaxis - Xarelto Weight-Bearing as tolerated to left leg D/C O2 and Pulse OX and try on Room Air Do not bend beyond 90 degrees of flexion for the first two weeks of therapy.  Avel Peace, PA-C Orthopaedic Surgery 12/11/2014, 9:21 AM

## 2014-12-11 NOTE — Progress Notes (Signed)
Physical Therapy Treatment Patient Details Name: Lauren English MRN: [id number] DOB: December 22, 1940 Today's Date: 12/11/2014    History of Present Illness Pt is a 74 year old female s/p L TKA one week ago, onset of L knee weakness on 12/09/14., partial tear of quad  tendon. S/P repair 12/10/14    PT Comments    Patient reports increase in pain, meds due soon. Plans  For DC tomorrow.  Follow Up Recommendations  Home health PT     Equipment Recommendations  None recommended by PT    Recommendations for Other Services       Precautions / Restrictions Precautions Precautions: Knee Precaution Comments: per Kenard Gower, PA- limit L knee flexion to 90 degrees., gentle Ther ex Required Braces or Orthoses: Knee Immobilizer - Left Knee Immobilizer - Left: On when out of bed or walking    Mobility  Bed Mobility Overal bed mobility: Needs Assistance Bed Mobility: Supine to Sit;Sit to Supine     Supine to sit: Min guard Sit to supine: Min assist   General bed mobility comments: support  L leg onto bed  Transfers Overall transfer level: Needs assistance Equipment used: Rolling walker (2 wheeled) Transfers: Sit to/from Stand Sit to Stand: Min assist         General transfer comment: good technique  Ambulation/Gait Ambulation/Gait assistance: Min assist Ambulation Distance (Feet): 100 Feet Assistive device: Rolling walker (2 wheeled) Gait Pattern/deviations: Step-to pattern;Antalgic     General Gait Details: verbal cues for step length, RW positioning, posture   Stairs            Wheelchair Mobility    Modified Rankin (Stroke Patients Only)       Balance                                    Cognition Arousal/Alertness: Awake/alert Behavior During Therapy: WFL for tasks assessed/performed Overall Cognitive Status: Within Functional Limits for tasks assessed                      Exercises Total Joint Exercises Ankle Circles/Pumps:  AROM;Both;15 reps Quad Sets: AROM;Both;15 reps Short Arc Quad: AROM;Left;10 reps Heel Slides: AAROM;Left;10 reps Hip ABduction/ADduction: AROM;Left;10 reps Straight Leg Raises: AROM;Left;10 reps Goniometric ROM: limited to 50 degrees of L knee flexion    General Comments        Pertinent Vitals/Pain Pain Assessment: 0-10 Pain Score: 6  Pain Location: L knee Pain Descriptors / Indicators: Aching;Tightness Pain Intervention(s): Monitored during session;Patient requesting pain meds-RN notified;Ice applied    Home Living                 Additional Comments: spouse has parkinson's, daughter coming in  tonight.    Prior Function            PT Goals (current goals can now be found in the care plan section) Acute Rehab PT Goals Patient Stated Goal: to go home and get this better. PT Goal Formulation: With patient Time For Goal Achievement: 12/14/14 Potential to Achieve Goals: Good Progress towards PT goals: Progressing toward goals    Frequency  7X/week    PT Plan Current plan remains appropriate    Co-evaluation             End of Session Equipment Utilized During Treatment: Left knee immobilizer Activity Tolerance: Patient tolerated treatment well Patient left: in bed;with call bell/phone within reach  Time: 1610-9604 PT Time Calculation (min) (ACUTE ONLY): 23 min  Charges:  $Gait Training: 8-22 mins $Self Care/Home Management: 8-22                    G Codes:      Rada Hay 12/11/2014, 2:29 PM Blanchard Kelch PT [phone]

## 2014-12-11 NOTE — Progress Notes (Signed)
Pt states she does not require any assistance with CPAP or mask application and will place herself on after she receives her qhs meds. Pt is using her home nasal mask and tubing. CPAP is set at [facility] per home settings on room air. RT will continue to monitor as needed.

## 2014-12-12 ENCOUNTER — Encounter: Payer: Medicare Other | Admitting: Physical Therapy

## 2014-12-12 MED ORDER — METHOCARBAMOL 500 MG PO TABS: 500.0000 mg | ORAL_TABLET | Freq: Four times a day (QID) | ORAL | Status: DC | PRN

## 2014-12-12 MED ORDER — TRAMADOL HCL 50 MG PO TABS: 50.0000 mg | ORAL_TABLET | Freq: Four times a day (QID) | ORAL | Status: DC | PRN

## 2014-12-12 MED ORDER — OXYCODONE HCL 5 MG PO TABS: 5.0000 mg | ORAL_TABLET | ORAL | Status: DC | PRN

## 2014-12-12 MED ORDER — RIVAROXABAN 10 MG PO TABS: 10.0000 mg | ORAL_TABLET | Freq: Every day | ORAL | Status: DC

## 2014-12-12 NOTE — Discharge Instructions (Addendum)
No Home Health at home. Will resume her outpatient therapy in one week.  Pick up stool softner and laxative for home use following surgery while on pain medications. Do not submerge incision under water. Please use good hand washing techniques while changing dressing each day. May shower starting three days after surgery. Please use a clean towel to pat the incision dry following showers. Continue to use ice for pain and swelling after surgery. Do not use any lotions or creams on the incision until instructed by your surgeon.   Take the remaining Xarelto at home and then switch to a baby 81 mg Aspirin for an additional three weeks.  Postoperative Constipation Protocol  Constipation - defined medically as fewer than three stools per week and severe constipation as less than one stool per week.  One of the most common issues patients have following surgery is constipation. Even if you have a regular bowel pattern at home, your normal regimen is likely to be disrupted due to multiple reasons following surgery. Combination of anesthesia, postoperative narcotics, change in appetite and fluid intake all can affect your bowels. In order to avoid complications following surgery, here are some recommendations in order to help you during your recovery period.  Colace (docusate) - Pick up an over-the-counter form of Colace or another stool softener and take twice a day as long as you are requiring postoperative pain medications. Take with a full glass of water daily. If you experience loose stools or diarrhea, hold the colace until you stool forms back up. If your symptoms do not get better within 1 week or if they get worse, check with your doctor.  Dulcolax (bisacodyl) - Pick up over-the-counter and take as directed by the product packaging as needed to assist with the movement of your bowels. Take with a full glass of water. Use this product as needed if not relieved by Colace only.   MiraLax  (polyethylene glycol) - Pick up over-the-counter to have on hand. MiraLax is a solution that will increase the amount of water in your bowels to assist with bowel movements. Take as directed and can mix with a glass of water, juice, soda, coffee, or tea. Take if you go more than two days without a movement. Do not use MiraLax more than once per day. Call your doctor if you are still constipated or irregular after using this medication for 7 days in a row.  If you continue to have problems with postoperative constipation, please contact the office for further assistance and recommendations. If you experience "the worst abdominal pain ever" or develop nausea or vomiting, please contact the office immediatly for further recommendations for treatment.  AQUACEL Dressings   Minimize infection and blistering and improve patient satisfaction with AQUACEL Ag Surgical. AQUACEL Ag Surgical dressings combine [employer] proprietary Hydrofiber Technology and hydrocolloid technologies with the antimicrobial action of ionic silver. This powerful combination has been proven to help: Reduce surgical site infection by up to 76% Reduce skin blistering by 88% Reduce the frequency of dressing changes Reduce delayed hospital discharge and readmissions by 80% Whats more, AQUACEL Ag Surgical dressings offer you and your patients: A waterproof barrier to protect skin and allow bathing  Antimicrobial protection*4-6  Comfort and flexibility  Skin-friendly adhesion and removal [employer] Hydrofiber Technology gives AQUACEL dressings powerful capabilities.

## 2014-12-12 NOTE — Progress Notes (Signed)
Physical Therapy Treatment Patient Details Name: RAVYN NIKKEL MRN: [id number] DOB: January 18, 1941 Today's Date: 12/12/2014    History of Present Illness Pt is a 74 year old female s/p L TKA one week ago, onset of L knee weakness on 12/09/14., partial tear of quad  tendon. S/P repair 12/10/14    PT Comments    Pt reports increased fatigue today however ambulated in hallway and performed steps with crutches.  Stair handout left with pt for daughter to review for assisting pt home.  Pt with pain and fatigue end of session, RN into room.   Follow Up Recommendations  Home health PT     Equipment Recommendations  None recommended by PT    Recommendations for Other Services       Precautions / Restrictions Precautions Precautions: Knee Precaution Comments: per Kenard Gower, PA- limit L knee flexion to 90 degrees., gentle Ther ex Required Braces or Orthoses: Knee Immobilizer - Left Knee Immobilizer - Left: On when out of bed or walking Restrictions Other Position/Activity Restrictions: WBAT    Mobility  Bed Mobility Overal bed mobility: Needs Assistance Bed Mobility: Supine to Sit;Sit to Supine     Supine to sit: Min guard Sit to supine: Min assist   General bed mobility comments: support  L leg onto bed due to fatigue and pain  Transfers Overall transfer level: Needs assistance Equipment used: Rolling walker (2 wheeled) Transfers: Sit to/from Stand Sit to Stand: Min guard         General transfer comment: verbal cues for hand placement  Ambulation/Gait Ambulation/Gait assistance: Min guard Ambulation Distance (Feet): 80 Feet Assistive device: Rolling walker (2 wheeled) Gait Pattern/deviations: Antalgic;Step-to pattern     General Gait Details: verbal cues for step length, RW positioning, posture   Stairs Stairs: Yes Stairs assistance: Min assist Stair Management: Forwards;With crutches;Step to pattern Number of Stairs: 2 General stair comments: verbal cues for  sequence, safety, crutch positioning, provided handout for daughter to review for assisting pt into home (daughter not present at time of session), assist to steady upon descending due to LE weakness and fatigue per pt  Wheelchair Mobility    Modified Rankin (Stroke Patients Only)       Balance                                    Cognition Arousal/Alertness: Awake/alert Behavior During Therapy: WFL for tasks assessed/performed Overall Cognitive Status: Within Functional Limits for tasks assessed                      Exercises      General Comments        Pertinent Vitals/Pain Pain Assessment: 0-10 Pain Score: 6  Pain Location: L knee Pain Descriptors / Indicators: Aching;Sore Pain Intervention(s): Limited activity within patient's tolerance;Monitored during session;Premedicated before session;Repositioned;Ice applied    Home Living                      Prior Function            PT Goals (current goals can now be found in the care plan section) Progress towards PT goals: Progressing toward goals    Frequency  7X/week    PT Plan Current plan remains appropriate    Co-evaluation             End of Session Equipment Utilized During Treatment: Left knee  immobilizer;Gait belt Activity Tolerance: Patient limited by fatigue Patient left: in bed;with call bell/phone within reach     Time: 0938-1001 PT Time Calculation (min) (ACUTE ONLY): 23 min  Charges:  $Gait Training: 23-37 mins                    G Codes:      Kiegan Macaraeg,KATHrine E 12/20/14, 12:26 PM Zenovia Jarred, PT, DPT 12-20-14 Pager: [phone]

## 2014-12-12 NOTE — Discharge Summary (Signed)
Physician Discharge Summary   Patient ID: Lauren English MRN: [id number] DOB/AGE: 72-Aug-1942 72 y.o.  Admit date: 12/09/2014 Discharge date: 12/12/2014  Primary Diagnosis:  Left quadriceps partial tendon tear.  Admission Diagnoses:  Past Medical History  Diagnosis Date   PONV (postoperative nausea and vomiting)    Bladder incontinence    OSA on CPAP    Arthritis     "qwhere; feet, knees, neck" (04/21/2012)   Chronic back pain    Closed angle glaucoma     "both eyes" (04/21/2012)   Prediabetes    Urinary tract bacterial infections    Overactive bladder    Numbness     fingertips bilat and feet bilat    Peripheral neuropathy     feet bilat    Discharge Diagnoses:   Active Problems:   Quadriceps tendon rupture  Estimated body mass index is 32.61 kg/(m^2) as calculated from the following:   Height as of this encounter: 5' 10"  (1.778 m).   Weight as of this encounter: 103.1 kg (227 lb 4.7 oz).  Procedure(s) (LRB): REPAIR PARTIAL QUAD TENDON TEAR (Left)   Consults: None  HPI: Ms. Lauren English is a 74 year old female, who underwent left total knee arthroplasty 2 weeks ago. She was doing extremely well and yesterday was walking and felt a pop in the knee. She was unable to extend the knee after that. It was felt that she had possible patellar tendon versus quadriceps tendon rupture. She did not have any abnormalities in her knee x-ray. Given that she was unable to do a straight leg raise and unable to bear weight, it was decided to take her to surgery today for extensor mechanism repair.  Laboratory Data: Admission on 12/09/2014  Component Date Value Ref Range Status   WBC 12/09/2014 6.7  4.0 - 10.5 K/uL Final   RBC 12/09/2014 3.96  3.87 - 5.11 MIL/uL Final   Hemoglobin 12/09/2014 11.0* 12.0 - 15.0 g/dL Final   HCT 12/09/2014 34.6* 36.0 - 46.0 % Final   MCV 12/09/2014 87.4  78.0 - 100.0 fL Final   MCH 12/09/2014 27.8  26.0 - 34.0 pg Final   MCHC  12/09/2014 31.8  30.0 - 36.0 g/dL Final   RDW 12/09/2014 14.4  11.5 - 15.5 % Final   Platelets 12/09/2014 367  150 - 400 K/uL Final   Neutrophils Relative % 12/09/2014 63  43 - 77 % Final   Neutro Abs 12/09/2014 4.2  1.7 - 7.7 K/uL Final   Lymphocytes Relative 12/09/2014 28  12 - 46 % Final   Lymphs Abs 12/09/2014 1.9  0.7 - 4.0 K/uL Final   Monocytes Relative 12/09/2014 7  3 - 12 % Final   Monocytes Absolute 12/09/2014 0.5  0.1 - 1.0 K/uL Final   Eosinophils Relative 12/09/2014 2  0 - 5 % Final   Eosinophils Absolute 12/09/2014 0.1  0.0 - 0.7 K/uL Final   Basophils Relative 12/09/2014 0  0 - 1 % Final   Basophils Absolute 12/09/2014 0.0  0.0 - 0.1 K/uL Final   Sodium 12/09/2014 140  135 - 145 mmol/L Final   Potassium 12/09/2014 4.2  3.5 - 5.1 mmol/L Final   Chloride 12/09/2014 106  101 - 111 mmol/L Final   CO2 12/09/2014 24  22 - 32 mmol/L Final   Glucose, Bld 12/09/2014 97  65 - 99 mg/dL Final   BUN 12/09/2014 11  6 - 20 mg/dL Final   Creatinine, Ser 12/09/2014 0.81  0.44 - 1.00 mg/dL Final  Calcium 12/09/2014 9.0  8.9 - 10.3 mg/dL Final   GFR calc non Af Amer 12/09/2014 >60  >60 mL/min Final   GFR calc Af Amer 12/09/2014 >60  >60 mL/min Final   Comment: (NOTE) The eGFR has been calculated using the CKD EPI equation. This calculation has not been validated in all clinical situations. eGFR's persistently <60 mL/min signify possible Chronic Kidney Disease.    Anion gap 12/09/2014 10  5 - 15 Final   Color, Urine 12/09/2014 YELLOW  YELLOW Final   APPearance 12/09/2014 CLOUDY* CLEAR Final   Specific Gravity, Urine 12/09/2014 1.009  1.005 - 1.030 Final   pH 12/09/2014 7.0  5.0 - 8.0 Final   Glucose, UA 12/09/2014 NEGATIVE  NEGATIVE mg/dL Final   Hgb urine dipstick 12/09/2014 NEGATIVE  NEGATIVE Final   Bilirubin Urine 12/09/2014 NEGATIVE  NEGATIVE Final   Ketones, ur 12/09/2014 NEGATIVE  NEGATIVE mg/dL Final   Protein, ur 12/09/2014 NEGATIVE  NEGATIVE  mg/dL Final   Urobilinogen, UA 12/09/2014 1.0  0.0 - 1.0 mg/dL Final   Nitrite 12/09/2014 POSITIVE* NEGATIVE Final   Leukocytes, UA 12/09/2014 SMALL* NEGATIVE Final   Squamous Epithelial / LPF 12/09/2014 RARE  RARE Final   WBC, UA 12/09/2014 7-10  <3 WBC/hpf Final   Bacteria, UA 12/09/2014 MANY* RARE Final   Prothrombin Time 12/09/2014 14.2  11.6 - 15.2 seconds Final   INR 12/09/2014 1.08  0.00 - 1.49 Final   MRSA, PCR 12/10/2014 NEGATIVE  NEGATIVE Final   Staphylococcus aureus 12/10/2014 NEGATIVE  NEGATIVE Final   Comment:        The Xpert SA Assay (FDA approved for NASAL specimens in patients over 51 years of age), is one component of a comprehensive surveillance program.  Test performance has been validated by [facility] for patients greater than or equal to 22 year old. It is not intended to diagnose infection nor to guide or monitor treatment.   Admission on 11/26/2014, Discharged on 11/28/2014  Component Date Value Ref Range Status   WBC 11/27/2014 7.2  4.0 - 10.5 K/uL Final   RBC 11/27/2014 3.78* 3.87 - 5.11 MIL/uL Final   Hemoglobin 11/27/2014 10.7* 12.0 - 15.0 g/dL Final   HCT 11/27/2014 33.0* 36.0 - 46.0 % Final   MCV 11/27/2014 87.3  78.0 - 100.0 fL Final   MCH 11/27/2014 28.3  26.0 - 34.0 pg Final   MCHC 11/27/2014 32.4  30.0 - 36.0 g/dL Final   RDW 11/27/2014 13.9  11.5 - 15.5 % Final   Platelets 11/27/2014 169  150 - 400 K/uL Final   Sodium 11/27/2014 138  135 - 145 mmol/L Final   Potassium 11/27/2014 4.2  3.5 - 5.1 mmol/L Final   Chloride 11/27/2014 108  101 - 111 mmol/L Final   CO2 11/27/2014 25  22 - 32 mmol/L Final   Glucose, Bld 11/27/2014 134* 65 - 99 mg/dL Final   BUN 11/27/2014 10  6 - 20 mg/dL Final   Creatinine, Ser 11/27/2014 0.63  0.44 - 1.00 mg/dL Final   Calcium 11/27/2014 8.1* 8.9 - 10.3 mg/dL Final   GFR calc non Af Amer 11/27/2014 >60  >60 mL/min Final   GFR calc Af Amer 11/27/2014 >60  >60 mL/min Final    Comment: (NOTE) The eGFR has been calculated using the CKD EPI equation. This calculation has not been validated in all clinical situations. eGFR's persistently <60 mL/min signify possible Chronic Kidney Disease.    Anion gap 11/27/2014 5  5 - 15 Final  WBC 11/28/2014 6.8  4.0 - 10.5 K/uL Final   RBC 11/28/2014 3.39* 3.87 - 5.11 MIL/uL Final   Hemoglobin 11/28/2014 9.5* 12.0 - 15.0 g/dL Final   HCT 11/28/2014 29.5* 36.0 - 46.0 % Final   MCV 11/28/2014 87.0  78.0 - 100.0 fL Final   MCH 11/28/2014 28.0  26.0 - 34.0 pg Final   MCHC 11/28/2014 32.2  30.0 - 36.0 g/dL Final   RDW 11/28/2014 14.0  11.5 - 15.5 % Final   Platelets 11/28/2014 174  150 - 400 K/uL Final   Sodium 11/28/2014 141  135 - 145 mmol/L Final   Potassium 11/28/2014 4.0  3.5 - 5.1 mmol/L Final   Chloride 11/28/2014 109  101 - 111 mmol/L Final   CO2 11/28/2014 28  22 - 32 mmol/L Final   Glucose, Bld 11/28/2014 106* 65 - 99 mg/dL Final   BUN 11/28/2014 13  6 - 20 mg/dL Final   Creatinine, Ser 11/28/2014 0.78  0.44 - 1.00 mg/dL Final   Calcium 11/28/2014 8.2* 8.9 - 10.3 mg/dL Final   GFR calc non Af Amer 11/28/2014 >60  >60 mL/min Final   GFR calc Af Amer 11/28/2014 >60  >60 mL/min Final   Comment: (NOTE) The eGFR has been calculated using the CKD EPI equation. This calculation has not been validated in all clinical situations. eGFR's persistently <60 mL/min signify possible Chronic Kidney Disease.    Anion gap 11/28/2014 4* 5 - 15 Final  Hospital Outpatient Visit on 11/19/2014  Component Date Value Ref Range Status   aPTT 11/19/2014 29  24 - 37 seconds Final   WBC 11/19/2014 6.6  4.0 - 10.5 K/uL Final   RBC 11/19/2014 4.76  3.87 - 5.11 MIL/uL Final   Hemoglobin 11/19/2014 13.3  12.0 - 15.0 g/dL Final   HCT 11/19/2014 40.8  36.0 - 46.0 % Final   MCV 11/19/2014 85.7  78.0 - 100.0 fL Final   MCH 11/19/2014 27.9  26.0 - 34.0 pg Final   MCHC 11/19/2014 32.6  30.0 - 36.0 g/dL Final   RDW  11/19/2014 13.7  11.5 - 15.5 % Final   Platelets 11/19/2014 194  150 - 400 K/uL Final   Sodium 11/19/2014 139  135 - 145 mmol/L Final   Potassium 11/19/2014 4.0  3.5 - 5.1 mmol/L Final   Chloride 11/19/2014 105  101 - 111 mmol/L Final   CO2 11/19/2014 24  22 - 32 mmol/L Final   Glucose, Bld 11/19/2014 93  65 - 99 mg/dL Final   BUN 11/19/2014 17  6 - 20 mg/dL Final   Creatinine, Ser 11/19/2014 0.84  0.44 - 1.00 mg/dL Final   Calcium 11/19/2014 9.0  8.9 - 10.3 mg/dL Final   Total Protein 11/19/2014 7.0  6.5 - 8.1 g/dL Final   Albumin 11/19/2014 4.0  3.5 - 5.0 g/dL Final   AST 11/19/2014 20  15 - 41 U/L Final   ALT 11/19/2014 18  14 - 54 U/L Final   Alkaline Phosphatase 11/19/2014 49  38 - 126 U/L Final   Total Bilirubin 11/19/2014 0.5  0.3 - 1.2 mg/dL Final   GFR calc non Af Amer 11/19/2014 >60  >60 mL/min Final   GFR calc Af Amer 11/19/2014 >60  >60 mL/min Final   Comment: (NOTE) The eGFR has been calculated using the CKD EPI equation. This calculation has not been validated in all clinical situations. eGFR's persistently <60 mL/min signify possible Chronic Kidney Disease.    Anion gap 11/19/2014 10  5 - 15 Final   Prothrombin Time 11/19/2014 13.2  11.6 - 15.2 seconds Final   INR 11/19/2014 0.98  0.00 - 1.49 Final   ABO/RH(D) 11/19/2014 O POS   Final   Antibody Screen 11/19/2014 NEG   Final   Sample Expiration 11/19/2014 11/29/2014   Final   Color, Urine 11/19/2014 YELLOW  YELLOW Final   APPearance 11/19/2014 CLOUDY* CLEAR Final   Specific Gravity, Urine 11/19/2014 1.023  1.005 - 1.030 Final   pH 11/19/2014 7.0  5.0 - 8.0 Final   Glucose, UA 11/19/2014 NEGATIVE  NEGATIVE mg/dL Final   Hgb urine dipstick 11/19/2014 NEGATIVE  NEGATIVE Final   Bilirubin Urine 11/19/2014 NEGATIVE  NEGATIVE Final   Ketones, ur 11/19/2014 NEGATIVE  NEGATIVE mg/dL Final   Protein, ur 11/19/2014 NEGATIVE  NEGATIVE mg/dL Final   Urobilinogen, UA 11/19/2014 1.0  0.0 - 1.0  mg/dL Final   Nitrite 11/19/2014 NEGATIVE  NEGATIVE Final   Leukocytes, UA 11/19/2014 LARGE* NEGATIVE Final   MRSA, PCR 11/19/2014 POSITIVE* NEGATIVE Final   AFTERHOURS   Staphylococcus aureus 11/19/2014 POSITIVE* NEGATIVE Final   Comment:        The Xpert SA Assay (FDA approved for NASAL specimens in patients over 88 years of age), is one component of a comprehensive surveillance program.  Test performance has been validated by [facility] for patients greater than or equal to 69 year old. It is not intended to diagnose infection nor to guide or monitor treatment.    Squamous Epithelial / LPF 11/19/2014 MANY* RARE Final   WBC, UA 11/19/2014 21-50  <3 WBC/hpf Final   Bacteria, UA 11/19/2014 FEW* RARE Final   ABO/RH(D) 11/19/2014 O POS   Final     X-Rays:X-ray Chest Pa And Lateral  12/09/2014   CLINICAL DATA:  Preop for right knee surgery.  No chest complaints.  EXAM: CHEST  2 VIEW  COMPARISON:  12/28/2005  FINDINGS: Borderline cardiomegaly with normal heart and mediastinal morphology. Faint linear density in the left lower lung, new from 2007 but likely scarring given morphology. There is no edema, consolidation, effusion, or pneumothorax.  IMPRESSION: No active cardiopulmonary disease.   Electronically Signed   By: Monte Fantasia M.D.   On: 12/09/2014 20:28   Dg Knee Complete 4 Views Left  12/09/2014   CLINICAL DATA:  Two weeks postop from left knee arthroplasty. Worsening left knee pain, erythema, and swelling beginning today.  EXAM: LEFT KNEE - COMPLETE 4+ VIEW  COMPARISON:  None.  FINDINGS: Prominent anterior soft tissue swelling is seen. No evidence of soft tissue gas. No evidence of knee joint effusion.  Total knee arthroplasty is seen in appropriate position. No evidence of fracture or dislocation. No other significant bone abnormality identified.  IMPRESSION: Prominent anterior soft tissue swelling. No evidence of joint effusion or osseous abnormality.   Electronically  Signed   By: Earle Gell M.D.   On: 12/09/2014 15:18    EKG: Orders placed or performed during the hospital encounter of 11/19/14   EKG 12-Lead   EKG 12-Lead     Hospital Course: Patient  Was 2 weeks s/p revision left TKR. Doing well until today. Ambulating according to post-op protocal when she felt and heard a loud pop. Unable to ambulate since due to pain. Slight increase in swelling - denies fevers/chills. She does note that yesterday she felt dysuria.  Clinical exam consistent with quad tendon rupture so the patient was admitted to [facility] She was seen by Dr. Wynelle Link and  taken to the OR the following day and underwent the above state procedure without complications.  Patient tolerated the procedure well and was later transferred to the recovery room and then to the orthopaedic floor for postoperative care.  They were given PO and IV analgesics for pain control following their surgery.  They were given 24 hours of postoperative antibiotics of  Anti-infectives    Start     Dose/Rate Route Frequency Ordered Stop   12/11/14 0400  vancomycin (VANCOCIN) IVPB 1000 mg/200 mL premix     1,000 mg 200 mL/hr over 60 Minutes Intravenous Every 12 hours 12/10/14 1956 12/11/14 0520   12/10/14 1500  vancomycin (VANCOCIN) 1,500 mg in sodium chloride 0.9 % 500 mL IVPB     1,500 mg 250 mL/hr over 120 Minutes Intravenous 60 min pre-op 12/10/14 0701 12/10/14 1814     and started on DVT prophylaxis in the form of Xarelto. Patient was encouraged to get up and ambulate the day after surgery.  The patient was allowed to be WBAT with therapy. Discharge planning was consulted to help with postop disposition and equipment needs.  Patient had a decent night on the evening of surgery.  They started to get up OOB with therapy on day one.  Patient was instructed not to bend beyond 90 degrees of flexion for the first two weeks of therapy.  Patient was seen in rounds on the following day and was ready to go  home following morning ambulation.   Discharge home Diet - Diabetic diet Follow up - in 2 weeks Activity - WBAT Disposition - Home Condition Upon Discharge - Stable D/C Meds - See DC Summary DVT Prophylaxis - Xarelto Do not bend beyond 90 degrees of flexion for the first two weeks of therapy.  Discharge Instructions    Call MD / Call 911    Complete by:  As directed   If you experience chest pain or shortness of breath, CALL 911 and be transported to the hospital emergency room.  If you develope a fever above 101 F, pus (white drainage) or increased drainage or redness at the wound, or calf pain, call your surgeon's office.     Call MD / Call 911    Complete by:  As directed   If you experience chest pain or shortness of breath, CALL 911 and be transported to the hospital emergency room.  If you develope a fever above 101 F, pus (white drainage) or increased drainage or redness at the wound, or calf pain, call your surgeon's office.     Change dressing    Complete by:  As directed   Change dressing daily with sterile 4 x 4 inch gauze dressing and apply TED hose. Do not submerge the incision under water.     Change dressing    Complete by:  As directed   Change dressing daily with sterile 4 x 4 inch gauze dressing and apply TED hose. Do not submerge the incision under water.     Constipation Prevention    Complete by:  As directed   Drink plenty of fluids.  Prune juice may be helpful.  You may use a stool softener, such as Colace (over the counter) 100 mg twice a day.  Use MiraLax (over the counter) for constipation as needed.     Constipation Prevention    Complete by:  As directed   Drink plenty of fluids.  Prune juice may be helpful.  You may use a stool softener, such  as Colace (over the counter) 100 mg twice a day.  Use MiraLax (over the counter) for constipation as needed.     Diet - low sodium heart healthy    Complete by:  As directed      Diet general    Complete by:  As  directed      Discharge instructions    Complete by:  As directed   Pick up stool softner and laxative for home use following surgery while on pain medications. Do not submerge incision under water. Please use good hand washing techniques while changing dressing each day. May shower starting three days after surgery. Please use a clean towel to pat the incision dry following showers. Continue to use ice for pain and swelling after surgery. Do not use any lotions or creams on the incision until instructed by your surgeon.  Take Xarelto for two and a half more weeks, then discontinue Xarelto. Once the patient has completed the blood thinner regimen, then take a Baby 81 mg Aspirin daily for three more weeks.  Postoperative Constipation Protocol  Constipation - defined medically as fewer than three stools per week and severe constipation as less than one stool per week.  One of the most common issues patients have following surgery is constipation.  Even if you have a regular bowel pattern at home, your normal regimen is likely to be disrupted due to multiple reasons following surgery.  Combination of anesthesia, postoperative narcotics, change in appetite and fluid intake all can affect your bowels.  In order to avoid complications following surgery, here are some recommendations in order to help you during your recovery period.  Colace (docusate) - Pick up an over-the-counter form of Colace or another stool softener and take twice a day as long as you are requiring postoperative pain medications.  Take with a full glass of water daily.  If you experience loose stools or diarrhea, hold the colace until you stool forms back up.  If your symptoms do not get better within 1 week or if they get worse, check with your doctor.  Dulcolax (bisacodyl) - Pick up over-the-counter and take as directed by the product packaging as needed to assist with the movement of your bowels.  Take with a full glass of  water.  Use this product as needed if not relieved by Colace only.   MiraLax (polyethylene glycol) - Pick up over-the-counter to have on hand.  MiraLax is a solution that will increase the amount of water in your bowels to assist with bowel movements.  Take as directed and can mix with a glass of water, juice, soda, coffee, or tea.  Take if you go more than two days without a movement. Do not use MiraLax more than once per day. Call your doctor if you are still constipated or irregular after using this medication for 7 days in a row.  If you continue to have problems with postoperative constipation, please contact the office for further assistance and recommendations.  If you experience "the worst abdominal pain ever" or develop nausea or vomiting, please contact the office immediatly for further recommendations for treatment.  DO NOT BEND BEYOND 90 DEGREES OF FLEXION FOR THE FIRST TWO WEEKS.     Discharge instructions    Complete by:  As directed   Pick up stool softner and laxative for home use following surgery while on pain medications. Do not submerge incision under water. Please use good hand washing techniques while changing dressing each day.  May shower starting three days after surgery. Please use a clean towel to pat the incision dry following showers. Continue to use ice for pain and swelling after surgery. Do not use any lotions or creams on the incision until instructed by your surgeon. Hip precautions.  Total Hip Protocol.  Take the remaining Xarelto at home and then switch to a baby 81 mg Aspirin for an additional three weeks.  Postoperative Constipation Protocol  Constipation - defined medically as fewer than three stools per week and severe constipation as less than one stool per week.  One of the most common issues patients have following surgery is constipation.  Even if you have a regular bowel pattern at home, your normal regimen is likely to be disrupted due to  multiple reasons following surgery.  Combination of anesthesia, postoperative narcotics, change in appetite and fluid intake all can affect your bowels.  In order to avoid complications following surgery, here are some recommendations in order to help you during your recovery period.  Colace (docusate) - Pick up an over-the-counter form of Colace or another stool softener and take twice a day as long as you are requiring postoperative pain medications.  Take with a full glass of water daily.  If you experience loose stools or diarrhea, hold the colace until you stool forms back up.  If your symptoms do not get better within 1 week or if they get worse, check with your doctor.  Dulcolax (bisacodyl) - Pick up over-the-counter and take as directed by the product packaging as needed to assist with the movement of your bowels.  Take with a full glass of water.  Use this product as needed if not relieved by Colace only.   MiraLax (polyethylene glycol) - Pick up over-the-counter to have on hand.  MiraLax is a solution that will increase the amount of water in your bowels to assist with bowel movements.  Take as directed and can mix with a glass of water, juice, soda, coffee, or tea.  Take if you go more than two days without a movement. Do not use MiraLax more than once per day. Call your doctor if you are still constipated or irregular after using this medication for 7 days in a row.  If you continue to have problems with postoperative constipation, please contact the office for further assistance and recommendations.  If you experience "the worst abdominal pain ever" or develop nausea or vomiting, please contact the office immediatly for further recommendations for treatment.  AQUACEL Dressings   Minimize infection and blistering and improve patient satisfaction with AQUACEL Ag Surgical. AQUACEL Ag Surgical dressings combine [employer] proprietary Hydrofiber Technology and hydrocolloid technologies with  the antimicrobial action of ionic silver. This powerful combination has been proven to help: Reduce surgical site infection by up to 76% Reduce skin blistering by 88% Reduce the frequency of dressing changes Reduce delayed hospital discharge and readmissions by 80% Whats more, AQUACEL Ag Surgical dressings offer you and your patients: A waterproof barrier to protect skin and allow bathing  Antimicrobial protection*4-6  Comfort and flexibility  Skin-friendly adhesion and removal [employer] Hydrofiber Technology gives AQUACEL dressings powerful capabilities.     Do not put a pillow under the knee. Place it under the heel.    Complete by:  As directed      Do not put a pillow under the knee. Place it under the heel.    Complete by:  As directed      Do not sit on  low chairs, stoools or toilet seats, as it may be difficult to get up from low surfaces    Complete by:  As directed      Do not sit on low chairs, stoools or toilet seats, as it may be difficult to get up from low surfaces    Complete by:  As directed      Driving restrictions    Complete by:  As directed   No driving until released by the physician.     Driving restrictions    Complete by:  As directed   No driving until released by the physician.     Increase activity slowly as tolerated    Complete by:  As directed      Increase activity slowly as tolerated    Complete by:  As directed      Lifting restrictions    Complete by:  As directed   No lifting until released by the physician.     Lifting restrictions    Complete by:  As directed   No lifting until released by the physician.     Patient may shower    Complete by:  As directed   You may shower without a dressing once there is no drainage.  Do not wash over the wound.  If drainage remains, do not shower until drainage stops.     Patient may shower    Complete by:  As directed   You may shower without a dressing once there is no drainage.  Do not wash over  the wound.  If drainage remains, do not shower until drainage stops.     TED hose    Complete by:  As directed   Use stockings (TED hose) for 3 weeks on both leg(s).  You may remove them at night for sleeping.     TED hose    Complete by:  As directed   Use stockings (TED hose) for 3 weeks on both leg(s).  You may remove them at night for sleeping.     Weight bearing as tolerated    Complete by:  As directed   Laterality:  left  Extremity:  Lower     Weight bearing as tolerated    Complete by:  As directed   Laterality:  left  Extremity:  Lower            Medication List    STOP taking these medications        rivaroxaban 10 MG Tabs tablet  Commonly known as:  XARELTO      TAKE these medications        docusate sodium 100 MG capsule  Commonly known as:  COLACE  Take 100 mg by mouth 2 (two) times daily.     magic mouthwash Soln  Take 5 mLs by mouth 4 (four) times daily. For throat     methocarbamol 500 MG tablet  Commonly known as:  ROBAXIN  Take 1 tablet (500 mg total) by mouth every 6 (six) hours as needed for muscle spasms.     OVER THE COUNTER MEDICATION  Take 2 tablets by mouth every evening.     oxyCODONE 5 MG immediate release tablet  Commonly known as:  Oxy IR/ROXICODONE  Take 1-2 tablets (5-10 mg total) by mouth every 3 (three) hours as needed for moderate pain or severe pain.     solifenacin 10 MG tablet  Commonly known as:  VESICARE  Take 10 mg by mouth daily.  traMADol 50 MG tablet  Commonly known as:  ULTRAM  Take 1-2 tablets (50-100 mg total) by mouth every 6 (six) hours as needed (mild pain).           Follow-up Information    Follow up with [facility].   Why:  Pricilla Holm has been requested for your PT   Contact information:   [place] [address] [phone]       Follow up with Gearlean Alf, MD. Schedule an appointment as soon as possible for a visit on 12/25/2014.   Specialty:  Orthopedic  Surgery   Why:  Call office at [phone] to setup appointment with Dr. Wynelle Link on Tuesday 12/25/2014   Contact information:   [address] [place] [address] [phone]       Signed: Arlee Muslim, PA-C Orthopaedic Surgery 12/12/2014, 8:09 AM

## 2014-12-12 NOTE — Progress Notes (Signed)
   Subjective: 2 Days Post-Op Procedure(s) (LRB): REPAIR PARTIAL QUAD TENDON TEAR (Left) Patient reports pain as mild.   Patient seen in rounds with Dr. Lequita Halt. Patient is well, but has had some minor complaints of pain in the knee, requiring pain medications Patient is ready to go home  Objective: Vital signs in last 24 hours: Temp:  [98.1 F (36.7 C)-101.2 F (38.4 C)] 99.4 F (37.4 C) (08/03 0637) Pulse Rate:  [78-89] 83 (08/03 0508) Resp:  [16-20] 16 (08/03 0508) BP: (128-151)/(44-57) 128/46 mmHg (08/03 0508) SpO2:  [93 %-100 %] 97 % (08/03 0508)  Intake/Output from previous day:  Intake/Output Summary (Last 24 hours) at 12/12/14 0803 Last data filed at 12/12/14 0600  Gross per 24 hour  Intake 1464.67 ml  Output   1210 ml  Net 254.67 ml    Labs:  Recent Labs  12/09/14 1637  HGB 11.0*    Recent Labs  12/09/14 1637  WBC 6.7  RBC 3.96  HCT 34.6*  PLT 367    Recent Labs  12/09/14 1637  NA 140  K 4.2  CL 106  CO2 24  BUN 11  CREATININE 0.81  GLUCOSE 97  CALCIUM 9.0    Recent Labs  12/09/14 1950  INR 1.08    EXAM: General - Patient is Alert and Appropriate Extremity - Neurovascular intact Sensation intact distally Incision - clean, dry Motor Function - intact, moving foot and toes well on exam.   Assessment/Plan: 2 Days Post-Op Procedure(s) (LRB): REPAIR PARTIAL QUAD TENDON TEAR (Left) Procedure(s) (LRB): REPAIR PARTIAL QUAD TENDON TEAR (Left) Past Medical History  Diagnosis Date  . PONV (postoperative nausea and vomiting)   . Bladder incontinence   . OSA on CPAP   . Arthritis     "qwhere; feet, knees, neck" (04/21/2012)  . Chronic back pain   . Closed angle glaucoma     "both eyes" (04/21/2012)  . Prediabetes   . Urinary tract bacterial infections   . Overactive bladder   . Numbness     fingertips bilat and feet bilat   . Peripheral neuropathy     feet bilat    Active Problems:   Quadriceps tendon rupture  Estimated  body mass index is 32.61 kg/(m^2) as calculated from the following:   Height as of this encounter:  (1.778 m).   Weight as of this encounter: 103.1 kg (227 lb 4.7 oz). Up with therapy Discharge home Diet - Diabetic diet Follow up - in 2 weeks Activity - WBAT Disposition - Home Condition Upon Discharge - Stable D/C Meds - See DC Summary DVT Prophylaxis - Xarelto Do not bend beyond 90 degrees of flexion for the first two weeks of therapy.  Avel Peace, PA-C Orthopaedic Surgery 12/12/2014, 8:03 AM

## 2014-12-17 ENCOUNTER — Encounter: Payer: Medicare Other | Admitting: Physical Therapy

## 2014-12-17 ENCOUNTER — Ambulatory Visit: Payer: Medicare Other

## 2014-12-17 DIAGNOSIS — Z96652 Presence of left artificial knee joint: Secondary | ICD-10-CM | POA: Diagnosis not present

## 2014-12-17 DIAGNOSIS — M1712 Unilateral primary osteoarthritis, left knee: Secondary | ICD-10-CM | POA: Diagnosis not present

## 2014-12-17 DIAGNOSIS — Z471 Aftercare following joint replacement surgery: Secondary | ICD-10-CM | POA: Diagnosis not present

## 2014-12-18 DIAGNOSIS — G629 Polyneuropathy, unspecified: Secondary | ICD-10-CM | POA: Diagnosis not present

## 2014-12-18 DIAGNOSIS — Z471 Aftercare following joint replacement surgery: Secondary | ICD-10-CM | POA: Diagnosis not present

## 2014-12-18 DIAGNOSIS — M549 Dorsalgia, unspecified: Secondary | ICD-10-CM | POA: Diagnosis not present

## 2014-12-18 DIAGNOSIS — Z96652 Presence of left artificial knee joint: Secondary | ICD-10-CM | POA: Diagnosis not present

## 2014-12-18 DIAGNOSIS — M1990 Unspecified osteoarthritis, unspecified site: Secondary | ICD-10-CM | POA: Diagnosis not present

## 2014-12-18 DIAGNOSIS — S76112D Strain of left quadriceps muscle, fascia and tendon, subsequent encounter: Secondary | ICD-10-CM | POA: Diagnosis not present

## 2014-12-19 ENCOUNTER — Encounter: Payer: Medicare Other | Admitting: Physical Therapy

## 2014-12-19 DIAGNOSIS — Z96652 Presence of left artificial knee joint: Secondary | ICD-10-CM | POA: Diagnosis not present

## 2014-12-19 DIAGNOSIS — Z471 Aftercare following joint replacement surgery: Secondary | ICD-10-CM | POA: Diagnosis not present

## 2014-12-20 DIAGNOSIS — M549 Dorsalgia, unspecified: Secondary | ICD-10-CM | POA: Diagnosis not present

## 2014-12-20 DIAGNOSIS — S76112D Strain of left quadriceps muscle, fascia and tendon, subsequent encounter: Secondary | ICD-10-CM | POA: Diagnosis not present

## 2014-12-20 DIAGNOSIS — Z471 Aftercare following joint replacement surgery: Secondary | ICD-10-CM | POA: Diagnosis not present

## 2014-12-20 DIAGNOSIS — G629 Polyneuropathy, unspecified: Secondary | ICD-10-CM | POA: Diagnosis not present

## 2014-12-20 DIAGNOSIS — M1990 Unspecified osteoarthritis, unspecified site: Secondary | ICD-10-CM | POA: Diagnosis not present

## 2014-12-20 DIAGNOSIS — Z96652 Presence of left artificial knee joint: Secondary | ICD-10-CM | POA: Diagnosis not present

## 2014-12-21 DIAGNOSIS — Z471 Aftercare following joint replacement surgery: Secondary | ICD-10-CM | POA: Diagnosis not present

## 2014-12-21 DIAGNOSIS — Z96652 Presence of left artificial knee joint: Secondary | ICD-10-CM | POA: Diagnosis not present

## 2014-12-22 DIAGNOSIS — Z96652 Presence of left artificial knee joint: Secondary | ICD-10-CM | POA: Diagnosis not present

## 2014-12-22 DIAGNOSIS — M1990 Unspecified osteoarthritis, unspecified site: Secondary | ICD-10-CM | POA: Diagnosis not present

## 2014-12-22 DIAGNOSIS — Z471 Aftercare following joint replacement surgery: Secondary | ICD-10-CM | POA: Diagnosis not present

## 2014-12-22 DIAGNOSIS — G629 Polyneuropathy, unspecified: Secondary | ICD-10-CM | POA: Diagnosis not present

## 2014-12-22 DIAGNOSIS — S76112D Strain of left quadriceps muscle, fascia and tendon, subsequent encounter: Secondary | ICD-10-CM | POA: Diagnosis not present

## 2014-12-22 DIAGNOSIS — M549 Dorsalgia, unspecified: Secondary | ICD-10-CM | POA: Diagnosis not present

## 2014-12-23 DIAGNOSIS — G629 Polyneuropathy, unspecified: Secondary | ICD-10-CM | POA: Diagnosis not present

## 2014-12-23 DIAGNOSIS — M1990 Unspecified osteoarthritis, unspecified site: Secondary | ICD-10-CM | POA: Diagnosis not present

## 2014-12-23 DIAGNOSIS — Z96652 Presence of left artificial knee joint: Secondary | ICD-10-CM | POA: Diagnosis not present

## 2014-12-23 DIAGNOSIS — S76112D Strain of left quadriceps muscle, fascia and tendon, subsequent encounter: Secondary | ICD-10-CM | POA: Diagnosis not present

## 2014-12-23 DIAGNOSIS — M549 Dorsalgia, unspecified: Secondary | ICD-10-CM | POA: Diagnosis not present

## 2014-12-23 DIAGNOSIS — Z471 Aftercare following joint replacement surgery: Secondary | ICD-10-CM | POA: Diagnosis not present

## 2014-12-24 ENCOUNTER — Encounter: Payer: Medicare Other | Admitting: Physical Therapy

## 2014-12-24 DIAGNOSIS — Z96652 Presence of left artificial knee joint: Secondary | ICD-10-CM | POA: Diagnosis not present

## 2014-12-24 DIAGNOSIS — Z471 Aftercare following joint replacement surgery: Secondary | ICD-10-CM | POA: Diagnosis not present

## 2014-12-25 DIAGNOSIS — Z96652 Presence of left artificial knee joint: Secondary | ICD-10-CM | POA: Diagnosis not present

## 2014-12-25 DIAGNOSIS — Z471 Aftercare following joint replacement surgery: Secondary | ICD-10-CM | POA: Diagnosis not present

## 2014-12-26 DIAGNOSIS — G629 Polyneuropathy, unspecified: Secondary | ICD-10-CM | POA: Diagnosis not present

## 2014-12-26 DIAGNOSIS — Z471 Aftercare following joint replacement surgery: Secondary | ICD-10-CM | POA: Diagnosis not present

## 2014-12-26 DIAGNOSIS — S76112D Strain of left quadriceps muscle, fascia and tendon, subsequent encounter: Secondary | ICD-10-CM | POA: Diagnosis not present

## 2014-12-26 DIAGNOSIS — M1990 Unspecified osteoarthritis, unspecified site: Secondary | ICD-10-CM | POA: Diagnosis not present

## 2014-12-26 DIAGNOSIS — Z96652 Presence of left artificial knee joint: Secondary | ICD-10-CM | POA: Diagnosis not present

## 2014-12-26 DIAGNOSIS — M549 Dorsalgia, unspecified: Secondary | ICD-10-CM | POA: Diagnosis not present

## 2014-12-27 ENCOUNTER — Encounter: Payer: Medicare Other | Admitting: Physical Therapy

## 2014-12-27 DIAGNOSIS — M549 Dorsalgia, unspecified: Secondary | ICD-10-CM | POA: Diagnosis not present

## 2014-12-27 DIAGNOSIS — S76112D Strain of left quadriceps muscle, fascia and tendon, subsequent encounter: Secondary | ICD-10-CM | POA: Diagnosis not present

## 2014-12-27 DIAGNOSIS — M1990 Unspecified osteoarthritis, unspecified site: Secondary | ICD-10-CM | POA: Diagnosis not present

## 2014-12-27 DIAGNOSIS — G629 Polyneuropathy, unspecified: Secondary | ICD-10-CM | POA: Diagnosis not present

## 2014-12-27 DIAGNOSIS — Z96652 Presence of left artificial knee joint: Secondary | ICD-10-CM | POA: Diagnosis not present

## 2014-12-27 DIAGNOSIS — Z471 Aftercare following joint replacement surgery: Secondary | ICD-10-CM | POA: Diagnosis not present

## 2014-12-28 DIAGNOSIS — Z96652 Presence of left artificial knee joint: Secondary | ICD-10-CM | POA: Diagnosis not present

## 2014-12-28 DIAGNOSIS — Z471 Aftercare following joint replacement surgery: Secondary | ICD-10-CM | POA: Diagnosis not present

## 2014-12-31 DIAGNOSIS — Z96652 Presence of left artificial knee joint: Secondary | ICD-10-CM | POA: Diagnosis not present

## 2014-12-31 DIAGNOSIS — G629 Polyneuropathy, unspecified: Secondary | ICD-10-CM | POA: Diagnosis not present

## 2014-12-31 DIAGNOSIS — M549 Dorsalgia, unspecified: Secondary | ICD-10-CM | POA: Diagnosis not present

## 2014-12-31 DIAGNOSIS — M1990 Unspecified osteoarthritis, unspecified site: Secondary | ICD-10-CM | POA: Diagnosis not present

## 2014-12-31 DIAGNOSIS — S76112D Strain of left quadriceps muscle, fascia and tendon, subsequent encounter: Secondary | ICD-10-CM | POA: Diagnosis not present

## 2014-12-31 DIAGNOSIS — Z471 Aftercare following joint replacement surgery: Secondary | ICD-10-CM | POA: Diagnosis not present

## 2015-01-01 DIAGNOSIS — Z471 Aftercare following joint replacement surgery: Secondary | ICD-10-CM | POA: Diagnosis not present

## 2015-01-01 DIAGNOSIS — Z96652 Presence of left artificial knee joint: Secondary | ICD-10-CM | POA: Diagnosis not present

## 2015-01-03 ENCOUNTER — Encounter: Payer: Medicare Other | Admitting: Physical Therapy

## 2015-01-04 DIAGNOSIS — Z471 Aftercare following joint replacement surgery: Secondary | ICD-10-CM | POA: Diagnosis not present

## 2015-01-04 DIAGNOSIS — Z96652 Presence of left artificial knee joint: Secondary | ICD-10-CM | POA: Diagnosis not present

## 2015-01-08 DIAGNOSIS — Z471 Aftercare following joint replacement surgery: Secondary | ICD-10-CM | POA: Diagnosis not present

## 2015-01-08 DIAGNOSIS — Z96652 Presence of left artificial knee joint: Secondary | ICD-10-CM | POA: Diagnosis not present

## 2015-01-09 DIAGNOSIS — S76112D Strain of left quadriceps muscle, fascia and tendon, subsequent encounter: Secondary | ICD-10-CM | POA: Diagnosis not present

## 2015-01-09 DIAGNOSIS — M1990 Unspecified osteoarthritis, unspecified site: Secondary | ICD-10-CM | POA: Diagnosis not present

## 2015-01-09 DIAGNOSIS — Z471 Aftercare following joint replacement surgery: Secondary | ICD-10-CM | POA: Diagnosis not present

## 2015-01-09 DIAGNOSIS — M549 Dorsalgia, unspecified: Secondary | ICD-10-CM | POA: Diagnosis not present

## 2015-01-09 DIAGNOSIS — G629 Polyneuropathy, unspecified: Secondary | ICD-10-CM | POA: Diagnosis not present

## 2015-01-09 DIAGNOSIS — Z96652 Presence of left artificial knee joint: Secondary | ICD-10-CM | POA: Diagnosis not present

## 2015-01-11 DIAGNOSIS — Z471 Aftercare following joint replacement surgery: Secondary | ICD-10-CM | POA: Diagnosis not present

## 2015-01-11 DIAGNOSIS — M549 Dorsalgia, unspecified: Secondary | ICD-10-CM | POA: Diagnosis not present

## 2015-01-11 DIAGNOSIS — G629 Polyneuropathy, unspecified: Secondary | ICD-10-CM | POA: Diagnosis not present

## 2015-01-11 DIAGNOSIS — Z96652 Presence of left artificial knee joint: Secondary | ICD-10-CM | POA: Diagnosis not present

## 2015-01-11 DIAGNOSIS — M1990 Unspecified osteoarthritis, unspecified site: Secondary | ICD-10-CM | POA: Diagnosis not present

## 2015-01-11 DIAGNOSIS — S76112D Strain of left quadriceps muscle, fascia and tendon, subsequent encounter: Secondary | ICD-10-CM | POA: Diagnosis not present

## 2015-01-14 DIAGNOSIS — M549 Dorsalgia, unspecified: Secondary | ICD-10-CM | POA: Diagnosis not present

## 2015-01-14 DIAGNOSIS — Z96652 Presence of left artificial knee joint: Secondary | ICD-10-CM | POA: Diagnosis not present

## 2015-01-14 DIAGNOSIS — Z471 Aftercare following joint replacement surgery: Secondary | ICD-10-CM | POA: Diagnosis not present

## 2015-01-14 DIAGNOSIS — M1990 Unspecified osteoarthritis, unspecified site: Secondary | ICD-10-CM | POA: Diagnosis not present

## 2015-01-14 DIAGNOSIS — G629 Polyneuropathy, unspecified: Secondary | ICD-10-CM | POA: Diagnosis not present

## 2015-01-14 DIAGNOSIS — S76112D Strain of left quadriceps muscle, fascia and tendon, subsequent encounter: Secondary | ICD-10-CM | POA: Diagnosis not present

## 2015-01-15 DIAGNOSIS — Z96652 Presence of left artificial knee joint: Secondary | ICD-10-CM | POA: Diagnosis not present

## 2015-01-15 DIAGNOSIS — Z471 Aftercare following joint replacement surgery: Secondary | ICD-10-CM | POA: Diagnosis not present

## 2015-01-16 ENCOUNTER — Encounter: Payer: Medicare Other | Admitting: Physical Therapy

## 2015-01-17 DIAGNOSIS — M549 Dorsalgia, unspecified: Secondary | ICD-10-CM | POA: Diagnosis not present

## 2015-01-17 DIAGNOSIS — G629 Polyneuropathy, unspecified: Secondary | ICD-10-CM | POA: Diagnosis not present

## 2015-01-17 DIAGNOSIS — S76112D Strain of left quadriceps muscle, fascia and tendon, subsequent encounter: Secondary | ICD-10-CM | POA: Diagnosis not present

## 2015-01-17 DIAGNOSIS — Z96652 Presence of left artificial knee joint: Secondary | ICD-10-CM | POA: Diagnosis not present

## 2015-01-17 DIAGNOSIS — Z471 Aftercare following joint replacement surgery: Secondary | ICD-10-CM | POA: Diagnosis not present

## 2015-01-17 DIAGNOSIS — M1990 Unspecified osteoarthritis, unspecified site: Secondary | ICD-10-CM | POA: Diagnosis not present

## 2015-01-18 ENCOUNTER — Encounter: Payer: Medicare Other | Admitting: Physical Therapy

## 2015-01-21 DIAGNOSIS — M549 Dorsalgia, unspecified: Secondary | ICD-10-CM | POA: Diagnosis not present

## 2015-01-21 DIAGNOSIS — M1990 Unspecified osteoarthritis, unspecified site: Secondary | ICD-10-CM | POA: Diagnosis not present

## 2015-01-21 DIAGNOSIS — Z96652 Presence of left artificial knee joint: Secondary | ICD-10-CM | POA: Diagnosis not present

## 2015-01-21 DIAGNOSIS — Z471 Aftercare following joint replacement surgery: Secondary | ICD-10-CM | POA: Diagnosis not present

## 2015-01-21 DIAGNOSIS — G629 Polyneuropathy, unspecified: Secondary | ICD-10-CM | POA: Diagnosis not present

## 2015-01-21 DIAGNOSIS — S76112D Strain of left quadriceps muscle, fascia and tendon, subsequent encounter: Secondary | ICD-10-CM | POA: Diagnosis not present

## 2015-01-22 ENCOUNTER — Encounter: Payer: Medicare Other | Admitting: Physical Therapy

## 2015-01-22 DIAGNOSIS — Z471 Aftercare following joint replacement surgery: Secondary | ICD-10-CM | POA: Diagnosis not present

## 2015-01-22 DIAGNOSIS — Z96652 Presence of left artificial knee joint: Secondary | ICD-10-CM | POA: Diagnosis not present

## 2015-01-24 ENCOUNTER — Encounter: Payer: Medicare Other | Admitting: Physical Therapy

## 2015-01-24 DIAGNOSIS — Z96652 Presence of left artificial knee joint: Secondary | ICD-10-CM | POA: Diagnosis not present

## 2015-01-24 DIAGNOSIS — G629 Polyneuropathy, unspecified: Secondary | ICD-10-CM | POA: Diagnosis not present

## 2015-01-24 DIAGNOSIS — M549 Dorsalgia, unspecified: Secondary | ICD-10-CM | POA: Diagnosis not present

## 2015-01-24 DIAGNOSIS — S76112D Strain of left quadriceps muscle, fascia and tendon, subsequent encounter: Secondary | ICD-10-CM | POA: Diagnosis not present

## 2015-01-24 DIAGNOSIS — M1990 Unspecified osteoarthritis, unspecified site: Secondary | ICD-10-CM | POA: Diagnosis not present

## 2015-01-24 DIAGNOSIS — Z471 Aftercare following joint replacement surgery: Secondary | ICD-10-CM | POA: Diagnosis not present

## 2015-01-29 ENCOUNTER — Encounter: Payer: Medicare Other | Admitting: Physical Therapy

## 2015-01-29 DIAGNOSIS — Z471 Aftercare following joint replacement surgery: Secondary | ICD-10-CM | POA: Diagnosis not present

## 2015-01-29 DIAGNOSIS — M1711 Unilateral primary osteoarthritis, right knee: Secondary | ICD-10-CM | POA: Diagnosis not present

## 2015-01-29 DIAGNOSIS — Z96652 Presence of left artificial knee joint: Secondary | ICD-10-CM | POA: Diagnosis not present

## 2015-02-01 ENCOUNTER — Encounter: Payer: Medicare Other | Admitting: Physical Therapy

## 2015-02-08 ENCOUNTER — Encounter: Payer: Medicare Other | Admitting: Physical Therapy

## 2015-02-11 DIAGNOSIS — Z23 Encounter for immunization: Secondary | ICD-10-CM | POA: Diagnosis not present

## 2015-02-11 DIAGNOSIS — R490 Dysphonia: Secondary | ICD-10-CM | POA: Diagnosis not present

## 2015-02-11 DIAGNOSIS — E8881 Metabolic syndrome: Secondary | ICD-10-CM | POA: Diagnosis not present

## 2015-02-11 DIAGNOSIS — D649 Anemia, unspecified: Secondary | ICD-10-CM | POA: Diagnosis not present

## 2015-02-11 DIAGNOSIS — R5383 Other fatigue: Secondary | ICD-10-CM | POA: Diagnosis not present

## 2015-02-11 DIAGNOSIS — Z79899 Other long term (current) drug therapy: Secondary | ICD-10-CM | POA: Diagnosis not present

## 2015-02-11 DIAGNOSIS — R7309 Other abnormal glucose: Secondary | ICD-10-CM | POA: Diagnosis not present

## 2015-02-12 DIAGNOSIS — Z471 Aftercare following joint replacement surgery: Secondary | ICD-10-CM | POA: Diagnosis not present

## 2015-02-12 DIAGNOSIS — Z96652 Presence of left artificial knee joint: Secondary | ICD-10-CM | POA: Diagnosis not present

## 2015-02-12 DIAGNOSIS — M1712 Unilateral primary osteoarthritis, left knee: Secondary | ICD-10-CM | POA: Diagnosis not present

## 2015-02-26 DIAGNOSIS — Z96652 Presence of left artificial knee joint: Secondary | ICD-10-CM | POA: Diagnosis not present

## 2015-02-26 DIAGNOSIS — Z471 Aftercare following joint replacement surgery: Secondary | ICD-10-CM | POA: Diagnosis not present

## 2015-02-28 DIAGNOSIS — J383 Other diseases of vocal cords: Secondary | ICD-10-CM | POA: Diagnosis not present

## 2015-02-28 DIAGNOSIS — R490 Dysphonia: Secondary | ICD-10-CM | POA: Diagnosis not present

## 2015-02-28 DIAGNOSIS — R1310 Dysphagia, unspecified: Secondary | ICD-10-CM | POA: Diagnosis not present

## 2015-03-04 ENCOUNTER — Ambulatory Visit: Payer: Medicare Other | Attending: Orthopedic Surgery

## 2015-03-04 DIAGNOSIS — M25662 Stiffness of left knee, not elsewhere classified: Secondary | ICD-10-CM | POA: Insufficient documentation

## 2015-03-04 DIAGNOSIS — R531 Weakness: Secondary | ICD-10-CM | POA: Insufficient documentation

## 2015-03-04 DIAGNOSIS — M7989 Other specified soft tissue disorders: Secondary | ICD-10-CM | POA: Diagnosis not present

## 2015-03-04 DIAGNOSIS — M25562 Pain in left knee: Secondary | ICD-10-CM | POA: Diagnosis not present

## 2015-03-04 DIAGNOSIS — R269 Unspecified abnormalities of gait and mobility: Secondary | ICD-10-CM | POA: Diagnosis not present

## 2015-03-04 NOTE — Therapy (Addendum)
[facility] [address] [place], [place], [address] Phone: [phone]   Fax:  [phone]  Physical Therapy Evaluation  Patient Details  Name: Lauren English MRN: [id number] Date of Birth: 10-30-1940 Referring Provider: Ollen Gross, MD  Encounter Date: 03/04/2015      PT End of Session - 03/04/15 1216    Visit Number 1   Number of Visits 10   Date for PT Re-Evaluation 04/15/15   PT Start Time 1146   PT Stop Time 1235   PT Time Calculation (min) 49 min   Activity Tolerance Patient tolerated treatment well   Behavior During Therapy [facility] for tasks assessed/performed      Past Medical History  Diagnosis Date  . PONV (postoperative nausea and vomiting)   . Bladder incontinence   . OSA on CPAP   . Arthritis     "qwhere; feet, knees, neck" (04/21/2012)  . Chronic back pain   . Closed angle glaucoma     "both eyes" (04/21/2012)  . Prediabetes   . Urinary tract bacterial infections   . Overactive bladder   . Numbness     fingertips bilat and feet bilat   . Peripheral neuropathy (HCC)     feet bilat     Past Surgical History  Procedure Laterality Date  . Back surgery    . Knee arthroplasty  06/2008    "slipped on ice; torn ligaments & cartilage" (04/21/2012)  . Posterior lumbar fusion  04/21/2012  . Tonsillectomy and adenoidectomy  ~ 1950  . Abdominal hysterectomy  1980    "partial" (04/21/2012)  . Bladder suspension  1990  . Anterior fusion cervical spine  12/2005  . Tubal ligation  1970's  . Dilation and curettage of uterus  1980  . Appendectomy    . 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 Patellar tendon repair Left 12/10/2014    Procedure: REPAIR PARTIAL QUAD TENDON TEAR;  Surgeon: Ollen Gross, MD;  Location: [facility];  Service: Orthopedics;  Laterality: Left;     There were no vitals filed for this visit.  Visit Diagnosis:  Stiffness of left knee - Plan: PT plan of care cert/re-cert  Left knee pain - Plan: PT plan of care cert/re-cert  Weakness - Plan: PT plan of care cert/re-cert  Abnormality of gait - Plan: PT plan of care cert/re-cert  Swelling of limb - Plan: PT plan of care cert/re-cert      Subjective Assessment - 03/04/15 1151    Subjective Pt had total knee replacement 7.19.16 and then had quad tendon tear.  Pt had quad tendon repair 12/10/14.  Pt then had hematoma with formation of a wound.  Pt had been treated for wound since this time and knee was in immobilizer.  Pt is now allowed to attend PT as wound is healed enough to resume outpatient PT.  MD would like to see how much ROM progresses over the next month before considering manipulation surgery.     Patient is accompained by: Family member   Limitations Sitting   How long can you sit comfortably? not able to bend knee with sitting   How long can you stand comfortably? 30 minutes   How long can you walk comfortably? pt is using a crutch   Patient Stated Goals bend the knee, walk without crutch  Currently in Pain? Yes   Pain Score 4    Pain Location Knee   Pain Orientation Left   Pain Descriptors / Indicators Aching;Tightness   Pain Onset More than a month ago   Pain Frequency Intermittent   Aggravating Factors  sitting, bending the knee   Pain Relieving Factors rest, ice, not stretching            [facility] PT Assessment - 03/04/15 0001    Assessment   Medical Diagnosis S/P left total knee arthroplasty, wound debridement   Referring Provider Ollen Gross, MD   Onset Date/Surgical Date 12/10/14  quad tendon repair, TKA 11/27/14   Next MD Visit 03/19/15   Prior Therapy home health PT   Precautions   Precautions Fall   Restrictions   Weight Bearing Restrictions No   Balance Screen   Has the patient fallen in the past 6 months No   Has the patient had a decrease  in activity level because of a fear of falling?  No   Is the patient reluctant to leave their home because of a fear of falling?  No   Home Environment   Living Environment Private residence   Living Arrangements Spouse/significant other   Type of Home House   Home Access Stairs to enter   Entrance Stairs-Number of Steps 2   Home Layout Multi-level  3 levels   Alternate Level Stairs-Number of Steps 15   Alternate Level Stairs-Rails Can reach both   Prior Function   Level of Independence Independent with household mobility with device;Independent with basic ADLs   Vocation Retired   Insurance claims handler   Overall Cognitive Status Within Functional Limits for tasks assessed   Observation/Other Assessments   Focus on Therapeutic Outcomes (FOTO)  63% limitation   Observation/Other Assessments-Edema    Edema Circumferential   Circumferential Edema   Circumferential - Right 45 cm   Circumferential - Left  49 cm   ROM / Strength   AROM / PROM / Strength AROM;PROM;Strength   AROM   AROM Assessment Site Knee   Right/Left Knee Left   Left Knee Extension 3   Left Knee Flexion 14   PROM   PROM Assessment Site Knee   Right/Left Knee Left   Left Knee Extension 1   Left Knee Flexion 17  sitting edge of mat   Strength   Overall Strength Deficits   Overall Strength Comments Not able to test Lt knee flexion due to limited AROM, extension 4/5 within available ROM, Lt hip flexion 4+5, abduction 4-/5   Strength Assessment Site Knee;Hip   Palpation   Patella mobility limited Lt patellar mobility in all directions on the Lt   Palpation comment pitting edema in the Lt LE about the knee and ankle, warmth about the Lt knee joint   Ambulation/Gait   Ambulation/Gait Yes   Ambulation/Gait Assistance 6: Modified independent (Device/Increase time)   Assistive device R Axillary Crutch   Gait Pattern Step-to pattern;Decreased hip/knee flexion - left  Lt leg completely straight   Stairs No                    [facility] Adult PT Treatment/Exercise - 03/04/15 0001    Knee/Hip Exercises: Aerobic   Nustep for bending Level 1x 8  seat 13, arms 14   Modalities   Modalities Vasopneumatic   Vasopneumatic   Number Minutes Vasopneumatic  15 minutes   Vasopnuematic Location  Knee   Vasopneumatic Pressure Medium  Vasopneumatic Temperature  3 snowflakes                  PT Short Term Goals - Mar 31, 2015 1220    PT SHORT TERM GOAL #1   Title be independent in initial HEP   Time 3   Period Weeks   Status New   PT SHORT TERM GOAL #2   Title demonstrate Lt knee AROM flexion > or = to 25 degrees   Time 3   Period Weeks   Status New   PT SHORT TERM GOAL #3   Title --   PT SHORT TERM GOAL #4   Title --           PT Long Term Goals - 31-Mar-2015 1146    PT LONG TERM GOAL #1   Title be independent in advanced HEP   Time 6   Period Weeks   Status New   PT LONG TERM GOAL #2   Title improve FOTO to < or = to 48% limitaiton   Time 6   Period Weeks   Status New   PT LONG TERM GOAL #3   Title demonstrate Lt knee AROM flexion to > or = to 45 degrees to improve sitting   Time 6   Period Weeks   Status New   PT LONG TERM GOAL #4   Title demonstrate 4+/5 Lt hip strength to improve gait   Time 6   Period Weeks   Status New   PT LONG TERM GOAL #5   Title --   PT LONG TERM GOAL #6   Title --   PT LONG TERM GOAL #7   Title --               Plan - 03-31-15 1216    Clinical Impression Statement Pt presents to PT s/p Lt TKA (11/24/14)  followed by quad tendon repair (12/10/14) due to injury.  Pt was in immobilizer after surgery and had a significant wound that was being treated.  Pt now with resultant Lt knee sitffness with < 20 degrees of Lt knee flexion.  Pt demonstrates weak hip and knee on the Lt, FOTO score  is 63% limitation and pt has gait abnormality due to stiffness.  Pt wil benefit from skilled PT for edema management, AROM advancement and hip/knee  strengthening.     Pt will benefit from skilled therapeutic intervention in order to improve on the following deficits Abnormal gait;Decreased activity tolerance;Difficulty walking;Increased edema;Impaired flexibility;Decreased strength;Hypomobility;Decreased range of motion;Decreased endurance;Decreased mobility;Decreased scar mobility;Increased muscle spasms;Pain;Increased fascial restricitons   Rehab Potential Excellent   PT Frequency 2x / week   PT Duration 6 weeks   PT Treatment/Interventions ADLs/Self Care Home Management;Cryotherapy;Electrical Stimulation;Gait training;Ultrasound;Moist Heat;Stair training;Functional mobility training;Therapeutic activities;Therapeutic exercise;Neuromuscular re-education;Manual techniques;Patient/family education;Scar mobilization;Passive range of motion;Taping;Vasopneumatic Device   PT Next Visit Plan Lt knee flexion as tolerated, edema management, quad and hip strength on the Lt   Consulted and Agree with Plan of Care Patient          G-Codes - 2015/03/31 1146    Functional Assessment Tool Used FOTO: 63% limitation   Functional Limitation Mobility: Walking and moving around   Mobility: Walking and Moving Around Current Status (Z6109) At least 60 percent but less than 80 percent impaired, limited or restricted   Mobility: Walking and Moving Around Goal Status (U0454) At least 40 percent but less than 60 percent impaired, limited or restricted       Problem List Patient Active  Problem List   Diagnosis Date Noted  . Quadriceps tendon rupture 12/09/2014  . OA (osteoarthritis) of knee 11/26/2014    Rocky Gladden, PT 03/04/2015, 1:03 PM  [facility] [address] [place], [place], [address] Phone: [phone]   Fax:  [phone]  Name: Fonda KinderJanet G Dombrosky MRN: [id number] Date of Birth: 1940/09/14

## 2015-03-07 ENCOUNTER — Ambulatory Visit: Payer: Medicare Other

## 2015-03-11 ENCOUNTER — Ambulatory Visit: Payer: Medicare Other

## 2015-03-11 DIAGNOSIS — R531 Weakness: Secondary | ICD-10-CM

## 2015-03-11 DIAGNOSIS — R269 Unspecified abnormalities of gait and mobility: Secondary | ICD-10-CM | POA: Diagnosis not present

## 2015-03-11 DIAGNOSIS — M25662 Stiffness of left knee, not elsewhere classified: Secondary | ICD-10-CM

## 2015-03-11 DIAGNOSIS — M7989 Other specified soft tissue disorders: Secondary | ICD-10-CM | POA: Diagnosis not present

## 2015-03-11 DIAGNOSIS — M25562 Pain in left knee: Secondary | ICD-10-CM | POA: Diagnosis not present

## 2015-03-11 NOTE — Therapy (Signed)
[facility] [address] [place], [place], [address] Phone: [phone]   Fax:  [phone]  Physical Therapy Treatment  Patient Details  Name: Lauren English MRN: [id number] Date of Birth: 30-Apr-1941 Referring Provider: Ollen Gross, MD  Encounter Date: 03/11/2015      PT End of Session - 03/11/15 0921    Visit Number 2   Number of Visits 10   Date for PT Re-Evaluation 04/15/15   PT Start Time 0840   PT Stop Time 0935   PT Time Calculation (min) 55 min   Activity Tolerance Patient tolerated treatment well   Behavior During Therapy [facility] for tasks assessed/performed      Past Medical History  Diagnosis Date  . PONV (postoperative nausea and vomiting)   . Bladder incontinence   . OSA on CPAP   . Arthritis     "qwhere; feet, knees, neck" (04/21/2012)  . Chronic back pain   . Closed angle glaucoma     "both eyes" (04/21/2012)  . Prediabetes   . Urinary tract bacterial infections   . Overactive bladder   . Numbness     fingertips bilat and feet bilat   . Peripheral neuropathy (HCC)     feet bilat     Past Surgical History  Procedure Laterality Date  . Back surgery    . Knee arthroplasty  06/2008    "slipped on ice; torn ligaments & cartilage" (04/21/2012)  . Posterior lumbar fusion  04/21/2012  . Tonsillectomy and adenoidectomy  ~ 1950  . Abdominal hysterectomy  1980    "partial" (04/21/2012)  . Bladder suspension  1990  . Anterior fusion cervical spine  12/2005  . Tubal ligation  1970's  . Dilation and curettage of uterus  1980  . Appendectomy    . 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 Patellar tendon repair Left 12/10/2014    Procedure: REPAIR PARTIAL QUAD TENDON TEAR;  Surgeon: Ollen Gross, MD;  Location: [facility];  Service: Orthopedics;  Laterality: Left;     There were no vitals filed for this visit.  Visit Diagnosis:  Stiffness of left knee  Left knee pain  Weakness  Abnormality of gait  Swelling of limb      Subjective Assessment - 03/11/15 0839    Subjective Pt reports that knee continues to be really stiff.  "It isn't bending".     Currently in Pain? Yes   Pain Score 1    Pain Location Knee   Pain Orientation Left   Pain Descriptors / Indicators Aching;Tightness   Pain Type Surgical pain   Pain Onset More than a month ago   Pain Frequency Intermittent   Aggravating Factors  sitting, bending the knee   Pain Relieving Factors rest, ice, not streching                         [facility] Adult PT Treatment/Exercise - 03/11/15 0001    Exercises   Exercises Knee/Hip   Knee/Hip Exercises: Aerobic   Nustep for bending Level 1x 8  seat 13, arms 14   Knee/Hip Exercises: Supine   Heel Slides Left;AAROM;1 set;10 reps  with strap and assist by PT   Straight Leg Raises Strengthening;Left;2 sets;10 reps   Knee/Hip Exercises: Sidelying   Hip ABduction Strengthening;Left;2 sets;10 reps  Knee/Hip Exercises: Prone   Hip Extension Strengthening;Left;2 sets;10 reps   Modalities   Modalities Vasopneumatic   Vasopneumatic   Number Minutes Vasopneumatic  15 minutes   Vasopnuematic Location  Knee   Vasopneumatic Pressure Medium   Vasopneumatic Temperature  3 snowflakes   Manual Therapy   Manual Therapy Joint mobilization;Passive ROM;Myofascial release   Manual therapy comments PROM into flexion within available ROM, soft tissue mobilization to quads and hamstrings                  PT Short Term Goals - 03/11/15 0847    PT SHORT TERM GOAL #1   Title be independent in initial HEP   Time 3   Period Weeks   Status On-going   PT SHORT TERM GOAL #2   Title demonstrate Lt knee AROM flexion > or = to 25 degrees   Time 3   Period Weeks   Status On-going           PT Long Term Goals - 03/04/15 1146     PT LONG TERM GOAL #1   Title be independent in advanced HEP   Time 6   Period Weeks   Status New   PT LONG TERM GOAL #2   Title improve FOTO to < or = to 48% limitaiton   Time 6   Period Weeks   Status New   PT LONG TERM GOAL #3   Title demonstrate Lt knee AROM flexion to > or = to 45 degrees to improve sitting   Time 6   Period Weeks   Status New   PT LONG TERM GOAL #4   Title demonstrate 4+/5 Lt hip strength to improve gait   Time 6   Period Weeks   Status New   PT LONG TERM GOAL #5   Title --   PT LONG TERM GOAL #6   Title --   PT LONG TERM GOAL #7   Title --               Plan - 03/11/15 0847    Clinical Impression Statement Pt with only 1 session after evaluation.  Pt demonstrates significant Lt knee AROM deficits into flexion secondary to immobilization after knee replacement and quad tendon tear with repair.  No significant progress toward goals since evaluation.  Pt will benefit from skilled PT for edema management, AROM advancement and hip/knee strengthening.   Pt will benefit from skilled therapeutic intervention in order to improve on the following deficits Abnormal gait;Decreased activity tolerance;Difficulty walking;Increased edema;Impaired flexibility;Decreased strength;Hypomobility;Decreased range of motion;Decreased endurance;Decreased mobility;Decreased scar mobility;Increased muscle spasms;Pain;Increased fascial restricitons   Rehab Potential Excellent   PT Frequency 2x / week   PT Duration 6 weeks   PT Treatment/Interventions ADLs/Self Care Home Management;Cryotherapy;Electrical Stimulation;Gait training;Ultrasound;Moist Heat;Stair training;Functional mobility training;Therapeutic activities;Therapeutic exercise;Neuromuscular re-education;Manual techniques;Patient/family education;Scar mobilization;Passive range of motion;Taping;Vasopneumatic Device   PT Next Visit Plan Lt knee flexion as tolerated, edema management, quad and hip strength on the Lt         Problem List Patient Active Problem List   Diagnosis Date Noted  . Quadriceps tendon rupture 12/09/2014  . OA (osteoarthritis) of knee 11/26/2014    Miarose Lippert, PT 03/11/2015, 9:22 AM  [facility] [address] [place], [place], [address] Phone: [phone]   Fax:  [phone]  Name: Lauren English MRN: [id number] Date of Birth: [birth year]

## 2015-03-13 ENCOUNTER — Ambulatory Visit: Payer: Medicare Other | Attending: Orthopedic Surgery

## 2015-03-13 DIAGNOSIS — R269 Unspecified abnormalities of gait and mobility: Secondary | ICD-10-CM

## 2015-03-13 DIAGNOSIS — M25662 Stiffness of left knee, not elsewhere classified: Secondary | ICD-10-CM | POA: Insufficient documentation

## 2015-03-13 DIAGNOSIS — M7989 Other specified soft tissue disorders: Secondary | ICD-10-CM | POA: Diagnosis not present

## 2015-03-13 DIAGNOSIS — R531 Weakness: Secondary | ICD-10-CM | POA: Diagnosis not present

## 2015-03-13 DIAGNOSIS — M25562 Pain in left knee: Secondary | ICD-10-CM | POA: Diagnosis not present

## 2015-03-13 NOTE — Therapy (Signed)
[facility] [address], [address] [place], [place], [address] Phone: [phone]   Fax:  [phone]  Physical Therapy Treatment  Patient Details  Name: Lauren KinderJanet G Meckel MRN: [id number] Date of Birth: Aug 08, 1940 Referring Provider: Ollen GrossAluisio, Frank, MD  Encounter Date: 03/13/2015      PT End of Session - 03/13/15 1125    Visit Number 3   Number of Visits 10   Date for PT Re-Evaluation 04/15/15   PT Start Time 1054   PT Stop Time 1140   PT Time Calculation (min) 46 min   Activity Tolerance Patient tolerated treatment well   Behavior During Therapy [facility] for tasks assessed/performed      Past Medical History  Diagnosis Date  . PONV (postoperative nausea and vomiting)   . Bladder incontinence   . OSA on CPAP   . Arthritis     "qwhere; feet, knees, neck" (04/21/2012)  . Chronic back pain   . Closed angle glaucoma     "both eyes" (04/21/2012)  . Prediabetes   . Urinary tract bacterial infections   . Overactive bladder   . Numbness     fingertips bilat and feet bilat   . Peripheral neuropathy (HCC)     feet bilat     Past Surgical History  Procedure Laterality Date  . Back surgery    . Knee arthroplasty  06/2008    "slipped on ice; torn ligaments & cartilage" (04/21/2012)  . Posterior lumbar fusion  04/21/2012  . Tonsillectomy and adenoidectomy  ~ 1950  . Abdominal hysterectomy  1980    "partial" (04/21/2012)  . Bladder suspension  1990  . Anterior fusion cervical spine  12/2005  . Tubal ligation  1970's  . Dilation and curettage of uterus  1980  . Appendectomy    . 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 Patellar tendon repair Left 12/10/2014    Procedure: REPAIR PARTIAL QUAD TENDON TEAR;  Surgeon: Ollen GrossFrank Aluisio, MD;  Location: [facility];  Service: Orthopedics;  Laterality: Left;     There were no vitals filed for this visit.  Visit Diagnosis:  Stiffness of left knee  Left knee pain  Weakness  Abnormality of gait  Swelling of limb      Subjective Assessment - 03/13/15 1101    Subjective Scar wound is healing, almost healed now.  Will see MD 03/19/15 to discuss lack of AROM.   Currently in Pain? Yes   Pain Score 4    Pain Location Knee   Pain Orientation Left   Pain Descriptors / Indicators Aching;Tightness   Pain Type Surgical pain   Pain Onset More than a month ago   Pain Frequency Intermittent                         [facility] Adult PT Treatment/Exercise - 03/13/15 0001    Knee/Hip Exercises: Stretches   LobbyistQuad Stretch Other (comment)  10 seconds x 20 reps   Other Knee/Hip Stretches prolonged stretch over black bolster x 5 minutes   Modalities   Modalities Vasopneumatic   Vasopneumatic   Number Minutes Vasopneumatic  15 minutes   Vasopnuematic Location  Knee   Vasopneumatic Pressure Medium   Vasopneumatic Temperature  3 snowflakes   Manual Therapy   Manual Therapy Joint mobilization;Passive ROM;Myofascial release  Manual therapy comments PROM into flexion within available ROM, soft tissue mobilization to quads and hamstrings  performed in sitting at edge of mat table                  PT Short Term Goals - 03/11/15 0847    PT SHORT TERM GOAL #1   Title be independent in initial HEP   Time 3   Period Weeks   Status On-going   PT SHORT TERM GOAL #2   Title demonstrate Lt knee AROM flexion > or = to 25 degrees   Time 3   Period Weeks   Status On-going           PT Long Term Goals - 03/04/15 1146    PT LONG TERM GOAL #1   Title be independent in advanced HEP   Time 6   Period Weeks   Status New   PT LONG TERM GOAL #2   Title improve FOTO to < or = to 48% limitaiton   Time 6   Period Weeks   Status New   PT LONG TERM GOAL #3   Title demonstrate Lt knee AROM flexion to > or = to 45 degrees to improve  sitting   Time 6   Period Weeks   Status New   PT LONG TERM GOAL #4   Title demonstrate 4+/5 Lt hip strength to improve gait   Time 6   Period Weeks   Status New   PT LONG TERM GOAL #5   Title --   PT LONG TERM GOAL #6   Title --   PT LONG TERM GOAL #7   Title --               Plan - 03/13/15 1102    Clinical Impression Statement Pt with continued Lt knee AROM limitations with < 20 degrees of flexion.  Pt needs to substitue with sitting and negotiating steps due to limited AROM.  Pt with incresaed edema today and tension at distal quad and hamstrings. Pt will see MD next week to discuss limited AROM.     Pt will benefit from skilled therapeutic intervention in order to improve on the following deficits Abnormal gait;Decreased activity tolerance;Difficulty walking;Increased edema;Impaired flexibility;Decreased strength;Hypomobility;Decreased range of motion;Decreased endurance;Decreased mobility;Decreased scar mobility;Increased muscle spasms;Pain;Increased fascial restricitons   Rehab Potential Excellent   PT Frequency 2x / week   PT Duration 6 weeks   PT Treatment/Interventions ADLs/Self Care Home Management;Cryotherapy;Electrical Stimulation;Gait training;Ultrasound;Moist Heat;Stair training;Functional mobility training;Therapeutic activities;Therapeutic exercise;Neuromuscular re-education;Manual techniques;Patient/family education;Scar mobilization;Passive range of motion;Taping;Vasopneumatic Device   PT Next Visit Plan Lt knee flexion as tolerated, edema management, quad and hip strength on the Lt.  Route note to MD next session (03/18/15), take measures   Consulted and Agree with Plan of Care Patient        Problem List Patient Active Problem List   Diagnosis Date Noted  . Quadriceps tendon rupture 12/09/2014  . OA (osteoarthritis) of knee 11/26/2014    Lauren English, PT 03/13/2015, 11:33 AM  [facility] [address], [address] [place], [place], [address] Phone: [phone]   Fax:  [phone]  Name: Lauren English MRN: [id number] Date of Birth: June 05, 1940

## 2015-03-14 ENCOUNTER — Encounter: Payer: Medicare Other | Admitting: Physical Therapy

## 2015-03-18 ENCOUNTER — Ambulatory Visit: Payer: Medicare Other

## 2015-03-18 DIAGNOSIS — R531 Weakness: Secondary | ICD-10-CM | POA: Diagnosis not present

## 2015-03-18 DIAGNOSIS — M25562 Pain in left knee: Secondary | ICD-10-CM

## 2015-03-18 DIAGNOSIS — M25662 Stiffness of left knee, not elsewhere classified: Secondary | ICD-10-CM | POA: Diagnosis not present

## 2015-03-18 DIAGNOSIS — M7989 Other specified soft tissue disorders: Secondary | ICD-10-CM | POA: Diagnosis not present

## 2015-03-18 DIAGNOSIS — R269 Unspecified abnormalities of gait and mobility: Secondary | ICD-10-CM | POA: Diagnosis not present

## 2015-03-18 NOTE — Therapy (Signed)
[facility] [address] [place], [place], [address] Phone: [phone]   Fax:  [phone]  Physical Therapy Treatment  Patient Details  Name: Lauren English MRN: [id number] Date of Birth: [birth year] Referring Provider: Ollen Gross, MD  Encounter Date: 03/18/2015      PT End of Session - 03/18/15 0922    Visit Number 4   Number of Visits 10   Date for PT Re-Evaluation 04/15/15   PT Start Time 0846   PT Stop Time 0935   PT Time Calculation (min) 49 min   Activity Tolerance Patient tolerated treatment well   Behavior During Therapy [facility] for tasks assessed/performed      Past Medical History  Diagnosis Date  . PONV (postoperative nausea and vomiting)   . Bladder incontinence   . OSA on CPAP   . Arthritis     "qwhere; feet, knees, neck" (04/21/2012)  . Chronic back pain   . Closed angle glaucoma     "both eyes" (04/21/2012)  . Prediabetes   . Urinary tract bacterial infections   . Overactive bladder   . Numbness     fingertips bilat and feet bilat   . Peripheral neuropathy (HCC)     feet bilat     Past Surgical History  Procedure Laterality Date  . Back surgery    . Knee arthroplasty  06/2008    "slipped on ice; torn ligaments & cartilage" (04/21/2012)  . Posterior lumbar fusion  04/21/2012  . Tonsillectomy and adenoidectomy  ~ 1950  . Abdominal hysterectomy  1980    "partial" (04/21/2012)  . Bladder suspension  1990  . Anterior fusion cervical spine  12/2005  . Tubal ligation  1970's  . Dilation and curettage of uterus  1980  . Appendectomy    . 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 Patellar tendon repair Left 12/10/2014    Procedure: REPAIR PARTIAL QUAD TENDON TEAR;  Surgeon: Ollen Gross, MD;  Location: [facility];  Service: Orthopedics;  Laterality: Left;     There were no vitals filed for this visit.  Visit Diagnosis:  Stiffness of left knee  Left knee pain  Weakness  Abnormality of gait  Swelling of limb      Subjective Assessment - 03/18/15 0852    Subjective Knee feels about the same.  No change in ROM, a lot of edema.  Scar is healed now, no longer using a band-aid   Currently in Pain? Yes   Pain Score 0-No pain   Pain Location Knee   Pain Orientation Left   Pain Descriptors / Indicators Aching;Shooting   Pain Type Surgical pain   Aggravating Factors  pain wiht bending knee pain increases to 7-8/10    Pain Relieving Factors rest, ice, not bending the knee            [facility] PT Assessment - 03/18/15 0001    Assessment   Medical Diagnosis S/P left total knee arthroplasty, wound debridement   Onset Date/Surgical Date 12/10/14  quad tendon repair, TKA 11/27/14   Precautions   Precautions Fall   Balance Screen   Has the patient fallen in the past 6 months No   Has the patient had a decrease in activity level because of a fear of falling?  No   Is the patient reluctant to leave  their home because of a fear of falling?  No   Observation/Other Assessments-Edema    Edema Circumferential   Circumferential Edema   Circumferential - Left  51 cm   AROM   AROM Assessment Site Knee   Right/Left Knee Left   PROM   PROM Assessment Site Knee   Right/Left Knee Left   Left Knee Extension 0   Left Knee Flexion 16                     [facility] Adult PT Treatment/Exercise - 03/18/15 0001    Knee/Hip Exercises: Stretches   Lobbyist Other (comment)  10 seconds x 20 reps   Modalities   Modalities Vasopneumatic   Vasopneumatic   Number Minutes Vasopneumatic  15 minutes   Vasopnuematic Location  Knee   Vasopneumatic Pressure Medium   Vasopneumatic Temperature  3 snowflakes   Manual Therapy   Manual Therapy Joint mobilization   Manual therapy comments PROM into flexion within available ROM, soft tissue  mobilization to quads and hamstrings  performed in sitting at edge of mat table                  PT Short Term Goals - 03/18/15 0853    PT SHORT TERM GOAL #1   Title be independent in initial HEP   Status Achieved   PT SHORT TERM GOAL #2   Title demonstrate Lt knee AROM flexion > or = to 25 degrees   Time 3   Period Weeks   Status On-going  16 degrees           PT Long Term Goals - 03/04/15 1146    PT LONG TERM GOAL #1   Title be independent in advanced HEP   Time 6   Period Weeks   Status New   PT LONG TERM GOAL #2   Title improve FOTO to < or = to 48% limitaiton   Time 6   Period Weeks   Status New   PT LONG TERM GOAL #3   Title demonstrate Lt knee AROM flexion to > or = to 45 degrees to improve sitting   Time 6   Period Weeks   Status New   PT LONG TERM GOAL #4   Title demonstrate 4+/5 Lt hip strength to improve gait   Time 6   Period Weeks   Status New   PT LONG TERM GOAL #5   Title --   PT LONG TERM GOAL #6   Title --   PT LONG TERM GOAL #7   Title --               Plan - 03/18/15 0920    Clinical Impression Statement PT with continued Lt knee ROM limitation.  PROM 0-16 degrees of flexion.  Pt with 51 cm edema at midpatella and pitting edema in the Lt LE down to the ankle.  Pt will see MD tomorrow to discuss lack of progress.    Pt will benefit from skilled therapeutic intervention in order to improve on the following deficits Abnormal gait;Decreased activity tolerance;Difficulty walking;Increased edema;Impaired flexibility;Decreased strength;Hypomobility;Decreased range of motion;Decreased endurance;Decreased mobility;Decreased scar mobility;Increased muscle spasms;Pain;Increased fascial restricitons   Rehab Potential Excellent   PT Frequency 2x / week   PT Duration 6 weeks   PT Treatment/Interventions ADLs/Self Care Home Management;Cryotherapy;Electrical Stimulation;Gait training;Ultrasound;Moist Heat;Stair training;Functional mobility  training;Therapeutic activities;Therapeutic exercise;Neuromuscular re-education;Manual techniques;Patient/family education;Scar mobilization;Passive range of motion;Taping;Vasopneumatic Device   PT Next Visit Plan Lt  knee flexion as tolerated, edema management, quad and hip strength on the Lt.  See what MD says   Consulted and Agree with Plan of Care Patient        Problem List Patient Active Problem List   Diagnosis Date Noted  . Quadriceps tendon rupture 12/09/2014  . OA (osteoarthritis) of knee 11/26/2014    TAKACS,KELLY, PT 03/18/2015, 9:23 AM  [facility] [address], [address] [place], [place], [address] Phone: [phone]   Fax:  [phone]  Name: Fonda KinderJanet G Kneisel MRN: [id number] Date of Birth: 01/22/1941

## 2015-03-19 DIAGNOSIS — Z96652 Presence of left artificial knee joint: Secondary | ICD-10-CM | POA: Diagnosis not present

## 2015-03-19 DIAGNOSIS — Z471 Aftercare following joint replacement surgery: Secondary | ICD-10-CM | POA: Diagnosis not present

## 2015-03-20 ENCOUNTER — Encounter: Payer: Medicare Other | Admitting: Physical Therapy

## 2015-03-21 ENCOUNTER — Ambulatory Visit: Payer: Self-pay | Admitting: Orthopedic Surgery

## 2015-03-21 NOTE — Progress Notes (Signed)
Preoperative surgical orders have been place into the Epic hospital system for Fonda KinderJanet G Mariani on 03/21/2015, 12:06 PM  by Patrica DuelPERKINS, Lorrayne Ismael for surgery on 03-25-2015.  Preop Knee orders including IV Tylenol and IV Decadron as long as there are no contraindications to the above medications. Avel Peacerew Yaroslav Gombos, PA-C

## 2015-03-21 NOTE — Patient Instructions (Addendum)
Lauren English  03/22/2015   Your procedure is scheduled on: March 25, 2015  [facility]  [facility] to 3rd floor to  Short Stay Center at 3:00 PM.  Call this number if you have problems the morning of surgery [phone]   Remember: ONLY 1 PERSON MAY GO WITH YOU TO SHORT STAY TO GET  READY MORNING OF YOUR SURGERY.  Do not eat food after midnight.  May follow clear liquid diet Monday AM until 10:30 AM.   Then nothing by mouth.    Take these medicines the morning of surgery with A SIP OF WATER: Solifenacin DO NOT TAKE ANY DIABETIC MEDICATIONS DAY OF YOUR SURGERY                               You may not have any metal on your body including hair pins and              piercings  Do not wear jewelry, make-up, lotions, powders or perfumes, deodorant             Do not wear nail polish.  Do not shave  48 hours prior to surgery. .   Do not bring valuables to the hospital. [facility] IS NOT             RESPONSIBLE   FOR VALUABLES.  Contacts, dentures or bridgework may not be worn into surgery.              Bring in mask and tubing from c-pap machine day of surgery.      Patients discharged the day of surgery will not be allowed to drive home.  Name and phone number of your driver:  Special Instructions: coughing and deep breathing exercises, leg exercises              Please read over the following fact sheets you were given: _____________________________________________________________________             [facility] Before surgery, you can play an important role.  Because skin is not sterile, your skin needs to be as free of germs as possible.  You can reduce the number of germs on your skin by washing with CHG (chlorahexidine gluconate) soap before surgery.  CHG is an antiseptic cleaner which kills germs and bonds with the skin to continue killing germs even after washing. Please DO NOT use if  you have an allergy to CHG or antibacterial soaps.  If your skin becomes reddened/irritated stop using the CHG and inform your nurse when you arrive at Short Stay. Do not shave (including legs and underarms) for at least 48 hours prior to the first CHG shower.  You may shave your face/neck. Please follow these instructions carefully:  1.  Shower with CHG Soap the night before surgery and the  morning of Surgery.  2.  If you choose to wash your hair, wash your hair first as usual with your  normal  shampoo.  3.  After you shampoo, rinse your hair and body thoroughly to remove the  shampoo.                           4.  Use CHG as you would any other liquid soap.  You can apply chg directly  to the skin and wash                       Gently with a scrungie or clean washcloth.  5.  Apply the CHG Soap to your body ONLY FROM THE NECK DOWN.   Do not use on face/ open                           Wound or open sores. Avoid contact with eyes, ears mouth and genitals (private parts).                       Wash face,  Genitals (private parts) with your normal soap.             6.  Wash thoroughly, paying special attention to the area where your surgery  will be performed.  7.  Thoroughly rinse your body with warm water from the neck down.  8.  DO NOT shower/wash with your normal soap after using and rinsing off  the CHG Soap.                9.  Pat yourself dry with a clean towel.            10.  Wear clean pajamas.            11.  Place clean sheets on your bed the night of your first shower and do not  sleep with pets. Day of Surgery : Do not apply any lotions/deodorants the morning of surgery.  Please wear clean clothes to the hospital/surgery center.  FAILURE TO FOLLOW THESE INSTRUCTIONS MAY RESULT IN THE CANCELLATION OF YOUR SURGERY PATIENT SIGNATURE_________________________________  NURSE  SIGNATURE__________________________________  ________________________________________________________________________   [name]  An incentive spirometer is a tool that can help keep your lungs clear and active. This tool measures how well you are filling your lungs with each breath. Taking long deep breaths may help reverse or decrease the chance of developing breathing (pulmonary) problems (especially infection) following:  A long period of time when you are unable to move or be active. BEFORE THE PROCEDURE   If the spirometer includes an indicator to show your best effort, your nurse or respiratory therapist will set it to a desired goal.  If possible, sit up straight or lean slightly forward. Try not to slouch.  Hold the incentive spirometer in an upright position. INSTRUCTIONS FOR USE  1. Sit on the edge of your bed if possible, or sit up as far as you can in bed or on a chair. 2. Hold the incentive spirometer in an upright position. 3. Breathe out normally. 4. Place the mouthpiece in your mouth and seal your lips tightly around it. 5. Breathe in slowly and as deeply as possible, raising the piston or the ball toward the top of the column. 6. Hold your breath for 3-5 seconds or for as long as possible. Allow the piston or ball to fall to the bottom of the column. 7. Remove the mouthpiece from your mouth and breathe out normally. 8. Rest for a few seconds and repeat Steps 1 through 7 at least 10 times every 1-2 hours when you are awake. Take your time and take a few normal breaths between deep breaths. 9. The spirometer may include an indicator to show your best effort. Use the indicator as a goal to work toward  during each repetition. 10. After each set of 10 deep breaths, practice coughing to be sure your lungs are clear. If you have an incision (the cut made at the time of surgery), support your incision when coughing by placing a pillow or rolled up towels firmly  against it. Once you are able to get out of bed, walk around indoors and cough well. You may stop using the incentive spirometer when instructed by your caregiver.  RISKS AND COMPLICATIONS  Take your time so you do not get dizzy or light-headed.  If you are in pain, you may need to take or ask for pain medication before doing incentive spirometry. It is harder to take a deep breath if you are having pain. AFTER USE  Rest and breathe slowly and easily.  It can be helpful to keep track of a log of your progress. Your caregiver can provide you with a simple table to help with this. If you are using the spirometer at home, follow these instructions: [place] IF:   You are having difficultly using the spirometer.  You have trouble using the spirometer as often as instructed.  Your pain medication is not giving enough relief while using the spirometer.  You develop fever of 100.5 F (38.1 C) or higher. SEEK IMMEDIATE MEDICAL CARE IF:   You cough up bloody sputum that had not been present before.  You develop fever of 102 F (38.9 C) or greater.  You develop worsening pain at or near the incision site. MAKE SURE YOU:   Understand these instructions.  Will watch your condition.  Will get help right away if you are not doing well or get worse. Document Released: 09/07/2006 Document Revised: 07/20/2011 Document Reviewed: 11/08/2006 ExitCare Patient Information 2014 ExitCare, [place].   ________________________________________________________________________    CLEAR LIQUID DIET   Foods Allowed                                                                     Foods Excluded  Coffee and tea, regular and decaf                             liquids that you cannot  Plain Jell-O in any flavor                                             see through such as: Fruit ices (not with fruit pulp)                                     milk, soups, orange juice  Iced Popsicles                                     All solid food Carbonated beverages, regular and diet  Cranberry, grape and apple juices Sports drinks like Gatorade Lightly seasoned clear broth or consume(fat free) Sugar, honey syrup  Sample Menu Breakfast                                Lunch                                     Supper Cranberry juice                    Beef broth                            Chicken broth Jell-O                                     Grape juice                           Apple juice Coffee or tea                        Jell-O                                      Popsicle                                                Coffee or tea                        Coffee or tea  _____________________________________________________________________

## 2015-03-22 ENCOUNTER — Encounter (HOSPITAL_COMMUNITY): Payer: Self-pay

## 2015-03-22 ENCOUNTER — Encounter: Payer: Medicare Other | Admitting: Physical Therapy

## 2015-03-22 ENCOUNTER — Encounter (HOSPITAL_COMMUNITY)
Admission: RE | Admit: 2015-03-22 | Discharge: 2015-03-22 | Disposition: A | Discharge status: Home / Self Care | Payer: Medicare Other | Source: Ambulatory Visit | Attending: Orthopedic Surgery | Admitting: Orthopedic Surgery

## 2015-03-22 DIAGNOSIS — D649 Anemia, unspecified: Secondary | ICD-10-CM | POA: Diagnosis not present

## 2015-03-22 DIAGNOSIS — H4020X0 Unspecified primary angle-closure glaucoma, stage unspecified: Secondary | ICD-10-CM | POA: Diagnosis not present

## 2015-03-22 DIAGNOSIS — M19071 Primary osteoarthritis, right ankle and foot: Secondary | ICD-10-CM | POA: Diagnosis not present

## 2015-03-22 DIAGNOSIS — Z96652 Presence of left artificial knee joint: Secondary | ICD-10-CM | POA: Diagnosis not present

## 2015-03-22 DIAGNOSIS — M479 Spondylosis, unspecified: Secondary | ICD-10-CM | POA: Diagnosis not present

## 2015-03-22 DIAGNOSIS — M25562 Pain in left knee: Secondary | ICD-10-CM | POA: Diagnosis present

## 2015-03-22 DIAGNOSIS — M549 Dorsalgia, unspecified: Secondary | ICD-10-CM | POA: Diagnosis not present

## 2015-03-22 DIAGNOSIS — M24662 Ankylosis, left knee: Secondary | ICD-10-CM | POA: Diagnosis not present

## 2015-03-22 DIAGNOSIS — G4733 Obstructive sleep apnea (adult) (pediatric): Secondary | ICD-10-CM | POA: Diagnosis not present

## 2015-03-22 DIAGNOSIS — M19072 Primary osteoarthritis, left ankle and foot: Secondary | ICD-10-CM | POA: Diagnosis not present

## 2015-03-22 DIAGNOSIS — Z79899 Other long term (current) drug therapy: Secondary | ICD-10-CM | POA: Diagnosis not present

## 2015-03-22 DIAGNOSIS — M170 Bilateral primary osteoarthritis of knee: Secondary | ICD-10-CM | POA: Diagnosis not present

## 2015-03-22 DIAGNOSIS — G8929 Other chronic pain: Secondary | ICD-10-CM | POA: Diagnosis not present

## 2015-03-22 DIAGNOSIS — G629 Polyneuropathy, unspecified: Secondary | ICD-10-CM | POA: Diagnosis not present

## 2015-03-22 DIAGNOSIS — Z7982 Long term (current) use of aspirin: Secondary | ICD-10-CM | POA: Diagnosis not present

## 2015-03-22 HISTORY — DX: Anemia, unspecified: D64.9

## 2015-03-22 HISTORY — DX: Failed or difficult intubation, initial encounter: T88.4XXA

## 2015-03-22 HISTORY — DX: Pneumonia, unspecified organism: J18.9

## 2015-03-22 LAB — CBC
HCT: 34.8 % — ABNORMAL LOW (ref 36.0–46.0)
Hemoglobin: 10.5 g/dL — ABNORMAL LOW (ref 12.0–15.0)
MCH: 24.1 pg — ABNORMAL LOW (ref 26.0–34.0)
MCHC: 30.2 g/dL (ref 30.0–36.0)
MCV: 79.8 fL (ref 78.0–100.0)
PLATELETS: 294 10*3/uL (ref 150–400)
RBC: 4.36 MIL/uL (ref 3.87–5.11)
RDW: 15.8 % — ABNORMAL HIGH (ref 11.5–15.5)
WBC: 4.8 10*3/uL (ref 4.0–10.5)

## 2015-03-22 LAB — SURGICAL PCR SCREEN
MRSA, PCR: NEGATIVE
Staphylococcus aureus: NEGATIVE

## 2015-03-22 NOTE — Progress Notes (Signed)
11-19-14 - EKG - EPIC 12-09-14 - CXR - EPIC

## 2015-03-25 ENCOUNTER — Ambulatory Visit (HOSPITAL_COMMUNITY): Payer: Medicare Other | Admitting: Certified Registered Nurse Anesthetist

## 2015-03-25 ENCOUNTER — Encounter (HOSPITAL_COMMUNITY)
Admission: RE | Disposition: A | Discharge status: Home / Self Care | Payer: Self-pay | Source: Ambulatory Visit | Attending: Orthopedic Surgery

## 2015-03-25 ENCOUNTER — Encounter (HOSPITAL_COMMUNITY): Payer: Self-pay | Admitting: *Deleted

## 2015-03-25 ENCOUNTER — Ambulatory Visit (HOSPITAL_COMMUNITY)
Admission: RE | Admit: 2015-03-25 | Discharge: 2015-03-25 | Disposition: A | Discharge status: Home / Self Care | Payer: Medicare Other | Source: Ambulatory Visit | Attending: Orthopedic Surgery | Admitting: Orthopedic Surgery

## 2015-03-25 DIAGNOSIS — G8929 Other chronic pain: Secondary | ICD-10-CM | POA: Insufficient documentation

## 2015-03-25 DIAGNOSIS — M479 Spondylosis, unspecified: Secondary | ICD-10-CM | POA: Diagnosis not present

## 2015-03-25 DIAGNOSIS — G629 Polyneuropathy, unspecified: Secondary | ICD-10-CM | POA: Insufficient documentation

## 2015-03-25 DIAGNOSIS — D649 Anemia, unspecified: Secondary | ICD-10-CM | POA: Insufficient documentation

## 2015-03-25 DIAGNOSIS — M19072 Primary osteoarthritis, left ankle and foot: Secondary | ICD-10-CM | POA: Diagnosis not present

## 2015-03-25 DIAGNOSIS — M24662 Ankylosis, left knee: Secondary | ICD-10-CM | POA: Diagnosis not present

## 2015-03-25 DIAGNOSIS — G4733 Obstructive sleep apnea (adult) (pediatric): Secondary | ICD-10-CM | POA: Diagnosis not present

## 2015-03-25 DIAGNOSIS — Z7982 Long term (current) use of aspirin: Secondary | ICD-10-CM | POA: Insufficient documentation

## 2015-03-25 DIAGNOSIS — M179 Osteoarthritis of knee, unspecified: Secondary | ICD-10-CM | POA: Diagnosis not present

## 2015-03-25 DIAGNOSIS — M19071 Primary osteoarthritis, right ankle and foot: Secondary | ICD-10-CM | POA: Insufficient documentation

## 2015-03-25 DIAGNOSIS — T8482XA Fibrosis due to internal orthopedic prosthetic devices, implants and grafts, initial encounter: Secondary | ICD-10-CM | POA: Diagnosis present

## 2015-03-25 DIAGNOSIS — Z79899 Other long term (current) drug therapy: Secondary | ICD-10-CM | POA: Insufficient documentation

## 2015-03-25 DIAGNOSIS — M170 Bilateral primary osteoarthritis of knee: Secondary | ICD-10-CM | POA: Diagnosis not present

## 2015-03-25 DIAGNOSIS — Z96652 Presence of left artificial knee joint: Secondary | ICD-10-CM | POA: Insufficient documentation

## 2015-03-25 DIAGNOSIS — H4020X0 Unspecified primary angle-closure glaucoma, stage unspecified: Secondary | ICD-10-CM | POA: Insufficient documentation

## 2015-03-25 DIAGNOSIS — M549 Dorsalgia, unspecified: Secondary | ICD-10-CM | POA: Insufficient documentation

## 2015-03-25 HISTORY — PX: KNEE CLOSED REDUCTION: SHX995

## 2015-03-25 LAB — GLUCOSE, CAPILLARY: GLUCOSE-CAPILLARY: 98 mg/dL (ref 65–99)

## 2015-03-25 SURGERY — MANIPULATION, KNEE, CLOSED
Anesthesia: General | Site: Knee | Laterality: Left

## 2015-03-25 MED ORDER — LIDOCAINE HCL (CARDIAC) 20 MG/ML IV SOLN
INTRAVENOUS | Status: AC
  Filled 2015-03-25: qty 5

## 2015-03-25 MED ORDER — LIDOCAINE HCL (CARDIAC) 20 MG/ML IV SOLN
INTRAVENOUS | Status: DC | PRN
  Administered 2015-03-25: 100 mg via INTRAVENOUS

## 2015-03-25 MED ORDER — ONDANSETRON HCL 4 MG/2ML IJ SOLN
INTRAMUSCULAR | Status: AC
  Filled 2015-03-25: qty 2

## 2015-03-25 MED ORDER — HYDROMORPHONE HCL 1 MG/ML IJ SOLN
INTRAMUSCULAR | Status: AC
  Filled 2015-03-25: qty 1

## 2015-03-25 MED ORDER — FENTANYL CITRATE (PF) 100 MCG/2ML IJ SOLN
INTRAMUSCULAR | Status: DC | PRN
  Administered 2015-03-25 (×2): 50 ug via INTRAVENOUS

## 2015-03-25 MED ORDER — CHLORHEXIDINE GLUCONATE 4 % EX LIQD: 60.0000 mL | Freq: Once | CUTANEOUS | Status: DC

## 2015-03-25 MED ORDER — DEXAMETHASONE SODIUM PHOSPHATE 10 MG/ML IJ SOLN
INTRAMUSCULAR | Status: AC
  Filled 2015-03-25: qty 1

## 2015-03-25 MED ORDER — METHOCARBAMOL 500 MG PO TABS: 500.0000 mg | ORAL_TABLET | Freq: Four times a day (QID) | ORAL | Status: DC | PRN

## 2015-03-25 MED ORDER — ACETAMINOPHEN 10 MG/ML IV SOLN
INTRAVENOUS | Status: AC
  Filled 2015-03-25: qty 100

## 2015-03-25 MED ORDER — MEPERIDINE HCL 50 MG/ML IJ SOLN: 6.2500 mg | INTRAMUSCULAR | Status: DC | PRN

## 2015-03-25 MED ORDER — PROPOFOL 10 MG/ML IV BOLUS
INTRAVENOUS | Status: AC
  Filled 2015-03-25: qty 20

## 2015-03-25 MED ORDER — SODIUM CHLORIDE 0.9 % IV SOLN: INTRAVENOUS | Status: DC

## 2015-03-25 MED ORDER — FENTANYL CITRATE (PF) 100 MCG/2ML IJ SOLN
25.0000 ug | INTRAMUSCULAR | Status: DC | PRN
Start: 2015-03-25 — End: 2015-03-25
  Administered 2015-03-25 (×3): 50 ug via INTRAVENOUS

## 2015-03-25 MED ORDER — LACTATED RINGERS IV SOLN
INTRAVENOUS | Status: DC
  Administered 2015-03-25: 1000 mL via INTRAVENOUS

## 2015-03-25 MED ORDER — DEXAMETHASONE SODIUM PHOSPHATE 10 MG/ML IJ SOLN: 10.0000 mg | Freq: Once | INTRAMUSCULAR | Status: DC

## 2015-03-25 MED ORDER — FENTANYL CITRATE (PF) 100 MCG/2ML IJ SOLN
INTRAMUSCULAR | Status: AC
  Filled 2015-03-25: qty 4

## 2015-03-25 MED ORDER — ONDANSETRON HCL 4 MG/2ML IJ SOLN
INTRAMUSCULAR | Status: DC | PRN
  Administered 2015-03-25: 4 mg via INTRAVENOUS

## 2015-03-25 MED ORDER — PROPOFOL 10 MG/ML IV BOLUS
INTRAVENOUS | Status: DC | PRN
  Administered 2015-03-25: 160 mg via INTRAVENOUS

## 2015-03-25 MED ORDER — FENTANYL CITRATE (PF) 100 MCG/2ML IJ SOLN
INTRAMUSCULAR | Status: AC
  Filled 2015-03-25: qty 2

## 2015-03-25 MED ORDER — HYDROCODONE-ACETAMINOPHEN 5-325 MG PO TABS: 1.0000 | ORAL_TABLET | ORAL | Status: DC | PRN

## 2015-03-25 MED ORDER — DEXAMETHASONE SODIUM PHOSPHATE 10 MG/ML IJ SOLN
INTRAMUSCULAR | Status: DC | PRN
  Administered 2015-03-25: 10 mg via INTRAVENOUS

## 2015-03-25 MED ORDER — HYDROMORPHONE HCL 1 MG/ML IJ SOLN
0.2500 mg | INTRAMUSCULAR | Status: DC | PRN
  Administered 2015-03-25: 0.5 mg via INTRAVENOUS
  Administered 2015-03-25: 0.25 mg via INTRAVENOUS
  Administered 2015-03-25: 0.5 mg via INTRAVENOUS
  Administered 2015-03-25: 0.25 mg via INTRAVENOUS

## 2015-03-25 MED ORDER — ACETAMINOPHEN 10 MG/ML IV SOLN
1000.0000 mg | Freq: Once | INTRAVENOUS | Status: AC
  Administered 2015-03-25: 1000 mg via INTRAVENOUS
  Filled 2015-03-25: qty 100

## 2015-03-25 MED ORDER — PROMETHAZINE HCL 25 MG/ML IJ SOLN: 6.2500 mg | INTRAMUSCULAR | Status: DC | PRN

## 2015-03-25 MED ORDER — METHOCARBAMOL 1000 MG/10ML IJ SOLN
500.0000 mg | Freq: Once | INTRAVENOUS | Status: AC
  Administered 2015-03-25: 500 mg via INTRAVENOUS
  Filled 2015-03-25: qty 5

## 2015-03-25 NOTE — Anesthesia Postprocedure Evaluation (Signed)
  Anesthesia Post-op Note  Patient: Fonda KinderJanet G Mccombie  Procedure(s) Performed: Procedure(s) (LRB): CLOSED MANIPULATION LEFT KNEE (Left)  Patient Location: PACU  Anesthesia Type: General  Level of Consciousness: awake and alert   Airway and Oxygen Therapy: Patient Spontanous Breathing  Post-op Pain: mild  Post-op Assessment: Post-op Vital signs reviewed, Patient's Cardiovascular Status Stable, Respiratory Function Stable, Patent Airway and No signs of Nausea or vomiting  Last Vitals:  Filed Vitals:   03/25/15 1752  BP: 132/68  Pulse: 73  Temp: 36.6 C  Resp:     Post-op Vital Signs: stable   Complications: No apparent anesthesia complications

## 2015-03-25 NOTE — Transfer of Care (Signed)
Immediate Anesthesia Transfer of Care Note  Patient: Lauren English  Procedure(s) Performed: Procedure(s): CLOSED MANIPULATION LEFT KNEE (Left)  Patient Location: PACU  Anesthesia Type:General  Level of Consciousness: sedated  Airway & Oxygen Therapy: Patient Spontanous Breathing and Patient connected to face mask oxygen  Post-op Assessment: Report given to RN and Post -op Vital signs reviewed and stable  Post vital signs: Reviewed and stable  Last Vitals:  Filed Vitals:   03/25/15 1632  BP: 142/60  Pulse: 84  Temp: 36.9 C  Resp: 15    Complications: No apparent anesthesia complications

## 2015-03-25 NOTE — Anesthesia Preprocedure Evaluation (Addendum)
Anesthesia Evaluation  Patient identified by MRN, date of birth, ID band Patient awake    Reviewed: Allergy & Precautions, H&P , NPO status , Patient's Chart, lab work & pertinent test results  History of Anesthesia Complications (+) PONV and DIFFICULT AIRWAY  Airway Mallampati: III  TM Distance: >3 FB Neck ROM: Full  Mouth opening: Limited Mouth Opening  Dental no notable dental hx. (+) Teeth Intact, Dental Advisory Given   Pulmonary sleep apnea and Continuous Positive Airway Pressure Ventilation ,    Pulmonary exam normal breath sounds clear to auscultation       Cardiovascular negative cardio ROS   Rhythm:Regular Rate:Normal     Neuro/Psych negative neurological ROS  negative psych ROS   GI/Hepatic negative GI ROS, Neg liver ROS,   Endo/Other  negative endocrine ROS  Renal/GU negative Renal ROS  negative genitourinary   Musculoskeletal  (+) Arthritis , Osteoarthritis,    Abdominal   Peds  Hematology negative hematology ROS (+)   Anesthesia Other Findings   Reproductive/Obstetrics negative OB ROS                            Anesthesia Physical  Anesthesia Plan  ASA: III  Anesthesia Plan: General   Post-op Pain Management:    Induction: Intravenous  Airway Management Planned: Mask  Additional Equipment:   Intra-op Plan:   Post-operative Plan: Extubation in OR  Informed Consent: I have reviewed the patients History and Physical, chart, labs and discussed the procedure including the risks, benefits and alternatives for the proposed anesthesia with the patient or authorized representative who has indicated his/her understanding and acceptance.   Dental advisory given  Plan Discussed with: CRNA  Anesthesia Plan Comments:        Anesthesia Quick Evaluation

## 2015-03-25 NOTE — Op Note (Signed)
  OPERATIVE REPORT   PREOPERATIVE DIAGNOSIS: Arthrofibrosis, Left  knee.   POSTOPERATIVE DIAGNOSIS: Arthrofibrosis, Left knee.   PROCEDURE:  Left  knee closed manipulation.   SURGEON: Ollen GrossFrank Rudy Domek, MD   ASSISTANT: None.   ANESTHESIA: General.   COMPLICATIONS: None.   CONDITION: Stable to Recovery.   Pre-manipulation range of motion is 5-15 degrees.  Post-manipulation range of  Motion is 0 - 120 degrees  PROCEDURE IN DETAIL: After successful administration of general  anesthetic, exam under anesthesia was performed showing range of motion  5-15 degrees. I then placed my chest against the proximal tibia,  flexing the knee with audible lysis of adhesions. I was able to  get the knee flexed to 115  degrees. I then put the knee back in extension and with some  patellar manipulation and gentle pressure got to full  Extension.The patient was subsequently awakened and transported to Recovery in  stable condition.

## 2015-03-25 NOTE — H&P (Signed)
CC- Lauren English is a 74 y.o. female who presents with left knee pain.  HPI- . Knee Pain: Patient presents with stiffness involving the  left knee. Onset of the symptoms was several months ago. Inciting event: She had a left total knee arthroplasty with a subsequent post-op hematoma and quad disruption treated with repair. She had wound healing difficulties and eventually had a VAC placed and healed the wound well. Unfortunately she developed significant stiffness, not responsive to PT and presents now for attempted closed manipulation.    Past Medical History  Diagnosis Date  . PONV (postoperative nausea and vomiting)   . Bladder incontinence   . OSA on CPAP   . Arthritis     "qwhere; feet, knees, neck" (04/21/2012)  . Chronic back pain   . Closed angle glaucoma     "both eyes" (04/21/2012)  . Prediabetes   . Urinary tract bacterial infections   . Overactive bladder   . Numbness     fingertips bilat and feet bilat   . Peripheral neuropathy (HCC)     feet bilat   . Difficult intubation     with intubation on November 26, 2014  . Pneumonia     hx. of as child  . Anemia     Past Surgical History  Procedure Laterality Date  . Back surgery    . Knee arthroplasty  06/2008    "slipped on ice; torn ligaments & cartilage" (04/21/2012)  . Posterior lumbar fusion  04/21/2012  . Tonsillectomy and adenoidectomy  ~ 1950  . Abdominal hysterectomy  1980    "partial" (04/21/2012)  . Bladder suspension  1990  . Anterior fusion cervical spine  12/2005  . Tubal ligation  1970's  . Dilation and curettage of uterus  1980  . Appendectomy    . 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 Patellar tendon repair Left 12/10/2014    Procedure: REPAIR PARTIAL QUAD TENDON TEAR;  Surgeon: Ollen Gross, MD;  Location: [facility];  Service: Orthopedics;  Laterality: Left;     Prior to Admission medications   Medication Sig Start Date End Date Taking? Authorizing Provider  ACETYLCARNITINE PO Take 1 capsule by mouth daily.   Yes Historical Provider, MD  amoxicillin (AMOXIL) 500 MG capsule Take 2,000 mg by mouth once as needed (Take one hour prior to dental procedures.).  02/27/15  Yes Historical Provider, MD  aspirin EC 81 MG tablet Take 81 mg by mouth daily.   Yes Historical Provider, MD  calcium carbonate (OS-CAL) 600 MG tablet Take 600 mg by mouth 2 (two) times daily with a meal.   Yes Historical Provider, MD  Cholecalciferol (VITAMIN D) 2000 UNITS tablet Take 2,000 Units by mouth every evening.   Yes Historical Provider, MD  docusate sodium (COLACE) 100 MG capsule Take 100 mg by mouth 2 (two) times daily.   Yes Historical Provider, MD  ferrous sulfate 325 (65 FE) MG tablet Take 325 mg by mouth daily with breakfast.   Yes Historical Provider, MD  OVER THE COUNTER MEDICATION Take 1 tablet by mouth 2 (two) times daily. De Dry Eye Omega Supplement   Yes Historical Provider, MD  solifenacin (VESICARE) 10 MG tablet Take 10 mg by mouth daily.   Yes Historical Provider, MD  traMADol (ULTRAM) 50 MG tablet Take 1-2 tablets (50-100 mg total) by mouth every  6 (six) hours as needed (mild pain). 12/12/14  Yes Avel Peacerew Perkins, PA-C  TURMERIC PO Take 1 capsule by mouth every evening.   Yes Historical Provider, MD   Knee Exam antalgic gait, no warmth or effusion, ROM 5-15 degrees  Physical Examination: General appearance - alert, well appearing, and in no distress Mental status - alert, oriented to person, place, and time Chest - clear to auscultation, no wheezes, rales or rhonchi, symmetric air entry Heart - normal rate, regular rhythm, normal S1, S2, no murmurs, rubs, clicks or gallops Abdomen - soft, nontender, nondistended, no masses or organomegaly Neurological - alert, oriented, normal speech, no focal findings or movement disorder noted   Asessment/Plan--- Left knee  arthrofibrosis- - Plan left knee closed manipulation. Procedure risks and potential comps discussed with patient who elects to proceed. Goals are decreased pain and increased function with a high likelihood of achieving both

## 2015-03-26 ENCOUNTER — Encounter (HOSPITAL_COMMUNITY): Payer: Self-pay | Admitting: Orthopedic Surgery

## 2015-03-27 ENCOUNTER — Ambulatory Visit: Payer: Medicare Other

## 2015-03-27 DIAGNOSIS — M25662 Stiffness of left knee, not elsewhere classified: Secondary | ICD-10-CM | POA: Diagnosis not present

## 2015-03-27 DIAGNOSIS — M7989 Other specified soft tissue disorders: Secondary | ICD-10-CM

## 2015-03-27 DIAGNOSIS — R531 Weakness: Secondary | ICD-10-CM | POA: Diagnosis not present

## 2015-03-27 DIAGNOSIS — M25562 Pain in left knee: Secondary | ICD-10-CM | POA: Diagnosis not present

## 2015-03-27 DIAGNOSIS — R269 Unspecified abnormalities of gait and mobility: Secondary | ICD-10-CM

## 2015-03-27 NOTE — Therapy (Signed)
[facility] [address], [address] [place], [place], [address] Phone: [phone]   Fax:  [phone]  Physical Therapy Treatment  Patient Details  Name: NATAHSA MARIAN MRN: [id number] Date of Birth: 1940/09/23 Referring Provider: Ollen Gross, MD  Encounter Date: 03/27/2015      PT End of Session - 03/27/15 0925    Visit Number 5   Number of Visits 10   Date for PT Re-Evaluation 05/22/15   PT Start Time 0851   PT Stop Time 0945   PT Time Calculation (min) 54 min   Activity Tolerance Patient tolerated treatment well   Behavior During Therapy [facility] for tasks assessed/performed      Past Medical History  Diagnosis Date  . PONV (postoperative nausea and vomiting)   . Bladder incontinence   . OSA on CPAP   . Arthritis     "qwhere; feet, knees, neck" (04/21/2012)  . Chronic back pain   . Closed angle glaucoma     "both eyes" (04/21/2012)  . Prediabetes   . Urinary tract bacterial infections   . Overactive bladder   . Numbness     fingertips bilat and feet bilat   . Peripheral neuropathy (HCC)     feet bilat   . Difficult intubation     with intubation on November 26, 2014  . Pneumonia     hx. of as child  . Anemia     Past Surgical History  Procedure Laterality Date  . Back surgery    . Knee arthroplasty  06/2008    "slipped on ice; torn ligaments & cartilage" (04/21/2012)  . Posterior lumbar fusion  04/21/2012  . Tonsillectomy and adenoidectomy  ~ 1950  . Abdominal hysterectomy  1980    "partial" (04/21/2012)  . Bladder suspension  1990  . Anterior fusion cervical spine  12/2005  . Tubal ligation  1970's  . Dilation and curettage of uterus  1980  . Appendectomy    . 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 Patellar tendon repair Left 12/10/2014    Procedure: REPAIR PARTIAL  QUAD TENDON TEAR;  Surgeon: Ollen Gross, MD;  Location: [facility];  Service: Orthopedics;  Laterality: Left;  . Knee closed reduction Left 03/25/2015    Procedure: CLOSED MANIPULATION LEFT KNEE;  Surgeon: Ollen Gross, MD;  Location: [facility];  Service: Orthopedics;  Laterality: Left;    There were no vitals filed for this visit.  Visit Diagnosis:  Stiffness of left knee - Plan: PT plan of care cert/re-cert  Left knee pain - Plan: PT plan of care cert/re-cert  Weakness - Plan: PT plan of care cert/re-cert  Abnormality of gait - Plan: PT plan of care cert/re-cert  Swelling of limb - Plan: PT plan of care cert/re-cert      Subjective Assessment - 03/27/15 0854    Subjective Pt had Lt knee manipulation surgery 03/25/15.  Pt reports that MD got knee to 115 degrees of flexion. Very sore now.     Currently in Pain? Yes   Pain Score 6    Pain Location Knee   Pain Orientation Left   Pain Descriptors / Indicators Sore;Tightness   Pain Type Surgical pain   Pain Onset More than a month ago   Pain Frequency Intermittent   Aggravating Factors  Pain with bending knee  Pain Relieving Factors rest, ice, not bending the knee            [facility] PT Assessment - 03/27/15 0001    Assessment   Medical Diagnosis S/P left total knee arthroplasty, wound debridement, Lt knee manipulation    Onset Date/Surgical Date 03/25/15  manipulation surgery   Balance Screen   Has the patient fallen in the past 6 months No   Has the patient had a decrease in activity level because of a fear of falling?  No   Is the patient reluctant to leave their home because of a fear of falling?  No   Home Environment   Living Environment Private residence   Living Arrangements Spouse/significant other   Type of Home House   Home Access Stairs to enter   Entrance Stairs-Number of Steps 2   Home Layout Multi-level  3 levels   Alternate Level Stairs-Number of Steps 15   Alternate Level Stairs-Rails Can reach both    Prior Function   Level of Independence Independent with household mobility with device;Independent with basic ADLs   Vocation Retired   Insurance claims handler   Overall Cognitive Status Within Functional Limits for tasks assessed   Observation/Other Assessments-Edema    Edema Circumferential   Circumferential Edema   Circumferential - Left  52 cm   ROM / Strength   AROM / PROM / Strength AROM;PROM   AROM   AROM Assessment Site Knee   Right/Left Knee Left   Left Knee Extension 0   Left Knee Flexion 43   PROM   PROM Assessment Site Knee   Right/Left Knee Left   Left Knee Extension 0   Left Knee Flexion 56   Palpation   Patella mobility limited Lt patellar mobility in all directions on the Lt   Palpation comment pitting edema in the Lt LE about the knee and ankle, warmth about the Lt knee joint                     [facility] Adult PT Treatment/Exercise - 03/27/15 0001    Knee/Hip Exercises: Aerobic   Stationary Bike level 0 for ROM gains x 5 minutes   Modalities   Modalities Vasopneumatic   Vasopneumatic   Number Minutes Vasopneumatic  15 minutes   Vasopnuematic Location  Knee   Vasopneumatic Pressure Medium   Vasopneumatic Temperature  3 snowflakes   Manual Therapy   Manual Therapy Soft tissue mobilization;Passive ROM   Manual therapy comments PROM into flexion within available ROM, soft tissue mobilization to quads and hamstrings  performed in sitting at edge of mat table                  PT Short Term Goals - 03/27/15 0921    PT SHORT TERM GOAL #1   Title be independent in initial HEP   Status Achieved   PT SHORT TERM GOAL #2   Title demonstrate Lt knee AROM flexion > or = to 75 degrees   Time 3   Period Weeks   Status New           PT Long Term Goals - 03/27/15 4540    PT LONG TERM GOAL #1   Title be independent in advanced HEP   Time 8   Period Weeks   Status New   PT LONG TERM GOAL #2   Title improve FOTO to < or = to 48%  limitaiton   Time 8  Period Days   Status New   PT LONG TERM GOAL #3   Title demonstrate Lt knee AROM flexion to > or = to 95 degrees to improve sitting   Time 8   Period Weeks   Status New   PT LONG TERM GOAL #4   Title demonstrate 4+/5 Lt hip strength to improve gait   Time 8   Period Weeks   Status New   PT LONG TERM GOAL #5   Title wean from crutch and demonstrate Lt knee flexion with swing through phase of gait on level surface   Time 8   Period Weeks   Status New               Plan - 03/27/15 [id number]    Clinical Impression Statement Pt is s/p Lt knee manipulation surgery 2 days ago.  Pt achieved Lt knee flexion to 115 degrees in surgery.  Pt demonstrated 0-55 degrees PROM today.  Pt still with significant edema, limited AROM and hip strength and gait abnormality.  Pt will benefit from skilled PT for ROM, strength, gait and edema managment to allow for sitting and gait without subsitution   Pt will benefit from skilled therapeutic intervention in order to improve on the following deficits Abnormal gait;Decreased activity tolerance;Difficulty walking;Increased edema;Impaired flexibility;Decreased strength;Hypomobility;Decreased range of motion;Decreased endurance;Decreased mobility;Decreased scar mobility;Increased muscle spasms;Pain;Increased fascial restricitons   Rehab Potential Excellent   PT Frequency 3x / week   PT Duration 8 weeks   PT Treatment/Interventions ADLs/Self Care Home Management;Cryotherapy;Electrical Stimulation;Gait training;Ultrasound;Moist Heat;Stair training;Functional mobility training;Therapeutic activities;Therapeutic exercise;Neuromuscular re-education;Manual techniques;Patient/family education;Scar mobilization;Passive range of motion;Taping;Vasopneumatic Device   PT Next Visit Plan Lt knee flexion as tolerated, edema management, quad and hip strength on the Lt.  Gait training   Consulted and Agree with Plan of Care Patient        Problem  List Patient Active Problem List   Diagnosis Date Noted  . Arthrofibrosis of total knee arthroplasty (HCC) 03/25/2015  . Quadriceps tendon rupture 12/09/2014  . OA (osteoarthritis) of knee 11/26/2014    TAKACS,KELLY, PT 03/27/2015, 9:31 AM  [facility] [address], [address] [place], [place], [address] Phone: [phone]   Fax:  [phone]  Name: Fonda KinderJanet G Oguin MRN: [id number] Date of Birth: 06-20-1940

## 2015-03-29 ENCOUNTER — Encounter: Payer: Self-pay | Admitting: Physical Therapy

## 2015-03-29 ENCOUNTER — Ambulatory Visit: Payer: Medicare Other | Admitting: Physical Therapy

## 2015-03-29 DIAGNOSIS — M7989 Other specified soft tissue disorders: Secondary | ICD-10-CM

## 2015-03-29 DIAGNOSIS — R269 Unspecified abnormalities of gait and mobility: Secondary | ICD-10-CM

## 2015-03-29 DIAGNOSIS — M25562 Pain in left knee: Secondary | ICD-10-CM | POA: Diagnosis not present

## 2015-03-29 DIAGNOSIS — M25662 Stiffness of left knee, not elsewhere classified: Secondary | ICD-10-CM

## 2015-03-29 DIAGNOSIS — R531 Weakness: Secondary | ICD-10-CM

## 2015-03-29 NOTE — Therapy (Signed)
[facility] [address] [place], [place], [address] Phone: [phone]   Fax:  [phone]  Physical Therapy Treatment  Patient Details  Name: CARSON MECHE MRN: [id number] Date of Birth: March 13, 1941 Referring Provider: Ollen Gross, MD  Encounter Date: 03/29/2015      PT End of Session - 03/29/15 1054    Visit Number 6   Number of Visits 10   Date for PT Re-Evaluation 05/22/15   PT Start Time 0845   PT Stop Time 0947   PT Time Calculation (min) 62 min   Activity Tolerance Patient tolerated treatment well   Behavior During Therapy [facility] for tasks assessed/performed      Past Medical History  Diagnosis Date  . PONV (postoperative nausea and vomiting)   . Bladder incontinence   . OSA on CPAP   . Arthritis     "qwhere; feet, knees, neck" (04/21/2012)  . Chronic back pain   . Closed angle glaucoma     "both eyes" (04/21/2012)  . Prediabetes   . Urinary tract bacterial infections   . Overactive bladder   . Numbness     fingertips bilat and feet bilat   . Peripheral neuropathy (HCC)     feet bilat   . Difficult intubation     with intubation on November 26, 2014  . Pneumonia     hx. of as child  . Anemia     Past Surgical History  Procedure Laterality Date  . Back surgery    . Knee arthroplasty  06/2008    "slipped on ice; torn ligaments & cartilage" (04/21/2012)  . Posterior lumbar fusion  04/21/2012  . Tonsillectomy and adenoidectomy  ~ 1950  . Abdominal hysterectomy  1980    "partial" (04/21/2012)  . Bladder suspension  1990  . Anterior fusion cervical spine  12/2005  . Tubal ligation  1970's  . Dilation and curettage of uterus  1980  . Appendectomy    . 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 Patellar tendon repair Left 12/10/2014    Procedure: REPAIR PARTIAL  QUAD TENDON TEAR;  Surgeon: Ollen Gross, MD;  Location: [facility];  Service: Orthopedics;  Laterality: Left;  . Knee closed reduction Left 03/25/2015    Procedure: CLOSED MANIPULATION LEFT KNEE;  Surgeon: Ollen Gross, MD;  Location: [facility];  Service: Orthopedics;  Laterality: Left;    There were no vitals filed for this visit.  Visit Diagnosis:  Stiffness of left knee  Left knee pain  Weakness  Abnormality of gait  Swelling of limb      Subjective Assessment - 03/29/15 0908    Subjective Pt rates her pain as 7/10, very difficutl with transfers sit to stand, Pt needs to use hydrocodin every 4 hours and is using ice to control pain and swelling   Patient is accompained by: Family member   Limitations Sitting   How long can you sit comfortably? not able to bend knee with sitting   How long can you stand comfortably? 30 minutes   How long can you walk comfortably? pt is using a crutch   Patient Stated Goals bend the knee, walk without crutch   Currently in Pain? Yes   Pain Score 7    Pain Location Knee   Pain Orientation Left   Pain Descriptors / Indicators  Sore;Tightness   Pain Type Surgical pain   Pain Onset More than a month ago   Multiple Pain Sites No                         [facility] Adult PT Treatment/Exercise - 03/29/15 0001    Exercises   Exercises Knee/Hip   Knee/Hip Exercises: Stretches   Lobbyist --   Knee/Hip Exercises: Aerobic   Nustep for bending Level 1x 10   Knee/Hip Exercises: Supine   Quad Sets Other (comment);4 sets  edema control and activation, pt advised to perform at home   Heel Slides AROM;2 sets;10 reps   Straight Leg Raises Strengthening;Left;2 sets;10 reps  from elevated surface   Modalities   Modalities Vasopneumatic;Ultrasound   Ultrasound   Ultrasound Location Lt med thight   Ultrasound Parameters , 100%, 1.2 W/cm, x   Ultrasound Goals Edema;Pain   Vasopneumatic   Number Minutes Vasopneumatic  15 minutes    Vasopnuematic Location  Knee   Vasopneumatic Pressure Medium   Vasopneumatic Temperature  3 snowflakes   Manual Therapy   Manual Therapy Soft tissue mobilization;Passive ROM   Manual therapy comments PROM into flexion within available ROM, soft tissue mobilization to quads and hamstrings  performed in supine                  PT Short Term Goals - 03/27/15 0921    PT SHORT TERM GOAL #1   Title be independent in initial HEP   Status Achieved   PT SHORT TERM GOAL #2   Title demonstrate Lt knee AROM flexion > or = to 75 degrees   Time 3   Period Weeks   Status New           PT Long Term Goals - 03/27/15 1610    PT LONG TERM GOAL #1   Title be independent in advanced HEP   Time 8   Period Weeks   Status New   PT LONG TERM GOAL #2   Title improve FOTO to < or = to 48% limitaiton   Time 8   Period Days   Status New   PT LONG TERM GOAL #3   Title demonstrate Lt knee AROM flexion to > or = to 95 degrees to improve sitting   Time 8   Period Weeks   Status New   PT LONG TERM GOAL #4   Title demonstrate 4+/5 Lt hip strength to improve gait   Time 8   Period Weeks   Status New   PT LONG TERM GOAL #5   Title wean from crutch and demonstrate Lt knee flexion with swing through phase of gait on level surface   Time 8   Period Weeks   Status New               Plan - 03/29/15 1054    Clinical Impairments Affecting Rehab Potential Lt knee manipulation TKA replacement in July 2016, Patellatendon rupture, Haematoma, open wound,         Problem List Patient Active Problem List   Diagnosis Date Noted  . Arthrofibrosis of total knee arthroplasty (HCC) 03/25/2015  . Quadriceps tendon rupture 12/09/2014  . OA (osteoarthritis) of knee 11/26/2014    NAUMANN-HOUEGNIFIO,Kayon Dozier PTA 03/29/2015, 11:46 AM  [facility] [address] [place], [place], [address] Phone: [phone]   Fax:   [phone]  Name: CLOIE WOODEN MRN: [id number]  Date of Birth: [birth year]

## 2015-04-01 ENCOUNTER — Encounter: Payer: Self-pay | Admitting: Physical Therapy

## 2015-04-01 ENCOUNTER — Ambulatory Visit: Payer: Medicare Other | Admitting: Physical Therapy

## 2015-04-01 DIAGNOSIS — M25662 Stiffness of left knee, not elsewhere classified: Secondary | ICD-10-CM

## 2015-04-01 DIAGNOSIS — R269 Unspecified abnormalities of gait and mobility: Secondary | ICD-10-CM

## 2015-04-01 DIAGNOSIS — M7989 Other specified soft tissue disorders: Secondary | ICD-10-CM | POA: Diagnosis not present

## 2015-04-01 DIAGNOSIS — R531 Weakness: Secondary | ICD-10-CM | POA: Diagnosis not present

## 2015-04-01 DIAGNOSIS — M25562 Pain in left knee: Secondary | ICD-10-CM

## 2015-04-01 NOTE — Therapy (Signed)
[facility] [address], [address] [place], [place], [address] Phone: [phone]   Fax:  [phone]  Physical Therapy Treatment  Patient Details  Name: Lauren English MRN: [id number] Date of Birth: [birth year] Referring Provider: Ollen Gross, MD  Encounter Date: 04/01/2015      PT End of Session - 04/01/15 0906    Visit Number 7   Number of Visits 10   Date for PT Re-Evaluation 05/22/15   PT Start Time 0855   PT Stop Time 0947   PT Time Calculation (min) 52 min   Activity Tolerance Patient tolerated treatment well   Behavior During Therapy [facility] for tasks assessed/performed      Past Medical History  Diagnosis Date  . PONV (postoperative nausea and vomiting)   . Bladder incontinence   . OSA on CPAP   . Arthritis     "qwhere; feet, knees, neck" (04/21/2012)  . Chronic back pain   . Closed angle glaucoma     "both eyes" (04/21/2012)  . Prediabetes   . Urinary tract bacterial infections   . Overactive bladder   . Numbness     fingertips bilat and feet bilat   . Peripheral neuropathy (HCC)     feet bilat   . Difficult intubation     with intubation on November 26, 2014  . Pneumonia     hx. of as child  . Anemia     Past Surgical History  Procedure Laterality Date  . Back surgery    . Knee arthroplasty  06/2008    "slipped on ice; torn ligaments & cartilage" (04/21/2012)  . Posterior lumbar fusion  04/21/2012  . Tonsillectomy and adenoidectomy  ~ 1950  . Abdominal hysterectomy  1980    "partial" (04/21/2012)  . Bladder suspension  1990  . Anterior fusion cervical spine  12/2005  . Tubal ligation  1970's  . Dilation and curettage of uterus  1980  . Appendectomy    . 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 Patellar tendon repair Left 12/10/2014    Procedure: REPAIR PARTIAL  QUAD TENDON TEAR;  Surgeon: Ollen Gross, MD;  Location: [facility];  Service: Orthopedics;  Laterality: Left;  . Knee closed reduction Left 03/25/2015    Procedure: CLOSED MANIPULATION LEFT KNEE;  Surgeon: Ollen Gross, MD;  Location: [facility];  Service: Orthopedics;  Laterality: Left;    There were no vitals filed for this visit.  Visit Diagnosis:  Stiffness of left knee  Left knee pain  Weakness  Abnormality of gait  Swelling of limb      Subjective Assessment - 04/01/15 0900    Subjective Pt reports her pain as 5/10, pt with less pain on med tight. Pt is now using Tremadol for pain control.    Patient is accompained by: Family member   Currently in Pain? Yes   Pain Score 5    Pain Location Knee   Pain Orientation Left   Pain Descriptors / Indicators Sore   Pain Type Surgical pain   Pain Onset More than a month ago   Pain Frequency Intermittent   Multiple Pain Sites No                         [facility] Adult PT Treatment/Exercise - 04/01/15 0001  Exercises   Exercises Knee/Hip   Knee/Hip Exercises: Aerobic   Nustep for bending Level 1x 10  seat and arms #10   Knee/Hip Exercises: Supine   Quad Sets Other (comment);4 sets  edma managment and quadriceps activation   Heel Slides AROM;2 sets;10 reps   Modalities   Modalities Vasopneumatic;Ultrasound   Ultrasound   Ultrasound Location Lt med tight   Ultrasound Parameters 1MHz, 1Mhz, 1.2 W/cm   Ultrasound Goals Edema   Vasopneumatic   Number Minutes Vasopneumatic  15 minutes   Vasopnuematic Location  Knee   Vasopneumatic Pressure Medium   Vasopneumatic Temperature  3 snowflakes   Manual Therapy   Manual Therapy --   Manual therapy comments --                  PT Short Term Goals - 04/01/15 [id number]    PT SHORT TERM GOAL #1   Title be independent in initial HEP   Time 3   Period Weeks   Status Achieved   PT SHORT TERM GOAL #2   Title demonstrate Lt knee AROM flexion > or = to 75 degrees    Time 3   Period Weeks   Status On-going   PT SHORT TERM GOAL #3   Title --   PT SHORT TERM GOAL #4   Title --           PT Long Term Goals - 04/01/15 0939    PT LONG TERM GOAL #1   Title be independent in advanced HEP   Time 8   Period Weeks   Status On-going   PT LONG TERM GOAL #2   Title improve FOTO to < or = to 48% limitaiton   Time 8   Period Weeks   Status On-going   PT LONG TERM GOAL #3   Title demonstrate Lt knee AROM flexion to > or = to 95 degrees to improve sitting   Time 8   Period Weeks   Status On-going   PT LONG TERM GOAL #5   Title wean from crutch and demonstrate Lt knee flexion with swing through phase of gait on level surface   Time 8   Period Weeks   Status On-going   PT LONG TERM GOAL #6   Title --   PT LONG TERM GOAL #7   Title --               Plan - 04/01/15 0906    Clinical Impression Statement Pt with improved activity tolerance today due to less pain than last Friday. Pt will continue to benefit from skilled PT to improve ROM, strength and endurance and edema control   Pt will benefit from skilled therapeutic intervention in order to improve on the following deficits Abnormal gait;Decreased activity tolerance;Difficulty walking;Increased edema;Impaired flexibility;Decreased strength;Hypomobility;Decreased range of motion;Decreased endurance;Decreased mobility;Decreased scar mobility;Increased muscle spasms;Pain;Increased fascial restricitons   Rehab Potential Excellent   Clinical Impairments Affecting Rehab Potential Lt knee manipulation TKA replacement in July 2016, Patellatendon rupture, Haematoma, open wound,    PT Frequency 3x / week   PT Duration 8 weeks   PT Treatment/Interventions ADLs/Self Care Home Management;Cryotherapy;Electrical Stimulation;Gait training;Ultrasound;Moist Heat;Stair training;Functional mobility training;Therapeutic activities;Therapeutic exercise;Neuromuscular re-education;Manual techniques;Patient/family  education;Scar mobilization;Passive range of motion;Taping;Vasopneumatic Device   PT Next Visit Plan Lt knee flexion as tolerated, edema management, US, quad and hip strength on the Lt.  Gait training   Consulted and Agree with Plan of Care Patient        Problem  List Patient Active Problem List   Diagnosis Date Noted  . Arthrofibrosis of total knee arthroplasty (HCC) 03/25/2015  . Quadriceps tendon rupture 12/09/2014  . OA (osteoarthritis) of knee 11/26/2014    NAUMANN-HOUEGNIFIO,Lauren English PTA 04/01/2015, 9:42 AM  [facility] [address] [place], [place], [address] Phone: [phone]   Fax:  [phone]  Name: Lauren English MRN: [id number] Date of Birth: Mar 16, 1941

## 2015-04-02 DIAGNOSIS — R7309 Other abnormal glucose: Secondary | ICD-10-CM | POA: Diagnosis not present

## 2015-04-02 DIAGNOSIS — M899 Disorder of bone, unspecified: Secondary | ICD-10-CM | POA: Diagnosis not present

## 2015-04-02 DIAGNOSIS — Z23 Encounter for immunization: Secondary | ICD-10-CM | POA: Diagnosis not present

## 2015-04-02 DIAGNOSIS — Z0000 Encounter for general adult medical examination without abnormal findings: Secondary | ICD-10-CM | POA: Diagnosis not present

## 2015-04-02 DIAGNOSIS — E6609 Other obesity due to excess calories: Secondary | ICD-10-CM | POA: Diagnosis not present

## 2015-04-02 DIAGNOSIS — D500 Iron deficiency anemia secondary to blood loss (chronic): Secondary | ICD-10-CM | POA: Diagnosis not present

## 2015-04-02 DIAGNOSIS — Z136 Encounter for screening for cardiovascular disorders: Secondary | ICD-10-CM | POA: Diagnosis not present

## 2015-04-03 ENCOUNTER — Ambulatory Visit: Payer: Medicare Other | Admitting: Physical Therapy

## 2015-04-03 ENCOUNTER — Encounter: Payer: Self-pay | Admitting: Physical Therapy

## 2015-04-03 DIAGNOSIS — R269 Unspecified abnormalities of gait and mobility: Secondary | ICD-10-CM

## 2015-04-03 DIAGNOSIS — M7989 Other specified soft tissue disorders: Secondary | ICD-10-CM | POA: Diagnosis not present

## 2015-04-03 DIAGNOSIS — R531 Weakness: Secondary | ICD-10-CM | POA: Diagnosis not present

## 2015-04-03 DIAGNOSIS — M25562 Pain in left knee: Secondary | ICD-10-CM | POA: Diagnosis not present

## 2015-04-03 DIAGNOSIS — M25662 Stiffness of left knee, not elsewhere classified: Secondary | ICD-10-CM

## 2015-04-03 NOTE — Therapy (Signed)
[facility] [address], [address] [place], [place], [address] Phone: [phone]   Fax:  [phone]  Physical Therapy Treatment  Patient Details  Name: Lauren English MRN: [id number] Date of Birth: 1940/06/08 Referring Provider: Ollen GrossAluisio, Frank, MD  Encounter Date: 04/03/2015      PT End of Session - 04/03/15 0939    Visit Number 8   Number of Visits 10   Date for PT Re-Evaluation 05/22/15   PT Start Time 0845   PT Stop Time 0948   PT Time Calculation (min) 63 min   Activity Tolerance Patient tolerated treatment well   Behavior During Therapy [facility] for tasks assessed/performed      Past Medical History  Diagnosis Date  . PONV (postoperative nausea and vomiting)   . Bladder incontinence   . OSA on CPAP   . Arthritis     "qwhere; feet, knees, neck" (04/21/2012)  . Chronic back pain   . Closed angle glaucoma     "both eyes" (04/21/2012)  . Prediabetes   . Urinary tract bacterial infections   . Overactive bladder   . Numbness     fingertips bilat and feet bilat   . Peripheral neuropathy (HCC)     feet bilat   . Difficult intubation     with intubation on November 26, 2014  . Pneumonia     hx. of as child  . Anemia     Past Surgical History  Procedure Laterality Date  . Back surgery    . Knee arthroplasty  06/2008    "slipped on ice; torn ligaments & cartilage" (04/21/2012)  . Posterior lumbar fusion  04/21/2012  . Tonsillectomy and adenoidectomy  ~ 1950  . Abdominal hysterectomy  1980    "partial" (04/21/2012)  . Bladder suspension  1990  . Anterior fusion cervical spine  12/2005  . Tubal ligation  1970's  . Dilation and curettage of uterus  1980  . Appendectomy    . 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 Patellar tendon repair Left 12/10/2014    Procedure: REPAIR PARTIAL  QUAD TENDON TEAR;  Surgeon: Ollen GrossFrank Aluisio, MD;  [facility]: [facility];  Service: Orthopedics;  Laterality: Left;  . Knee closed reduction Left 03/25/2015    Procedure: CLOSED MANIPULATION LEFT KNEE;  Surgeon: Ollen GrossFrank Aluisio, MD;  [facility]: [facility];  Service: Orthopedics;  Laterality: Left;    There were no vitals filed for this visit.  Visit Diagnosis:  Stiffness of left knee  Left knee pain  Weakness  Abnormality of gait  Swelling of limb                       [facility] Adult PT Treatment/Exercise - 04/03/15 0001    Exercises   Exercises Knee/Hip   Knee/Hip Exercises: Stretches   Quad Stretch 3 reps;20 seconds  at stairs   Knee/Hip Exercises: Aerobic   Nustep for bending Level 1x 10  seat & arms #10   Knee/Hip Exercises: Standing   Rebounder weightshift in 3 directions x 1 min each   Knee/Hip Exercises: Sidelying   Hip ABduction Strengthening;Left;10 reps;3 sets   Knee/Hip Exercises: Prone   Hip Extension Strengthening;Left;3 sets;10 reps   Modalities   Modalities Vasopneumatic;Ultrasound   Ultrasound   Ultrasound Location Lt med thight   Ultrasound Parameters 1Mhz, 1W/cm,  1.2W/cm   Ultrasound Goals Edema   Vasopneumatic   Number Minutes Vasopneumatic  15 minutes   Vasopnuematic Location  Knee   Vasopneumatic Pressure Medium   Vasopneumatic Temperature  3 snowflakes                  PT Short Term Goals - 04/01/15 1610    PT SHORT TERM GOAL #1   Title be independent in initial HEP   Time 3   Period Weeks   Status Achieved   PT SHORT TERM GOAL #2   Title demonstrate Lt knee AROM flexion > or = to 75 degrees   Time 3   Period Weeks   Status On-going   PT SHORT TERM GOAL #3   Title --   PT SHORT TERM GOAL #4   Title --           PT Long Term Goals - 04/01/15 0939    PT LONG TERM GOAL #1   Title be independent in advanced HEP   Time 8   Period Weeks   Status On-going   PT LONG TERM GOAL #2   Title improve FOTO to < or = to 48%  limitaiton   Time 8   Period Weeks   Status On-going   PT LONG TERM GOAL #3   Title demonstrate Lt knee AROM flexion to > or = to 95 degrees to improve sitting   Time 8   Period Weeks   Status On-going   PT LONG TERM GOAL #5   Title wean from crutch and demonstrate Lt knee flexion with swing through phase of gait on level surface   Time 8   Period Weeks   Status On-going   PT LONG TERM GOAL #6   Title --   PT LONG TERM GOAL #7   Title --               Plan - 04/03/15 0940    Clinical Impression Statement Pt continues to improve with her activity tolerance due to less pain today and slighly less edema in right thight. Pt will continue to benefit from skilled PT to improve ROM, strength and endurance and edema control   Pt will benefit from skilled therapeutic intervention in order to improve on the following deficits Abnormal gait;Decreased activity tolerance;Difficulty walking;Increased edema;Impaired flexibility;Decreased strength;Hypomobility;Decreased range of motion;Decreased endurance;Decreased mobility;Decreased scar mobility;Increased muscle spasms;Pain;Increased fascial restricitons   Rehab Potential Excellent   PT Treatment/Interventions ADLs/Self Care Home Management;Cryotherapy;Electrical Stimulation;Gait training;Ultrasound;Moist Heat;Stair training;Functional mobility training;Therapeutic activities;Therapeutic exercise;Neuromuscular re-education;Manual techniques;Patient/family education;Scar mobilization;Passive range of motion;Taping;Vasopneumatic Device   PT Next Visit Plan Lt knee flexion as tolerated, edema management, [place], quad and hip strength on the Lt.  Gait training   Consulted and Agree with Plan of Care Patient        Problem List Patient Active Problem List   Diagnosis Date Noted  . Arthrofibrosis of total knee arthroplasty (HCC) 03/25/2015  . Quadriceps tendon rupture 12/09/2014  . OA (osteoarthritis) of knee 11/26/2014     NAUMANN-HOUEGNIFIO,Jamario Colina PTA 04/03/2015, 9:45 AM  [facility] [address] [place], [place], [address] Phone: [phone]   Fax:  [phone]  Name: Lauren English MRN: [id number] Date of Birth: [birth year]

## 2015-04-08 ENCOUNTER — Ambulatory Visit: Payer: Medicare Other | Admitting: Physical Therapy

## 2015-04-08 ENCOUNTER — Encounter: Payer: Self-pay | Admitting: Physical Therapy

## 2015-04-08 DIAGNOSIS — R531 Weakness: Secondary | ICD-10-CM | POA: Diagnosis not present

## 2015-04-08 DIAGNOSIS — M25662 Stiffness of left knee, not elsewhere classified: Secondary | ICD-10-CM | POA: Diagnosis not present

## 2015-04-08 DIAGNOSIS — M7989 Other specified soft tissue disorders: Secondary | ICD-10-CM

## 2015-04-08 DIAGNOSIS — R269 Unspecified abnormalities of gait and mobility: Secondary | ICD-10-CM

## 2015-04-08 DIAGNOSIS — M25562 Pain in left knee: Secondary | ICD-10-CM | POA: Diagnosis not present

## 2015-04-08 NOTE — Therapy (Signed)
[facility] [address] [place], [place], [address] Phone: [phone]   Fax:  [phone]  Physical Therapy Treatment  Patient Details  Name: FRUMA AFRICA MRN: [id number] Date of Birth: [birth year] Referring Provider: Ollen Gross, MD  Encounter Date: 04/08/2015      PT End of Session - 04/08/15 0924    Visit Number 9   Number of Visits 10   Date for PT Re-Evaluation 05/22/15   PT Start Time 0844   PT Stop Time 0946   PT Time Calculation (min) 62 min   Activity Tolerance Patient tolerated treatment well   Behavior During Therapy [facility] for tasks assessed/performed      Past Medical History  Diagnosis Date  . PONV (postoperative nausea and vomiting)   . Bladder incontinence   . OSA on CPAP   . Arthritis     "qwhere; feet, knees, neck" (04/21/2012)  . Chronic back pain   . Closed angle glaucoma     "both eyes" (04/21/2012)  . Prediabetes   . Urinary tract bacterial infections   . Overactive bladder   . Numbness     fingertips bilat and feet bilat   . Peripheral neuropathy (HCC)     feet bilat   . Difficult intubation     with intubation on November 26, 2014  . Pneumonia     hx. of as child  . Anemia     Past Surgical History  Procedure Laterality Date  . Back surgery    . Knee arthroplasty  06/2008    "slipped on ice; torn ligaments & cartilage" (04/21/2012)  . Posterior lumbar fusion  04/21/2012  . Tonsillectomy and adenoidectomy  ~ 1950  . Abdominal hysterectomy  1980    "partial" (04/21/2012)  . Bladder suspension  1990  . Anterior fusion cervical spine  12/2005  . Tubal ligation  1970's  . Dilation and curettage of uterus  1980  . Appendectomy    . 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 Patellar tendon repair Left 12/10/2014    Procedure: REPAIR PARTIAL  QUAD TENDON TEAR;  Surgeon: Ollen Gross, MD;  [facility]: [facility];  Service: Orthopedics;  Laterality: Left;  . Knee closed reduction Left 03/25/2015    Procedure: CLOSED MANIPULATION LEFT KNEE;  Surgeon: Ollen Gross, MD;  Location: [facility];  Service: Orthopedics;  Laterality: Left;    There were no vitals filed for this visit.  Visit Diagnosis:  Stiffness of left knee  Left knee pain  Weakness  Abnormality of gait  Swelling of limb      Subjective Assessment - 04/08/15 0903    Subjective Pt reports had uses her SPC and is weaning away from the crutches and walker, reports this Saturday she felt really improvement due to less tightness in left thight.    Currently in Pain? Yes   Pain Score 2    Pain Location Knee   Pain Orientation Left   Pain Descriptors / Indicators Sore   Pain Type Surgical pain   Pain Onset More than a month ago   Pain Frequency Intermittent   Multiple Pain Sites No            [facility] PT Assessment - 04/08/15 0001    Assessment   Medical Diagnosis S/P left total knee arthroplasty, wound debridement, Lt knee  manipulation    Onset Date/Surgical Date 03/25/15  manipulation surgery   Next MD Visit 03/19/15   Prior Therapy home health PT   Precautions   Precautions Fall   Balance Screen   Has the patient fallen in the past 6 months No   Has the patient had a decrease in activity level because of a fear of falling?  No   Is the patient reluctant to leave their home because of a fear of falling?  No   Home Environment   Living Environment Private residence   Living Arrangements Spouse/significant other   Type of Home House   Home Access Stairs to enter   Entrance Stairs-Number of Steps 2   Home Layout Multi-level  3 leels   Alternate Level Stairs-Number of Steps 15   Alternate Level Stairs-Rails Can reach both   Prior Function   Level of Independence Independent with household mobility with device;Independent with basic ADLs   Vocation Retired    Insurance claims handlerLeisure sew   Cognition   Overall Cognitive Status Within Functional Limits for tasks assessed   ROM / Strength   AROM / PROM / Strength AROM   AROM   AROM Assessment Site Knee   Right/Left Knee Left   Left Knee Extension 0   Left Knee Flexion 55   PROM   PROM Assessment Site Knee   Right/Left Knee Left   Left Knee Extension 0   Left Knee Flexion 60   Palpation   Patella mobility limited Lt patellar mobility in all directions on the Lt  due to swelling   Palpation comment pitting edema in the Lt LE about the knee and ankle, warmth about the Lt knee joint   Ambulation/Gait   Ambulation/Gait Yes   Ambulation/Gait Assistance 6: Modified independent (Device/Increase time)   Assistive device R Axillary Crutch   Gait Pattern Step-to pattern;Decreased hip/knee flexion - left  good extension with ambulation                     [facility] Adult PT Treatment/Exercise - 04/08/15 0001    Exercises   Exercises Knee/Hip   Knee/Hip Exercises: Stretches   Quad Stretch 3 reps;20 seconds  at 8 inch step   Knee/Hip Exercises: Aerobic   Nustep for bending Level 1x 11  Seat & arms # 10   Knee/Hip Exercises: Standing   Rebounder weightshift in 3 directions x 1 min each   Knee/Hip Exercises: Seated   Heel Slides AROM;10 reps  with 5 sec hold   Knee/Hip Exercises: Supine   Quad Sets Other (comment);2 sets   Heel Slides AROM;Left   Modalities   Modalities Vasopneumatic;Ultrasound   Ultrasound   Ultrasound Location Lt med thight   Ultrasound Parameters 100%, 1Mhz, 1 W/cm,    Ultrasound Goals Edema   Vasopneumatic   Number Minutes Vasopneumatic  15 minutes   Vasopnuematic Location  Knee   Vasopneumatic Pressure Medium   Vasopneumatic Temperature  3 snowflakes   Manual Therapy   Manual therapy comments PROM into flexion to incr ROM                   PT Short Term Goals - 04/08/15 0931    PT SHORT TERM GOAL #1   Title be independent in initial HEP   Time 3    Period Weeks   Status Achieved   PT SHORT TERM GOAL #2   Title demonstrate Lt knee AROM flexion > or = to 75 degrees  as of 04/08/15  55degrees   Time 3   Period Weeks   Status On-going           PT Long Term Goals - 04/08/15 0932    PT LONG TERM GOAL #1   Title be independent in advanced HEP   Time 8   Period Weeks   Status On-going   PT LONG TERM GOAL #2   Title improve FOTO to < or = to 48% limitaiton   Time 8   Period Weeks   Status On-going   PT LONG TERM GOAL #3   Title demonstrate Lt knee AROM flexion to > or = to 95 degrees to improve sitting   Time 8   Period Weeks   Status On-going   PT LONG TERM GOAL #4   Title demonstrate 4+/5 Lt hip strength to improve gait   Time 8   Period Weeks   Status On-going   PT LONG TERM GOAL #5   Title wean from crutch and demonstrate Lt knee flexion with swing through phase of gait on level surface   Time 8   Period Weeks   Status On-going   PT LONG TERM GOAL #6   Title --   PT LONG TERM GOAL #7   Title --               Plan - 04/08/15 0925    Clinical Impression Statement Pt reports since last Saturday she is using her SPC due to feeling more confidence with ambulation. Pt reports sitting is getting easier and transfers from the tiolett are improvifng, all over rates her improvement as 30-35%. Pt with less swelling in left thigh compare to last week.. Pt will conitnue to benefit from skilled PT   Pt will benefit from skilled therapeutic intervention in order to improve on the following deficits Abnormal gait;Decreased activity tolerance;Difficulty walking;Increased edema;Impaired flexibility;Decreased strength;Hypomobility;Decreased range of motion;Decreased endurance;Decreased mobility;Decreased scar mobility;Increased muscle spasms;Pain;Increased fascial restricitons   Rehab Potential Excellent   Clinical Impairments Affecting Rehab Potential Lt knee manipulation TKA replacement in July 2016, Patellatendon rupture,  Haematoma, open wound,    PT Frequency 3x / week   PT Duration 8 weeks   PT Treatment/Interventions ADLs/Self Care Home Management;Cryotherapy;Electrical Stimulation;Gait training;Ultrasound;Moist Heat;Stair training;Functional mobility training;Therapeutic activities;Therapeutic exercise;Neuromuscular re-education;Manual techniques;Patient/family education;Scar mobilization;Passive range of motion;Taping;Vasopneumatic Device   PT Next Visit Plan See what MD has to say. Continue [place], quad sets and hip strength, quad stretching, Edema control   Consulted and Agree with Plan of Care Patient        Problem List Patient Active Problem List   Diagnosis Date Noted  . Arthrofibrosis of total knee arthroplasty (HCC) 03/25/2015  . Quadriceps tendon rupture 12/09/2014  . OA (osteoarthritis) of knee 11/26/2014    NAUMANN-HOUEGNIFIO,Keoni Havey PTA 04/08/2015, 2:12 PM  [facility] [address], [address] [place], [place], [address] Phone: [phone]   Fax:  [phone]  Name: SOPHEAP BASIC MRN: [id number] Date of Birth: Sep 02, 1940

## 2015-04-09 DIAGNOSIS — Z96652 Presence of left artificial knee joint: Secondary | ICD-10-CM | POA: Diagnosis not present

## 2015-04-09 DIAGNOSIS — Z471 Aftercare following joint replacement surgery: Secondary | ICD-10-CM | POA: Diagnosis not present

## 2015-04-10 ENCOUNTER — Ambulatory Visit: Payer: Medicare Other

## 2015-04-10 DIAGNOSIS — M7989 Other specified soft tissue disorders: Secondary | ICD-10-CM | POA: Diagnosis not present

## 2015-04-10 DIAGNOSIS — R269 Unspecified abnormalities of gait and mobility: Secondary | ICD-10-CM

## 2015-04-10 DIAGNOSIS — R531 Weakness: Secondary | ICD-10-CM

## 2015-04-10 DIAGNOSIS — M25562 Pain in left knee: Secondary | ICD-10-CM

## 2015-04-10 DIAGNOSIS — M25662 Stiffness of left knee, not elsewhere classified: Secondary | ICD-10-CM | POA: Diagnosis not present

## 2015-04-10 NOTE — Therapy (Signed)
[facility] [address] [place], [place], [address] Phone: [phone]   Fax:  [phone]  Physical Therapy Treatment  Patient Details  Name: Lauren English MRN: [id number] Date of Birth: Dec 30, 1940 Referring Provider: Ollen Gross, MD  Encounter Date: 04/10/2015      PT End of Session - 04/10/15 0923    Visit Number 10   Number of Visits 20   Date for PT Re-Evaluation 05/22/15   PT Start Time 0844   PT Stop Time 0940   PT Time Calculation (min) 56 min   Activity Tolerance Patient tolerated treatment well   Behavior During Therapy [facility] for tasks assessed/performed      Past Medical History  Diagnosis Date  . PONV (postoperative nausea and vomiting)   . Bladder incontinence   . OSA on CPAP   . Arthritis     "qwhere; feet, knees, neck" (04/21/2012)  . Chronic back pain   . Closed angle glaucoma     "both eyes" (04/21/2012)  . Prediabetes   . Urinary tract bacterial infections   . Overactive bladder   . Numbness     fingertips bilat and feet bilat   . Peripheral neuropathy (HCC)     feet bilat   . Difficult intubation     with intubation on November 26, 2014  . Pneumonia     hx. of as child  . Anemia     Past Surgical History  Procedure Laterality Date  . Back surgery    . Knee arthroplasty  06/2008    "slipped on ice; torn ligaments & cartilage" (04/21/2012)  . Posterior lumbar fusion  04/21/2012  . Tonsillectomy and adenoidectomy  ~ 1950  . Abdominal hysterectomy  1980    "partial" (04/21/2012)  . Bladder suspension  1990  . Anterior fusion cervical spine  12/2005  . Tubal ligation  1970's  . Dilation and curettage of uterus  1980  . Appendectomy    . 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 Patellar tendon repair Left 12/10/2014    Procedure: REPAIR  PARTIAL QUAD TENDON TEAR;  Surgeon: Ollen Gross, MD;  Location: [facility];  Service: Orthopedics;  Laterality: Left;  . Knee closed reduction Left 03/25/2015    Procedure: CLOSED MANIPULATION LEFT KNEE;  Surgeon: Ollen Gross, MD;  Location: [facility];  Service: Orthopedics;  Laterality: Left;    There were no vitals filed for this visit.  Visit Diagnosis:  Left knee pain  Weakness  Abnormality of gait  Swelling of limb      Subjective Assessment - 04/10/15 0850    Subjective My knee feels stiff this morning   Currently in Pain? Yes   Pain Score 3    Pain Location Knee   Pain Orientation Left   Pain Descriptors / Indicators Sore;Tightness   Pain Type Surgical pain            [facility] PT Assessment - 04/10/15 0001    Assessment   Medical Diagnosis S/P left total knee arthroplasty, wound debridement, Lt knee manipulation    Onset Date/Surgical Date 03/25/15  manipulation surgery   Next MD Visit 04/30/15   Precautions   Precautions Fall   Cognition   Overall Cognitive Status Within Functional Limits for tasks assessed   ROM / Strength   AROM / PROM /  Strength AROM;PROM   AROM   AROM Assessment Site Knee   Right/Left Knee Left   Left Knee Extension 0   Left Knee Flexion 54   PROM   PROM Assessment Site Knee   Right/Left Knee Left   Left Knee Extension 0   Left Knee Flexion 60   Palpation   Palpation comment pitting edema in the Lt LE about the knee and ankle, warmth about the Lt knee joint                     [facility] Adult PT Treatment/Exercise - 04/10/15 0001    Knee/Hip Exercises: Stretches   Quad Stretch Left;10 seconds  10 reps using 6" step   Other Knee/Hip Stretches seated knee flexion with overpressure 10x 10 seconds   Knee/Hip Exercises: Aerobic   Nustep for bending Level 1x 10  Seat & arms # 10   Modalities   Modalities Vasopneumatic;Ultrasound   Vasopneumatic   Number Minutes Vasopneumatic  15 minutes   Vasopnuematic Location  Knee    Vasopneumatic Pressure Medium   Vasopneumatic Temperature  3 snowflakes   Manual Therapy   Manual Therapy Soft tissue mobilization;Edema management;Passive ROM   Manual therapy comments PROM into flexion to incr ROM    Edema Management retrograde massage for edema   Passive ROM into flexion as tolerated                  PT Short Term Goals - 04/10/15 [id number]    PT SHORT TERM GOAL #2   Title demonstrate Lt knee AROM flexion > or = to 75 degrees   Time 3   Period Weeks   Status On-going           PT Long Term Goals - 04/08/15 0932    PT LONG TERM GOAL #1   Title be independent in advanced HEP   Time 8   Period Weeks   Status On-going   PT LONG TERM GOAL #2   Title improve FOTO to < or = to 48% limitaiton   Time 8   Period Weeks   Status On-going   PT LONG TERM GOAL #3   Title demonstrate Lt knee AROM flexion to > or = to 95 degrees to improve sitting   Time 8   Period Weeks   Status On-going   PT LONG TERM GOAL #4   Title demonstrate 4+/5 Lt hip strength to improve gait   Time 8   Period Weeks   Status On-going   PT LONG TERM GOAL #5   Title wean from crutch and demonstrate Lt knee flexion with swing through phase of gait on level surface   Time 8   Period Weeks   Status On-going   PT LONG TERM GOAL #6   Title --   PT LONG TERM GOAL #7   Title --               Plan - 04/10/15 0854    Clinical Impression Statement Pt is now using single point cane for all ambulation.  Pt with significant Lt knee AROM deficits although this is slowly improving s/p manipulation surgery.  Gait is antalgic due to limited AROM with swing through phase of gait.  Pt with pitting edema in the Lt LE.  FOTO score is 61% limitation.  Pt will continue to benefit from skilled PT for Lt knee AROM, edema management, gait and strength progression.     Pt will benefit  from skilled therapeutic intervention in order to improve on the following deficits Abnormal gait;Decreased activity  tolerance;Difficulty walking;Increased edema;Impaired flexibility;Decreased strength;Hypomobility;Decreased range of motion;Decreased endurance;Decreased mobility;Decreased scar mobility;Increased muscle spasms;Pain;Increased fascial restricitons   Rehab Potential Excellent   Clinical Impairments Affecting Rehab Potential Lt knee manipulation TKA replacement in July 2016, Patellatendon rupture, Haematoma, open wound,    PT Frequency 3x / week   PT Duration 8 weeks   PT Treatment/Interventions ADLs/Self Care Home Management;Cryotherapy;Electrical Stimulation;Gait training;Ultrasound;Moist Heat;Stair training;Functional mobility training;Therapeutic activities;Therapeutic exercise;Neuromuscular re-education;Manual techniques;Patient/family education;Scar mobilization;Passive range of motion;Taping;Vasopneumatic Device   PT Next Visit Plan Lt knee AROM progression, edema management, gait, strength   Consulted and Agree with Plan of Care Patient          G-Codes - 2015/04/26 0906    Functional Assessment Tool Used FOTO: 61% limitation   Functional Limitation Mobility: Walking and moving around   Mobility: Walking and Moving Around Current Status [phone]) At least 60 percent but less than 80 percent impaired, limited or restricted   Mobility: Walking and Moving Around Goal Status [phone]) At least 40 percent but less than 60 percent impaired, limited or restricted      Problem List Patient Active Problem List   Diagnosis Date Noted  . Arthrofibrosis of total knee arthroplasty (HCC) 03/25/2015  . Quadriceps tendon rupture 12/09/2014  . OA (osteoarthritis) of knee 11/26/2014   Physical Therapy Progress Note  Dates of Reporting Period: 03/04/15 to 04/26/15  Objective Reports of Subjective Statement: Pt reports that is is getting easier to bend the knee.  25% better.    Objective Measurements: See above.   Goal Update: See above.  Limited AROM, gait abnormality and limited strength s/p  manipulation sugery.    Plan: Continue to improve Lt knee AROM, reduce edema, gait training and strength.    Reason Skilled Services are Required: Pt with significant Lt knee AROM deficits s/p TKA and manipulation surgery.  Pt with gait abnormality and limited tolerance for standing activity. Pt will benefit from skilled PT to return to prior level of function at home and in the community.    TAKACS,KELLY, PT 04/26/2015, 9:26 AM  [facility] [address] [place], [place], [address] Phone: [phone]   Fax:  [phone]  Name: Lauren English MRN: [id number] Date of Birth: 01-28-1941

## 2015-04-12 ENCOUNTER — Encounter: Payer: Self-pay | Admitting: Physical Therapy

## 2015-04-12 ENCOUNTER — Ambulatory Visit: Payer: Medicare Other | Attending: Orthopedic Surgery | Admitting: Physical Therapy

## 2015-04-12 DIAGNOSIS — M7989 Other specified soft tissue disorders: Secondary | ICD-10-CM | POA: Diagnosis not present

## 2015-04-12 DIAGNOSIS — R269 Unspecified abnormalities of gait and mobility: Secondary | ICD-10-CM

## 2015-04-12 DIAGNOSIS — M25562 Pain in left knee: Secondary | ICD-10-CM | POA: Insufficient documentation

## 2015-04-12 DIAGNOSIS — R531 Weakness: Secondary | ICD-10-CM | POA: Diagnosis not present

## 2015-04-12 DIAGNOSIS — M25662 Stiffness of left knee, not elsewhere classified: Secondary | ICD-10-CM

## 2015-04-12 NOTE — Therapy (Signed)
[facility] [address] [place], [place], [address] Phone: [phone]   Fax:  [phone]  Physical Therapy Treatment  Patient Details  Name: Lauren English MRN: [id number] Date of Birth: February 02, 1941 Referring Provider: Ollen GrossAluisio, Frank, MD  Encounter Date: 04/12/2015      PT End of Session - 04/12/15 0855    Visit Number 11   Number of Visits 20   Date for PT Re-Evaluation 05/22/15   PT Start Time 0847   PT Stop Time 0946   PT Time Calculation (min) 59 min   Activity Tolerance Patient tolerated treatment well   Behavior During Therapy [facility] for tasks assessed/performed      Past Medical History  Diagnosis Date  . PONV (postoperative nausea and vomiting)   . Bladder incontinence   . OSA on CPAP   . Arthritis     "qwhere; feet, knees, neck" (04/21/2012)  . Chronic back pain   . Closed angle glaucoma     "both eyes" (04/21/2012)  . Prediabetes   . Urinary tract bacterial infections   . Overactive bladder   . Numbness     fingertips bilat and feet bilat   . Peripheral neuropathy (HCC)     feet bilat   . Difficult intubation     with intubation on November 26, 2014  . Pneumonia     hx. of as child  . Anemia     Past Surgical History  Procedure Laterality Date  . Back surgery    . Knee arthroplasty  06/2008    "slipped on ice; torn ligaments & cartilage" (04/21/2012)  . Posterior lumbar fusion  04/21/2012  . Tonsillectomy and adenoidectomy  ~ 1950  . Abdominal hysterectomy  1980    "partial" (04/21/2012)  . Bladder suspension  1990  . Anterior fusion cervical spine  12/2005  . Tubal ligation  1970's  . Dilation and curettage of uterus  1980  . Appendectomy    . 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 Patellar tendon repair Left 12/10/2014    Procedure: REPAIR PARTIAL  QUAD TENDON TEAR;  Surgeon: Ollen GrossFrank Aluisio, MD;  Location: [facility];  Service: Orthopedics;  Laterality: Left;  . Knee closed reduction Left 03/25/2015    Procedure: CLOSED MANIPULATION LEFT KNEE;  Surgeon: Ollen GrossFrank Aluisio, MD;  Location: [facility];  Service: Orthopedics;  Laterality: Left;    There were no vitals filed for this visit.  Visit Diagnosis:  Left knee pain  Weakness  Abnormality of gait  Swelling of limb  Stiffness of left knee      Subjective Assessment - 04/12/15 0854    Subjective My knee had increased swelling yesterday, elevation and ice helped. Today, no complain of pain in left knee, premedicated.    Currently in Pain? No/denies                         [facility] Adult PT Treatment/Exercise - 04/12/15 0001    Exercises   Exercises Knee/Hip   Knee/Hip Exercises: Stretches   Quad Stretch Left;10 seconds;2 reps  10reps using 6" step   Knee/Hip Exercises: Aerobic   Nustep for bending Level 1x 10  seat and arms #10   Knee/Hip Exercises: Standing   Other Standing Knee Exercises squatting in available range with B UE holding  on stairs, chair behind  2 x10, incr flexibility during 2nd set   Knee/Hip Exercises: Supine   Heel Slides AROM;Left;2 sets;10 reps   Knee Flexion 2 sets;10 reps  with hip flexion 90 into dynamic flexion left knee   Modalities   Modalities Vasopneumatic;Ultrasound   Vasopneumatic   Number Minutes Vasopneumatic  15 minutes   Vasopnuematic Location  Knee   Vasopneumatic Pressure Medium   Vasopneumatic Temperature  3 snowflakes   Manual Therapy   Manual Therapy Soft tissue mobilization;Edema management;Passive ROM   Edema Management retrograde massage for edema                  PT Short Term Goals - 04/10/15 5784    PT SHORT TERM GOAL #2   Title demonstrate Lt knee AROM flexion > or = to 75 degrees   Time 3   Period Weeks   Status On-going           PT Long Term Goals - 04/08/15 0932    PT LONG TERM GOAL #1    Title be independent in advanced HEP   Time 8   Period Weeks   Status On-going   PT LONG TERM GOAL #2   Title improve FOTO to < or = to 48% limitaiton   Time 8   Period Weeks   Status On-going   PT LONG TERM GOAL #3   Title demonstrate Lt knee AROM flexion to > or = to 95 degrees to improve sitting   Time 8   Period Weeks   Status On-going   PT LONG TERM GOAL #4   Title demonstrate 4+/5 Lt hip strength to improve gait   Time 8   Period Weeks   Status On-going   PT LONG TERM GOAL #5   Title wean from crutch and demonstrate Lt knee flexion with swing through phase of gait on level surface   Time 8   Period Weeks   Status On-going   PT LONG TERM GOAL #6   Title --   PT LONG TERM GOAL #7   Title --               Plan - 04/12/15 0856    Clinical Impression Statement Pt continues to improve in all areas, but slow progress as to be expected due to patients med history with left knee. Pt ambulates with SPC and presents with antalgic gait. Pt with hematome in left med thight and  increase welling. pt will conitnue to benefit from skilled PT for Lt knee AROM, edema managment, gait and strength progression.   Rehab Potential Excellent   Clinical Impairments Affecting Rehab Potential Lt knee manipulation TKA replacement in July 2016, Patellatendon rupture, Haematoma, open wound,    PT Frequency 3x / week   PT Duration 8 weeks   PT Treatment/Interventions ADLs/Self Care Home Management;Cryotherapy;Electrical Stimulation;Gait training;Ultrasound;Moist Heat;Stair training;Functional mobility training;Therapeutic activities;Therapeutic exercise;Neuromuscular re-education;Manual techniques;Patient/family education;Scar mobilization;Passive range of motion;Taping;Vasopneumatic Device   PT Next Visit Plan Lt knee AROM progression, edema management, gait, strength   Consulted and Agree with Plan of Care Patient        Problem List Patient Active Problem List   Diagnosis Date  Noted  . Arthrofibrosis of total knee arthroplasty (HCC) 03/25/2015  . Quadriceps tendon rupture 12/09/2014  . OA (osteoarthritis) of knee 11/26/2014    NAUMANN-HOUEGNIFIO,Anush Wiedeman PTA 04/12/2015, 9:41 AM  [facility] [address] [place], [place], [address] Phone: [phone]  Fax:  [phone]  Name: Lauren English MRN: [id number] Date of Birth: 08-19-1940

## 2015-04-15 ENCOUNTER — Ambulatory Visit: Payer: Medicare Other

## 2015-04-15 DIAGNOSIS — M7989 Other specified soft tissue disorders: Secondary | ICD-10-CM

## 2015-04-15 DIAGNOSIS — M25562 Pain in left knee: Secondary | ICD-10-CM

## 2015-04-15 DIAGNOSIS — R269 Unspecified abnormalities of gait and mobility: Secondary | ICD-10-CM

## 2015-04-15 DIAGNOSIS — R531 Weakness: Secondary | ICD-10-CM | POA: Diagnosis not present

## 2015-04-15 DIAGNOSIS — M25662 Stiffness of left knee, not elsewhere classified: Secondary | ICD-10-CM

## 2015-04-15 NOTE — Therapy (Signed)
[facility] [address], [address] [place], [place], [address] Phone: [phone]   Fax:  [phone]  Physical Therapy Treatment  Patient Details  Name: Lauren English MRN: [id number] Date of Birth: 1940/09/11 Referring Provider: Ollen Gross, MD  Encounter Date: 04/15/2015      PT End of Session - 04/15/15 0841    Visit Number 12   Number of Visits 20   Date for PT Re-Evaluation 05/22/15   PT Start Time 0757   PT Stop Time 0857   PT Time Calculation (min) 60 min   Activity Tolerance Patient tolerated treatment well   Behavior During Therapy [facility] for tasks assessed/performed      Past Medical History  Diagnosis Date  . PONV (postoperative nausea and vomiting)   . Bladder incontinence   . OSA on CPAP   . Arthritis     "qwhere; feet, knees, neck" (04/21/2012)  . Chronic back pain   . Closed angle glaucoma     "both eyes" (04/21/2012)  . Prediabetes   . Urinary tract bacterial infections   . Overactive bladder   . Numbness     fingertips bilat and feet bilat   . Peripheral neuropathy (HCC)     feet bilat   . Difficult intubation     with intubation on November 26, 2014  . Pneumonia     hx. of as child  . Anemia     Past Surgical History  Procedure Laterality Date  . Back surgery    . Knee arthroplasty  06/2008    "slipped on ice; torn ligaments & cartilage" (04/21/2012)  . Posterior lumbar fusion  04/21/2012  . Tonsillectomy and adenoidectomy  ~ 1950  . Abdominal hysterectomy  1980    "partial" (04/21/2012)  . Bladder suspension  1990  . Anterior fusion cervical spine  12/2005  . Tubal ligation  1970's  . Dilation and curettage of uterus  1980  . Appendectomy    . 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 Patellar tendon repair Left 12/10/2014    Procedure: REPAIR PARTIAL  QUAD TENDON TEAR;  Surgeon: Ollen Gross, MD;  Location: [facility];  Service: Orthopedics;  Laterality: Left;  . Knee closed reduction Left 03/25/2015    Procedure: CLOSED MANIPULATION LEFT KNEE;  Surgeon: Ollen Gross, MD;  Location: [facility];  Service: Orthopedics;  Laterality: Left;    There were no vitals filed for this visit.  Visit Diagnosis:  Left knee pain  Weakness  Abnormality of gait  Swelling of limb  Stiffness of left knee      Subjective Assessment - 04/15/15 0758    Subjective My thigh is sore.  Pt has been using heat on the thigh to help with pain.     How long can you sit comfortably? not able to bend knee with sitting   Currently in Pain? Yes   Pain Score 5    Pain Location Leg  thigh   Pain Orientation Left   Pain Descriptors / Indicators Sore;Tightness   Pain Type Surgical pain   Pain Onset More than a month ago   Pain Frequency Intermittent   Aggravating Factors  pain with bending knee, walking   Pain Relieving Factors rest, ice, not bending the knee.            [facility] PT  Assessment - 04/15/15 0001    Observation/Other Assessments   Skin Integrity Pt with new pocket of edema and skin is red over Lt lateral knee joint.  Pt reports this has been swollen and oozing in the past.  Pt advised to call MD to discuss.     PROM   PROM Assessment Site Knee   Right/Left Knee Left   Left Knee Flexion 57                     [facility] Adult PT Treatment/Exercise - 04/15/15 0001    Knee/Hip Exercises: Stretches   LobbyistQuad Stretch Left;10 seconds;2 reps  10reps using 6" step   Knee/Hip Exercises: Aerobic   Nustep for bending Level 1x 10  seat and arms #10   Knee/Hip Exercises: Standing   Other Standing Knee Exercises squatting in available range with B UE holding on stairs, chair behind  2 x10, incr flexibility during 2nd set   Knee/Hip Exercises: Supine   Heel Slides AROM;Left;2 sets;10 reps   Modalities   Modalities Vasopneumatic   Vasopneumatic    Number Minutes Vasopneumatic  15 minutes   Vasopnuematic Location  Knee   Vasopneumatic Pressure Medium   Vasopneumatic Temperature  3 snowflakes   Manual Therapy   Manual Therapy Soft tissue mobilization;Edema management;Passive ROM   Manual therapy comments PROM into flexion to incr ROM    Edema Management retrograde massage for edema                  PT Short Term Goals - 04/15/15 0806    PT SHORT TERM GOAL #1   Title be independent in initial HEP   Status Achieved   PT SHORT TERM GOAL #2   Title demonstrate Lt knee AROM flexion > or = to 75 degrees           PT Long Term Goals - 04/08/15 0932    PT LONG TERM GOAL #1   Title be independent in advanced HEP   Time 8   Period Weeks   Status On-going   PT LONG TERM GOAL #2   Title improve FOTO to < or = to 48% limitaiton   Time 8   Period Weeks   Status On-going   PT LONG TERM GOAL #3   Title demonstrate Lt knee AROM flexion to > or = to 95 degrees to improve sitting   Time 8   Period Weeks   Status On-going   PT LONG TERM GOAL #4   Title demonstrate 4+/5 Lt hip strength to improve gait   Time 8   Period Weeks   Status On-going   PT LONG TERM GOAL #5   Title wean from crutch and demonstrate Lt knee flexion with swing through phase of gait on level surface   Time 8   Period Weeks   Status On-going   PT LONG TERM GOAL #6   Title --   PT LONG TERM GOAL #7   Title --               Plan - 04/15/15 0807    Clinical Impression Statement Pt with slow progress regarding AROM.  Pt with continued Lt knee AROM deficits and significant swelling and pitting edema.  Pt with gait abnormality due to reduced knee flexion with swing through phase of gait.  Pt will continue to benefit from skilled PT for Lt knee AROM, edema managment, gait and strength progression.     Pt will  benefit from skilled therapeutic intervention in order to improve on the following deficits Abnormal gait;Decreased activity  tolerance;Difficulty walking;Increased edema;Impaired flexibility;Decreased strength;Hypomobility;Decreased range of motion;Decreased endurance;Decreased mobility;Decreased scar mobility;Increased muscle spasms;Pain;Increased fascial restricitons   Rehab Potential Excellent   Clinical Impairments Affecting Rehab Potential Lt knee manipulation TKA replacement in July 2016, Patellatendon rupture, Haematoma, open wound,    PT Frequency 3x / week   PT Duration 8 weeks   PT Treatment/Interventions ADLs/Self Care Home Management;Cryotherapy;Electrical Stimulation;Gait training;Ultrasound;Moist Heat;Stair training;Functional mobility training;Therapeutic activities;Therapeutic exercise;Neuromuscular re-education;Manual techniques;Patient/family education;Scar mobilization;Passive range of motion;Taping;Vasopneumatic Device   PT Next Visit Plan Lt knee AROM progression, edema management, gait, strength   Consulted and Agree with Plan of Care Patient        Problem List Patient Active Problem List   Diagnosis Date Noted  . Arthrofibrosis of total knee arthroplasty (HCC) 03/25/2015  . Quadriceps tendon rupture 12/09/2014  . OA (osteoarthritis) of knee 11/26/2014    TAKACS,KELLY, PT 04/15/2015, 8:45 AM  [facility] [address] [place], [place], [address] Phone: [phone]   Fax:  [phone]  Name: Lauren English MRN: [id number] Date of Birth: Jul 16, 1940

## 2015-04-17 ENCOUNTER — Ambulatory Visit: Payer: Medicare Other

## 2015-04-17 DIAGNOSIS — M25662 Stiffness of left knee, not elsewhere classified: Secondary | ICD-10-CM

## 2015-04-17 DIAGNOSIS — R531 Weakness: Secondary | ICD-10-CM | POA: Diagnosis not present

## 2015-04-17 DIAGNOSIS — R269 Unspecified abnormalities of gait and mobility: Secondary | ICD-10-CM | POA: Diagnosis not present

## 2015-04-17 DIAGNOSIS — M7989 Other specified soft tissue disorders: Secondary | ICD-10-CM | POA: Diagnosis not present

## 2015-04-17 DIAGNOSIS — M25562 Pain in left knee: Secondary | ICD-10-CM

## 2015-04-17 NOTE — Therapy (Signed)
[facility] [address], [address] [place], [place], [address] Phone: [phone]   Fax:  [phone]  Physical Therapy Treatment  Patient Details  Name: Lauren KinderJanet G Tauer MRN: [id number] Date of Birth: 31-Aug-1940 Referring Provider: Ollen GrossAluisio, Frank, MD  Encounter Date: 04/17/2015      PT End of Session - 04/17/15 0836    Visit Number 13   Number of Visits 20   Date for PT Re-Evaluation 05/22/15   PT Start Time 0757   PT Stop Time 0855   PT Time Calculation (min) 58 min   Activity Tolerance Patient tolerated treatment well   Behavior During Therapy [facility] for tasks assessed/performed      Past Medical History  Diagnosis Date  . PONV (postoperative nausea and vomiting)   . Bladder incontinence   . OSA on CPAP   . Arthritis     "qwhere; feet, knees, neck" (04/21/2012)  . Chronic back pain   . Closed angle glaucoma     "both eyes" (04/21/2012)  . Prediabetes   . Urinary tract bacterial infections   . Overactive bladder   . Numbness     fingertips bilat and feet bilat   . Peripheral neuropathy (HCC)     feet bilat   . Difficult intubation     with intubation on November 26, 2014  . Pneumonia     hx. of as child  . Anemia     Past Surgical History  Procedure Laterality Date  . Back surgery    . Knee arthroplasty  06/2008    "slipped on ice; torn ligaments & cartilage" (04/21/2012)  . Posterior lumbar fusion  04/21/2012  . Tonsillectomy and adenoidectomy  ~ 1950  . Abdominal hysterectomy  1980    "partial" (04/21/2012)  . Bladder suspension  1990  . Anterior fusion cervical spine  12/2005  . Tubal ligation  1970's  . Dilation and curettage of uterus  1980  . Appendectomy    . 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 Patellar tendon repair Left 12/10/2014    Procedure: REPAIR PARTIAL  QUAD TENDON TEAR;  Surgeon: Ollen GrossFrank Aluisio, MD;  Location: [facility];  Service: Orthopedics;  Laterality: Left;  . Knee closed reduction Left 03/25/2015    Procedure: CLOSED MANIPULATION LEFT KNEE;  Surgeon: Ollen GrossFrank Aluisio, MD;  Location: [facility];  Service: Orthopedics;  Laterality: Left;    There were no vitals filed for this visit.  Visit Diagnosis:  Left knee pain  Weakness  Abnormality of gait  Swelling of limb  Stiffness of left knee      Subjective Assessment - 04/17/15 0801    Subjective Spot on the thigh is looking better.  Contacted MD and he said it was fine and she should just watch it.     Currently in Pain? Yes   Pain Score 4    Pain Location Leg   Pain Orientation Left   Pain Descriptors / Indicators Sore;Tightness   Pain Type Surgical pain   Pain Onset More than a month ago   Pain Frequency Intermittent                         [facility] Adult PT Treatment/Exercise - 04/17/15 0001    Knee/Hip Exercises: Stretches   LobbyistQuad Stretch Left;10 seconds;2 reps  10reps  using 6" step   Knee/Hip Exercises: Aerobic   Nustep for bending Level 1x 10  seat and arms #10   Knee/Hip Exercises: Machines for Strengthening   Total Gym Leg Press 65# bil 3x10  seat 8   Knee/Hip Exercises: Standing   Rebounder weightshift in 3 directions x 1 min each   Other Standing Knee Exercises squatting in available range with B UE holding on stairs, chair behind  2 x10, incr flexibility during 2nd set   Vasopneumatic   Number Minutes Vasopneumatic  15 minutes   Vasopnuematic Location  Knee   Vasopneumatic Pressure Medium   Vasopneumatic Temperature  3 snowflakes   Manual Therapy   Manual Therapy Soft tissue mobilization;Edema management;Passive ROM   Manual therapy comments PROM into flexion to incr ROM    Edema Management retrograde massage for edema   Passive ROM into flexion as tolerated                  PT Short Term Goals - 04/15/15 0806    PT SHORT TERM GOAL  #1   Title be independent in initial HEP   Status Achieved   PT SHORT TERM GOAL #2   Title demonstrate Lt knee AROM flexion > or = to 75 degrees           PT Long Term Goals - 04/08/15 0932    PT LONG TERM GOAL #1   Title be independent in advanced HEP   Time 8   Period Weeks   Status On-going   PT LONG TERM GOAL #2   Title improve FOTO to < or = to 48% limitaiton   Time 8   Period Weeks   Status On-going   PT LONG TERM GOAL #3   Title demonstrate Lt knee AROM flexion to > or = to 95 degrees to improve sitting   Time 8   Period Weeks   Status On-going   PT LONG TERM GOAL #4   Title demonstrate 4+/5 Lt hip strength to improve gait   Time 8   Period Weeks   Status On-going   PT LONG TERM GOAL #5   Title wean from crutch and demonstrate Lt knee flexion with swing through phase of gait on level surface   Time 8   Period Weeks   Status On-going   PT LONG TERM GOAL #6   Title --   PT LONG TERM GOAL #7   Title --               Plan - 04/17/15 0820    Clinical Impression Statement Pt with slow progress regarding AROM of LT knee.  Edema is better today vs last session.  Pt with gati abnormality due to reduced knee flexion with swing through phase of gait.  Pt tolerated leg press well today. Pt will continue to beneif from skilled PT for Lt knee AROM, edema management, gait and strength progression.     Pt will benefit from skilled therapeutic intervention in order to improve on the following deficits Abnormal gait;Decreased activity tolerance;Difficulty walking;Increased edema;Impaired flexibility;Decreased strength;Hypomobility;Decreased range of motion;Decreased endurance;Decreased mobility;Decreased scar mobility;Increased muscle spasms;Pain;Increased fascial restricitons   Rehab Potential Excellent   PT Frequency 3x / week   PT Duration 8 weeks   PT Treatment/Interventions ADLs/Self Care Home Management;Cryotherapy;Electrical Stimulation;Gait  training;Ultrasound;Moist Heat;Stair training;Functional mobility training;Therapeutic activities;Therapeutic exercise;Neuromuscular re-education;Manual techniques;Patient/family education;Scar mobilization;Passive range of motion;Taping;Vasopneumatic Device   PT Next Visit Plan Lt knee AROM progression, edema management, gait, strength  Consulted and Agree with Plan of Care Patient        Problem List Patient Active Problem List   Diagnosis Date Noted  . Arthrofibrosis of total knee arthroplasty (HCC) 03/25/2015  . Quadriceps tendon rupture 12/09/2014  . OA (osteoarthritis) of knee 11/26/2014    Dwain Huhn, PT 04/17/2015, 8:42 AM  [facility] [address] [place], [place], [address] Phone: [phone]   Fax:  [phone]  Name: DEVERY ODWYER MRN: [id number] Date of Birth: [birth year]

## 2015-04-19 ENCOUNTER — Ambulatory Visit: Payer: Medicare Other | Admitting: Physical Therapy

## 2015-04-19 ENCOUNTER — Encounter: Payer: Self-pay | Admitting: Physical Therapy

## 2015-04-19 DIAGNOSIS — M25562 Pain in left knee: Secondary | ICD-10-CM

## 2015-04-19 DIAGNOSIS — R269 Unspecified abnormalities of gait and mobility: Secondary | ICD-10-CM

## 2015-04-19 DIAGNOSIS — M7989 Other specified soft tissue disorders: Secondary | ICD-10-CM | POA: Diagnosis not present

## 2015-04-19 DIAGNOSIS — M25662 Stiffness of left knee, not elsewhere classified: Secondary | ICD-10-CM

## 2015-04-19 DIAGNOSIS — R531 Weakness: Secondary | ICD-10-CM | POA: Diagnosis not present

## 2015-04-19 NOTE — Therapy (Signed)
[facility] [address] [place], [place], [address] Phone: [phone]   Fax:  [phone]  Physical Therapy Treatment  Patient Details  Name: Lauren English MRN: [id number] Date of Birth: [birth year] Referring Provider: Ollen Gross, MD  Encounter Date: 04/19/2015      PT End of Session - 04/19/15 0908    Visit Number 14   Number of Visits 20   Date for PT Re-Evaluation 05/22/15   PT Start Time 0842   PT Stop Time 0946   PT Time Calculation (min) 64 min   Activity Tolerance Patient tolerated treatment well   Behavior During Therapy [facility] for tasks assessed/performed      Past Medical History  Diagnosis Date  . PONV (postoperative nausea and vomiting)   . Bladder incontinence   . OSA on CPAP   . Arthritis     "qwhere; feet, knees, neck" (04/21/2012)  . Chronic back pain   . Closed angle glaucoma     "both eyes" (04/21/2012)  . Prediabetes   . Urinary tract bacterial infections   . Overactive bladder   . Numbness     fingertips bilat and feet bilat   . Peripheral neuropathy (HCC)     feet bilat   . Difficult intubation     with intubation on November 26, 2014  . Pneumonia     hx. of as child  . Anemia     Past Surgical History  Procedure Laterality Date  . Back surgery    . Knee arthroplasty  06/2008    "slipped on ice; torn ligaments & cartilage" (04/21/2012)  . Posterior lumbar fusion  04/21/2012  . Tonsillectomy and adenoidectomy  ~ 1950  . Abdominal hysterectomy  1980    "partial" (04/21/2012)  . Bladder suspension  1990  . Anterior fusion cervical spine  12/2005  . Tubal ligation  1970's  . Dilation and curettage of uterus  1980  . Appendectomy    . 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 Patellar tendon repair Left 12/10/2014    Procedure: REPAIR PARTIAL  QUAD TENDON TEAR;  Surgeon: Ollen Gross, MD;  Location: [facility];  Service: Orthopedics;  Laterality: Left;  . Knee closed reduction Left 03/25/2015    Procedure: CLOSED MANIPULATION LEFT KNEE;  Surgeon: Ollen Gross, MD;  Location: [facility];  Service: Orthopedics;  Laterality: Left;    There were no vitals filed for this visit.  Visit Diagnosis:  Left knee pain  Weakness  Abnormality of gait  Swelling of limb  Stiffness of left knee      Subjective Assessment - 04/19/15 0903    Subjective Spot on lat side of thigh is looking, but med side of knee is very senitive to touch, red and hot. Pt reports she send a picture to the MD and he is not concerned   Patient is accompained by: Family member   Limitations Sitting   How long can you sit comfortably? not able to bend knee with sitting   How long can you stand comfortably? 30 minutes   How long can you walk comfortably? pt is using a crutch   Patient Stated Goals bend the knee, walk without crutch   Currently in Pain? Yes   Pain Score 4    Pain Location Leg   Pain Orientation Left  Pain Descriptors / Indicators Sore;Tightness   Pain Type Surgical pain   Pain Onset More than a month ago   Pain Frequency Intermittent   Multiple Pain Sites No                         [facility] Adult PT Treatment/Exercise - 04/19/15 0001    Exercises   Exercises Knee/Hip   Knee/Hip Exercises: Stretches   Quad Stretch Left;10 seconds;2 reps  dynamic for 1 min, and 3 x with 30 sec hold   Knee/Hip Exercises: Aerobic   Nustep for bending Level 1x 10  seat and arms on 10   Knee/Hip Exercises: Machines for Strengthening   Total Gym Leg Press 65# bil 3x10  seat on #8, ROM left knee 55degrees on leg press   Knee/Hip Exercises: Standing   Rebounder weightshift in 3 directions x 1 min each   Other Standing Knee Exercises squatting in available range with B UE holding on stairs, chair behind  2x10   Modalities   Modalities  Vasopneumatic   Vasopneumatic   Number Minutes Vasopneumatic  15 minutes   Vasopnuematic Location  Knee   Vasopneumatic Pressure Medium   Vasopneumatic Temperature  3 snowflakes   Manual Therapy   Manual Therapy Soft tissue mobilization;Edema management;Passive ROM   Manual therapy comments contract relaxe 2 x 5 left knee   Edema Management retrograde massage for edema   Passive ROM into flexion as tolerated                  PT Short Term Goals - 04/15/15 0806    PT SHORT TERM GOAL #1   Title be independent in initial HEP   Status Achieved   PT SHORT TERM GOAL #2   Title demonstrate Lt knee AROM flexion > or = to 75 degrees           PT Long Term Goals - 04/08/15 0932    PT LONG TERM GOAL #1   Title be independent in advanced HEP   Time 8   Period Weeks   Status On-going   PT LONG TERM GOAL #2   Title improve FOTO to < or = to 48% limitaiton   Time 8   Period Weeks   Status On-going   PT LONG TERM GOAL #3   Title demonstrate Lt knee AROM flexion to > or = to 95 degrees to improve sitting   Time 8   Period Weeks   Status On-going   PT LONG TERM GOAL #4   Title demonstrate 4+/5 Lt hip strength to improve gait   Time 8   Period Weeks   Status On-going   PT LONG TERM GOAL #5   Title wean from crutch and demonstrate Lt knee flexion with swing through phase of gait on level surface   Time 8   Period Weeks   Status On-going   PT LONG TERM GOAL #6   Title --   PT LONG TERM GOAL #7   Title --               Plan - 04/19/15 0909    Clinical Impression Statement Pt progressed is limited due to edema, very sensitive and redness on med side of knee. Pt with gait abnormality due to reduced knee flexion. Pt able to tolerate exercises in PT well, and she will continue to benefit from skilled PT for Lt knee ROM, edema managment, gait and strength.  Pt will benefit from skilled therapeutic intervention in order to improve on the following deficits Abnormal  gait;Decreased activity tolerance;Difficulty walking;Increased edema;Impaired flexibility;Decreased strength;Hypomobility;Decreased range of motion;Decreased endurance;Decreased mobility;Decreased scar mobility;Increased muscle spasms;Pain;Increased fascial restricitons   Rehab Potential Excellent   Clinical Impairments Affecting Rehab Potential Lt knee manipulation TKA replacement in July 2016, Patellatendon rupture, Haematoma, open wound,    PT Frequency 3x / week   PT Duration 8 weeks   PT Treatment/Interventions ADLs/Self Care Home Management;Cryotherapy;Electrical Stimulation;Gait training;Ultrasound;Moist Heat;Stair training;Functional mobility training;Therapeutic activities;Therapeutic exercise;Neuromuscular re-education;Manual techniques;Patient/family education;Scar mobilization;Passive range of motion;Taping;Vasopneumatic Device   PT Next Visit Plan Lt knee AROM progression, edema management, gait, strength   Consulted and Agree with Plan of Care Patient        Problem List Patient Active Problem List   Diagnosis Date Noted  . Arthrofibrosis of total knee arthroplasty (HCC) 03/25/2015  . Quadriceps tendon rupture 12/09/2014  . OA (osteoarthritis) of knee 11/26/2014    NAUMANN-HOUEGNIFIO,Kenton Fortin PTA 04/19/2015, 9:41 AM  [facility] [address], [address] [place], [place], [address] Phone: [phone]   Fax:  [phone]  Name: Fonda KinderJanet G Gilcrest MRN: [id number] Date of Birth: [birth year]

## 2015-04-22 ENCOUNTER — Ambulatory Visit: Payer: Medicare Other

## 2015-04-22 DIAGNOSIS — Z96652 Presence of left artificial knee joint: Secondary | ICD-10-CM | POA: Diagnosis not present

## 2015-04-22 DIAGNOSIS — M25562 Pain in left knee: Secondary | ICD-10-CM

## 2015-04-22 DIAGNOSIS — M25662 Stiffness of left knee, not elsewhere classified: Secondary | ICD-10-CM

## 2015-04-22 DIAGNOSIS — R269 Unspecified abnormalities of gait and mobility: Secondary | ICD-10-CM

## 2015-04-22 DIAGNOSIS — Z471 Aftercare following joint replacement surgery: Secondary | ICD-10-CM | POA: Diagnosis not present

## 2015-04-22 DIAGNOSIS — M7989 Other specified soft tissue disorders: Secondary | ICD-10-CM

## 2015-04-22 DIAGNOSIS — R531 Weakness: Secondary | ICD-10-CM | POA: Diagnosis not present

## 2015-04-22 NOTE — Therapy (Signed)
[facility] [address], [address] [place], [place], [address] Phone: [phone]   Fax:  [phone]  Physical Therapy Treatment  Patient Details  Name: Lauren English MRN: [id number] Date of Birth: 12-18-1940 Referring Provider: Ollen GrossAluisio, Frank, MD  Encounter Date: 04/22/2015      PT End of Session - 04/22/15 1432    Visit Number 15   Number of Visits 20   Date for PT Re-Evaluation 05/22/15   PT Start Time 1356   PT Stop Time 1445   PT Time Calculation (min) 49 min   Activity Tolerance Patient tolerated treatment well  treatment shortened as pt has significant tenderness and edema in Lt knee today   Behavior During Therapy [facility] for tasks assessed/performed      Past Medical History  Diagnosis Date  . PONV (postoperative nausea and vomiting)   . Bladder incontinence   . OSA on CPAP   . Arthritis     "qwhere; feet, knees, neck" (04/21/2012)  . Chronic back pain   . Closed angle glaucoma     "both eyes" (04/21/2012)  . Prediabetes   . Urinary tract bacterial infections   . Overactive bladder   . Numbness     fingertips bilat and feet bilat   . Peripheral neuropathy (HCC)     feet bilat   . Difficult intubation     with intubation on November 26, 2014  . Pneumonia     hx. of as child  . Anemia     Past Surgical History  Procedure Laterality Date  . Back surgery    . Knee arthroplasty  06/2008    "slipped on ice; torn ligaments & cartilage" (04/21/2012)  . Posterior lumbar fusion  04/21/2012  . Tonsillectomy and adenoidectomy  ~ 1950  . Abdominal hysterectomy  1980    "partial" (04/21/2012)  . Bladder suspension  1990  . Anterior fusion cervical spine  12/2005  . Tubal ligation  1970's  . Dilation and curettage of uterus  1980  . Appendectomy    . Total knee arthroplasty Left 11/26/2014    Procedure: TOTAL LEFT   KNEE ARTHROPLASTY;  Surgeon: Ollen GrossFrank Aluisio, MD;  Location: [facility];  Service: Orthopedics;   Laterality: Left;  . Patellar tendon repair Left 12/10/2014    Procedure: REPAIR PARTIAL QUAD TENDON TEAR;  Surgeon: Ollen GrossFrank Aluisio, MD;  Location: [facility];  Service: Orthopedics;  Laterality: Left;  . Knee closed reduction Left 03/25/2015    Procedure: CLOSED MANIPULATION LEFT KNEE;  Surgeon: Ollen GrossFrank Aluisio, MD;  Location: [facility];  Service: Orthopedics;  Laterality: Left;    There were no vitals filed for this visit.  Visit Diagnosis:  Left knee pain  Weakness  Abnormality of gait  Swelling of limb  Stiffness of left knee      Subjective Assessment - 04/22/15 1401    Subjective lt knee started draining over the weekend.  Pt called MD and he was not concerned.  Lt knee is swollen and red/dark today.     Currently in Pain? Yes   Pain Score 3    Pain Location Leg   Pain Orientation Left   Pain Descriptors / Indicators Sore;Tightness   Pain Type Surgical pain   Pain Onset More than a month ago   Aggravating Factors  pain with bending the knee, walking   Pain Relieving Factors rest, ice, not bending             [facility] PT Assessment -  04/22/15 0001    Observation/Other Assessments-Edema    Edema Circumferential   Circumferential Edema   Circumferential - Left  52 cm                     [facility] Adult PT Treatment/Exercise - 04/22/15 0001    Knee/Hip Exercises: Stretches   Lobbyist Left;10 seconds;2 reps  dynamic for 1 min, and 3 x with 30 sec hold   Knee/Hip Exercises: Aerobic   Nustep for bending Level 1x 10  seat and arms on 10   Knee/Hip Exercises: Standing   Rebounder weightshift in 3 directions x 1 min each   Other Standing Knee Exercises squatting in available range with B UE holding on stairs, chair behind  2x10   Modalities   Modalities Vasopneumatic   Vasopneumatic   Number Minutes Vasopneumatic  15 minutes   Vasopnuematic Location  Knee   Vasopneumatic Pressure Medium   Vasopneumatic Temperature  3 snowflakes   Manual Therapy   Manual  Therapy Soft tissue mobilization;Edema management;Passive ROM   Manual therapy comments PROM into flexion to incr ROM    Edema Management retrograde massage for edema   Passive ROM into flexion as tolerated                  PT Short Term Goals - 04/15/15 1610    PT SHORT TERM GOAL #1   Title be independent in initial HEP   Status Achieved   PT SHORT TERM GOAL #2   Title demonstrate Lt knee AROM flexion > or = to 75 degrees           PT Long Term Goals - 04/22/15 1434    PT LONG TERM GOAL #1   Title be independent in advanced HEP   Time 8   Period Weeks   Status On-going   PT LONG TERM GOAL #2   Title improve FOTO to < or = to 48% limitaiton   Time 8   Period Weeks   Status On-going   PT LONG TERM GOAL #3   Title demonstrate Lt knee AROM flexion to > or = to 95 degrees to improve sitting   Time 8   Period Weeks   Status On-going   PT LONG TERM GOAL #5   Title wean from crutch and demonstrate Lt knee flexion with swing through phase of gait on level surface   Time 8   Period Weeks   Status On-going               Plan - 04/22/15 1407    Clinical Impression Statement Pt with signifcant edema and redness in the Lt knee.  Pt has drainage from Lt knee and called MD to discuss.  Pt with limited AROM due to edema and gait abnonmality due to limited AROM.  Pt advised to call MD again.  Pt will continue to benefit from skilled PT for ROM, edema mangement, gait and strength.     Pt will benefit from skilled therapeutic intervention in order to improve on the following deficits Abnormal gait;Decreased activity tolerance;Difficulty walking;Increased edema;Impaired flexibility;Decreased strength;Hypomobility;Decreased range of motion;Decreased endurance;Decreased mobility;Decreased scar mobility;Increased muscle spasms;Pain;Increased fascial restricitons   Rehab Potential Excellent   Clinical Impairments Affecting Rehab Potential Lt knee manipulation TKA replacement  in July 2016, Patellatendon rupture, Haematoma, open wound,    PT Frequency 3x / week   PT Duration 8 weeks   PT Treatment/Interventions ADLs/Self Care Home Management;Cryotherapy;Electrical Stimulation;Gait training;Ultrasound;Moist Heat;Stair training;Functional  mobility training;Therapeutic activities;Therapeutic exercise;Neuromuscular re-education;Manual techniques;Patient/family education;Scar mobilization;Passive range of motion;Taping;Vasopneumatic Device   PT Next Visit Plan Lt knee AROM progression, edema management, gait, strength   Consulted and Agree with Plan of Care Patient        Problem List Patient Active Problem List   Diagnosis Date Noted  . Arthrofibrosis of total knee arthroplasty (HCC) 03/25/2015  . Quadriceps tendon rupture 12/09/2014  . OA (osteoarthritis) of knee 11/26/2014    TAKACS,KELLY, PT 04/22/2015, 2:35 PM  [facility] [address], [address] [place], [place], [address] Phone: [phone]   Fax:  [phone]  Name: Lauren English MRN: [id number] Date of Birth: 1941-02-14

## 2015-04-24 ENCOUNTER — Ambulatory Visit: Payer: Medicare Other

## 2015-04-24 DIAGNOSIS — M25562 Pain in left knee: Secondary | ICD-10-CM

## 2015-04-24 DIAGNOSIS — R531 Weakness: Secondary | ICD-10-CM

## 2015-04-24 DIAGNOSIS — R269 Unspecified abnormalities of gait and mobility: Secondary | ICD-10-CM | POA: Diagnosis not present

## 2015-04-24 DIAGNOSIS — M25662 Stiffness of left knee, not elsewhere classified: Secondary | ICD-10-CM | POA: Diagnosis not present

## 2015-04-24 DIAGNOSIS — M7989 Other specified soft tissue disorders: Secondary | ICD-10-CM | POA: Diagnosis not present

## 2015-04-24 NOTE — Therapy (Signed)
[facility] [address], [address] [place], [place], [address] Phone: [phone]   Fax:  [phone]  Physical Therapy Treatment  Patient Details  Name: Lauren English MRN: [id number] Date of Birth: 11/17/1940 Referring Provider: Ollen Gross, MD  Encounter Date: 04/24/2015      PT End of Session - 04/24/15 0928    Visit Number 16   Number of Visits 20   Date for PT Re-Evaluation 05/22/15   PT Start Time 0844   PT Stop Time 0942   PT Time Calculation (min) 58 min   Activity Tolerance Patient tolerated treatment well   Behavior During Therapy [facility] for tasks assessed/performed      Past Medical History  Diagnosis Date  . PONV (postoperative nausea and vomiting)   . Bladder incontinence   . OSA on CPAP   . Arthritis     "qwhere; feet, knees, neck" (04/21/2012)  . Chronic back pain   . Closed angle glaucoma     "both eyes" (04/21/2012)  . Prediabetes   . Urinary tract bacterial infections   . Overactive bladder   . Numbness     fingertips bilat and feet bilat   . Peripheral neuropathy (HCC)     feet bilat   . Difficult intubation     with intubation on November 26, 2014  . Pneumonia     hx. of as child  . Anemia     Past Surgical History  Procedure Laterality Date  . Back surgery    . Knee arthroplasty  06/2008    "slipped on ice; torn ligaments & cartilage" (04/21/2012)  . Posterior lumbar fusion  04/21/2012  . Tonsillectomy and adenoidectomy  ~ 1950  . Abdominal hysterectomy  1980    "partial" (04/21/2012)  . Bladder suspension  1990  . Anterior fusion cervical spine  12/2005  . Tubal ligation  1970's  . Dilation and curettage of uterus  1980  . Appendectomy    . 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 Patellar tendon repair Left 12/10/2014    Procedure: REPAIR  PARTIAL QUAD TENDON TEAR;  Surgeon: Ollen Gross, MD;  Location: [facility];  Service: Orthopedics;  Laterality: Left;  . Knee closed reduction Left 03/25/2015    Procedure: CLOSED MANIPULATION LEFT KNEE;  Surgeon: Ollen Gross, MD;  Location: [facility];  Service: Orthopedics;  Laterality: Left;    There were no vitals filed for this visit.  Visit Diagnosis:  Left knee pain  Weakness  Abnormality of gait  Swelling of limb  Stiffness of left knee      Subjective Assessment - 04/24/15 0856    Subjective Pt saw PA on Monday.  Pt is now taking antibiotic.  Knee feels and looks much better.     Currently in Pain? No/denies                         [facility] Adult PT Treatment/Exercise - 04/24/15 0001    Knee/Hip Exercises: Aerobic   Nustep for bending Level 1x 10  seat and arms on 10   Knee/Hip Exercises: Machines for Strengthening   Total Gym Leg Press --   Knee/Hip Exercises: Standing   Rebounder weightshift in 3 directions x 1 min each   Other Standing Knee Exercises squatting in available range with B  UE holding on stairs, chair behind  2x10   Vasopneumatic   Number Minutes Vasopneumatic  15 minutes   Vasopnuematic Location  Knee   Vasopneumatic Pressure Medium   Vasopneumatic Temperature  3 snowflakes   Manual Therapy   Manual Therapy Soft tissue mobilization;Edema management;Passive ROM   Manual therapy comments PROM into flexion to incr ROM    Edema Management retrograde massage for edema   Passive ROM into flexion as tolerated                  PT Short Term Goals - 04/15/15 0806    PT SHORT TERM GOAL #1   Title be independent in initial HEP   Status Achieved   PT SHORT TERM GOAL #2   Title demonstrate Lt knee AROM flexion > or = to 75 degrees           PT Long Term Goals - 04/22/15 1434    PT LONG TERM GOAL #1   Title be independent in advanced HEP   Time 8   Period Weeks   Status On-going   PT LONG TERM GOAL #2   Title improve  FOTO to < or = to 48% limitaiton   Time 8   Period Weeks   Status On-going   PT LONG TERM GOAL #3   Title demonstrate Lt knee AROM flexion to > or = to 95 degrees to improve sitting   Time 8   Period Weeks   Status On-going   PT LONG TERM GOAL #5   Title wean from crutch and demonstrate Lt knee flexion with swing through phase of gait on level surface   Time 8   Period Weeks   Status On-going               Plan - 04/24/15 0859    Clinical Impression Statement Lt knee with improved appearance today after pt starting antibiotic 2 days ago.  Pt with limited AROM due to significant edema and gait abnormality due to limited AROM.  Pt with gait abnormality due to limited AROM.  Pt will continue to beneift from skilled PT for ROM, edema management and gait and strength.    Pt will benefit from skilled therapeutic intervention in order to improve on the following deficits Abnormal gait;Decreased activity tolerance;Difficulty walking;Increased edema;Impaired flexibility;Decreased strength;Hypomobility;Decreased range of motion;Decreased endurance;Decreased mobility;Decreased scar mobility;Increased muscle spasms;Pain;Increased fascial restricitons   Rehab Potential Excellent   Clinical Impairments Affecting Rehab Potential Lt knee manipulation TKA replacement in July 2016, Patellatendon rupture, Haematoma, open wound,    PT Frequency 3x / week   PT Duration 8 weeks   PT Treatment/Interventions ADLs/Self Care Home Management;Cryotherapy;Electrical Stimulation;Gait training;Ultrasound;Moist Heat;Stair training;Functional mobility training;Therapeutic activities;Therapeutic exercise;Neuromuscular re-education;Manual techniques;Patient/family education;Scar mobilization;Passive range of motion;Taping;Vasopneumatic Device   PT Next Visit Plan Lt knee AROM progression, edema management, gait, strength   Consulted and Agree with Plan of Care Patient        Problem List Patient Active Problem  List   Diagnosis Date Noted  . Arthrofibrosis of total knee arthroplasty (HCC) 03/25/2015  . Quadriceps tendon rupture 12/09/2014  . OA (osteoarthritis) of knee 11/26/2014    Tyjai Charbonnet, PT 04/24/2015, 9:30 AM  [facility] [address], [address] [place], [place], [address] Phone: [phone]   Fax:  [phone]  Name: Lauren English MRN: [id number] Date of Birth: 17-May-1940

## 2015-04-25 DIAGNOSIS — Z96652 Presence of left artificial knee joint: Secondary | ICD-10-CM | POA: Diagnosis not present

## 2015-04-25 DIAGNOSIS — Z471 Aftercare following joint replacement surgery: Secondary | ICD-10-CM | POA: Diagnosis not present

## 2015-04-26 ENCOUNTER — Encounter: Payer: Self-pay | Admitting: Physical Therapy

## 2015-04-26 ENCOUNTER — Ambulatory Visit: Payer: Medicare Other | Admitting: Physical Therapy

## 2015-04-26 DIAGNOSIS — D500 Iron deficiency anemia secondary to blood loss (chronic): Secondary | ICD-10-CM | POA: Diagnosis not present

## 2015-04-26 DIAGNOSIS — M25662 Stiffness of left knee, not elsewhere classified: Secondary | ICD-10-CM | POA: Diagnosis not present

## 2015-04-26 DIAGNOSIS — R269 Unspecified abnormalities of gait and mobility: Secondary | ICD-10-CM | POA: Diagnosis not present

## 2015-04-26 DIAGNOSIS — M7989 Other specified soft tissue disorders: Secondary | ICD-10-CM

## 2015-04-26 DIAGNOSIS — M25562 Pain in left knee: Secondary | ICD-10-CM

## 2015-04-26 DIAGNOSIS — R531 Weakness: Secondary | ICD-10-CM | POA: Diagnosis not present

## 2015-04-26 NOTE — Therapy (Signed)
[facility] [address], [address] [place], [place], [address] Phone: [phone]   Fax:  [phone]  Physical Therapy Treatment  Patient Details  Name: Lauren English MRN: [id number] Date of Birth: 1941/04/17 Referring Provider: Ollen GrossAluisio, Frank, MD  Encounter Date: 04/26/2015      PT End of Session - 04/26/15 0929    Visit Number 17   Number of Visits 20   Date for PT Re-Evaluation 05/22/15   PT Start Time 0833   PT Stop Time 0943   PT Time Calculation (min) 70 min   Activity Tolerance Patient tolerated treatment well   Behavior During Therapy [facility] for tasks assessed/performed      Past Medical History  Diagnosis Date  . PONV (postoperative nausea and vomiting)   . Bladder incontinence   . OSA on CPAP   . Arthritis     "qwhere; feet, knees, neck" (04/21/2012)  . Chronic back pain   . Closed angle glaucoma     "both eyes" (04/21/2012)  . Prediabetes   . Urinary tract bacterial infections   . Overactive bladder   . Numbness     fingertips bilat and feet bilat   . Peripheral neuropathy (HCC)     feet bilat   . Difficult intubation     with intubation on November 26, 2014  . Pneumonia     hx. of as child  . Anemia     Past Surgical History  Procedure Laterality Date  . Back surgery    . Knee arthroplasty  06/2008    "slipped on ice; torn ligaments & cartilage" (04/21/2012)  . Posterior lumbar fusion  04/21/2012  . Tonsillectomy and adenoidectomy  ~ 1950  . Abdominal hysterectomy  1980    "partial" (04/21/2012)  . Bladder suspension  1990  . Anterior fusion cervical spine  12/2005  . Tubal ligation  1970's  . Dilation and curettage of uterus  1980  . Appendectomy    . 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 Patellar tendon repair Left 12/10/2014    Procedure: REPAIR  PARTIAL QUAD TENDON TEAR;  Surgeon: Ollen GrossFrank Aluisio, MD;  Location: [facility];  Service: Orthopedics;  Laterality: Left;  . Knee closed reduction Left 03/25/2015    Procedure: CLOSED MANIPULATION LEFT KNEE;  Surgeon: Ollen GrossFrank Aluisio, MD;  Location: [facility];  Service: Orthopedics;  Laterality: Left;    There were no vitals filed for this visit.  Visit Diagnosis:  Left knee pain  Weakness  Abnormality of gait  Swelling of limb  Stiffness of left knee      Subjective Assessment - 04/26/15 0858    Subjective Pt saw PA and Dr Despina HickAlusio yesterday, they drained her left knee. Pt has next MD appointment on Tuesday December 20th. Pt reports tightness and redness in left leg improved slightly since on antibiotica meds   Patient is accompained by: Family member   Limitations Sitting   How long can you sit comfortably? not able to bend knee with sitting   How long can you stand comfortably? 30 minutes   How long can you walk comfortably? pt is using a crutch   Patient Stated Goals bend the knee, walk without crutch   Currently in Pain? No/denies  [facility] Adult PT Treatment/Exercise - 04/26/15 0001    Exercises   Exercises Knee/Hip   Knee/Hip Exercises: Stretches   Quad Stretch Left;10 seconds;2 reps  in supine   Knee/Hip Exercises: Aerobic   Nustep for bending Level 1x 11  seat and arms on 10   Knee/Hip Exercises: Supine   Quad Sets Strengthening;Left;20 reps;10 reps  with 5 sec hold   Heel Slides AROM;Left  2x 1 min   Straight Leg Raises Strengthening;3 sets;10 reps   Knee/Hip Exercises: Sidelying   Hip ABduction Strengthening;Left;3 sets;20 reps;10 reps   Modalities   Modalities Moist Heat   Moist Heat Therapy   Number Minutes Moist Heat 15 Minutes   Moist Heat Location Knee  and thight   Vasopneumatic   Number Minutes Vasopneumatic  --  Per MD pt needs to use heat   Manual Therapy   Passive ROM into flexion as tolerated                   PT Short Term Goals - 04/15/15 0806    PT SHORT TERM GOAL #1   Title be independent in initial HEP   Status Achieved   PT SHORT TERM GOAL #2   Title demonstrate Lt knee AROM flexion > or = to 75 degrees           PT Long Term Goals - 04/22/15 1434    PT LONG TERM GOAL #1   Title be independent in advanced HEP   Time 8   Period Weeks   Status On-going   PT LONG TERM GOAL #2   Title improve FOTO to < or = to 48% limitaiton   Time 8   Period Weeks   Status On-going   PT LONG TERM GOAL #3   Title demonstrate Lt knee AROM flexion to > or = to 95 degrees to improve sitting   Time 8   Period Weeks   Status On-going   PT LONG TERM GOAL #5   Title wean from crutch and demonstrate Lt knee flexion with swing through phase of gait on level surface   Time 8   Period Weeks   Status On-going               Plan - 04/26/15 0929    Clinical Impression Statement Lt knee continues to slighly improve since starting antibiotica 4 days ago. Pt has bandage due to drainage by MD yesterday. Today, performed gentle stretching and strengthening in PT session. Pt will continue to benefit from skilled PT to continfue to improve to patients tolerance with ROM, strength and endurance.   Rehab Potential Excellent   Clinical Impairments Affecting Rehab Potential Lt knee manipulation TKA replacement in July 2016, Patellatendon rupture, Haematoma, open wound,    PT Frequency 3x / week   PT Duration 8 weeks   PT Treatment/Interventions ADLs/Self Care Home Management;Cryotherapy;Electrical Stimulation;Gait training;Ultrasound;Moist Heat;Stair training;Functional mobility training;Therapeutic activities;Therapeutic exercise;Neuromuscular re-education;Manual techniques;Patient/family education;Scar mobilization;Passive range of motion;Taping;Vasopneumatic Device   PT Next Visit Plan Forward PT note to MD, continue to progress AROM, edema control, gait and strength to patients  tolerance    Consulted and Agree with Plan of Care Patient        Problem List Patient Active Problem List   Diagnosis Date Noted  . Arthrofibrosis of total knee arthroplasty (HCC) 03/25/2015  . Quadriceps tendon rupture 12/09/2014  . OA (osteoarthritis) of knee 11/26/2014    NAUMANN-HOUEGNIFIO,Bijal Siglin PTA 04/26/2015, 9:38 AM  [facility]  [address], [address] [place], [place], [address] Phone: [phone]   Fax:  [phone]  Name: Lauren English MRN: [id number] Date of Birth: 1940/12/11

## 2015-04-29 ENCOUNTER — Ambulatory Visit: Payer: Medicare Other | Admitting: Physical Therapy

## 2015-04-29 ENCOUNTER — Encounter: Payer: Self-pay | Admitting: Physical Therapy

## 2015-04-29 DIAGNOSIS — M25562 Pain in left knee: Secondary | ICD-10-CM | POA: Diagnosis not present

## 2015-04-29 DIAGNOSIS — M25662 Stiffness of left knee, not elsewhere classified: Secondary | ICD-10-CM

## 2015-04-29 DIAGNOSIS — M7989 Other specified soft tissue disorders: Secondary | ICD-10-CM

## 2015-04-29 DIAGNOSIS — R269 Unspecified abnormalities of gait and mobility: Secondary | ICD-10-CM

## 2015-04-29 DIAGNOSIS — R531 Weakness: Secondary | ICD-10-CM | POA: Diagnosis not present

## 2015-04-29 NOTE — Therapy (Signed)
[facility] [address], [address] [place], [place], [address] Phone: [phone]   Fax:  [phone]  Physical Therapy Treatment  Patient Details  Name: MILIANI DEIKE MRN: [id number] Date of Birth: [birth year] Referring Provider: Ollen Gross, MD  Encounter Date: 04/29/2015      PT End of Session - 04/29/15 0855    Visit Number 18   Number of Visits 20   Date for PT Re-Evaluation 05/22/15   PT Start Time 0846   PT Stop Time 0947   PT Time Calculation (min) 61 min   Activity Tolerance Patient tolerated treatment well   Behavior During Therapy [facility] for tasks assessed/performed      Past Medical History  Diagnosis Date  . PONV (postoperative nausea and vomiting)   . Bladder incontinence   . OSA on CPAP   . Arthritis     "qwhere; feet, knees, neck" (04/21/2012)  . Chronic back pain   . Closed angle glaucoma     "both eyes" (04/21/2012)  . Prediabetes   . Urinary tract bacterial infections   . Overactive bladder   . Numbness     fingertips bilat and feet bilat   . Peripheral neuropathy (HCC)     feet bilat   . Difficult intubation     with intubation on November 26, 2014  . Pneumonia     hx. of as child  . Anemia     Past Surgical History  Procedure Laterality Date  . Back surgery    . Knee arthroplasty  06/2008    "slipped on ice; torn ligaments & cartilage" (04/21/2012)  . Posterior lumbar fusion  04/21/2012  . Tonsillectomy and adenoidectomy  ~ 1950  . Abdominal hysterectomy  1980    "partial" (04/21/2012)  . Bladder suspension  1990  . Anterior fusion cervical spine  12/2005  . Tubal ligation  1970's  . Dilation and curettage of uterus  1980  . Appendectomy    . 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 Patellar tendon repair Left 12/10/2014    Procedure: REPAIR  PARTIAL QUAD TENDON TEAR;  Surgeon: Ollen Gross, MD;  Location: [facility];  Service: Orthopedics;  Laterality: Left;  . Knee closed reduction Left 03/25/2015    Procedure: CLOSED MANIPULATION LEFT KNEE;  Surgeon: Ollen Gross, MD;  Location: [facility];  Service: Orthopedics;  Laterality: Left;    There were no vitals filed for this visit.  Visit Diagnosis:  Left knee pain  Weakness  Abnormality of gait  Swelling of limb  Stiffness of left knee      Subjective Assessment - 04/29/15 0924    Subjective Pt reports feels 30% better since last week. She reports her knee is draining and notices less swelling. Ambulation is improving. Pt reports no complain of pain    Patient is accompained by: Family member   Limitations Sitting   How long can you sit comfortably? not able to bend knee with sitting   How long can you stand comfortably? 30 minutes   How long can you walk comfortably? pt is using a crutch   Patient Stated Goals bend the knee, walk without crutch   Currently in Pain? No/denies   Multiple Pain Sites No            [facility] PT Assessment - 04/29/15 0001  Assessment   Medical Diagnosis S/P left total knee arthroplasty, wound debridement, Lt knee manipulation    Onset Date/Surgical Date 03/25/15   Next MD Visit 04/30/15   Precautions   Precautions Fall   Balance Screen   Has the patient fallen in the past 6 months No   Has the patient had a decrease in activity level because of a fear of falling?  No   Is the patient reluctant to leave their home because of a fear of falling?  No   Home Environment   Living Environment Private residence   Living Arrangements Spouse/significant other   Type of Home House   Home Access Stairs to enter   Entrance Stairs-Number of Steps 2   Home Layout Multi-level   Alternate Level Stairs-Number of Steps 15   Alternate Level Stairs-Rails Can reach both   Prior Function   Level of Independence Independent with household mobility with  device;Independent with basic ADLs   Vocation Retired   Insurance claims handlerLeisure sew   Cognition   Overall Cognitive Status Within Functional Limits for tasks assessed   Observation/Other Assessments   Skin Integrity Pt knee and left leg appears with slighlless swelling and shinning   Observation/Other Assessments-Edema    Edema Circumferential   Circumferential Edema   Circumferential - Left  51  with wrap around knee   ROM / Strength   AROM / PROM / Strength AROM   AROM   AROM Assessment Site Knee   Right/Left Knee Left   Left Knee Extension 0   Left Knee Flexion 54   PROM   PROM Assessment Site Knee   Right/Left Knee Left   Left Knee Flexion 57   Palpation   Palpation comment pitting edema in the Lt LE about the knee and ankle, warmth about the Lt knee joint   Ambulation/Gait   Ambulation/Gait Yes   Ambulation/Gait Assistance 6: Modified independent (Device/Increase time)   Assistive device Straight cane   Gait Pattern Step-to pattern;Decreased hip/knee flexion - left                     [facility] Adult PT Treatment/Exercise - 04/29/15 0001    Exercises   Exercises Knee/Hip   Knee/Hip Exercises: Stretches   Quad Stretch Left;10 seconds;2 reps  in supine   Knee/Hip Exercises: Aerobic   Nustep for bending Level 1x 11  seat 7 arms on 10   Knee/Hip Exercises: Standing   Stairs Dynamic flexion 2 x 1 min  pn 1st and 2nd step, quadriceps stretching x 3 with 20 sec h   Other Standing Knee Exercises squatting in available range with B UE holding on stairs, chair behind  3 x 10   Knee/Hip Exercises: Supine   Quad Sets Strengthening;Left;20 reps;10 reps  with 5 sec hold   Heel Slides AROM;Left  2 x 1min   Modalities   Modalities Moist Heat   Moist Heat Therapy   Number Minutes Moist Heat 10 Minutes   Moist Heat Location Knee  and thight   Manual Therapy   Manual therapy comments PROM into flexion to incr ROM    Passive ROM into flexion as tolerated                   PT Short Term Goals - 04/29/15 [id number]    PT SHORT TERM GOAL #1   Title be independent in initial HEP   Time 3   Period Weeks   Status Achieved   PT  SHORT TERM GOAL #2   Title demonstrate Lt knee AROM flexion > or = to 75 degrees   Time 3   Period Weeks   Status On-going           PT Long Term Goals - 04/29/15 1055    PT LONG TERM GOAL #1   Title be independent in advanced HEP   Time 8   Period Weeks   Status On-going   PT LONG TERM GOAL #2   Title improve FOTO to < or = to 48% limitaiton   Time 8   Period Weeks   Status On-going   PT LONG TERM GOAL #3   Title demonstrate Lt knee AROM flexion to > or = to 95 degrees to improve sitting   Time 8   Period Weeks   Status On-going   PT LONG TERM GOAL #4   Title demonstrate 4+/5 Lt hip strength to improve gait   Time 8   Period Weeks   Status On-going   PT LONG TERM GOAL #5   Title wean from crutch and demonstrate Lt knee flexion with swing through phase of gait on level surface   Period Weeks   Status On-going               Plan - 04/29/15 0855    Clinical Impression Statement Lt knee edema is improving since starting taking antibiotica last week. Pt reports she lost 5# due to elimination of fluid. Pt appears with improved energie and activity tolerance with activities in PT today. Pt will continue to benefit from skilled PT to improve Lt knee ROM, strength and endurance.    Pt will benefit from skilled therapeutic intervention in order to improve on the following deficits Abnormal gait;Decreased activity tolerance;Difficulty walking;Increased edema;Impaired flexibility;Decreased strength;Hypomobility;Decreased range of motion;Decreased endurance;Decreased mobility;Decreased scar mobility;Increased muscle spasms;Pain;Increased fascial restricitons   Rehab Potential Excellent   Clinical Impairments Affecting Rehab Potential Lt knee manipulation TKA replacement in July 2016, Patellatendon rupture, Haematoma, open  wound,    PT Frequency 3x / week   PT Duration 8 weeks   PT Treatment/Interventions ADLs/Self Care Home Management;Cryotherapy;Electrical Stimulation;Gait training;Ultrasound;Moist Heat;Stair training;Functional mobility training;Therapeutic activities;Therapeutic exercise;Neuromuscular re-education;Manual techniques;Patient/family education;Scar mobilization;Passive range of motion;Taping;Vasopneumatic Device   PT Next Visit Plan Forward PT note to MD, continue to progress AROM, edema control, gait and strength to patients tolerance    Consulted and Agree with Plan of Care Patient        Problem List Patient Active Problem List   Diagnosis Date Noted  . Arthrofibrosis of total knee arthroplasty (HCC) 03/25/2015  . Quadriceps tendon rupture 12/09/2014  . OA (osteoarthritis) of knee 11/26/2014    NAUMANN-HOUEGNIFIO,Leiyah Maultsby PTA 04/29/2015, 11:02 AM  [facility] [address], [address] [place], [place], [address] Phone: [phone]   Fax:  [phone]  Name: JOEE IOVINE MRN: [id number] Date of Birth: [birth year]

## 2015-04-30 DIAGNOSIS — Z471 Aftercare following joint replacement surgery: Secondary | ICD-10-CM | POA: Diagnosis not present

## 2015-04-30 DIAGNOSIS — Z96652 Presence of left artificial knee joint: Secondary | ICD-10-CM | POA: Diagnosis not present

## 2015-05-01 ENCOUNTER — Ambulatory Visit: Payer: Medicare Other

## 2015-05-01 DIAGNOSIS — M25662 Stiffness of left knee, not elsewhere classified: Secondary | ICD-10-CM | POA: Diagnosis not present

## 2015-05-01 DIAGNOSIS — M7989 Other specified soft tissue disorders: Secondary | ICD-10-CM

## 2015-05-01 DIAGNOSIS — R269 Unspecified abnormalities of gait and mobility: Secondary | ICD-10-CM | POA: Diagnosis not present

## 2015-05-01 DIAGNOSIS — R531 Weakness: Secondary | ICD-10-CM

## 2015-05-01 DIAGNOSIS — M25562 Pain in left knee: Secondary | ICD-10-CM | POA: Diagnosis not present

## 2015-05-01 NOTE — Therapy (Addendum)
[facility] [address], [address] [place], [place], [address] Phone: [phone]   Fax:  [phone]  Physical Therapy Treatment  Patient Details  Name: Lauren English MRN: [id number] Date of Birth: Jun 29, 1940 Referring Provider: Ollen Gross, MD  Encounter Date: 05/01/2015      PT End of Session - 05/01/15 0931    Visit Number 19   Number of Visits 29  add KX to charges   Date for PT Re-Evaluation 05/22/15   PT Start Time 0849   PT Stop Time 0942   PT Time Calculation (min) 53 min   Activity Tolerance Patient tolerated treatment well   Behavior During Therapy [facility] for tasks assessed/performed      Past Medical History  Diagnosis Date  . PONV (postoperative nausea and vomiting)   . Bladder incontinence   . OSA on CPAP   . Arthritis     "qwhere; feet, knees, neck" (04/21/2012)  . Chronic back pain   . Closed angle glaucoma     "both eyes" (04/21/2012)  . Prediabetes   . Urinary tract bacterial infections   . Overactive bladder   . Numbness     fingertips bilat and feet bilat   . Peripheral neuropathy (HCC)     feet bilat   . Difficult intubation     with intubation on November 26, 2014  . Pneumonia     hx. of as child  . Anemia     Past Surgical History  Procedure Laterality Date  . Back surgery    . Knee arthroplasty  06/2008    "slipped on ice; torn ligaments & cartilage" (04/21/2012)  . Posterior lumbar fusion  04/21/2012  . Tonsillectomy and adenoidectomy  ~ 1950  . Abdominal hysterectomy  1980    "partial" (04/21/2012)  . Bladder suspension  1990  . Anterior fusion cervical spine  12/2005  . Tubal ligation  1970's  . Dilation and curettage of uterus  1980  . Appendectomy    . 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 Patellar tendon repair Left 12/10/2014     Procedure: REPAIR PARTIAL QUAD TENDON TEAR;  Surgeon: Ollen Gross, MD;  Location: [facility];  Service: Orthopedics;  Laterality: Left;  . Knee closed reduction Left 03/25/2015    Procedure: CLOSED MANIPULATION LEFT KNEE;  Surgeon: Ollen Gross, MD;  Location: [facility];  Service: Orthopedics;  Laterality: Left;    There were no vitals filed for this visit.  Visit Diagnosis:  Left knee pain  Weakness  Abnormality of gait  Swelling of limb  Stiffness of left knee      Subjective Assessment - 05/01/15 0857    Subjective Saw MD yesterday.  He was pleased with status after draining knee.  Knee continues to drain and pt is wearing bandage.     Currently in Pain? No/denies            [facility] PT Assessment - 05/01/15 0001    Assessment   Medical Diagnosis S/P left total knee arthroplasty, wound debridement, Lt knee manipulation    Onset Date/Surgical Date 03/25/15   Next MD Visit 04/30/15   Precautions   Precautions Fall   Prior Function   Level of Independence Independent with household mobility with device;Independent with basic ADLs   Vocation Retired   Copy  Status Within Functional Limits for tasks assessed   Observation/Other Assessments-Edema    Edema Circumferential   Circumferential Edema   Circumferential - Left  51  with wrap around knee   ROM / Strength   AROM / PROM / Strength AROM   AROM   AROM Assessment Site Knee   Right/Left Knee Left   Left Knee Extension 0   Left Knee Flexion 54   PROM   PROM Assessment Site Knee   Right/Left Knee Left   Left Knee Extension 0   Left Knee Flexion 64  tested in sitting   Palpation   Palpation comment pitting edema in the Lt LE about the knee and ankle, warmth about the Lt knee joint   Ambulation/Gait   Ambulation/Gait Yes   Ambulation/Gait Assistance 6: Modified independent (Device/Increase time)   Assistive device Straight cane   Gait Pattern Step-to pattern;Decreased hip/knee flexion - left                      [facility] Adult PT Treatment/Exercise - 05/01/15 0001    Knee/Hip Exercises: Stretches   Lobbyist Left;10 seconds;2 reps  on step   Knee/Hip Exercises: Aerobic   Nustep for bending Level 1x 11  seat 7 arms on 10   Knee/Hip Exercises: Standing   Forward Step Up Left  2 reps, max UE support required-too difficulty   Rebounder weightshift in 3 directions x 1 min each   Other Standing Knee Exercises squatting in available range with B UE holding on stairs, chair behind  3 x 10   Knee/Hip Exercises: Seated   Long Arc Quad Strengthening;Left;2 sets;10 reps   Moist Heat Therapy   Number Minutes Moist Heat 10 Minutes   Moist Heat Location Knee  and thigh                  PT Short Term Goals - 04/29/15 4098    PT SHORT TERM GOAL #1   Title be independent in initial HEP   Time 3   Period Weeks   Status Achieved   PT SHORT TERM GOAL #2   Title demonstrate Lt knee AROM flexion > or = to 75 degrees   Time 3   Period Weeks   Status On-going           PT Long Term Goals - 04/29/15 1055    PT LONG TERM GOAL #1   Title be independent in advanced HEP   Time 8   Period Weeks   Status On-going   PT LONG TERM GOAL #2   Title improve FOTO to < or = to 48% limitaiton   Time 8   Period Weeks   Status On-going   PT LONG TERM GOAL #3   Title demonstrate Lt knee AROM flexion to > or = to 95 degrees to improve sitting   Time 8   Period Weeks   Status On-going   PT LONG TERM GOAL #4   Title demonstrate 4+/5 Lt hip strength to improve gait   Time 8   Period Weeks   Status On-going   PT LONG TERM GOAL #5   Title wean from crutch and demonstrate Lt knee flexion with swing through phase of gait on level surface   Period Weeks   Status On-going               Plan - 05/01/15 1191    Clinical Impression Statement Pt with Lt knee edema that  continues s/p knee being drained last week.  Pt is taking antibiotic for infection.  Pt with  continued limited Lt knee AROM/flexibility.  PT is focusing on AROM gains and normzalizing gait pattern.  Pt not able to perform step-up on the Lt due to weakness.Pt will benefit from continued skilled PT for Lt knee AROM progression, edema management and gait training.     Pt will benefit from skilled therapeutic intervention in order to improve on the following deficits Abnormal gait;Decreased activity tolerance;Difficulty walking;Increased edema;Impaired flexibility;Decreased strength;Hypomobility;Decreased range of motion;Decreased endurance;Decreased mobility;Decreased scar mobility;Increased muscle spasms;Pain;Increased fascial restricitons   Rehab Potential Excellent   Clinical Impairments Affecting Rehab Potential Lt knee manipulation TKA replacement in July 2016, Patellatendon rupture, Haematoma, open wound,    PT Frequency 3x / week   PT Duration 8 weeks   PT Treatment/Interventions ADLs/Self Care Home Management;Cryotherapy;Electrical Stimulation;Gait training;Ultrasound;Moist Heat;Stair training;Functional mobility training;Therapeutic activities;Therapeutic exercise;Neuromuscular re-education;Manual techniques;Patient/family education;Scar mobilization;Passive range of motion;Taping;Vasopneumatic Device   PT Next Visit Plan  AROM, edema management, Lt knee strength, gait training   Consulted and Agree with Plan of Care Patient          G-Codes - 05/01/15 1022    Functional Assessment Tool Used Clinical judgement   Functional Limitation Mobility: Walking and moving around   Mobility: Walking and Moving Around Current Status [phone]([id number]) At least 40 percent but less than 60 percent impaired, limited or restricted   Mobility: Walking and Moving Around Goal Status [phone]([id number]) At least 40 percent but less than 60 percent impaired, limited or restricted      Problem List Patient Active Problem List   Diagnosis Date Noted  . Arthrofibrosis of total knee arthroplasty (HCC) 03/25/2015  .  Quadriceps tendon rupture 12/09/2014  . OA (osteoarthritis) of knee 11/26/2014  Physical Therapy Progress Note  Dates of Reporting Period: 04/10/15 to 05/01/15  Objective Reports of Subjective Statement: Pt is frustrated by all of the complicating factors after surgery and quad rupture.  Pt with recent infection and Lt knee has been drained.  Doing much better after draining last week.    Objective Measurements: See above.  Goal Update: See above for goal status.  Pt not able to perform step-up on the Lt due to weakness.    Plan: Advance Lt knee AROM as able, manage edema, Lt hip and knee strength and normalize gait.  Reason Skilled Services are Required: Pt with complicating factors s/p Lt knee surgery including quad tendon rupture and repair, excessive fluid/edema and infection.  AROM has recently improved after fluid has been drained.  Pt with limited Lt knee AROM and strength deficits. Gait remains abnormal due to strength and AROM deficits.  Pt will benefit from skilled PT to improve all of the above for improve independence with home and community function.     Abbygayle Helfand, PT 05/01/2015, 10:55 AM  [facility] [address], [address] [place], [place], [address] Phone: [phone]   Fax:  [phone]  Name: Lauren English MRN: [id number] Date of Birth: 12/07/1940

## 2015-05-02 DIAGNOSIS — H04123 Dry eye syndrome of bilateral lacrimal glands: Secondary | ICD-10-CM | POA: Diagnosis not present

## 2015-05-02 DIAGNOSIS — H25013 Cortical age-related cataract, bilateral: Secondary | ICD-10-CM | POA: Diagnosis not present

## 2015-05-02 DIAGNOSIS — H2513 Age-related nuclear cataract, bilateral: Secondary | ICD-10-CM | POA: Diagnosis not present

## 2015-05-02 DIAGNOSIS — H40013 Open angle with borderline findings, low risk, bilateral: Secondary | ICD-10-CM | POA: Diagnosis not present

## 2015-05-03 ENCOUNTER — Ambulatory Visit: Payer: Medicare Other | Admitting: Physical Therapy

## 2015-05-03 DIAGNOSIS — M7989 Other specified soft tissue disorders: Secondary | ICD-10-CM

## 2015-05-03 DIAGNOSIS — M25562 Pain in left knee: Secondary | ICD-10-CM

## 2015-05-03 DIAGNOSIS — R531 Weakness: Secondary | ICD-10-CM

## 2015-05-03 DIAGNOSIS — R269 Unspecified abnormalities of gait and mobility: Secondary | ICD-10-CM

## 2015-05-03 DIAGNOSIS — M25662 Stiffness of left knee, not elsewhere classified: Secondary | ICD-10-CM

## 2015-05-03 NOTE — Therapy (Signed)
[facility] [address], [address] [place], [place], [address] Phone: [phone]   Fax:  [phone]  Physical Therapy Treatment  Patient Details  Name: Lauren English MRN: [id number] Date of Birth: [birth year] Referring Provider: Ollen Gross, MD  Encounter Date: 05/03/2015      PT End of Session - 05/03/15 0936    Visit Number 20   Number of Visits 29   Date for PT Re-Evaluation 05/22/15   PT Start Time 0845   PT Stop Time 0947   PT Time Calculation (min) 62 min   Activity Tolerance Patient tolerated treatment well   Behavior During Therapy [facility] for tasks assessed/performed      Past Medical History  Diagnosis Date  . PONV (postoperative nausea and vomiting)   . Bladder incontinence   . OSA on CPAP   . Arthritis     "qwhere; feet, knees, neck" (04/21/2012)  . Chronic back pain   . Closed angle glaucoma     "both eyes" (04/21/2012)  . Prediabetes   . Urinary tract bacterial infections   . Overactive bladder   . Numbness     fingertips bilat and feet bilat   . Peripheral neuropathy (HCC)     feet bilat   . Difficult intubation     with intubation on November 26, 2014  . Pneumonia     hx. of as child  . Anemia     Past Surgical History  Procedure Laterality Date  . Back surgery    . Knee arthroplasty  06/2008    "slipped on ice; torn ligaments & cartilage" (04/21/2012)  . Posterior lumbar fusion  04/21/2012  . Tonsillectomy and adenoidectomy  ~ 1950  . Abdominal hysterectomy  1980    "partial" (04/21/2012)  . Bladder suspension  1990  . Anterior fusion cervical spine  12/2005  . Tubal ligation  1970's  . Dilation and curettage of uterus  1980  . Appendectomy    . 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 Patellar tendon repair Left 12/10/2014    Procedure: REPAIR  PARTIAL QUAD TENDON TEAR;  Surgeon: Ollen Gross, MD;  [facility]: [facility];  Service: Orthopedics;  Laterality: Left;  . Knee closed reduction Left 03/25/2015    Procedure: CLOSED MANIPULATION LEFT KNEE;  Surgeon: Ollen Gross, MD;  Location: [facility];  Service: Orthopedics;  Laterality: Left;    There were no vitals filed for this visit.  Visit Diagnosis:  Left knee pain  Weakness  Abnormality of gait  Swelling of limb  Stiffness of left knee      Subjective Assessment - 05/03/15 0911    Subjective Pt arrived without cane today. She feels fatique due to low haemoglobin level, however the left knee has less swelling and continues to drain and pt is wearing bandage   Patient is accompained by: Family member   Limitations Sitting   How long can you sit comfortably? not able to bend knee with sitting   How long can you stand comfortably? 30 minutes   How long can you walk comfortably? pt is using a crutch for longer distance   Currently in Pain? No/denies   Multiple Pain Sites No                         [facility]  Adult PT Treatment/Exercise - 05/03/15 0001    Exercises   Exercises Knee/Hip   Knee/Hip Exercises: Stretches   Quad Stretch Left;10 seconds;2 reps  on stairs   Knee/Hip Exercises: Aerobic   Nustep for bending Level 1x 10  seat #7, arms 10   Knee/Hip Exercises: Standing   Forward Step Up Left;20 reps;Hand Hold: 2;Step Height: 6"  1st set 10 in one min, 2nd set of 10 in 40sec   Stairs Dynamic flexion 2 x 1 min   Rebounder weightshift in 3 directions x 1 min each   Other Standing Knee Exercises squatting in available range with B UE holding on stairs, chair behind  2 x 10 with chair behind   Modalities   Modalities Moist Heat   Moist Heat Therapy   Number Minutes Moist Heat 15 Minutes   Moist Heat Location Knee                  PT Short Term Goals - 04/29/15 4098    PT SHORT TERM GOAL #1   Title be independent in initial HEP   Time 3    Period Weeks   Status Achieved   PT SHORT TERM GOAL #2   Title demonstrate Lt knee AROM flexion > or = to 75 degrees   Time 3   Period Weeks   Status On-going           PT Long Term Goals - 04/29/15 1055    PT LONG TERM GOAL #1   Title be independent in advanced HEP   Time 8   Period Weeks   Status On-going   PT LONG TERM GOAL #2   Title improve FOTO to < or = to 48% limitaiton   Time 8   Period Weeks   Status On-going   PT LONG TERM GOAL #3   Title demonstrate Lt knee AROM flexion to > or = to 95 degrees to improve sitting   Time 8   Period Weeks   Status On-going   PT LONG TERM GOAL #4   Title demonstrate 4+/5 Lt hip strength to improve gait   Time 8   Period Weeks   Status On-going   PT LONG TERM GOAL #5   Title wean from crutch and demonstrate Lt knee flexion with swing through phase of gait on level surface   Period Weeks   Status On-going               Plan - 05/03/15 1191    Clinical Impression Statement Pt with left knee edema that continues to drain. Pt is taking antibiotica for infection. Pt will continue to benefit from skilled PT to improve limited ROM/flexibility. Pt was able to perform step ups today on 6inch step with B UE support.    Pt will benefit from skilled therapeutic intervention in order to improve on the following deficits Abnormal gait;Decreased activity tolerance;Difficulty walking;Increased edema;Impaired flexibility;Decreased strength;Hypomobility;Decreased range of motion;Decreased endurance;Decreased mobility;Decreased scar mobility;Increased muscle spasms;Pain;Increased fascial restricitons   Rehab Potential Excellent   Clinical Impairments Affecting Rehab Potential Lt knee manipulation TKA replacement in July 2016, Patellatendon rupture, Haematoma, open wound,    PT Frequency 3x / week   PT Duration 8 weeks   PT Treatment/Interventions ADLs/Self Care Home Management;Cryotherapy;Electrical Stimulation;Gait  training;Ultrasound;Moist Heat;Stair training;Functional mobility training;Therapeutic activities;Therapeutic exercise;Neuromuscular re-education;Manual techniques;Patient/family education;Scar mobilization;Passive range of motion;Taping;Vasopneumatic Device   PT Next Visit Plan  AROM, edema management, Lt knee strength, gait training   Consulted and Agree with  Plan of Care Patient        Problem List Patient Active Problem List   Diagnosis Date Noted  . Arthrofibrosis of total knee arthroplasty (HCC) 03/25/2015  . Quadriceps tendon rupture 12/09/2014  . OA (osteoarthritis) of knee 11/26/2014    NAUMANN-HOUEGNIFIO,Domenic Schoenberger PTA 05/03/2015, 9:42 AM  [facility] [address], [address] [place], [place], [address] Phone: [phone]   Fax:  [phone]  Name: Fonda KinderJanet G Giorgio MRN: [id number] Date of Birth: [birth year]

## 2015-05-07 ENCOUNTER — Encounter: Payer: Medicare Other | Admitting: Physical Therapy

## 2015-05-08 ENCOUNTER — Ambulatory Visit: Payer: Medicare Other | Admitting: Physical Therapy

## 2015-05-08 ENCOUNTER — Encounter: Payer: Self-pay | Admitting: Physical Therapy

## 2015-05-08 DIAGNOSIS — R269 Unspecified abnormalities of gait and mobility: Secondary | ICD-10-CM | POA: Diagnosis not present

## 2015-05-08 DIAGNOSIS — M25662 Stiffness of left knee, not elsewhere classified: Secondary | ICD-10-CM | POA: Diagnosis not present

## 2015-05-08 DIAGNOSIS — R531 Weakness: Secondary | ICD-10-CM

## 2015-05-08 DIAGNOSIS — M25562 Pain in left knee: Secondary | ICD-10-CM

## 2015-05-08 DIAGNOSIS — M7989 Other specified soft tissue disorders: Secondary | ICD-10-CM

## 2015-05-08 NOTE — Therapy (Signed)
[facility] [address], [address] [place], [place], [address] Phone: [phone]   Fax:  [phone]  Physical Therapy Treatment  Patient Details  Name: Fonda KinderJanet G Oliveira MRN: [id number] Date of Birth: 1941-03-04 Referring Provider: Ollen GrossAluisio, Frank, MD  Encounter Date: 05/08/2015      PT End of Session - 05/08/15 0858    Visit Number 21   Number of Visits 29   Date for PT Re-Evaluation 05/22/15   PT Start Time 0846   PT Stop Time 0948   PT Time Calculation (min) 62 min   Activity Tolerance Patient tolerated treatment well   Behavior During Therapy [facility] for tasks assessed/performed      Past Medical History  Diagnosis Date  . PONV (postoperative nausea and vomiting)   . Bladder incontinence   . OSA on CPAP   . Arthritis     "qwhere; feet, knees, neck" (04/21/2012)  . Chronic back pain   . Closed angle glaucoma     "both eyes" (04/21/2012)  . Prediabetes   . Urinary tract bacterial infections   . Overactive bladder   . Numbness     fingertips bilat and feet bilat   . Peripheral neuropathy (HCC)     feet bilat   . Difficult intubation     with intubation on November 26, 2014  . Pneumonia     hx. of as child  . Anemia     Past Surgical History  Procedure Laterality Date  . Back surgery    . Knee arthroplasty  06/2008    "slipped on ice; torn ligaments & cartilage" (04/21/2012)  . Posterior lumbar fusion  04/21/2012  . Tonsillectomy and adenoidectomy  ~ 1950  . Abdominal hysterectomy  1980    "partial" (04/21/2012)  . Bladder suspension  1990  . Anterior fusion cervical spine  12/2005  . Tubal ligation  1970's  . Dilation and curettage of uterus  1980  . Appendectomy    . 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 Patellar tendon repair Left 12/10/2014    Procedure: REPAIR  PARTIAL QUAD TENDON TEAR;  Surgeon: Ollen GrossFrank Aluisio, MD;  [facility];  Service: Orthopedics;  Laterality: Left;  . Knee closed reduction Left 03/25/2015    Procedure: CLOSED MANIPULATION LEFT KNEE;  Surgeon: Ollen GrossFrank Aluisio, MD;  [facility];  Service: Orthopedics;  Laterality: Left;    There were no vitals filed for this visit.  Visit Diagnosis:  Left knee pain  Weakness  Abnormality of gait  Swelling of limb  Stiffness of left knee      Subjective Assessment - 05/08/15 0853    Subjective Pt reports not more using the cane, she is able to have the seat in the car closer due to increased flexion, she took the elevation of the toilet. Pt has bandage and still some drainage.   Patient is accompained by: Family member   Limitations Sitting   How long can you sit comfortably? not able to bend knee with sitting   How long can you stand comfortably? 30 minutes   How long can you walk comfortably? wallking is now limited by endurance not by pain in left knee   Patient Stated Goals bend the knee, walk without crutch   Currently in Pain? No/denies  [facility] Adult PT Treatment/Exercise - 05/08/15 0001    Ambulation/Gait   Ambulation/Gait Yes   Ambulation/Gait Assistance 6: Modified independent (Device/Increase time)   Assistive device None   Gait Pattern Step-to pattern;Decreased hip/knee flexion - left   Exercises   Exercises Knee/Hip   Knee/Hip Exercises: Stretches   Lobbyist Left;4 reps;20 seconds  on stairs   Other Knee/Hip Stretches seated knee flexion with overpressure 10x 10 seconds   Knee/Hip Exercises: Aerobic   Recumbent Bike L 1 x51min rocking to incr ROM   Knee/Hip Exercises: Standing   Forward Step Up Left;20 reps;Hand Hold: 2;Step Height: 6"  2nd set using cane instead of left rail   Rebounder weightshift in 3 directions x 1 min each   Modalities   Modalities Moist Heat   Moist Heat Therapy   Number Minutes Moist  Heat 15 Minutes   Moist Heat Location Knee   Manual Therapy   Passive ROM into flexion as tolerated                  PT Short Term Goals - 05/08/15 0905    PT SHORT TERM GOAL #1   Title be independent in initial HEP   Time 3   Period Weeks   Status Achieved   PT SHORT TERM GOAL #2   Title demonstrate Lt knee AROM flexion > or = to 75 degrees   Time 3   Period Weeks   Status On-going   PT SHORT TERM GOAL #3   Title --   PT SHORT TERM GOAL #4   Title --           PT Long Term Goals - 04/29/15 1055    PT LONG TERM GOAL #1   Title be independent in advanced HEP   Time 8   Period Weeks   Status On-going   PT LONG TERM GOAL #2   Title improve FOTO to < or = to 48% limitaiton   Time 8   Period Weeks   Status On-going   PT LONG TERM GOAL #3   Title demonstrate Lt knee AROM flexion to > or = to 95 degrees to improve sitting   Time 8   Period Weeks   Status On-going   PT LONG TERM GOAL #4   Title demonstrate 4+/5 Lt hip strength to improve gait   Time 8   Period Weeks   Status On-going   PT LONG TERM GOAL #5   Title wean from crutch and demonstrate Lt knee flexion with swing through phase of gait on level surface   Period Weeks   Status On-going               Plan - 05/08/15 0859    Clinical Impression Statement Pt wearing ace band and left knee edema is still draining, but less. Pt has to continue to take antibiotica until Sunday May 12, 2015. Pt will continue to benefit from skilled PT to improve ROM, strength and endurance.    Pt will benefit from skilled therapeutic intervention in order to improve on the following deficits Abnormal gait;Decreased activity tolerance;Difficulty walking;Increased edema;Impaired flexibility;Decreased strength;Hypomobility;Decreased range of motion;Decreased endurance;Decreased mobility;Decreased scar mobility;Increased muscle spasms;Pain;Increased fascial restricitons   Rehab Potential Excellent   Clinical  Impairments Affecting Rehab Potential Lt knee manipulation TKA replacement in July 2016, Patellatendon rupture, Haematoma, open wound,    PT Frequency 3x / week   PT Duration 8 weeks   PT Treatment/Interventions ADLs/Self Care Home Management;Cryotherapy;Electrical Stimulation;Gait  training;Ultrasound;Moist Heat;Stair training;Functional mobility training;Therapeutic activities;Therapeutic exercise;Neuromuscular re-education;Manual techniques;Patient/family education;Scar mobilization;Passive range of motion;Taping;Vasopneumatic Device   PT Next Visit Plan  AROM, edema management, Lt knee strength, gait training   Consulted and Agree with Plan of Care Patient        Problem List Patient Active Problem List   Diagnosis Date Noted  . Arthrofibrosis of total knee arthroplasty (HCC) 03/25/2015  . Quadriceps tendon rupture 12/09/2014  . OA (osteoarthritis) of knee 11/26/2014    NAUMANN-HOUEGNIFIO,Terena Bohan PTA 05/08/2015, 9:33 AM  [facility] [address] [place], [place], [address] Phone: [phone]   Fax:  [phone]  Name: ZAYA KESSENICH MRN: [id number] Date of Birth: 05/20/1940

## 2015-05-10 ENCOUNTER — Telehealth: Payer: Self-pay | Admitting: Oncology

## 2015-05-10 ENCOUNTER — Ambulatory Visit (HOSPITAL_BASED_OUTPATIENT_CLINIC_OR_DEPARTMENT_OTHER): Payer: Medicare Other | Admitting: Oncology

## 2015-05-10 ENCOUNTER — Ambulatory Visit: Payer: Medicare Other | Admitting: Physical Therapy

## 2015-05-10 ENCOUNTER — Encounter: Payer: Self-pay | Admitting: Physical Therapy

## 2015-05-10 VITALS — BP 144/58 | HR 81 | Temp 98.1°F | Resp 18 | Ht 70.0 in | Wt 189.5 lb

## 2015-05-10 DIAGNOSIS — M25662 Stiffness of left knee, not elsewhere classified: Secondary | ICD-10-CM | POA: Diagnosis not present

## 2015-05-10 DIAGNOSIS — D509 Iron deficiency anemia, unspecified: Secondary | ICD-10-CM

## 2015-05-10 DIAGNOSIS — M7989 Other specified soft tissue disorders: Secondary | ICD-10-CM

## 2015-05-10 DIAGNOSIS — D500 Iron deficiency anemia secondary to blood loss (chronic): Secondary | ICD-10-CM

## 2015-05-10 DIAGNOSIS — M25562 Pain in left knee: Secondary | ICD-10-CM | POA: Diagnosis not present

## 2015-05-10 DIAGNOSIS — R269 Unspecified abnormalities of gait and mobility: Secondary | ICD-10-CM | POA: Diagnosis not present

## 2015-05-10 DIAGNOSIS — L03119 Cellulitis of unspecified part of limb: Secondary | ICD-10-CM | POA: Diagnosis not present

## 2015-05-10 DIAGNOSIS — R531 Weakness: Secondary | ICD-10-CM

## 2015-05-10 NOTE — Consult Note (Signed)
Reason for Referral: Microcytic anemia.   HPI: 74 year old woman currently of [place] where she has lived for the last 25 years. She is a rather healthy woman and was in her usual state of health until she underwent a left total knee arthroplasty on 11/26/2014. Patient subsequently developed a few postoperative complications that resulted in patellar tendon tear and required another operation on 12/10/2014. During these surgeries, she did lose blood postoperatively but did not require any transfusions. She also had delayed wound healing and required a wound VAC and prolonged physical therapy. She also developed arthro-fibrosis of the left knee and required another manipulation under anesthesia. She also developed a hematoma after that that procedure. In the meantime, her hemoglobin has drifted to as low as 9.5 and was at 10.5 on 03/22/2015. Her most recent CBC was 9.4 on 04/26/2015. Her MCV was 74, RDW 16.9 and a normal platelet count. She did start on oral iron initially and recently in the last 2 weeks she had been increased to twice a day iron sulfate. She tolerated the iron without any complications. She denied any dyspepsia or constipation. She does feel some occasional fatigue and tiredness. She will also reported some nausea and poor appetite and lost close to 48 pounds. A lot of her weight loss is related to her operations and pain and poor by mouth intake related to that. She also been diagnosed with cellulitis and currently on Cipro which makes her feel poorly in general. Today she is feeling better and able to ambulate without any difficulties. She has not reported any chest pain or difficulty breathing. Her performance status is improving.  She does not report any headaches, blurry vision, syncope or seizures. She does not report any fevers, chills sweats. Does not report any cough, wheezing or hemoptysis. Does not report any nausea, vomiting, abdominal pain, hematochezia, melena. Does not  report any frequency, urgency or hesitancy. She does not report any skeletal complaints. Remaining review of systems unremarkable.   Past Medical History  Diagnosis Date  . PONV (postoperative nausea and vomiting)   . Bladder incontinence   . OSA on CPAP   . Arthritis     "qwhere; feet, knees, neck" (04/21/2012)  . Chronic back pain   . Closed angle glaucoma     "both eyes" (04/21/2012)  . Prediabetes   . Urinary tract bacterial infections   . Overactive bladder   . Numbness     fingertips bilat and feet bilat   . Peripheral neuropathy (HCC)     feet bilat   . Difficult intubation     with intubation on November 26, 2014  . Pneumonia     hx. of as child  . Anemia   :  Past Surgical History  Procedure Laterality Date  . Back surgery    . Knee arthroplasty  06/2008    "slipped on ice; torn ligaments & cartilage" (04/21/2012)  . Posterior lumbar fusion  04/21/2012  . Tonsillectomy and adenoidectomy  ~ 1950  . Abdominal hysterectomy  1980    "partial" (04/21/2012)  . Bladder suspension  1990  . Anterior fusion cervical spine  12/2005  . Tubal ligation  1970's  . Dilation and curettage of uterus  1980  . Appendectomy    . 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 Patellar tendon repair Left 12/10/2014  Procedure: REPAIR PARTIAL QUAD TENDON TEAR;  Surgeon: Ollen GrossFrank Aluisio, MD;  Location: [facility];  Service: Orthopedics;  Laterality: Left;  . Knee closed reduction Left 03/25/2015    Procedure: CLOSED MANIPULATION LEFT KNEE;  Surgeon: Ollen GrossFrank Aluisio, MD;  Location: [facility];  Service: Orthopedics;  Laterality: Left;  :   Current outpatient prescriptions:  .  ACETYLCARNITINE PO, Take 1 capsule by mouth daily., Disp: , Rfl:  .  aspirin EC 81 MG tablet, Take 81 mg by mouth daily., Disp: , Rfl:  .  calcium carbonate (OS-CAL) 600 MG tablet, Take 600 mg by  mouth 2 (two) times daily with a meal., Disp: , Rfl:  .  Cholecalciferol (VITAMIN D3) 2000 units TABS, Take 1 tablet by mouth daily., Disp: , Rfl:  .  docusate sodium (COLACE) 100 MG capsule, Take 100 mg by mouth 2 (two) times daily., Disp: , Rfl:  .  ferrous sulfate 325 (65 FE) MG tablet, Take 325 mg by mouth daily with breakfast., Disp: , Rfl:  .  RESVERATROL PO, Take 1 capsule by mouth daily., Disp: , Rfl:  .  solifenacin (VESICARE) 10 MG tablet, Take 10 mg by mouth daily., Disp: , Rfl:  .  traMADol (ULTRAM) 50 MG tablet, Take 1-2 tablets (50-100 mg total) by mouth every 6 (six) hours as needed (mild pain)., Disp: 60 tablet, Rfl: 1 .  TURMERIC PO, Take 1 capsule by mouth every evening., Disp: , Rfl: :  Allergies  Allergen Reactions  . Oxycodone Rash  :  No family history on file.:  Social History   Social History  . Marital Status: Married    Spouse Name: N/A  . Number of Children: N/A  . Years of Education: N/A   Occupational History  . Not on file.   Social History Main Topics  . Smoking status: Never Smoker   . Smokeless tobacco: Never Used  . Alcohol Use: 0.6 oz/week    1 Glasses of wine per week     Comment: 04/21/2012 "glass of wine maybe once/wk"  . Drug Use: No  . Sexual Activity: No   Other Topics Concern  . Not on file   Social History Narrative  :  Pertinent items are noted in HPI.  Exam:  ECOG 1  Blood pressure 144/58, pulse 81, temperature 98.1 F (36.7 C), temperature source Oral, resp. rate 18, height 5\' 10"  (1.778 m), weight 189 lb 8 oz (85.957 kg), SpO2 100 %. General appearance: alert and cooperative Head: Normocephalic, without obvious abnormality Throat: lips, mucosa, and tongue normal; teeth and gums normal Neck: no adenopathy Back: negative Resp: clear to auscultation bilaterally Chest wall: no tenderness Cardio: regular rate and rhythm, S1, S2 normal, no murmur, click, rub or gallop GI: abnormal findings:  Soft, nontender with good  bowel sounds. No rebound or guarding. Extremities: Swelling noted in her left lower extremity up to the thigh. Pulses: 2+ and symmetric Skin: Skin color, texture, turgor normal. No rashes or lesions  CBC    Component Value Date/Time   WBC 4.8 03/22/2015 0845   RBC 4.36 03/22/2015 0845   HGB 10.5* 03/22/2015 0845   HCT 34.8* 03/22/2015 0845   PLT 294 03/22/2015 0845   MCV 79.8 03/22/2015 0845   MCH 24.1* 03/22/2015 0845   MCHC 30.2 03/22/2015 0845   RDW 15.8* 03/22/2015 0845   LYMPHSABS 1.9 12/09/2014 1637   MONOABS 0.5 12/09/2014 1637   EOSABS 0.1 12/09/2014 1637   BASOSABS 0.0 12/09/2014 1637  Chemistry      Component Value Date/Time   NA 140 12/09/2014 1637   K 4.2 12/09/2014 1637   CL 106 12/09/2014 1637   CO2 24 12/09/2014 1637   BUN 11 12/09/2014 1637   CREATININE 0.81 12/09/2014 1637      Component Value Date/Time   CALCIUM 9.0 12/09/2014 1637   ALKPHOS 49 11/19/2014 1440   AST 20 11/19/2014 1440   ALT 18 11/19/2014 1440   BILITOT 0.5 11/19/2014 1440      Assessment and Plan:   74 year old woman with the following issues:  1. Microcytic anemia with a hemoglobin of 9.4, MCV of 74 and RDW of 16.9. 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 She has no GI blood losses and up-to-date on her colonoscopies.  The etiology of her anemia is likely related to iron deficiency from blood losses postoperatively. She is currently on iron replacement but have been taken it twice a day only as of 2 weeks ago.  Options of treatment were reviewed today including continuing oral iron at the current dose and reevaluation in 2 months. Alternatively, I discussed with her the role of IV iron including risks and benefits. Risks would include infusion related  complications, arthralgias, myalgias and rarely anaphylaxis. Benefit would be a more rapid correction of her iron and hemoglobin.  After discussion today, she have elected to continue with oral iron and give it more time and reevaluate this in 2 months. If her hemoglobin does not correct at the time, we'll consider IV iron then.  2. Cellulitis of the leg: Seems to be improving on ciprofloxacin and likely contributing to her overall feeling poorly although she started feeling better and the recent days.  3. Follow-up will be in 2 months and repeat hemoglobin and iron studies.

## 2015-05-10 NOTE — Therapy (Signed)
[facility] [address] [place], [place], [address] Phone: [phone]   Fax:  [phone]  Physical Therapy Treatment  Patient Details  Name: Lauren English MRN: [id number] Date of Birth: 1940-11-23 Referring Provider: Ollen Gross, MD  Encounter Date: 05/10/2015      PT End of Session - 05/10/15 0948    Visit Number 22   Number of Visits 29   Date for PT Re-Evaluation 05/22/15   PT Start Time 0928   PT Stop Time 1032   PT Time Calculation (min) 64 min   Activity Tolerance Patient tolerated treatment well   Behavior During Therapy [facility] for tasks assessed/performed      Past Medical History  Diagnosis Date  . PONV (postoperative nausea and vomiting)   . Bladder incontinence   . OSA on CPAP   . Arthritis     "qwhere; feet, knees, neck" (04/21/2012)  . Chronic back pain   . Closed angle glaucoma     "both eyes" (04/21/2012)  . Prediabetes   . Urinary tract bacterial infections   . Overactive bladder   . Numbness     fingertips bilat and feet bilat   . Peripheral neuropathy (HCC)     feet bilat   . Difficult intubation     with intubation on November 26, 2014  . Pneumonia     hx. of as child  . Anemia     Past Surgical History  Procedure Laterality Date  . Back surgery    . Knee arthroplasty  06/2008    "slipped on ice; torn ligaments & cartilage" (04/21/2012)  . Posterior lumbar fusion  04/21/2012  . Tonsillectomy and adenoidectomy  ~ 1950  . Abdominal hysterectomy  1980    "partial" (04/21/2012)  . Bladder suspension  1990  . Anterior fusion cervical spine  12/2005  . Tubal ligation  1970's  . Dilation and curettage of uterus  1980  . Appendectomy    . 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 Patellar tendon repair Left 12/10/2014    Procedure: REPAIR  PARTIAL QUAD TENDON TEAR;  Surgeon: Ollen Gross, MD;  [facility]: [facility];  Service: Orthopedics;  Laterality: Left;  . Knee closed reduction Left 03/25/2015    Procedure: CLOSED MANIPULATION LEFT KNEE;  Surgeon: Ollen Gross, MD;  Location: [facility];  Service: Orthopedics;  Laterality: Left;    There were no vitals filed for this visit.  Visit Diagnosis:  Left knee pain  Weakness  Abnormality of gait  Swelling of limb  Stiffness of left knee      Subjective Assessment - 05/10/15 0943    Subjective Pt felt nauseated yesterday the blodpressure was low and heart rate was up, pt contacted the parish nurse for advise. Today she feels better, she is aware of her low heamaglobin what may have caused it. However, the left knee is improving swelling is definetely down, but still draining   Patient is accompained by: Family member   Limitations Sitting   How long can you sit comfortably? not able to bend knee with sitting   How long can you stand comfortably? 30 minutes   How long can you walk comfortably? wallking is now limited by endurance not by pain in left knee   Patient Stated Goals bend the knee, walk without crutch  Currently in Pain? No/denies                         [facility] Adult PT Treatment/Exercise - 05/10/15 0001    Ambulation/Gait   Ambulation/Gait Yes   Ambulation/Gait Assistance 6: Modified independent (Device/Increase time)   Ambulation Distance (Feet) 150 Feet   Assistive device None   Gait Pattern Step-to pattern;Decreased hip/knee flexion - left   Stairs Yes   Stairs Assistance 6: Modified independent (Device/Increase time)   Stair Management Technique Two rails;Step to pattern  step to pattern -descending, alternating ascending   Number of Stairs 12   Height of Stairs 6   Gait Comments slow cadence and limbing gait   Exercises   Exercises Knee/Hip   Knee/Hip Exercises: Stretches   Lobbyist Left;4 reps;20 seconds   Other Knee/Hip  Stretches seated knee flexion with overpressure 10x 10 seconds   Knee/Hip Exercises: Aerobic   Recumbent Bike L 1 x70min rocking to incr ROM   Knee/Hip Exercises: Standing   Forward Step Up Left;20 reps;Hand Hold: 2;Step Height: 6"   Rebounder weightshift in 3 directions x 1 min each   Modalities   Modalities Moist Heat   Moist Heat Therapy   Number Minutes Moist Heat 15 Minutes   Moist Heat Location Knee   Manual Therapy   Passive ROM into flexion as tolerated                  PT Short Term Goals - 05/08/15 0905    PT SHORT TERM GOAL #1   Title be independent in initial HEP   Time 3   Period Weeks   Status Achieved   PT SHORT TERM GOAL #2   Title demonstrate Lt knee AROM flexion > or = to 75 degrees   Time 3   Period Weeks   Status On-going   PT SHORT TERM GOAL #3   Title --   PT SHORT TERM GOAL #4   Title --           PT Long Term Goals - 04/29/15 1055    PT LONG TERM GOAL #1   Title be independent in advanced HEP   Time 8   Period Weeks   Status On-going   PT LONG TERM GOAL #2   Title improve FOTO to < or = to 48% limitaiton   Time 8   Period Weeks   Status On-going   PT LONG TERM GOAL #3   Title demonstrate Lt knee AROM flexion to > or = to 95 degrees to improve sitting   Time 8   Period Weeks   Status On-going   PT LONG TERM GOAL #4   Title demonstrate 4+/5 Lt hip strength to improve gait   Time 8   Period Weeks   Status On-going   PT LONG TERM GOAL #5   Title wean from crutch and demonstrate Lt knee flexion with swing through phase of gait on level surface   Period Weeks   Status On-going               Plan - 05/10/15 0949    Clinical Impression Statement Pt wears ace band and left knee edema is still draining, but less. Pt has to continue to take her antibiotica until sunday 1, 2017. Pt will continue to benfit from skilled PT to improve ROM, strength and endurance   Pt will benefit from skilled therapeutic intervention in  order to  improve on the following deficits Abnormal gait;Decreased activity tolerance;Difficulty walking;Increased edema;Impaired flexibility;Decreased strength;Hypomobility;Decreased range of motion;Decreased endurance;Decreased mobility;Decreased scar mobility;Increased muscle spasms;Pain;Increased fascial restricitons   Rehab Potential Excellent   Clinical Impairments Affecting Rehab Potential Lt knee manipulation TKA replacement in July 2016, Patellatendon rupture, Haematoma, open wound,    PT Frequency 3x / week   PT Duration 8 weeks   PT Treatment/Interventions ADLs/Self Care Home Management;Cryotherapy;Electrical Stimulation;Gait training;Ultrasound;Moist Heat;Stair training;Functional mobility training;Therapeutic activities;Therapeutic exercise;Neuromuscular re-education;Manual techniques;Patient/family education;Scar mobilization;Passive range of motion;Taping;Vasopneumatic Device   PT Next Visit Plan  Pt has MD appointment on Thursday January 5th. AROM, edema management, Lt knee strength, gait training   Consulted and Agree with Plan of Care Patient        Problem List Patient Active Problem List   Diagnosis Date Noted  . Arthrofibrosis of total knee arthroplasty (HCC) 03/25/2015  . Quadriceps tendon rupture 12/09/2014  . OA (osteoarthritis) of knee 11/26/2014    NAUMANN-HOUEGNIFIO,Natosha Bou PTA 05/10/2015, 10:17 AM  [facility] [address] [place], [place], [address] Phone: [phone]   Fax:  [phone]  Name: Fonda KinderJanet G Fredenburg MRN: [id number] Date of Birth: 05-10-1941

## 2015-05-10 NOTE — Telephone Encounter (Signed)
Gave patient avs report and appointments for March  °

## 2015-05-10 NOTE — Progress Notes (Signed)
Please see consult note.  

## 2015-05-14 ENCOUNTER — Ambulatory Visit: Payer: Medicare Other | Attending: Orthopedic Surgery

## 2015-05-14 DIAGNOSIS — M25662 Stiffness of left knee, not elsewhere classified: Secondary | ICD-10-CM | POA: Insufficient documentation

## 2015-05-14 DIAGNOSIS — R531 Weakness: Secondary | ICD-10-CM

## 2015-05-14 DIAGNOSIS — M25562 Pain in left knee: Secondary | ICD-10-CM | POA: Diagnosis not present

## 2015-05-14 DIAGNOSIS — M7989 Other specified soft tissue disorders: Secondary | ICD-10-CM | POA: Diagnosis not present

## 2015-05-14 DIAGNOSIS — R269 Unspecified abnormalities of gait and mobility: Secondary | ICD-10-CM | POA: Diagnosis not present

## 2015-05-14 NOTE — Therapy (Signed)
[facility] [address], [address] [place], [place], [address] Phone: [phone]   Fax:  [phone]  Physical Therapy Treatment  Patient Details  Name: Lauren English MRN: [id number] Date of Birth: 1940/05/16 Referring Provider: Ollen Gross, MD  Encounter Date: 05/14/2015      PT End of Session - 05/14/15 0758    Visit Number 23   Number of Visits 29   Date for PT Re-Evaluation 05/22/15   PT Start Time 0730   PT Stop Time 0815   PT Time Calculation (min) 45 min   Activity Tolerance Patient tolerated treatment well   Behavior During Therapy [facility] for tasks assessed/performed      Past Medical History  Diagnosis Date  . PONV (postoperative nausea and vomiting)   . Bladder incontinence   . OSA on CPAP   . Arthritis     "qwhere; feet, knees, neck" (04/21/2012)  . Chronic back pain   . Closed angle glaucoma     "both eyes" (04/21/2012)  . Prediabetes   . Urinary tract bacterial infections   . Overactive bladder   . Numbness     fingertips bilat and feet bilat   . Peripheral neuropathy (HCC)     feet bilat   . Difficult intubation     with intubation on November 26, 2014  . Pneumonia     hx. of as child  . Anemia     Past Surgical History  Procedure Laterality Date  . Back surgery    . Knee arthroplasty  06/2008    "slipped on ice; torn ligaments & cartilage" (04/21/2012)  . Posterior lumbar fusion  04/21/2012  . Tonsillectomy and adenoidectomy  ~ 1950  . Abdominal hysterectomy  1980    "partial" (04/21/2012)  . Bladder suspension  1990  . Anterior fusion cervical spine  12/2005  . Tubal ligation  1970's  . Dilation and curettage of uterus  1980  . Appendectomy    . 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 Patellar tendon repair Left 12/10/2014    Procedure: REPAIR PARTIAL  QUAD TENDON TEAR;  Surgeon: Ollen Gross, MD;  Location: [facility];  Service: Orthopedics;  Laterality: Left;  . Knee closed reduction Left 03/25/2015    Procedure: CLOSED MANIPULATION LEFT KNEE;  Surgeon: Ollen Gross, MD;  Location: [facility];  Service: Orthopedics;  Laterality: Left;    There were no vitals filed for this visit.  Visit Diagnosis:  Left knee pain  Weakness  Abnormality of gait  Swelling of limb  Stiffness of left knee      Subjective Assessment - 05/14/15 0730    Subjective Pt saw hemotologist last week.  No need to do iron infusions yet.  Let hemoglobin build gradually.  Pt will see Dr Lequita Halt on Thursday.     Currently in Pain? No/denies                         [facility] Adult PT Treatment/Exercise - 05/14/15 0001    Knee/Hip Exercises: Aerobic   Recumbent Bike L 1 x66min rocking to incr ROM   Knee/Hip Exercises: Standing   Forward Step Up Left;20 reps;Hand Hold: 2;Step Height: 6"   Rebounder weightshift in 3 directions x 1 min each   Other Standing Knee Exercises squatting in available range  with B UE holding on stairs, chair behind  2 x 10 with chair behind   Modalities   Modalities Moist Heat   Moist Heat Therapy   Number Minutes Moist Heat 15 Minutes   Moist Heat Location Knee   Manual Therapy   Passive ROM into flexion as tolerated                  PT Short Term Goals - 05/08/15 0905    PT SHORT TERM GOAL #1   Title be independent in initial HEP   Time 3   Period Weeks   Status Achieved   PT SHORT TERM GOAL #2   Title demonstrate Lt knee AROM flexion > or = to 75 degrees   Time 3   Period Weeks   Status On-going   PT SHORT TERM GOAL #3   Title --   PT SHORT TERM GOAL #4   Title --           PT Long Term Goals - 05/14/15 16100728    PT LONG TERM GOAL #1   Title be independent in advanced HEP   Time 8   Period Weeks   Status On-going   PT LONG TERM GOAL #3   Title demonstrate Lt knee AROM flexion to > or = to  95 degrees to improve sitting   Time 8   Period Weeks   Status On-going   PT LONG TERM GOAL #5   Title wean from crutch and demonstrate Lt knee flexion with swing through phase of gait on level surface   Time 8   Period Weeks   Status On-going  not using crutch, still limited knee flexion, this is improving               Plan - 05/14/15 0731    Clinical Impression Statement Edema is significantly improved in Lt kneewith normal color in lower leg and no pitting edema today. Pt with continued limited Lt knee AROM and this is improving as edema reduces.  Pt without knee pain. Pt with continued difficulty with 6" step up with max use of UEs required.  Pt with gait abnormality due to reduced Lt knee AROM flexion.  Pt will continue to benefit from skilled PT for Lt knee AROM, strength and edema management to allow for return to prior level for function.   Pt will benefit from skilled therapeutic intervention in order to improve on the following deficits Abnormal gait;Decreased activity tolerance;Difficulty walking;Increased edema;Impaired flexibility;Decreased strength;Hypomobility;Decreased range of motion;Decreased endurance;Decreased mobility;Decreased scar mobility;Increased muscle spasms;Pain;Increased fascial restricitons   Rehab Potential Excellent   Clinical Impairments Affecting Rehab Potential Lt knee manipulation TKA replacement in July 2016, Patellatendon rupture, Haematoma, open wound,    PT Frequency 3x / week   PT Duration 8 weeks   PT Treatment/Interventions ADLs/Self Care Home Management;Cryotherapy;Electrical Stimulation;Gait training;Ultrasound;Moist Heat;Stair training;Functional mobility training;Therapeutic activities;Therapeutic exercise;Neuromuscular re-education;Manual techniques;Patient/family education;Scar mobilization;Passive range of motion;Taping;Vasopneumatic Device   PT Next Visit Plan  Pt has MD appointment on Thursday, route note to MD. AROM, edema management,  Lt knee strength, gait training        Problem List Patient Active Problem List   Diagnosis Date Noted  . Arthrofibrosis of total knee arthroplasty (HCC) 03/25/2015  . Quadriceps tendon rupture 12/09/2014  . OA (osteoarthritis) of knee 11/26/2014    Jmya Uliano, PT 05/14/2015, 7:59 AM  [facility] [address], [address] [place], [place], [address] Phone: [phone]   Fax:  [phone]  Name: Lauren English MRN: [id number] Date of Birth: 05/06/1941

## 2015-05-16 ENCOUNTER — Ambulatory Visit: Payer: Medicare Other

## 2015-05-16 DIAGNOSIS — M25562 Pain in left knee: Secondary | ICD-10-CM | POA: Diagnosis not present

## 2015-05-16 DIAGNOSIS — M7989 Other specified soft tissue disorders: Secondary | ICD-10-CM | POA: Diagnosis not present

## 2015-05-16 DIAGNOSIS — M25662 Stiffness of left knee, not elsewhere classified: Secondary | ICD-10-CM

## 2015-05-16 DIAGNOSIS — Z471 Aftercare following joint replacement surgery: Secondary | ICD-10-CM | POA: Diagnosis not present

## 2015-05-16 DIAGNOSIS — R269 Unspecified abnormalities of gait and mobility: Secondary | ICD-10-CM | POA: Diagnosis not present

## 2015-05-16 DIAGNOSIS — Z96652 Presence of left artificial knee joint: Secondary | ICD-10-CM | POA: Diagnosis not present

## 2015-05-16 DIAGNOSIS — R531 Weakness: Secondary | ICD-10-CM

## 2015-05-16 NOTE — Therapy (Signed)
[facility] [address] [place], [place], [address] Phone: [phone]   Fax:  [phone]  Physical Therapy Treatment  Patient Details  Name: Lauren English MRN: [id number] Date of Birth: 10-15-1940 Referring Provider: Ollen Gross, MD  Encounter Date: 05/16/2015      PT End of Session - 05/16/15 0803    Visit Number 24   Number of Visits 29   Date for PT Re-Evaluation 05/22/15   PT Start Time 0730   PT Stop Time 0820   PT Time Calculation (min) 50 min   Activity Tolerance Patient tolerated treatment well   Behavior During Therapy [facility] for tasks assessed/performed      Past Medical History  Diagnosis Date  . PONV (postoperative nausea and vomiting)   . Bladder incontinence   . OSA on CPAP   . Arthritis     "qwhere; feet, knees, neck" (04/21/2012)  . Chronic back pain   . Closed angle glaucoma     "both eyes" (04/21/2012)  . Prediabetes   . Urinary tract bacterial infections   . Overactive bladder   . Numbness     fingertips bilat and feet bilat   . Peripheral neuropathy (HCC)     feet bilat   . Difficult intubation     with intubation on November 26, 2014  . Pneumonia     hx. of as child  . Anemia     Past Surgical History  Procedure Laterality Date  . Back surgery    . Knee arthroplasty  06/2008    "slipped on ice; torn ligaments & cartilage" (04/21/2012)  . Posterior lumbar fusion  04/21/2012  . Tonsillectomy and adenoidectomy  ~ 1950  . Abdominal hysterectomy  1980    "partial" (04/21/2012)  . Bladder suspension  1990  . Anterior fusion cervical spine  12/2005  . Tubal ligation  1970's  . Dilation and curettage of uterus  1980  . Appendectomy    . 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 Patellar tendon repair Left 12/10/2014    Procedure: REPAIR PARTIAL  QUAD TENDON TEAR;  Surgeon: Ollen Gross, MD;  Location: [facility];  Service: Orthopedics;  Laterality: Left;  . Knee closed reduction Left 03/25/2015    Procedure: CLOSED MANIPULATION LEFT KNEE;  Surgeon: Ollen Gross, MD;  Location: [facility];  Service: Orthopedics;  Laterality: Left;    There were no vitals filed for this visit.  Visit Diagnosis:  Weakness - Plan: PT plan of care cert/re-cert  Abnormality of gait - Plan: PT plan of care cert/re-cert  Swelling of limb - Plan: PT plan of care cert/re-cert  Stiffness of left knee - Plan: PT plan of care cert/re-cert      Subjective Assessment - 05/16/15 0732    Subjective A lot of swelling yesterday.  Pt reports that she did a lot of standing and walking so that might have contributed.  Fatigued with up/down the steps to do laundry.  Seeing MD today.   Currently in Pain? No/denies            [facility] PT Assessment - 05/16/15 0001    Assessment   Medical Diagnosis S/P left total knee arthroplasty, wound debridement, Lt knee manipulation    Onset Date/Surgical Date 03/25/15   Next MD Visit 05/16/15   Prior Function  Level of Independence Independent with household mobility with device;Independent with basic ADLs   Vocation Retired   CopyCognition   Overall Cognitive Status Within Functional Limits for tasks assessed   Observation/Other Assessments-Edema    Edema Circumferential   Circumferential Edema   Circumferential - Left  --  not measured.  Pt wearing ace wrap.  pitting edema in Lt leg   ROM / Strength   AROM / PROM / Strength AROM   AROM   AROM Assessment Site Knee   Right/Left Knee Left   Left Knee Extension 0   Left Knee Flexion 45   PROM   PROM Assessment Site Knee   Right/Left Knee Left   Left Knee Extension 0   Left Knee Flexion 55   Palpation   Palpation comment pitting edema in the Lt LE about the knee and ankle, warmth about the Lt knee joint   Ambulation/Gait   Ambulation/Gait Yes   Ambulation/Gait Assistance  6: Modified independent (Device/Increase time)   Assistive device None   Gait Pattern Step-to pattern;Decreased hip/knee flexion - left                     [facility] Adult PT Treatment/Exercise - 05/16/15 0001    Knee/Hip Exercises: Aerobic   Recumbent Bike L 1 x3910min rocking to incr ROM   Knee/Hip Exercises: Standing   Forward Step Up Left;20 reps;Hand Hold: 2;Step Height: 6"   Rebounder weightshift in 3 directions x 1 min each   Other Standing Knee Exercises squatting in available range with B UE holding on stairs, chair behind  2 x 10 with chair behind   Modalities   Modalities Moist Heat   Moist Heat Therapy   Number Minutes Moist Heat 15 Minutes   Moist Heat Location Knee   Manual Therapy   Passive ROM into flexion as tolerated                  PT Short Term Goals - 05/08/15 0905    PT SHORT TERM GOAL #1   Title be independent in initial HEP   Time 3   Period Weeks   Status Achieved   PT SHORT TERM GOAL #2   Title demonstrate Lt knee AROM flexion > or = to 75 degrees   Time 3   Period Weeks   Status On-going   PT SHORT TERM GOAL #3   Title --   PT SHORT TERM GOAL #4   Title --           PT Long Term Goals - 05/16/15 [id number]    PT LONG TERM GOAL #1   Title be independent in advanced HEP   Time 8   Period Weeks   Status On-going   PT LONG TERM GOAL #2   Title improve FOTO to < or = to 48% limitaiton   Time 8   Period Weeks   Status On-going   PT LONG TERM GOAL #3   Title demonstrate Lt knee AROM flexion to > or = to 95 degrees to improve sitting   Time 8   Period Weeks   Status On-going  PROM 55 degrees   PT LONG TERM GOAL #4   Title demonstrate 4+/5 Lt hip strength to improve gait   Time 8   Period Weeks   Status On-going   PT LONG TERM GOAL #5   Title wean from crutch and demonstrate Lt knee flexion with swing through phase of gait on level  surface   Time 8   Period Weeks   Status On-going               Plan -  05/16/15 0735    Clinical Impression Statement Edema is significantly improved in the Lt knee with normal color.  Mild pitting edema in Lt ankle today.  Pt with continued limited Lt knee AROM and this is slow to improve due to set back with infection and continued edema.  Pt without knee pain.  Pt with difficulty with 6" step-up with max use of UEs required.  Pt with gait abnormality due to inability to bend Lt knee with swing through phase of gait.  Pt will continue to benefit from skilled PT for Lt knee AROM, strength and edema management to allow for return to prior level function, normalize gait and negotiate steps without substitution.   Pt will benefit from skilled therapeutic intervention in order to improve on the following deficits Abnormal gait;Decreased activity tolerance;Difficulty walking;Increased edema;Impaired flexibility;Decreased strength;Hypomobility;Decreased range of motion;Decreased endurance;Decreased mobility;Decreased scar mobility;Increased muscle spasms;Pain;Increased fascial restricitons   Rehab Potential Good   Clinical Impairments Affecting Rehab Potential Lt knee manipulation TKA replacement in July 2016, Patellatendon rupture, Haematoma, open wound,    PT Frequency 2x / week   PT Duration 8 weeks   PT Treatment/Interventions ADLs/Self Care Home Management;Cryotherapy;Electrical Stimulation;Gait training;Ultrasound;Moist Heat;Stair training;Functional mobility training;Therapeutic activities;Therapeutic exercise;Neuromuscular re-education;Manual techniques;Patient/family education;Scar mobilization;Passive range of motion;Taping;Vasopneumatic Device   PT Next Visit Plan Lt knee AROM progression, edema management, Lt knee strength, gait training   Consulted and Agree with Plan of Care Patient        Problem List Patient Active Problem List   Diagnosis Date Noted  . Arthrofibrosis of total knee arthroplasty (HCC) 03/25/2015  . Quadriceps tendon rupture 12/09/2014  .  OA (osteoarthritis) of knee 11/26/2014    Timon Geissinger, PT 05/16/2015, 8:05 AM  [facility] [address] [place], [place], [address] Phone: [phone]   Fax:  [phone]  Name: RILEE KNOLL MRN: [id number] Date of Birth: 06/09/1940

## 2015-05-17 ENCOUNTER — Other Ambulatory Visit: Payer: Self-pay

## 2015-05-17 DIAGNOSIS — Z1231 Encounter for screening mammogram for malignant neoplasm of breast: Secondary | ICD-10-CM

## 2015-05-22 ENCOUNTER — Ambulatory Visit: Payer: Medicare Other

## 2015-05-22 DIAGNOSIS — R531 Weakness: Secondary | ICD-10-CM | POA: Diagnosis not present

## 2015-05-22 DIAGNOSIS — R269 Unspecified abnormalities of gait and mobility: Secondary | ICD-10-CM

## 2015-05-22 DIAGNOSIS — M25562 Pain in left knee: Secondary | ICD-10-CM | POA: Diagnosis not present

## 2015-05-22 DIAGNOSIS — M25662 Stiffness of left knee, not elsewhere classified: Secondary | ICD-10-CM

## 2015-05-22 DIAGNOSIS — M7989 Other specified soft tissue disorders: Secondary | ICD-10-CM | POA: Diagnosis not present

## 2015-05-22 NOTE — Therapy (Signed)
[facility] [address], [address] [place], [place], [address] Phone: [phone]   Fax:  [phone]  Physical Therapy Treatment  Patient Details  Name: Lauren English MRN: [id number] Date of Birth: 01/21/1941 Referring Provider: Ollen Gross, MD  Encounter Date: 05/22/2015      PT End of Session - 05/22/15 0922    Visit Number 25   Number of Visits 29   Date for PT Re-Evaluation 07/12/15   PT Start Time 0842   PT Stop Time 0940   PT Time Calculation (min) 58 min   Activity Tolerance Patient tolerated treatment well   Behavior During Therapy [facility] for tasks assessed/performed      Past Medical History  Diagnosis Date  . PONV (postoperative nausea and vomiting)   . Bladder incontinence   . OSA on CPAP   . Arthritis     "qwhere; feet, knees, neck" (04/21/2012)  . Chronic back pain   . Closed angle glaucoma     "both eyes" (04/21/2012)  . Prediabetes   . Urinary tract bacterial infections   . Overactive bladder   . Numbness     fingertips bilat and feet bilat   . Peripheral neuropathy (HCC)     feet bilat   . Difficult intubation     with intubation on November 26, 2014  . Pneumonia     hx. of as child  . Anemia     Past Surgical History  Procedure Laterality Date  . Back surgery    . Knee arthroplasty  06/2008    "slipped on ice; torn ligaments & cartilage" (04/21/2012)  . Posterior lumbar fusion  04/21/2012  . Tonsillectomy and adenoidectomy  ~ 1950  . Abdominal hysterectomy  1980    "partial" (04/21/2012)  . Bladder suspension  1990  . Anterior fusion cervical spine  12/2005  . Tubal ligation  1970's  . Dilation and curettage of uterus  1980  . Appendectomy    . 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 Patellar tendon repair Left 12/10/2014    Procedure: REPAIR PARTIAL  QUAD TENDON TEAR;  Surgeon: Ollen Gross, MD;  Location: [facility];  Service: Orthopedics;  Laterality: Left;  . Knee closed reduction Left 03/25/2015    Procedure: CLOSED MANIPULATION LEFT KNEE;  Surgeon: Ollen Gross, MD;  Location: [facility];  Service: Orthopedics;  Laterality: Left;    There were no vitals filed for this visit.  Visit Diagnosis:  Weakness  Abnormality of gait  Swelling of limb  Stiffness of left knee      Subjective Assessment - 05/22/15 0848    Subjective Saw MD last week.  He wants to see more flexion.     Patient Stated Goals bend the knee, walk without crutch   Currently in Pain? No/denies                         [facility] Adult PT Treatment/Exercise - 05/22/15 0001    Knee/Hip Exercises: Stretches   Quad Stretch Left;4 reps;20 seconds  using steps   Knee/Hip Exercises: Aerobic   Recumbent Bike L 1 x55min rocking to increase ROM   Knee/Hip Exercises: Machines for Strengthening   Total Gym Leg Press 70# bil 3x10, Lt only 45# 3x10  seat 7   Knee/Hip Exercises: Standing  Forward Step Up Left;20 reps;Hand Hold: 2;Step Height: 6"   Rebounder weightshift in 3 directions x 1 min each   Other Standing Knee Exercises squatting in available range with B UE holding on stairs, chair behind  2 x 10 with chair behind   Vasopneumatic   Number Minutes Vasopneumatic  15 minutes   Vasopnuematic Location  Knee   Vasopneumatic Pressure Medium   Vasopneumatic Temperature  3 snowflakes   Manual Therapy   Passive ROM into flexion as tolerated                  PT Short Term Goals - 05/22/15 0850    PT SHORT TERM GOAL #2   Title demonstrate Lt knee AROM flexion > or = to 75 degrees   Time 3   Period Weeks   Status On-going           PT Long Term Goals - 05/16/15 [id number]    PT LONG TERM GOAL #1   Title be independent in advanced HEP   Time 8   Period Weeks   Status On-going   PT LONG TERM GOAL #2   Title improve FOTO to < or = to 48%  limitaiton   Time 8   Period Weeks   Status On-going   PT LONG TERM GOAL #3   Title demonstrate Lt knee AROM flexion to > or = to 95 degrees to improve sitting   Time 8   Period Weeks   Status On-going  PROM 55 degrees   PT LONG TERM GOAL #4   Title demonstrate 4+/5 Lt hip strength to improve gait   Time 8   Period Weeks   Status On-going   PT LONG TERM GOAL #5   Title wean from crutch and demonstrate Lt knee flexion with swing through phase of gait on level surface   Time 8   Period Weeks   Status On-going               Plan - 05/22/15 [id number]    Clinical Impression Statement Edema is continued in the Lt knee and pitting in the Lt ankle. Pt with continued limited Lt knee AROM and this is slow to improve due to set-back with infection and abundant edema.  Pt without knee pain.  Pt with difficulty with 6" step up with max use of UEs required.  Pt wtih gait abnormality due to inability to bend Lt knee with swing through phase of gait.  Pt will continue to benefit from skilled PT for Lt knee AROM, strength and edema management to allow for return to prior level of function, normalize gait and negotiate steps without substitution.     Pt will benefit from skilled therapeutic intervention in order to improve on the following deficits Abnormal gait;Decreased activity tolerance;Difficulty walking;Increased edema;Impaired flexibility;Decreased strength;Hypomobility;Decreased range of motion;Decreased endurance;Decreased mobility;Decreased scar mobility;Increased muscle spasms;Pain;Increased fascial restricitons   Rehab Potential Good   Clinical Impairments Affecting Rehab Potential Lt knee manipulation TKA replacement in July 2016, Patellatendon rupture, Hematoma, open wound,    PT Frequency 2x / week   PT Duration 8 weeks   PT Treatment/Interventions ADLs/Self Care Home Management;Cryotherapy;Electrical Stimulation;Gait training;Ultrasound;Moist Heat;Stair training;Functional mobility  training;Therapeutic activities;Therapeutic exercise;Neuromuscular re-education;Manual techniques;Patient/family education;Scar mobilization;Passive range of motion;Taping;Vasopneumatic Device   PT Next Visit Plan Lt knee AROM progression, edema management, Lt knee strength, gait training   Consulted and Agree with Plan of Care Patient        Problem List Patient Active Problem  List   Diagnosis Date Noted  . Arthrofibrosis of total knee arthroplasty (HCC) 03/25/2015  . Quadriceps tendon rupture 12/09/2014  . OA (osteoarthritis) of knee 11/26/2014    Emine Lopata, PT 05/22/2015, 9:25 AM  [facility] [address] [place], [place], [address] Phone: [phone]   Fax:  [phone]  Name: Lauren English MRN: [id number] Date of Birth: 1941/03/28

## 2015-05-24 ENCOUNTER — Encounter: Payer: Self-pay | Admitting: Physical Therapy

## 2015-05-24 ENCOUNTER — Encounter: Payer: Medicare Other | Admitting: Physical Therapy

## 2015-05-24 ENCOUNTER — Ambulatory Visit: Payer: Medicare Other | Admitting: Physical Therapy

## 2015-05-24 DIAGNOSIS — M25562 Pain in left knee: Secondary | ICD-10-CM

## 2015-05-24 DIAGNOSIS — R531 Weakness: Secondary | ICD-10-CM

## 2015-05-24 DIAGNOSIS — M25662 Stiffness of left knee, not elsewhere classified: Secondary | ICD-10-CM

## 2015-05-24 DIAGNOSIS — R269 Unspecified abnormalities of gait and mobility: Secondary | ICD-10-CM | POA: Diagnosis not present

## 2015-05-24 DIAGNOSIS — M7989 Other specified soft tissue disorders: Secondary | ICD-10-CM | POA: Diagnosis not present

## 2015-05-24 NOTE — Therapy (Signed)
[facility] [address], [address] [place], [place], [address] Phone: [phone]   Fax:  [phone]  Physical Therapy Treatment  Patient Details  Name: Lauren English MRN: [id number] Date of Birth: 08/18/1940 Referring Provider: Ollen GrossAluisio, Frank, MD  Encounter Date: 05/24/2015      PT End of Session - 05/24/15 0904    Visit Number 26   Number of Visits 29   Date for PT Re-Evaluation 07/12/15   PT Start Time 0844   PT Stop Time 0945   PT Time Calculation (min) 61 min   Activity Tolerance Patient tolerated treatment well   Behavior During Therapy [facility] for tasks assessed/performed      Past Medical History  Diagnosis Date  . PONV (postoperative nausea and vomiting)   . Bladder incontinence   . OSA on CPAP   . Arthritis     "qwhere; feet, knees, neck" (04/21/2012)  . Chronic back pain   . Closed angle glaucoma     "both eyes" (04/21/2012)  . Prediabetes   . Urinary tract bacterial infections   . Overactive bladder   . Numbness     fingertips bilat and feet bilat   . Peripheral neuropathy (HCC)     feet bilat   . Difficult intubation     with intubation on November 26, 2014  . Pneumonia     hx. of as child  . Anemia     Past Surgical History  Procedure Laterality Date  . Back surgery    . Knee arthroplasty  06/2008    "slipped on ice; torn ligaments & cartilage" (04/21/2012)  . Posterior lumbar fusion  04/21/2012  . Tonsillectomy and adenoidectomy  ~ 1950  . Abdominal hysterectomy  1980    "partial" (04/21/2012)  . Bladder suspension  1990  . Anterior fusion cervical spine  12/2005  . Tubal ligation  1970's  . Dilation and curettage of uterus  1980  . Appendectomy    . 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 Patellar tendon repair Left 12/10/2014    Procedure: REPAIR PARTIAL  QUAD TENDON TEAR;  Surgeon: Ollen GrossFrank Aluisio, MD;  Location: [facility];  Service: Orthopedics;  Laterality: Left;  . Knee closed reduction Left 03/25/2015    Procedure: CLOSED MANIPULATION LEFT KNEE;  Surgeon: Ollen GrossFrank Aluisio, MD;  Location: [facility];  Service: Orthopedics;  Laterality: Left;    There were no vitals filed for this visit.  Visit Diagnosis:  Weakness  Abnormality of gait  Swelling of limb  Stiffness of left knee  Left knee pain      Subjective Assessment - 05/24/15 0902    Subjective Pt is pleased with her progression of left knee, no more drainage but skin is very fragile and needs to wear bandage    Patient is accompained by: Family member   Limitations Sitting   How long can you sit comfortably? not able to bend knee with sitting   How long can you stand comfortably? 30 minutes   How long can you walk comfortably? wallking is now limited by endurance not by pain in left knee   Patient Stated Goals bend the knee, walk without crutch   Currently in Pain? No/denies  [facility] Adult PT Treatment/Exercise - 05/24/15 0001    Exercises   Exercises Knee/Hip   Knee/Hip Exercises: Stretches   Quad Stretch Left;5 reps;10 seconds  in sitting distal and prox, Lt foot fixated by PTA    Knee/Hip Exercises: Aerobic   Recumbent Bike L 1 x98min rocking to increase ROM   Knee/Hip Exercises: Machines for Strengthening   Total Gym Leg Press 70# bil 2x10, 80# 1x10, Lt only 45# 3x10  seat # 7increase weight to 90# next visit   Knee/Hip Exercises: Standing   Lateral Step Up Left;2 sets;10 reps;Hand Hold: 2;Step Height: 6"   Forward Step Up Left;20 reps;Hand Hold: 2;Step Height: 6"   Rebounder weightshift in 3 directions x 1 min each   Other Standing Knee Exercises squatting in available range with B UE holding on stairs, chair behind  2 x 10 withchair behind   Vasopneumatic   Number Minutes Vasopneumatic  15 minutes   Vasopnuematic Location  Knee    Vasopneumatic Pressure Medium   Vasopneumatic Temperature  3 snowflakes   Manual Therapy   Passive ROM into flexion as tolerated                  PT Short Term Goals - 05/22/15 0850    PT SHORT TERM GOAL #2   Title demonstrate Lt knee AROM flexion > or = to 75 degrees   Time 3   Period Weeks   Status On-going           PT Long Term Goals - 05/16/15 1610    PT LONG TERM GOAL #1   Title be independent in advanced HEP   Time 8   Period Weeks   Status On-going   PT LONG TERM GOAL #2   Title improve FOTO to < or = to 48% limitaiton   Time 8   Period Weeks   Status On-going   PT LONG TERM GOAL #3   Title demonstrate Lt knee AROM flexion to > or = to 95 degrees to improve sitting   Time 8   Period Weeks   Status On-going  PROM 55 degrees   PT LONG TERM GOAL #4   Title demonstrate 4+/5 Lt hip strength to improve gait   Time 8   Period Weeks   Status On-going   PT LONG TERM GOAL #5   Title wean from crutch and demonstrate Lt knee flexion with swing through phase of gait on level surface   Time 8   Period Weeks   Status On-going               Plan - 05/24/15 0907    Clinical Impression Statement Edema is still present in Lt knee and pitting in the Lt ankle. Pt continued with limited Lt knee AROM and it is slow to improve due to set-bak with infection and abundant edema. Pt will continue to benefit from skilled PT to improve AROM, strength and edema managment to allow to return to prior level of function. and to normalize gait and negotiate stairs without substitution   Pt will benefit from skilled therapeutic intervention in order to improve on the following deficits Abnormal gait;Decreased activity tolerance;Difficulty walking;Increased edema;Impaired flexibility;Decreased strength;Hypomobility;Decreased range of motion;Decreased endurance;Decreased mobility;Decreased scar mobility;Increased muscle spasms;Pain;Increased fascial restricitons   Rehab  Potential Good   Clinical Impairments Affecting Rehab Potential Lt knee manipulation TKA replacement in July 2016, Patellatendon rupture, Hematoma, open wound,    PT Frequency 2x / week   PT Duration  8 weeks   PT Treatment/Interventions ADLs/Self Care Home Management;Cryotherapy;Electrical Stimulation;Gait training;Ultrasound;Moist Heat;Stair training;Functional mobility training;Therapeutic activities;Therapeutic exercise;Neuromuscular re-education;Manual techniques;Patient/family education;Scar mobilization;Passive range of motion;Taping;Vasopneumatic Device   PT Next Visit Plan Lt knee AROM progression, edema management, Lt knee strength, gait training   Consulted and Agree with Plan of Care Patient        Problem List Patient Active Problem List   Diagnosis Date Noted  . Arthrofibrosis of total knee arthroplasty (HCC) 03/25/2015  . Quadriceps tendon rupture 12/09/2014  . OA (osteoarthritis) of knee 11/26/2014    Lauren English,Lauren English  PTA  05/24/2015, 9:40 AM  [facility] [address] [place], [place], [address] Phone: [phone]   Fax:  [phone]  Name: Lauren English MRN: [id number] Date of Birth: Mar 16, 1941

## 2015-05-27 ENCOUNTER — Ambulatory Visit: Payer: Medicare Other | Admitting: Physical Therapy

## 2015-05-27 ENCOUNTER — Encounter: Payer: Self-pay | Admitting: Physical Therapy

## 2015-05-27 DIAGNOSIS — M25562 Pain in left knee: Secondary | ICD-10-CM

## 2015-05-27 DIAGNOSIS — M25662 Stiffness of left knee, not elsewhere classified: Secondary | ICD-10-CM | POA: Diagnosis not present

## 2015-05-27 DIAGNOSIS — R269 Unspecified abnormalities of gait and mobility: Secondary | ICD-10-CM | POA: Diagnosis not present

## 2015-05-27 DIAGNOSIS — M7989 Other specified soft tissue disorders: Secondary | ICD-10-CM

## 2015-05-27 DIAGNOSIS — R531 Weakness: Secondary | ICD-10-CM | POA: Diagnosis not present

## 2015-05-27 NOTE — Therapy (Signed)
[facility] [address] [place], [place], [address] Phone: [phone]   Fax:  [phone]  Physical Therapy Treatment  Patient Details  Name: Lauren English MRN: [id number] Date of Birth: 1941-03-01 Referring Provider: Ollen Gross, MD  Encounter Date: 05/27/2015      PT End of Session - 05/27/15 0909    Visit Number 27   Number of Visits 29   Date for PT Re-Evaluation 07/12/15   PT Start Time 0845   PT Stop Time 0947   PT Time Calculation (min) 62 min   Activity Tolerance Patient tolerated treatment well   Behavior During Therapy [facility] for tasks assessed/performed      Past Medical History  Diagnosis Date  . PONV (postoperative nausea and vomiting)   . Bladder incontinence   . OSA on CPAP   . Arthritis     "qwhere; feet, knees, neck" (04/21/2012)  . Chronic back pain   . Closed angle glaucoma     "both eyes" (04/21/2012)  . Prediabetes   . Urinary tract bacterial infections   . Overactive bladder   . Numbness     fingertips bilat and feet bilat   . Peripheral neuropathy (HCC)     feet bilat   . Difficult intubation     with intubation on November 26, 2014  . Pneumonia     hx. of as child  . Anemia     Past Surgical History  Procedure Laterality Date  . Back surgery    . Knee arthroplasty  06/2008    "slipped on ice; torn ligaments & cartilage" (04/21/2012)  . Posterior lumbar fusion  04/21/2012  . Tonsillectomy and adenoidectomy  ~ 1950  . Abdominal hysterectomy  1980    "partial" (04/21/2012)  . Bladder suspension  1990  . Anterior fusion cervical spine  12/2005  . Tubal ligation  1970's  . Dilation and curettage of uterus  1980  . Appendectomy    . 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 Patellar tendon repair Left 12/10/2014    Procedure: REPAIR PARTIAL  QUAD TENDON TEAR;  Surgeon: Ollen Gross, MD;  Location: [facility];  Service: Orthopedics;  Laterality: Left;  . Knee closed reduction Left 03/25/2015    Procedure: CLOSED MANIPULATION LEFT KNEE;  Surgeon: Ollen Gross, MD;  Location: [facility];  Service: Orthopedics;  Laterality: Left;    There were no vitals filed for this visit.  Visit Diagnosis:  Weakness  Abnormality of gait  Swelling of limb  Stiffness of left knee  Left knee pain      Subjective Assessment - 05/27/15 0859    Subjective Pt has not any bandage at left knee, no more drainage and skin is healing on the fragile sides. Pt complains of stiffness in left knee , but no complains of pain   Patient is accompained by: Family member   Limitations Sitting   How long can you sit comfortably? not able to bend knee with sitting   How long can you stand comfortably? 30 minutes   How long can you walk comfortably? wallking is now limited by endurance not by pain in left knee   Patient Stated Goals bend the knee, walk without crutch   Currently in Pain? No/denies  [facility] Adult PT Treatment/Exercise - 05/27/15 0001    Exercises   Exercises Knee/Hip   Knee/Hip Exercises: Stretches   Quad Stretch Left;3 reps;30 seconds  on stairs   Knee/Hip Exercises: Aerobic   Recumbent Bike L 1 x7310min rocking to increase ROM   Knee/Hip Exercises: Machines for Strengthening   Total Gym Leg Press 90# bil 3x10, Lt only 50# 3x10  seat #7 increase weigth to 100# with B LE   Knee/Hip Exercises: Standing   Lateral Step Up Left;2 sets;10 reps;Hand Hold: 2;Step Height: 6"   Forward Step Up Left;20 reps;Hand Hold: 2;Step Height: 6"   Rebounder weightshift in 3 directions x 1 min each   Other Standing Knee Exercises --   Vasopneumatic   Number Minutes Vasopneumatic  15 minutes   Vasopnuematic Location  Knee   Vasopneumatic Pressure Medium   Vasopneumatic Temperature  3 snowflakes   Manual Therapy   Manual  Therapy Soft tissue mobilization  to release tightness around left knee   Passive ROM into flexion as tolerated                  PT Short Term Goals - 05/27/15 0914    PT SHORT TERM GOAL #1   Title be independent in initial HEP   Time 3   Period Weeks   Status Achieved   PT SHORT TERM GOAL #2   Title demonstrate Lt knee AROM flexion > or = to 75 degrees   Period Weeks   Status On-going           PT Long Term Goals - 05/27/15 [id number]    PT LONG TERM GOAL #1   Title be independent in advanced HEP   Time 8   Period Weeks   Status On-going   PT LONG TERM GOAL #2   Title improve FOTO to < or = to 48% limitaiton   Time 8   Period Weeks   Status On-going   PT LONG TERM GOAL #3   Title demonstrate Lt knee AROM flexion to > or = to 95 degrees to improve sitting   Time 8   Period Weeks   Status On-going   PT LONG TERM GOAL #4   Title demonstrate 4+/5 Lt hip strength to improve gait   Time 8   Period Weeks   Status On-going   PT LONG TERM GOAL #5   Title wean from crutch and demonstrate Lt knee flexion with swing through phase of gait on level surface   Time 8   Period Weeks   Status On-going   PT LONG TERM GOAL #6   Title --   PT LONG TERM GOAL #7   Title --               Plan - 05/27/15 0910    Clinical Impression Statement Pt left leg is improving skin is not more shinny, but still edema in left knee present. Pt continued with limited Lt knee AROM and is slow to improve due to set-back with infection and abundant edema. Pt will continue to benefit from skilled PT to improve AROM, strength and edema managment to allow to return to prior level of function, and to normalize gait and negotiate stairs without substitution.   Pt will benefit from skilled therapeutic intervention in order to improve on the following deficits Abnormal gait;Decreased activity tolerance;Difficulty walking;Increased edema;Impaired flexibility;Decreased  strength;Hypomobility;Decreased range of motion;Decreased endurance;Decreased mobility;Decreased scar mobility;Increased muscle spasms;Pain;Increased fascial restricitons   Rehab Potential Good  Clinical Impairments Affecting Rehab Potential Lt knee manipulation TKA replacement in July 2016, Patellatendon rupture, Hematoma, open wound,    PT Frequency 2x / week   PT Duration 8 weeks   PT Treatment/Interventions ADLs/Self Care Home Management;Cryotherapy;Electrical Stimulation;Gait training;Ultrasound;Moist Heat;Stair training;Functional mobility training;Therapeutic activities;Therapeutic exercise;Neuromuscular re-education;Manual techniques;Patient/family education;Scar mobilization;Passive range of motion;Taping;Vasopneumatic Device   PT Next Visit Plan Lt knee AROM progression, edema management, Lt knee strength, gait training   Consulted and Agree with Plan of Care Patient        Problem List Patient Active Problem List   Diagnosis Date Noted  . Arthrofibrosis of total knee arthroplasty (HCC) 03/25/2015  . Quadriceps tendon rupture 12/09/2014  . OA (osteoarthritis) of knee 11/26/2014    NAUMANN-HOUEGNIFIO,Olan Kurek PTA 05/27/2015, 9:32 AM  [facility] [address] [place], [place], [address] Phone: [phone]   Fax:  [phone]  Name: Lauren English MRN: [id number] Date of Birth: 1940/11/19

## 2015-05-29 ENCOUNTER — Encounter: Payer: Self-pay | Admitting: Physical Therapy

## 2015-05-29 ENCOUNTER — Ambulatory Visit: Payer: Medicare Other | Admitting: Physical Therapy

## 2015-05-29 DIAGNOSIS — M25662 Stiffness of left knee, not elsewhere classified: Secondary | ICD-10-CM

## 2015-05-29 DIAGNOSIS — M7989 Other specified soft tissue disorders: Secondary | ICD-10-CM

## 2015-05-29 DIAGNOSIS — R269 Unspecified abnormalities of gait and mobility: Secondary | ICD-10-CM

## 2015-05-29 DIAGNOSIS — M25562 Pain in left knee: Secondary | ICD-10-CM | POA: Diagnosis not present

## 2015-05-29 DIAGNOSIS — R531 Weakness: Secondary | ICD-10-CM

## 2015-05-29 NOTE — Therapy (Signed)
[facility] [address], [address] [place], [place], [address] Phone: [phone]   Fax:  [phone]  Physical Therapy Treatment  Patient Details  Name: Lauren English MRN: [id number] Date of Birth: December 20, 1940 Referring Provider: Ollen Gross, MD  Encounter Date: 05/29/2015      PT End of Session - 05/29/15 0857    Visit Number 28   Number of Visits 29   Date for PT Re-Evaluation 07/12/15   PT Start Time 0843   PT Stop Time 0944   PT Time Calculation (min) 61 min   Activity Tolerance Patient tolerated treatment well   Behavior During Therapy [facility] for tasks assessed/performed      Past Medical History  Diagnosis Date  . PONV (postoperative nausea and vomiting)   . Bladder incontinence   . OSA on CPAP   . Arthritis     "qwhere; feet, knees, neck" (04/21/2012)  . Chronic back pain   . Closed angle glaucoma     "both eyes" (04/21/2012)  . Prediabetes   . Urinary tract bacterial infections   . Overactive bladder   . Numbness     fingertips bilat and feet bilat   . Peripheral neuropathy (HCC)     feet bilat   . Difficult intubation     with intubation on November 26, 2014  . Pneumonia     hx. of as child  . Anemia     Past Surgical History  Procedure Laterality Date  . Back surgery    . Knee arthroplasty  06/2008    "slipped on ice; torn ligaments & cartilage" (04/21/2012)  . Posterior lumbar fusion  04/21/2012  . Tonsillectomy and adenoidectomy  ~ 1950  . Abdominal hysterectomy  1980    "partial" (04/21/2012)  . Bladder suspension  1990  . Anterior fusion cervical spine  12/2005  . Tubal ligation  1970's  . Dilation and curettage of uterus  1980  . Appendectomy    . 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 Patellar tendon repair Left 12/10/2014    Procedure: REPAIR PARTIAL  QUAD TENDON TEAR;  Surgeon: Ollen Gross, MD;  [facility]: [facility];  Service: Orthopedics;  Laterality: Left;  . Knee closed reduction Left 03/25/2015    Procedure: CLOSED MANIPULATION LEFT KNEE;  Surgeon: Ollen Gross, MD;  [facility]: [facility];  Service: Orthopedics;  Laterality: Left;    There were no vitals filed for this visit.  Visit Diagnosis:  Weakness  Abnormality of gait  Swelling of limb  Stiffness of left knee  Left knee pain      Subjective Assessment - 05/29/15 0847    Subjective Patient using corticson cream to help with the healing of the skin on fragile side. No complains of pain, but stiffness in left knee.    Patient is accompained by: Family member   Limitations Sitting   How long can you sit comfortably? not able to bend knee with sitting   How long can you stand comfortably? 30 minutes   How long can you walk comfortably? wallking is now limited by endurance not by pain in left knee   Patient Stated Goals bend the knee, walk without crutch   Currently in Pain? No/denies            [facility] PT Assessment - 05/29/15 0001  Observation/Other Assessments   Focus on Therapeutic Outcomes (FOTO)  47% limitations                      [facility] Adult PT Treatment/Exercise - 05/29/15 0001    Exercises   Exercises Knee/Hip   Knee/Hip Exercises: Aerobic   Recumbent Bike L 1 x35min rocking to increase ROM   Knee/Hip Exercises: Machines for Strengthening   Total Gym Leg Press 100# bil 3x10, Lt only 65# 3x10  seat #7 increase weight to 120 to 130#   Knee/Hip Exercises: Standing   Lateral Step Up Left;2 sets;10 reps;Hand Hold: 2;Step Height: 6"   Forward Step Up Left;20 reps;Hand Hold: 2;Step Height: 8"   Rebounder weightshift in 3 directions x 1 min each   Vasopneumatic   Number Minutes Vasopneumatic  15 minutes   Vasopnuematic Location  Knee   Vasopneumatic Pressure Medium   Vasopneumatic Temperature  3 snowflakes   Manual Therapy   Manual Therapy  Soft tissue mobilization   Passive ROM into flexion as tolerated                  PT Short Term Goals - 05/29/15 1610    PT SHORT TERM GOAL #1   Title be independent in initial HEP   Time 3   Period Weeks   Status Achieved   PT SHORT TERM GOAL #2   Title demonstrate Lt knee AROM flexion > or = to 75 degrees   Time 3   Period Weeks   Status On-going           PT Long Term Goals - 05/29/15 9604    PT LONG TERM GOAL #1   Title be independent in advanced HEP   Time 8   Period Weeks   Status On-going   PT LONG TERM GOAL #2   Title improve FOTO to < or = to 48% limitaiton  as of Jan 18th 47%   Time 8   Period Weeks   Status Achieved   PT LONG TERM GOAL #3   Title demonstrate Lt knee AROM flexion to > or = to 95 degrees to improve sitting   Time 8   Period Weeks   Status On-going   PT LONG TERM GOAL #4   Title demonstrate 4+/5 Lt hip strength to improve gait   Time 8   Period Weeks   Status On-going   PT LONG TERM GOAL #5   Title wean from crutch and demonstrate Lt knee flexion with swing through phase of gait on level surface   Time 8   Period Weeks   Status On-going               Plan - 05/29/15 0901    Clinical Impression Statement Pt with increased flexion as observed on bike with rocking motion, able to increase weight on leg press with Lt LE to 65#. Pt will continue to improve with strength, ROM and gait with skilled PT.   Pt will benefit from skilled therapeutic intervention in order to improve on the following deficits Abnormal gait;Decreased activity tolerance;Difficulty walking;Increased edema;Impaired flexibility;Decreased strength;Hypomobility;Decreased range of motion;Decreased endurance;Decreased mobility;Decreased scar mobility;Increased muscle spasms;Pain;Increased fascial restricitons   Rehab Potential Good   Clinical Impairments Affecting Rehab Potential Lt knee manipulation TKA replacement in July 2016, Patellatendon rupture,  Hematoma, open wound,    PT Frequency 2x / week   PT Duration 8 weeks   PT Treatment/Interventions ADLs/Self Care Home Management;Cryotherapy;Electrical Stimulation;Gait training;Ultrasound;Moist  Heat;Stair training;Functional mobility training;Therapeutic activities;Therapeutic exercise;Neuromuscular re-education;Manual techniques;Patient/family education;Scar mobilization;Passive range of motion;Taping;Vasopneumatic Device   PT Next Visit Plan G-Code, Patient has MD appointment on Tuesday 24th of January. Lt knee AROM progression, edema management, Lt knee strength, gait training   Consulted and Agree with Plan of Care Patient        Problem List Patient Active Problem List   Diagnosis Date Noted  . Arthrofibrosis of total knee arthroplasty (HCC) 03/25/2015  . Quadriceps tendon rupture 12/09/2014  . OA (osteoarthritis) of knee 11/26/2014    NAUMANN-HOUEGNIFIO,Riannah Stagner PTA 05/29/2015, 9:46 AM  [facility] [address] [place], [place], [address] Phone: [phone]   Fax:  [phone]  Name: Lauren English MRN: [id number] Date of Birth: [birth year]

## 2015-05-31 ENCOUNTER — Encounter: Payer: Medicare Other | Admitting: Physical Therapy

## 2015-06-03 ENCOUNTER — Ambulatory Visit: Payer: Medicare Other | Admitting: Physical Therapy

## 2015-06-03 ENCOUNTER — Encounter: Payer: Self-pay | Admitting: Physical Therapy

## 2015-06-03 DIAGNOSIS — M25562 Pain in left knee: Secondary | ICD-10-CM | POA: Diagnosis not present

## 2015-06-03 DIAGNOSIS — R269 Unspecified abnormalities of gait and mobility: Secondary | ICD-10-CM

## 2015-06-03 DIAGNOSIS — M7989 Other specified soft tissue disorders: Secondary | ICD-10-CM

## 2015-06-03 DIAGNOSIS — R531 Weakness: Secondary | ICD-10-CM | POA: Diagnosis not present

## 2015-06-03 DIAGNOSIS — M25662 Stiffness of left knee, not elsewhere classified: Secondary | ICD-10-CM | POA: Diagnosis not present

## 2015-06-03 NOTE — Therapy (Signed)
[facility] [address], [place], [place], [address] Phone: [phone]   Fax:  [phone]  Physical Therapy Treatment  Patient Details  Name: Lauren English MRN: [id number] Date of Birth: 1940-11-28 Referring Provider: Gaynelle Arabian, MD  Encounter Date: 06/03/2015      PT End of Session - 06/03/15 1310    Visit Number 29   Number of Visits 39  Medicare   Date for PT Re-Evaluation 07/12/15   PT Start Time 8341   PT Stop Time 1325   PT Time Calculation (min) 50 min   Activity Tolerance Patient tolerated treatment well;Other (comment)  limited by increased redness of left knee   Behavior During Therapy [facility] for tasks assessed/performed      Past Medical History  Diagnosis Date  . PONV (postoperative nausea and vomiting)   . Bladder incontinence   . OSA on CPAP   . Arthritis     "qwhere; feet, knees, neck" (04/21/2012)  . Chronic back pain   . Closed angle glaucoma     "both eyes" (04/21/2012)  . Prediabetes   . Urinary tract bacterial infections   . Overactive bladder   . Numbness     fingertips bilat and feet bilat   . Peripheral neuropathy (HCC)     feet bilat   . Difficult intubation     with intubation on November 26, 2014  . Pneumonia     hx. of as child  . Anemia     Past Surgical History  Procedure Laterality Date  . Back surgery    . Knee arthroplasty  06/2008    "slipped on ice; torn ligaments & cartilage" (04/21/2012)  . Posterior lumbar fusion  04/21/2012  . Tonsillectomy and adenoidectomy  ~ 1950  . Abdominal hysterectomy  1980    "partial" (04/21/2012)  . Bladder suspension  1990  . Anterior fusion cervical spine  12/2005  . Tubal ligation  1970's  . Dilation and curettage of uterus  1980  . Appendectomy    . Total knee arthroplasty Left 11/26/2014    Procedure: TOTAL LEFT   KNEE ARTHROPLASTY;  Surgeon: Gaynelle Arabian, MD;  [facility]: [facility];  Service: Orthopedics;  Laterality: Left;   . Patellar tendon repair Left 12/10/2014    Procedure: REPAIR PARTIAL QUAD TENDON TEAR;  Surgeon: Gaynelle Arabian, MD;  Location: [facility];  Service: Orthopedics;  Laterality: Left;  . Knee closed reduction Left 03/25/2015    Procedure: CLOSED MANIPULATION LEFT KNEE;  Surgeon: Gaynelle Arabian, MD;  Location: [facility];  Service: Orthopedics;  Laterality: Left;    There were no vitals filed for this visit.  Visit Diagnosis:  Weakness  Abnormality of gait  Swelling of limb  Stiffness of left knee  Left knee pain      Subjective Assessment - 06/03/15 1238    Subjective My knee is feeling warmer and is redder.  I see the doctor tomorrow. I feel like it is swollen.    Limitations Sitting   How long can you sit comfortably? not able to bend knee with sitting   How long can you stand comfortably? 30 minutes   How long can you walk comfortably? wallking is now limited by endurance not by pain in left knee   Patient Stated Goals bend the knee, walk without crutch   Currently in Pain? Yes   Pain Score 2    Pain Location Leg   Pain Orientation Left   Pain Descriptors /  Indicators Tightness;Sore   Pain Type Surgical pain   Pain Onset More than a month ago   Pain Frequency Intermittent   Aggravating Factors  pain with bending the knee, walking   Pain Relieving Factors rest, ice, not bending   Multiple Pain Sites No            [facility] PT Assessment - 06/03/15 0001    Assessment   Medical Diagnosis S/P left total knee arthroplasty, wound debridement, Lt knee manipulation    Onset Date/Surgical Date 03/25/15   Next MD Visit 06/04/2015   Precautions   Precautions Fall   Balance Screen   Has the patient fallen in the past 6 months No   Has the patient had a decrease in activity level because of a fear of falling?  No   Is the patient reluctant to leave their home because of a fear of falling?  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 for tasks assessed   Observation/Other Assessments   Observations no pitting edema in left knee   Skin Integrity Left knee is shiny, increased redness, increased warmth   Focus on Therapeutic Outcomes (FOTO)  47% limitations    Observation/Other Assessments-Edema    Edema Circumferential   Circumferential Edema   Circumferential - Left  47.4cm   ROM / Strength   AROM / PROM / Strength AROM;PROM;Strength   AROM   AROM Assessment Site Knee   Right/Left Knee Left   PROM   Right/Left Knee Left   Left Knee Extension 0   Left Knee Flexion 65                     [facility] Adult PT Treatment/Exercise - 06/03/15 0001    Knee/Hip Exercises: Aerobic   Recumbent Bike L 1 x44mn rocking to increase ROM   Vasopneumatic   Number Minutes Vasopneumatic  15 minutes   Vasopnuematic Location  Knee   Vasopneumatic Pressure Medium   Vasopneumatic Temperature  3 snowflakes   Manual Therapy   Manual Therapy Joint mobilization;Soft tissue mobilization;Passive ROM   Edema Management retrograde massage for edema   Joint Mobilization anterior glide and distraction of left knee with PROM to increase felxion   Soft tissue mobilization left knee, quads, gastroc   Passive ROM flexion and extension of left knee                PT Education - 06/03/15 1310    Education provided No          PT Short Term Goals - 06/03/15 1311    PT SHORT TERM GOAL #1   Title be independent in initial HEP   Time 3   Period Weeks   Status Achieved   PT SHORT TERM GOAL #2   Title demonstrate Lt knee AROM flexion > or = to 75 degrees   Time 3   Period Weeks   Status On-going  65 degrees           PT Long Term Goals - 06/03/15 1312    PT LONG TERM GOAL #1   Title be independent in advanced HEP   Time 8   Period Weeks   Status On-going  still learning   PT LONG TERM GOAL  #2   Title improve FOTO to < or = to 48% limitaiton  Time 8   Period Weeks   Status Achieved   PT LONG TERM GOAL #3   Title demonstrate Lt knee AROM flexion to > or = to 95 degrees to improve sitting   Time 8   Period Weeks   Status On-going  65 degrees flexion PROM   PT LONG TERM GOAL #4   Title demonstrate 4+/5 Lt hip strength to improve gait   Time 8   Period Weeks   Status On-going   PT LONG TERM GOAL #5   Title wean from crutch and demonstrate Lt knee flexion with swing through phase of gait on level surface   Time 8   Period Weeks   Status On-going               Plan - 2015-06-12 1315    Clinical Impression Statement Patient is having incresaed redness and shinniness of left knee.  Redness was on the anterior lower lateral left knee.  Left knee PROM is 0-65 degrees.  Circumferential measurement of left knee is 47.4 cm.  No pitting edema.  Patient has not met the ROM goals due to  limited ROM.  Patient ambulates with decreased left knee flexion and extension. Patient will continue to benefit from physical therapy to increase ROM and strength of left knee so she can return to prior functional level.    Clinical Impairments Affecting Rehab Potential Lt knee manipulation TKA replacement in July 2016, Patellatendon rupture, Hematoma, open wound,    PT Frequency 2x / week   PT Duration 8 weeks   PT Treatment/Interventions ADLs/Self Care Home Management;Cryotherapy;Electrical Stimulation;Gait training;Ultrasound;Moist Heat;Stair training;Functional mobility training;Therapeutic activities;Therapeutic exercise;Neuromuscular re-education;Manual techniques;Patient/family education;Scar mobilization;Passive range of motion;Taping;Vasopneumatic Device   PT Next Visit Plan See what MD says.  work on left knee ROM and strength   PT Home Exercise Plan update HE{   Recommended Other Services None   Consulted and Agree with Plan of Care Patient          G-Codes - 06-12-15 1313     Functional Assessment Tool Used FOTO score is 47% limitation but patient is having increased redness and shinniness with decrease left knee mobility   Functional Limitation Mobility: Walking and moving around   Mobility: Walking and Moving Around Current Status (N8676) At least 40 percent but less than 60 percent impaired, limited or restricted   Mobility: Walking and Moving Around Goal Status [phone]) At least 40 percent but less than 60 percent impaired, limited or restricted      Problem List Patient Active Problem List   Diagnosis Date Noted  . Arthrofibrosis of total knee arthroplasty ([place]) 03/25/2015  . Quadriceps tendon rupture 12/09/2014  . OA (osteoarthritis) of knee 11/26/2014    Earlie Counts, PT 2015/06/12 1:19 PM    [facility] [address], [place], [place], [address] Phone: [phone]   Fax:  [phone]  Name: Lauren English MRN: [id number] Date of Birth: [birth year]   Physical Therapy Progress Note  Dates of Reporting Period: 05/01/2015 to 06/12/2015  Objective Reports of Subjective Statement: Patient can get into and out of the car with greater ease and does not have to open the door all the way to get out.  Patient does not have to put her seat all the way back to get into the car.  Patient is walking without the cane so she can go up and down the stairs and carry a clothes basket.   Objective Measurements:Left knee  PROM is 0-65 degrees .  Left knee  Circumferential measurement of left knee is 47.4 cm.  Patient left knee is shinny with increased warmth.  Goal Update: Patient has not met her ROM and strength goal due to the limited mobility of left knee.  Patient has come into therapy today with increased redness and warmth of left knee limiting her progress.   Plan: Continue physical therapy to work on ROM, strength, and gait.  See what MD says with knee becoming redder and warmer.   Reason Skilled  Services are Required: Monitor patient due to past history of infection while improving left knee ROM and strength with skilled PT.  Earlie Counts, PT 06/03/2015 1:29 PM

## 2015-06-04 DIAGNOSIS — Z96652 Presence of left artificial knee joint: Secondary | ICD-10-CM | POA: Diagnosis not present

## 2015-06-04 DIAGNOSIS — Z471 Aftercare following joint replacement surgery: Secondary | ICD-10-CM | POA: Diagnosis not present

## 2015-06-05 ENCOUNTER — Encounter: Payer: Self-pay | Admitting: Physical Therapy

## 2015-06-05 ENCOUNTER — Ambulatory Visit: Payer: Medicare Other | Admitting: Physical Therapy

## 2015-06-05 DIAGNOSIS — M25562 Pain in left knee: Secondary | ICD-10-CM | POA: Diagnosis not present

## 2015-06-05 DIAGNOSIS — M25662 Stiffness of left knee, not elsewhere classified: Secondary | ICD-10-CM | POA: Diagnosis not present

## 2015-06-05 DIAGNOSIS — R269 Unspecified abnormalities of gait and mobility: Secondary | ICD-10-CM | POA: Diagnosis not present

## 2015-06-05 DIAGNOSIS — R531 Weakness: Secondary | ICD-10-CM | POA: Diagnosis not present

## 2015-06-05 DIAGNOSIS — M7989 Other specified soft tissue disorders: Secondary | ICD-10-CM | POA: Diagnosis not present

## 2015-06-05 NOTE — Therapy (Signed)
[facility] [address] [place], [place], [address] Phone: [phone]   Fax:  [phone]  Physical Therapy Treatment  Patient Details  Name: Lauren English MRN: [id number] Date of Birth: December 11, 1940 Referring Provider: Ollen Gross, MD  Encounter Date: 06/05/2015      PT End of Session - 06/05/15 0859    Visit Number 30   Number of Visits 39   Date for PT Re-Evaluation 07/12/15   PT Start Time 0849   PT Stop Time 0948   PT Time Calculation (min) 59 min   Activity Tolerance Patient tolerated treatment well;Other (comment)   Behavior During Therapy WFL for tasks assessed/performed      Past Medical History  Diagnosis Date  . PONV (postoperative nausea and vomiting)   . Bladder incontinence   . OSA on CPAP   . Arthritis     "qwhere; feet, knees, neck" (04/21/2012)  . Chronic back pain   . Closed angle glaucoma     "both eyes" (04/21/2012)  . Prediabetes   . Urinary tract bacterial infections   . Overactive bladder   . Numbness     fingertips bilat and feet bilat   . Peripheral neuropathy (HCC)     feet bilat   . Difficult intubation     with intubation on November 26, 2014  . Pneumonia     hx. of as child  . Anemia     Past Surgical History  Procedure Laterality Date  . Back surgery    . Knee arthroplasty  06/2008    "slipped on ice; torn ligaments & cartilage" (04/21/2012)  . Posterior lumbar fusion  04/21/2012  . Tonsillectomy and adenoidectomy  ~ 1950  . Abdominal hysterectomy  1980    "partial" (04/21/2012)  . Bladder suspension  1990  . Anterior fusion cervical spine  12/2005  . Tubal ligation  1970's  . Dilation and curettage of uterus  1980  . Appendectomy    . 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 Patellar tendon repair Left 12/10/2014   Procedure: REPAIR PARTIAL QUAD TENDON TEAR;  Surgeon: Ollen Gross, MD;  Location: [facility];  Service: Orthopedics;  Laterality: Left;  . Knee closed reduction Left 03/25/2015    Procedure: CLOSED MANIPULATION LEFT KNEE;  Surgeon: Ollen Gross, MD;  Location: [facility];  Service: Orthopedics;  Laterality: Left;    There were no vitals filed for this visit.  Visit Diagnosis:  Weakness  Abnormality of gait  Swelling of limb  Stiffness of left knee  Left knee pain      Subjective Assessment - 06/05/15 0857    Subjective Knee looks and feel better than last PT visit. MD is pleased with progression of left knee.   Patient is accompained by: Family member   Limitations Sitting   How long can you sit comfortably? not able to bend knee with sitting   How long can you stand comfortably? 30 minutes   How long can you walk comfortably? wallking is now limited by endurance not by pain in left knee   Patient Stated Goals bend the knee, walk without crutch   Currently in Pain? No/denies                         [facility] Adult PT Treatment/Exercise -  06/05/15 0001    Exercises   Exercises Knee/Hip   Knee/Hip Exercises: Stretches   Quad Stretch Left;3 reps;30 seconds  on stairs   Knee/Hip Exercises: Aerobic   Recumbent Bike L 1 x71min rocking to increase ROM   Knee/Hip Exercises: Machines for Strengthening   Total Gym Leg Press bil 110# 1x10, 120# 2x10, Lt only 65# 3x10  pt is challenged with incr of weight, but able to perform   Knee/Hip Exercises: Standing   Rebounder weightshift in 3 directions x 1 min each   Other Standing Knee Exercises squatting in available range with B UE holding on sink, chair behind 2 x 10   Vasopneumatic   Number Minutes Vasopneumatic  15 minutes   Vasopnuematic Location  Knee   Vasopneumatic Pressure Medium   Vasopneumatic Temperature  3 snowflakes   Manual Therapy   Manual Therapy Joint mobilization;Soft tissue mobilization;Passive ROM    Edema Management retrograde massage for edema   Joint Mobilization anterior glide and distraction of left knee with PROM to increase felxion   Soft tissue mobilization left knee, quads, gastroc   Passive ROM flexion and extension of left knee                  PT Short Term Goals - 06/03/15 1311    PT SHORT TERM GOAL #1   Title be independent in initial HEP   Time 3   Period Weeks   Status Achieved   PT SHORT TERM GOAL #2   Title demonstrate Lt knee AROM flexion > or = to 75 degrees   Time 3   Period Weeks   Status On-going  65 degrees           PT Long Term Goals - 06/03/15 1312    PT LONG TERM GOAL #1   Title be independent in advanced HEP   Time 8   Period Weeks   Status On-going  still learning   PT LONG TERM GOAL #2   Title improve FOTO to < or = to 48% limitaiton   Time 8   Period Weeks   Status Achieved   PT LONG TERM GOAL #3   Title demonstrate Lt knee AROM flexion to > or = to 95 degrees to improve sitting   Time 8   Period Weeks   Status On-going  65 degrees flexion PROM   PT LONG TERM GOAL #4   Title demonstrate 4+/5 Lt hip strength to improve gait   Time 8   Period Weeks   Status On-going   PT LONG TERM GOAL #5   Title wean from crutch and demonstrate Lt knee flexion with swing through phase of gait on level surface   Time 8   Period Weeks   Status On-going               Plan - 06/05/15 0900    Clinical Impression Statement Patient with decreased redness and less shinniness of left knee and feeling warm, but MD not concerned. Patient will continue to benefit from skilled PT to increase ROM, strength and endurance    Pt will benefit from skilled therapeutic intervention in order to improve on the following deficits Abnormal gait;Decreased activity tolerance;Difficulty walking;Increased edema;Impaired flexibility;Decreased strength;Hypomobility;Decreased range of motion;Decreased endurance;Decreased mobility;Decreased scar  mobility;Increased muscle spasms;Pain;Increased fascial restricitons   Rehab Potential Good   Clinical Impairments Affecting Rehab Potential Lt knee manipulation TKA replacement in July 2016, Patellatendon rupture, Hematoma, open wound,    PT Frequency 2x /  week   PT Duration 8 weeks   PT Treatment/Interventions ADLs/Self Care Home Management;Cryotherapy;Electrical Stimulation;Gait training;Ultrasound;Moist Heat;Stair training;Functional mobility training;Therapeutic activities;Therapeutic exercise;Neuromuscular re-education;Manual techniques;Patient/family education;Scar mobilization;Passive range of motion;Taping;Vasopneumatic Device   PT Next Visit Plan Continue with stretching and strenthening to patient's tolerance   PT Home Exercise Plan continue with current HEP   Consulted and Agree with Plan of Care Patient        Problem List Patient Active Problem List   Diagnosis Date Noted  . Arthrofibrosis of total knee arthroplasty (HCC) 03/25/2015  . Quadriceps tendon rupture 12/09/2014  . OA (osteoarthritis) of knee 11/26/2014    NAUMANN-HOUEGNIFIO,Xane Amsden PTA 06/05/2015, 9:36 AM  [facility] [address], [address] [place], [place], [address] Phone: [phone]   Fax:  [phone]  Name: JUNIOR KENEDY MRN: [id number] Date of Birth: May 26, 1940

## 2015-06-07 ENCOUNTER — Encounter: Payer: Medicare Other | Admitting: Physical Therapy

## 2015-06-10 ENCOUNTER — Ambulatory Visit: Payer: Medicare Other | Admitting: Physical Therapy

## 2015-06-10 ENCOUNTER — Encounter: Payer: Self-pay | Admitting: Physical Therapy

## 2015-06-10 DIAGNOSIS — R531 Weakness: Secondary | ICD-10-CM

## 2015-06-10 DIAGNOSIS — R269 Unspecified abnormalities of gait and mobility: Secondary | ICD-10-CM

## 2015-06-10 DIAGNOSIS — M7989 Other specified soft tissue disorders: Secondary | ICD-10-CM | POA: Diagnosis not present

## 2015-06-10 DIAGNOSIS — M25562 Pain in left knee: Secondary | ICD-10-CM | POA: Diagnosis not present

## 2015-06-10 DIAGNOSIS — M25662 Stiffness of left knee, not elsewhere classified: Secondary | ICD-10-CM | POA: Diagnosis not present

## 2015-06-10 NOTE — Therapy (Signed)
[facility] [address] [place], [place], [address] Phone: [phone]   Fax:  [phone]  Physical Therapy Treatment  Patient Details  Name: Lauren English MRN: [id number] Date of Birth: 11/08/1940 Referring Provider: Ollen Gross, MD  Encounter Date: 06/10/2015      PT End of Session - 06/10/15 1554    Visit Number 31   Number of Visits 39   Date for PT Re-Evaluation 07/12/15   PT Start Time 1532   PT Stop Time 1630   PT Time Calculation (min) 58 min   Activity Tolerance Patient tolerated treatment well;Other (comment)   Behavior During Therapy WFL for tasks assessed/performed      Past Medical History  Diagnosis Date  . PONV (postoperative nausea and vomiting)   . Bladder incontinence   . OSA on CPAP   . Arthritis     "qwhere; feet, knees, neck" (04/21/2012)  . Chronic back pain   . Closed angle glaucoma     "both eyes" (04/21/2012)  . Prediabetes   . Urinary tract bacterial infections   . Overactive bladder   . Numbness     fingertips bilat and feet bilat   . Peripheral neuropathy (HCC)     feet bilat   . Difficult intubation     with intubation on November 26, 2014  . Pneumonia     hx. of as child  . Anemia     Past Surgical History  Procedure Laterality Date  . Back surgery    . Knee arthroplasty  06/2008    "slipped on ice; torn ligaments & cartilage" (04/21/2012)  . Posterior lumbar fusion  04/21/2012  . Tonsillectomy and adenoidectomy  ~ 1950  . Abdominal hysterectomy  1980    "partial" (04/21/2012)  . Bladder suspension  1990  . Anterior fusion cervical spine  12/2005  . Tubal ligation  1970's  . Dilation and curettage of uterus  1980  . Appendectomy    . 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 Patellar tendon repair Left 12/10/2014     Procedure: REPAIR PARTIAL QUAD TENDON TEAR;  Surgeon: Ollen Gross, MD;  Location: [facility];  Service: Orthopedics;  Laterality: Left;  . Knee closed reduction Left 03/25/2015    Procedure: CLOSED MANIPULATION LEFT KNEE;  Surgeon: Ollen Gross, MD;  Location: [facility];  Service: Orthopedics;  Laterality: Left;    There were no vitals filed for this visit.  Visit Diagnosis:  Weakness  Abnormality of gait  Swelling of limb  Stiffness of left knee  Left knee pain      Subjective Assessment - 06/10/15 1545    Subjective Left knee is "puffing up" and starts to drain on left lat distal patellar side. PTA covered it with Gauze in clinic. Patient is advised by PTA to contact MD to address this issue.    Patient is accompained by: Family member   Limitations Sitting   How long can you sit comfortably? not able to bend knee with sitting   How long can you stand comfortably? 30 minutes   How long can you walk comfortably? wallking is now limited by endurance not by pain in left knee   Patient Stated Goals bend the knee, walk without crutch   Currently in Pain? No/denies  [facility] Adult PT Treatment/Exercise - 06/10/15 0001    Exercises   Exercises Knee/Hip   Knee/Hip Exercises: Stretches   Quad Stretch Left;3 reps;30 seconds  on stairs    Knee/Hip Exercises: Aerobic   Recumbent Bike L 1 x74min rocking to increase ROM   Knee/Hip Exercises: Machines for Strengthening   Total Gym Leg Press bil 110# 1x10 3x10, Lt only 60# 3x10  decreased weight due to swelling, limited with bending   Knee/Hip Exercises: Standing   Rebounder weightshift in 3 directions x 1 min each   Other Standing Knee Exercises squatting in available range with B UE holding on sink, chair behind 2 x 10   Vasopneumatic   Number Minutes Vasopneumatic  15 minutes   Vasopnuematic Location  Knee   Vasopneumatic Pressure Medium   Vasopneumatic Temperature  3 snowflakes   Manual Therapy    Passive ROM --  not performed today due to drainage                  PT Short Term Goals - 06/03/15 1311    PT SHORT TERM GOAL #1   Title be independent in initial HEP   Time 3   Period Weeks   Status Achieved   PT SHORT TERM GOAL #2   Title demonstrate Lt knee AROM flexion > or = to 75 degrees   Time 3   Period Weeks   Status On-going  65 degrees           PT Long Term Goals - 06/03/15 1312    PT LONG TERM GOAL #1   Title be independent in advanced HEP   Time 8   Period Weeks   Status On-going  still learning   PT LONG TERM GOAL #2   Title improve FOTO to < or = to 48% limitaiton   Time 8   Period Weeks   Status Achieved   PT LONG TERM GOAL #3   Title demonstrate Lt knee AROM flexion to > or = to 95 degrees to improve sitting   Time 8   Period Weeks   Status On-going  65 degrees flexion PROM   PT LONG TERM GOAL #4   Title demonstrate 4+/5 Lt hip strength to improve gait   Time 8   Period Weeks   Status On-going   PT LONG TERM GOAL #5   Title wean from crutch and demonstrate Lt knee flexion with swing through phase of gait on level surface   Time 8   Period Weeks   Status On-going               Problem List Patient Active Problem List   Diagnosis Date Noted  . Arthrofibrosis of total knee arthroplasty (HCC) 03/25/2015  . Quadriceps tendon rupture 12/09/2014  . OA (osteoarthritis) of knee 11/26/2014    NAUMANN-HOUEGNIFIO,Axl Rodino PTA 06/10/2015, 4:20 PM  [facility] [address] [place], [place], [address] Phone: [phone]   Fax:  [phone]  Name: Lauren English MRN: [id number] Date of Birth: Mar 29, 1941

## 2015-06-11 DIAGNOSIS — Z471 Aftercare following joint replacement surgery: Secondary | ICD-10-CM | POA: Diagnosis not present

## 2015-06-11 DIAGNOSIS — Z96652 Presence of left artificial knee joint: Secondary | ICD-10-CM | POA: Diagnosis not present

## 2015-06-11 DIAGNOSIS — M4316 Spondylolisthesis, lumbar region: Secondary | ICD-10-CM | POA: Diagnosis not present

## 2015-06-12 ENCOUNTER — Encounter: Payer: Self-pay | Admitting: Physical Therapy

## 2015-06-12 ENCOUNTER — Ambulatory Visit: Payer: Medicare Other | Attending: Orthopedic Surgery | Admitting: Physical Therapy

## 2015-06-12 DIAGNOSIS — M7989 Other specified soft tissue disorders: Secondary | ICD-10-CM | POA: Insufficient documentation

## 2015-06-12 DIAGNOSIS — M25562 Pain in left knee: Secondary | ICD-10-CM | POA: Diagnosis not present

## 2015-06-12 DIAGNOSIS — M25662 Stiffness of left knee, not elsewhere classified: Secondary | ICD-10-CM | POA: Diagnosis not present

## 2015-06-12 DIAGNOSIS — R269 Unspecified abnormalities of gait and mobility: Secondary | ICD-10-CM | POA: Insufficient documentation

## 2015-06-12 DIAGNOSIS — R531 Weakness: Secondary | ICD-10-CM | POA: Diagnosis not present

## 2015-06-12 NOTE — Therapy (Signed)
[facility] [address] [place], [place], [address] Phone: [phone]   Fax:  [phone]  Physical Therapy Treatment  Patient Details  Name: Lauren English MRN: [id number] Date of Birth: 1941/01/06 Referring Provider: Ollen Gross, MD  Encounter Date: 06/12/2015      PT End of Session - 06/12/15 0859    Visit Number 32   Number of Visits 39   Date for PT Re-Evaluation 07/12/15   PT Start Time 0848   PT Stop Time 0949   PT Time Calculation (min) 61 min   Activity Tolerance Patient tolerated treatment well;Other (comment)   Behavior During Therapy WFL for tasks assessed/performed      Past Medical History  Diagnosis Date  . PONV (postoperative nausea and vomiting)   . Bladder incontinence   . OSA on CPAP   . Arthritis     "qwhere; feet, knees, neck" (04/21/2012)  . Chronic back pain   . Closed angle glaucoma     "both eyes" (04/21/2012)  . Prediabetes   . Urinary tract bacterial infections   . Overactive bladder   . Numbness     fingertips bilat and feet bilat   . Peripheral neuropathy (HCC)     feet bilat   . Difficult intubation     with intubation on November 26, 2014  . Pneumonia     hx. of as child  . Anemia     Past Surgical History  Procedure Laterality Date  . Back surgery    . Knee arthroplasty  06/2008    "slipped on ice; torn ligaments & cartilage" (04/21/2012)  . Posterior lumbar fusion  04/21/2012  . Tonsillectomy and adenoidectomy  ~ 1950  . Abdominal hysterectomy  1980    "partial" (04/21/2012)  . Bladder suspension  1990  . Anterior fusion cervical spine  12/2005  . Tubal ligation  1970's  . Dilation and curettage of uterus  1980  . Appendectomy    . 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 Patellar tendon repair Left 12/10/2014    Procedure:  REPAIR PARTIAL QUAD TENDON TEAR;  Surgeon: Ollen Gross, MD;  Location: [facility];  Service: Orthopedics;  Laterality: Left;  . Knee closed reduction Left 03/25/2015    Procedure: CLOSED MANIPULATION LEFT KNEE;  Surgeon: Ollen Gross, MD;  Location: [facility];  Service: Orthopedics;  Laterality: Left;    There were no vitals filed for this visit.  Visit Diagnosis:  Weakness  Abnormality of gait  Swelling of limb  Stiffness of left knee  Left knee pain      Subjective Assessment - 06/12/15 0856    Subjective Pt saw MD yesterday, she is back on antiobiotica due to drainage left knee. Pt has her knee ace wrapped, she denies pain.   Limitations Sitting   How long can you sit comfortably? not able to bend knee with sitting   How long can you stand comfortably? 30 minutes   How long can you walk comfortably? wallking is now limited by endurance not by pain in left knee   Patient Stated Goals bend the knee, walk without crutch   Currently in Pain? No/denies                         [facility] Adult PT Treatment/Exercise -  06/12/15 0001    Exercises   Exercises Knee/Hip   Knee/Hip Exercises: Stretches   Quad Stretch Left;3 reps;30 seconds  on stairs   Knee/Hip Exercises: Aerobic   Recumbent Bike L 1 x25min rocking to increase ROM   Knee/Hip Exercises: Machines for Strengthening   Total Gym Leg Press Seat#7 110# 3x10, Lt only 60# 3x10   Knee/Hip Exercises: Standing   Rebounder weightshift in 3 directions x 1 min each   Other Standing Knee Exercises squatting in available range with B UE holding on sink, chair behind 2 x 10   Vasopneumatic   Number Minutes Vasopneumatic  15 minutes   Vasopnuematic Location  Knee   Vasopneumatic Pressure Medium   Vasopneumatic Temperature  3 snowflakes   Manual Therapy   Passive ROM flexion and extension of left knee                  PT Short Term Goals - 06/03/15 1311    PT SHORT TERM GOAL #1   Title be independent in  initial HEP   Time 3   Period Weeks   Status Achieved   PT SHORT TERM GOAL #2   Title demonstrate Lt knee AROM flexion > or = to 75 degrees   Time 3   Period Weeks   Status On-going  65 degrees           PT Long Term Goals - 06/03/15 1312    PT LONG TERM GOAL #1   Title be independent in advanced HEP   Time 8   Period Weeks   Status On-going  still learning   PT LONG TERM GOAL #2   Title improve FOTO to < or = to 48% limitaiton   Time 8   Period Weeks   Status Achieved   PT LONG TERM GOAL #3   Title demonstrate Lt knee AROM flexion to > or = to 95 degrees to improve sitting   Time 8   Period Weeks   Status On-going  65 degrees flexion PROM   PT LONG TERM GOAL #4   Title demonstrate 4+/5 Lt hip strength to improve gait   Time 8   Period Weeks   Status On-going   PT LONG TERM GOAL #5   Title wean from crutch and demonstrate Lt knee flexion with swing through phase of gait on level surface   Time 8   Period Weeks   Status On-going               Plan - 06/12/15 0902    Clinical Impression Statement Patient able to perform her exercises, despite wearing a ace wrap. Progress with PT is delayed due to inflammation in left knee patient is backon Antibiotica.    Rehab Potential Good   Clinical Impairments Affecting Rehab Potential Lt knee manipulation TKA replacement in July 2016, Patellatendon rupture, Hematoma, open wound,    PT Frequency 2x / week   PT Duration 8 weeks   PT Treatment/Interventions ADLs/Self Care Home Management;Cryotherapy;Electrical Stimulation;Gait training;Ultrasound;Moist Heat;Stair training;Functional mobility training;Therapeutic activities;Therapeutic exercise;Neuromuscular re-education;Manual techniques;Patient/family education;Scar mobilization;Passive range of motion;Taping;Vasopneumatic Device   PT Next Visit Plan Continue with stretching and strenthening to patient's tolerance   PT Home Exercise Plan continue with current HEP    Consulted and Agree with Plan of Care Patient        Problem List Patient Active Problem List   Diagnosis Date Noted  . Arthrofibrosis of total knee arthroplasty (HCC) 03/25/2015  . Quadriceps tendon  rupture 12/09/2014  . OA (osteoarthritis) of knee 11/26/2014    NAUMANN-HOUEGNIFIO,Luvada Salamone PTA 06/12/2015, 11:53 AM  [facility] [address] [place], [place], [address] Phone: [phone]   Fax:  [phone]  Name: Lauren English MRN: [id number] Date of Birth: [birth year]

## 2015-06-17 ENCOUNTER — Ambulatory Visit: Payer: Medicare Other | Admitting: Physical Therapy

## 2015-06-17 ENCOUNTER — Encounter: Payer: Self-pay | Admitting: Physical Therapy

## 2015-06-17 DIAGNOSIS — M25562 Pain in left knee: Secondary | ICD-10-CM | POA: Diagnosis not present

## 2015-06-17 DIAGNOSIS — M25662 Stiffness of left knee, not elsewhere classified: Secondary | ICD-10-CM

## 2015-06-17 DIAGNOSIS — R531 Weakness: Secondary | ICD-10-CM | POA: Diagnosis not present

## 2015-06-17 DIAGNOSIS — M7989 Other specified soft tissue disorders: Secondary | ICD-10-CM | POA: Diagnosis not present

## 2015-06-17 DIAGNOSIS — R269 Unspecified abnormalities of gait and mobility: Secondary | ICD-10-CM | POA: Diagnosis not present

## 2015-06-17 NOTE — Therapy (Signed)
[facility] [address] [place], [place], [address] Phone: [phone]   Fax:  [phone]  Physical Therapy Treatment  Patient Details  Name: Lauren English MRN: [id number] Date of Birth: 09/20/1940 Referring Provider: Ollen Gross, MD  Encounter Date: 06/17/2015      PT End of Session - 06/17/15 0901    Visit Number 33   Number of Visits 39   Date for PT Re-Evaluation 07/12/15   PT Start Time 0835   PT Stop Time 0947   PT Time Calculation (min) 72 min   Activity Tolerance Patient tolerated treatment well;Other (comment)   Behavior During Therapy WFL for tasks assessed/performed      Past Medical History  Diagnosis Date  . PONV (postoperative nausea and vomiting)   . Bladder incontinence   . OSA on CPAP   . Arthritis     "qwhere; feet, knees, neck" (04/21/2012)  . Chronic back pain   . Closed angle glaucoma     "both eyes" (04/21/2012)  . Prediabetes   . Urinary tract bacterial infections   . Overactive bladder   . Numbness     fingertips bilat and feet bilat   . Peripheral neuropathy (HCC)     feet bilat   . Difficult intubation     with intubation on November 26, 2014  . Pneumonia     hx. of as child  . Anemia     Past Surgical History  Procedure Laterality Date  . Back surgery    . Knee arthroplasty  06/2008    "slipped on ice; torn ligaments & cartilage" (04/21/2012)  . Posterior lumbar fusion  04/21/2012  . Tonsillectomy and adenoidectomy  ~ 1950  . Abdominal hysterectomy  1980    "partial" (04/21/2012)  . Bladder suspension  1990  . Anterior fusion cervical spine  12/2005  . Tubal ligation  1970's  . Dilation and curettage of uterus  1980  . Appendectomy    . 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 Patellar tendon repair Left 12/10/2014    Procedure:  REPAIR PARTIAL QUAD TENDON TEAR;  Surgeon: Ollen Gross, MD;  Location: [facility];  Service: Orthopedics;  Laterality: Left;  . Knee closed reduction Left 03/25/2015    Procedure: CLOSED MANIPULATION LEFT KNEE;  Surgeon: Ollen Gross, MD;  Location: [facility];  Service: Orthopedics;  Laterality: Left;    There were no vitals filed for this visit.  Visit Diagnosis:  Weakness  Abnormality of gait  Swelling of limb  Stiffness of left knee  Left knee pain      Subjective Assessment - 06/17/15 0852    Subjective Pt has MD appointment tomorrow Tuesday Feb 6th. Pt has bandage on left knee, she reports still drainbage, pt is taking Antibiotika to address this. Pt reports no pain in left knee , but reports Right posterior knee pain rated as 3/10       Patient is accompained by: Family member   Limitations Sitting   How long can you sit comfortably? not able to bend knee with sitting   How long can you stand comfortably? 30 minutes   How long can you walk comfortably? wallking is now limited by endurance not by pain in left knee   Patient Stated Goals bend the knee, walk without crutch   Currently in  Pain? No/denies                         [facility] Adult PT Treatment/Exercise - 06/17/15 0001    Exercises   Exercises Knee/Hip   Knee/Hip Exercises: Stretches   Quad Stretch Left;3 reps;30 seconds  on stairs   Knee/Hip Exercises: Aerobic   Recumbent Bike L 1 x41min rocking to increase ROM   Knee/Hip Exercises: Machines for Strengthening   Total Gym Leg Press Seat#7 110# 3x10, Lt only 60# 3x10   Knee/Hip Exercises: Standing   Rebounder weightshift in 3 directions x 1 min each   Other Standing Knee Exercises squatting in available range with B UE holding on sink, chair behind 2 x 10   Other Standing Knee Exercises --  sit to stand x 4, difficult due to right knee pain today   Vasopneumatic   Number Minutes Vasopneumatic  15 minutes   Vasopnuematic Location  Knee    Vasopneumatic Pressure Medium   Vasopneumatic Temperature  3 snowflakes                  PT Short Term Goals - 06/17/15 0905    PT SHORT TERM GOAL #1   Title be independent in initial HEP   Time 3   Period Weeks   Status Achieved   PT SHORT TERM GOAL #2   Title demonstrate Lt knee AROM flexion > or = to 75 degrees   Time 3   Period Weeks   Status On-going           PT Long Term Goals - 06/17/15 0911    PT LONG TERM GOAL #1   Title be independent in advanced HEP   Time 8   Period Weeks   Status On-going   PT LONG TERM GOAL #2   Title improve FOTO to < or = to 48% limitaiton   Time 8   Period Weeks   Status Achieved   PT LONG TERM GOAL #3   Title demonstrate Lt knee AROM flexion to > or = to 95 degrees to improve sitting   Time 8   Period Weeks   Status On-going   PT LONG TERM GOAL #4   Title demonstrate 4+/5 Lt hip strength to improve gait   Time 8   Period Weeks   Status On-going   PT LONG TERM GOAL #5   Title wean from crutch and demonstrate Lt knee flexion with swing through phase of gait on level surface   Time 8   Period Weeks   Status On-going               Plan - 06/17/15 0902    Clinical Impression Statement Patient left knee with improved tolerance with bending with activities. AROM in supine, left knee in degrees:  flexion 56, extension -5. Swelling and drainage in left knee. Patiens progess is delayed due to inflammation in left knee, she is on antibiotica.     Pt will benefit from skilled therapeutic intervention in order to improve on the following deficits Abnormal gait;Decreased activity tolerance;Difficulty walking;Increased edema;Impaired flexibility;Decreased strength;Hypomobility;Decreased range of motion;Decreased endurance;Decreased mobility;Decreased scar mobility;Increased muscle spasms;Pain;Increased fascial restricitons   Rehab Potential Good   Clinical Impairments Affecting Rehab Potential Lt knee manipulation TKA  replacement in July 2016, Patellatendon rupture, Hematoma, open wound,    PT Frequency 2x / week   PT Duration 8 weeks   PT Next Visit Plan Continue with stretching and  strenthening to patient's tolerance   PT Home Exercise Plan continue with current HEP   Consulted and Agree with Plan of Care Patient        Problem List Patient Active Problem List   Diagnosis Date Noted  . Arthrofibrosis of total knee arthroplasty (HCC) 03/25/2015  . Quadriceps tendon rupture 12/09/2014  . OA (osteoarthritis) of knee 11/26/2014    NAUMANN-HOUEGNIFIO,Krislynn Gronau PTA 06/17/2015, 9:23 AM  [facility] [address] [place], [place], [address] Phone: [phone]   Fax:  [phone]  Name: Lauren English MRN: [id number] Date of Birth: Apr 22, 1941

## 2015-06-18 ENCOUNTER — Ambulatory Visit
Admission: RE | Admit: 2015-06-18 | Discharge: 2015-06-18 | Disposition: A | Discharge status: Home / Self Care | Payer: Medicare Other | Source: Ambulatory Visit

## 2015-06-18 DIAGNOSIS — Z1231 Encounter for screening mammogram for malignant neoplasm of breast: Secondary | ICD-10-CM | POA: Diagnosis not present

## 2015-06-18 DIAGNOSIS — Z96652 Presence of left artificial knee joint: Secondary | ICD-10-CM | POA: Diagnosis not present

## 2015-06-18 DIAGNOSIS — Z471 Aftercare following joint replacement surgery: Secondary | ICD-10-CM | POA: Diagnosis not present

## 2015-06-18 DIAGNOSIS — M1711 Unilateral primary osteoarthritis, right knee: Secondary | ICD-10-CM | POA: Diagnosis not present

## 2015-06-19 ENCOUNTER — Encounter: Payer: Self-pay | Admitting: Physical Therapy

## 2015-06-19 ENCOUNTER — Ambulatory Visit: Payer: Medicare Other | Admitting: Physical Therapy

## 2015-06-19 DIAGNOSIS — M25662 Stiffness of left knee, not elsewhere classified: Secondary | ICD-10-CM

## 2015-06-19 DIAGNOSIS — M7989 Other specified soft tissue disorders: Secondary | ICD-10-CM | POA: Diagnosis not present

## 2015-06-19 DIAGNOSIS — M25562 Pain in left knee: Secondary | ICD-10-CM | POA: Diagnosis not present

## 2015-06-19 DIAGNOSIS — R269 Unspecified abnormalities of gait and mobility: Secondary | ICD-10-CM

## 2015-06-19 DIAGNOSIS — R531 Weakness: Secondary | ICD-10-CM | POA: Diagnosis not present

## 2015-06-19 NOTE — Therapy (Addendum)
[facility] [address] [place], [place], [address] Phone: [phone]   Fax:  [phone]  Physical Therapy Treatment  Patient Details  Name: PEOLA JOYNT MRN: [id number] Date of Birth: [birth year] Referring Provider: Ollen Gross, MD  Encounter Date: 06/19/2015      PT End of Session - 06/19/15 0901    Visit Number 34  visit #12 in 2017, start KX visit #15   Number of Visits 39   Date for PT Re-Evaluation 07/12/15   PT Start Time 0844   PT Stop Time 0946   PT Time Calculation (min) 62 min   Activity Tolerance Patient tolerated treatment well;Other (comment)   Behavior During Therapy WFL for tasks assessed/performed      Past Medical History  Diagnosis Date  . PONV (postoperative nausea and vomiting)   . Bladder incontinence   . OSA on CPAP   . Arthritis     "qwhere; feet, knees, neck" (04/21/2012)  . Chronic back pain   . Closed angle glaucoma     "both eyes" (04/21/2012)  . Prediabetes   . Urinary tract bacterial infections   . Overactive bladder   . Numbness     fingertips bilat and feet bilat   . Peripheral neuropathy (HCC)     feet bilat   . Difficult intubation     with intubation on November 26, 2014  . Pneumonia     hx. of as child  . Anemia     Past Surgical History  Procedure Laterality Date  . Back surgery    . Knee arthroplasty  06/2008    "slipped on ice; torn ligaments & cartilage" (04/21/2012)  . Posterior lumbar fusion  04/21/2012  . Tonsillectomy and adenoidectomy  ~ 1950  . Abdominal hysterectomy  1980    "partial" (04/21/2012)  . Bladder suspension  1990  . Anterior fusion cervical spine  12/2005  . Tubal ligation  1970's  . Dilation and curettage of uterus  1980  . Appendectomy    . 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 Patellar  tendon repair Left 12/10/2014    Procedure: REPAIR PARTIAL QUAD TENDON TEAR;  Surgeon: Ollen Gross, MD;  Location: [facility];  Service: Orthopedics;  Laterality: Left;  . Knee closed reduction Left 03/25/2015    Procedure: CLOSED MANIPULATION LEFT KNEE;  Surgeon: Ollen Gross, MD;  Location: [facility];  Service: Orthopedics;  Laterality: Left;    There were no vitals filed for this visit.  Visit Diagnosis:  Weakness  Abnormality of gait  Swelling of limb  Stiffness of left knee  Left knee pain      Subjective Assessment - 06/19/15 0853    Subjective Pt saw MD on Tuesday Feb 6th, he ordered antibiotica for 6 more weeks to control infrection left knee, and iMD is may be considering a 2nd manipulation in left knee. Next MD visit is Feb 21,2017. Pt recieved shot in Rt knee due to pain     Patient is accompained by: Family member   Limitations Sitting   How long can you sit comfortably? not able to bend knee with sitting   How long can you stand comfortably? 30 minutes   How long can you walk comfortably? wallking is now limited by endurance not by pain in left knee   Patient Stated  Goals bend the knee, walk without crutch   Currently in Pain? No/denies      TREATMENT Bike rocking to improve ROM left knee x 10 min Rebounder  3 directions x 1 min each Leg press: 110# with B LE 3 x10,  60# with Lt LE Quadriceps stretch x 3 with 30 sec on stairs. Squatting holding on at kitchen sink with chair behind 2 x 10, flexion is improving Manual Soft tissue work: to left knee, gastrocnemius and hamstrings to release tightness Vasopneumatic device to left knee: 3 snowflakes, high compression x 15 min B LE elevated on wedge                            PT Short Term Goals - 06/17/15 0905    PT SHORT TERM GOAL #1   Title be independent in initial HEP   Time 3   Period Weeks   Status Achieved   PT SHORT TERM GOAL #2   Title demonstrate Lt knee AROM flexion > or = to 75  degrees   Time 3   Period Weeks   Status On-going           PT Long Term Goals - 06/17/15 0911    PT LONG TERM GOAL #1   Title be independent in advanced HEP   Time 8   Period Weeks   Status On-going   PT LONG TERM GOAL #2   Title improve FOTO to < or = to 48% limitaiton   Time 8   Period Weeks   Status Achieved   PT LONG TERM GOAL #3   Title demonstrate Lt knee AROM flexion to > or = to 95 degrees to improve sitting   Time 8   Period Weeks   Status On-going   PT LONG TERM GOAL #4   Title demonstrate 4+/5 Lt hip strength to improve gait   Time 8   Period Weeks   Status On-going   PT LONG TERM GOAL #5   Title wean from crutch and demonstrate Lt knee flexion with swing through phase of gait on level surface   Time 8   Period Weeks   Status On-going               Plan - 06/19/15 0906    Clinical Impression Statement Pt''s left knee continues to drain, edema is present and decreased strength and ROM. AROM left knee in degrees on bike flexion 59, on legpress with help of weight 60. Patient is slowly progressing, she had a delay due to inflammation in left knee, now on antibiotica..    Pt will benefit from skilled therapeutic intervention in order to improve on the following deficits Abnormal gait;Decreased activity tolerance;Difficulty walking;Increased edema;Impaired flexibility;Decreased strength;Hypomobility;Decreased range of motion;Decreased endurance;Decreased mobility;Decreased scar mobility;Increased muscle spasms;Pain;Increased fascial restricitons   Rehab Potential Good   Clinical Impairments Affecting Rehab Potential Lt knee manipulation TKA replacement in July 2016, Patellatendon rupture, Hematoma, open wound, Rt knee cortison shot on FEB 6th, 2017   PT Frequency 2x / week   PT Duration 8 weeks   PT Treatment/Interventions ADLs/Self Care Home Management;Cryotherapy;Electrical Stimulation;Gait training;Ultrasound;Moist Heat;Stair training;Functional mobility  training;Therapeutic activities;Therapeutic exercise;Neuromuscular re-education;Manual techniques;Patient/family education;Scar mobilization;Passive range of motion;Taping;Vasopneumatic Device   PT Next Visit Plan Focus to increase knee flexion. Continue with stretching and strenthening to patient's tolerance   PT Home Exercise Plan continue with current HEP   Consulted and Agree with Plan of Care Patient  Problem List Patient Active Problem List   Diagnosis Date Noted  . Arthrofibrosis of total knee arthroplasty (HCC) 03/25/2015  . Quadriceps tendon rupture 12/09/2014  . OA (osteoarthritis) of knee 11/26/2014    NAUMANN-HOUEGNIFIO,Lurdes Haltiwanger PTA 06/19/2015, 9:38 AM  [facility] [address] [place], [place], [address] Phone: [phone]   Fax:  [phone]  Name: SARY BOGIE MRN: [id number] Date of Birth: 1941-04-07

## 2015-06-24 ENCOUNTER — Ambulatory Visit: Payer: Medicare Other | Admitting: Physical Therapy

## 2015-06-24 ENCOUNTER — Encounter: Payer: Self-pay | Admitting: Physical Therapy

## 2015-06-24 DIAGNOSIS — R269 Unspecified abnormalities of gait and mobility: Secondary | ICD-10-CM

## 2015-06-24 DIAGNOSIS — R531 Weakness: Secondary | ICD-10-CM

## 2015-06-24 DIAGNOSIS — M7989 Other specified soft tissue disorders: Secondary | ICD-10-CM | POA: Diagnosis not present

## 2015-06-24 DIAGNOSIS — M25562 Pain in left knee: Secondary | ICD-10-CM | POA: Diagnosis not present

## 2015-06-24 DIAGNOSIS — M25662 Stiffness of left knee, not elsewhere classified: Secondary | ICD-10-CM

## 2015-06-24 NOTE — Therapy (Signed)
[facility] [address] [place], [place], [address] Phone: [phone]   Fax:  [phone]  Physical Therapy Treatment  Patient Details  Name: Lauren English MRN: [id number] Date of Birth: 17-May-1940 Referring Provider: Gaynelle Arabian, MD  Encounter Date: 06/24/2015      PT End of Session - 06/24/15 1213    Visit Number 35   Number of Visits 39   Date for PT Re-Evaluation 07/12/15   PT Start Time 4008   PT Stop Time 6761   PT Time Calculation (min) 62 min   Activity Tolerance Patient tolerated treatment well;Other (comment)   Behavior During Therapy WFL for tasks assessed/performed      Past Medical History  Diagnosis Date  . PONV (postoperative nausea and vomiting)   . Bladder incontinence   . OSA on CPAP   . Arthritis     "qwhere; feet, knees, neck" (04/21/2012)  . Chronic back pain   . Closed angle glaucoma     "both eyes" (04/21/2012)  . Prediabetes   . Urinary tract bacterial infections   . Overactive bladder   . Numbness     fingertips bilat and feet bilat   . Peripheral neuropathy (HCC)     feet bilat   . Difficult intubation     with intubation on November 26, 2014  . Pneumonia     hx. of as child  . Anemia     Past Surgical History  Procedure Laterality Date  . Back surgery    . Knee arthroplasty  06/2008    "slipped on ice; torn ligaments & cartilage" (04/21/2012)  . Posterior lumbar fusion  04/21/2012  . Tonsillectomy and adenoidectomy  ~ 1950  . Abdominal hysterectomy  1980    "partial" (04/21/2012)  . Bladder suspension  1990  . Anterior fusion cervical spine  12/2005  . Tubal ligation  1970's  . Dilation and curettage of uterus  1980  . Appendectomy    . Total knee arthroplasty Left 11/26/2014    Procedure: TOTAL LEFT   KNEE ARTHROPLASTY;  Surgeon: Gaynelle Arabian, MD;  Location: [facility];  Service: Orthopedics;  Laterality: Left;  . Patellar tendon repair Left 12/10/2014     Procedure: REPAIR PARTIAL QUAD TENDON TEAR;  Surgeon: Gaynelle Arabian, MD;  Location: [facility];  Service: Orthopedics;  Laterality: Left;  . Knee closed reduction Left 03/25/2015    Procedure: CLOSED MANIPULATION LEFT KNEE;  Surgeon: Gaynelle Arabian, MD;  Location: [facility];  Service: Orthopedics;  Laterality: Left;    There were no vitals filed for this visit.  Visit Diagnosis:  Weakness  Abnormality of gait  Swelling of limb  Stiffness of left knee  Left knee pain      Subjective Assessment - 06/24/15 1157    Subjective Pt reports for the first time since July of 2016 she was at church.   Limitations Sitting   How long can you sit comfortably? not able to bend knee with sitting   How long can you stand comfortably? 30 minutes   How long can you walk comfortably? wallking is now limited by endurance not by pain in left knee   Patient Stated Goals bend the knee, walk without crutch   Currently in Pain? No/denies   Multiple Pain Sites No                         [facility] Adult PT Treatment/Exercise - 06/24/15 0001  Exercises   Exercises Knee/Hip   Knee/Hip Exercises: Stretches   Quad Stretch Left;3 reps;30 seconds  on stairs   Knee/Hip Exercises: Aerobic   Recumbent Bike L 1 x2 min rocking to increase ROM   Nustep for bending Level 1x 10   Knee/Hip Exercises: Machines for Strengthening   Total Gym Leg Press Seat#7 110# 3x10, Lt only 60# 3x10   Knee/Hip Exercises: Standing   Step Down Right;3 sets;Hand Hold: 2  2" x 10, 4" x10, 6" x 10 very challenging with 6"   Stairs 8 using B rails, step to pattern, descending   Rebounder weightshift in 3 directions x 1 min each   Other Standing Knee Exercises squatting in available range with B UE holding on sink, chair behind 2 x 10   Vasopneumatic   Number Minutes Vasopneumatic  15 minutes   Vasopnuematic Location  Knee   Vasopneumatic Pressure Medium   Vasopneumatic Temperature  3 snowflakes                   PT Short Term Goals - 06/24/15 1220    PT SHORT TERM GOAL #1   Title be independent in initial HEP   Time 3   Period Weeks   Status Achieved   PT SHORT TERM GOAL #2   Title demonstrate Lt knee AROM flexion > or = to 75 degrees  65   Time 3   Period Weeks   Status Partially Met           PT Long Term Goals - 06/17/15 0911    PT LONG TERM GOAL #1   Title be independent in advanced HEP   Time 8   Period Weeks   Status On-going   PT LONG TERM GOAL #2   Title improve FOTO to < or = to 48% limitaiton   Time 8   Period Weeks   Status Achieved   PT LONG TERM GOAL #3   Title demonstrate Lt knee AROM flexion to > or = to 95 degrees to improve sitting   Time 8   Period Weeks   Status On-going   PT LONG TERM GOAL #4   Title demonstrate 4+/5 Lt hip strength to improve gait   Time 8   Period Weeks   Status On-going   PT LONG TERM GOAL #5   Title wean from crutch and demonstrate Lt knee flexion with swing through phase of gait on level surface   Time 8   Period Weeks   Status On-going               Plan - 06/24/15 1214    Clinical Impression Statement Pt continues to drain out of left knee and edema is present pt is still on Antibiotica. AROM left knee flexion, whille on legpress 65 degrees. Pt continues to improve, but now limited due to pain in Right LE.    Pt will benefit from skilled therapeutic intervention in order to improve on the following deficits Abnormal gait;Decreased activity tolerance;Difficulty walking;Increased edema;Impaired flexibility;Decreased strength;Hypomobility;Decreased range of motion;Decreased endurance;Decreased mobility;Decreased scar mobility;Increased muscle spasms;Pain;Increased fascial restricitons   Rehab Potential Good   Clinical Impairments Affecting Rehab Potential Lt knee manipulation TKA replacement in July 2016, Patellatendon rupture, Hematoma, open wound, Rt knee cortison shot on FEB 6th, 2017   PT Frequency 2x / week   PT  Duration 8 weeks   PT Treatment/Interventions ADLs/Self Care Home Management;Cryotherapy;Electrical Stimulation;Gait training;Ultrasound;Moist Heat;Stair training;Functional mobility training;Therapeutic activities;Therapeutic exercise;Neuromuscular re-education;Manual techniques;Patient/family education;Scar  mobilization;Passive range of motion;Taping;Vasopneumatic Device   PT Next Visit Plan Focus to increase knee flexion. Continue with stretching and strenthening to patient's tolerance   PT Home Exercise Plan continue with current HEP   Consulted and Agree with Plan of Care Patient        Problem List Patient Active Problem List   Diagnosis Date Noted  . Arthrofibrosis of total knee arthroplasty ([place]) 03/25/2015  . Quadriceps tendon rupture 12/09/2014  . OA (osteoarthritis) of knee 11/26/2014    NAUMANN-HOUEGNIFIO,Alaina Donati PTA 06/24/2015, 12:33 PM  [facility] [address] [place], [place], [address] Phone: [phone]   Fax:  [phone]  Name: Lauren English MRN: [id number] Date of Birth: 1941-02-15

## 2015-06-26 ENCOUNTER — Encounter: Payer: Self-pay | Admitting: Physical Therapy

## 2015-06-26 ENCOUNTER — Ambulatory Visit: Payer: Medicare Other | Admitting: Physical Therapy

## 2015-06-26 DIAGNOSIS — R531 Weakness: Secondary | ICD-10-CM

## 2015-06-26 DIAGNOSIS — M25562 Pain in left knee: Secondary | ICD-10-CM | POA: Diagnosis not present

## 2015-06-26 DIAGNOSIS — R269 Unspecified abnormalities of gait and mobility: Secondary | ICD-10-CM | POA: Diagnosis not present

## 2015-06-26 DIAGNOSIS — M25662 Stiffness of left knee, not elsewhere classified: Secondary | ICD-10-CM | POA: Diagnosis not present

## 2015-06-26 DIAGNOSIS — M7989 Other specified soft tissue disorders: Secondary | ICD-10-CM | POA: Diagnosis not present

## 2015-06-26 NOTE — Therapy (Signed)
[facility] [address] [place], [place], [address] Phone: [phone]   Fax:  [phone]  Physical Therapy Treatment  Patient Details  Name: Lauren English MRN: [id number] Date of Birth: 01-29-1941 Referring Provider: Gaynelle Arabian, MD  Encounter Date: 06/26/2015      PT End of Session - 06/26/15 0903    Visit Number 36   Number of Visits 39  visit # 14 in 2017, KX visit 15   Date for PT Re-Evaluation 07/12/15   PT Start Time 0849   PT Stop Time 0947   PT Time Calculation (min) 58 min   Activity Tolerance Patient tolerated treatment well;Other (comment)   Behavior During Therapy WFL for tasks assessed/performed      Past Medical History  Diagnosis Date  . PONV (postoperative nausea and vomiting)   . Bladder incontinence   . OSA on CPAP   . Arthritis     "qwhere; feet, knees, neck" (04/21/2012)  . Chronic back pain   . Closed angle glaucoma     "both eyes" (04/21/2012)  . Prediabetes   . Urinary tract bacterial infections   . Overactive bladder   . Numbness     fingertips bilat and feet bilat   . Peripheral neuropathy (HCC)     feet bilat   . Difficult intubation     with intubation on November 26, 2014  . Pneumonia     hx. of as child  . Anemia     Past Surgical History  Procedure Laterality Date  . Back surgery    . Knee arthroplasty  06/2008    "slipped on ice; torn ligaments & cartilage" (04/21/2012)  . Posterior lumbar fusion  04/21/2012  . Tonsillectomy and adenoidectomy  ~ 1950  . Abdominal hysterectomy  1980    "partial" (04/21/2012)  . Bladder suspension  1990  . Anterior fusion cervical spine  12/2005  . Tubal ligation  1970's  . Dilation and curettage of uterus  1980  . Appendectomy    . Total knee arthroplasty Left 11/26/2014    Procedure: TOTAL LEFT   KNEE ARTHROPLASTY;  Surgeon: Gaynelle Arabian, MD;  Location: [facility];  Service: Orthopedics;  Laterality: Left;  . Patellar tendon  repair Left 12/10/2014    Procedure: REPAIR PARTIAL QUAD TENDON TEAR;  Surgeon: Gaynelle Arabian, MD;  Location: [facility];  Service: Orthopedics;  Laterality: Left;  . Knee closed reduction Left 03/25/2015    Procedure: CLOSED MANIPULATION LEFT KNEE;  Surgeon: Gaynelle Arabian, MD;  Location: [facility];  Service: Orthopedics;  Laterality: Left;    There were no vitals filed for this visit.  Visit Diagnosis:  Weakness  Abnormality of gait  Swelling of limb  Stiffness of left knee  Left knee pain      Subjective Assessment - 06/26/15 0900    Subjective Pt is not wearing ACE wrap today, due to no drainage, but lat distal of left patella puffy area present. Pt reports her Right leg is hurting and limits her overall functional activities.    Patient is accompained by: Family member   Limitations Sitting   How long can you sit comfortably? not able to bend knee with sitting   How long can you stand comfortably? 30 minutes   How long can you walk comfortably? wallking is now limited by endurance not by pain in left knee   Patient Stated Goals bend the knee, walk without crutch  [facility] Adult PT Treatment/Exercise - 06/26/15 0001    Exercises   Exercises Knee/Hip   Knee/Hip Exercises: Stretches   Quad Stretch Left;4 reps;30 seconds  on stairs   Knee/Hip Exercises: Aerobic   Recumbent Bike L 1 x2 min rocking to increase ROM   Knee/Hip Exercises: Machines for Strengthening   Total Gym Leg Press Seat#7 110# 3x10, Lt only 60# 3x10   Knee/Hip Exercises: Standing   Step Down Right;3 sets;Hand Hold: 2   Rebounder weightshift in 3 directions x 1 min each   Vasopneumatic   Number Minutes Vasopneumatic  15 minutes   Vasopnuematic Location  Knee   Vasopneumatic Pressure Medium   Vasopneumatic Temperature  3 snowflakes                  PT Short Term Goals - 06/24/15 1220    PT SHORT TERM GOAL #1   Title be independent in initial HEP   Time 3    Period Weeks   Status Achieved   PT SHORT TERM GOAL #2   Title demonstrate Lt knee AROM flexion > or = to 75 degrees  65   Time 3   Period Weeks   Status Partially Met           PT Long Term Goals - 06/17/15 0911    PT LONG TERM GOAL #1   Title be independent in advanced HEP   Time 8   Period Weeks   Status On-going   PT LONG TERM GOAL #2   Title improve FOTO to < or = to 48% limitaiton   Time 8   Period Weeks   Status Achieved   PT LONG TERM GOAL #3   Title demonstrate Lt knee AROM flexion to > or = to 95 degrees to improve sitting   Time 8   Period Weeks   Status On-going   PT LONG TERM GOAL #4   Title demonstrate 4+/5 Lt hip strength to improve gait   Time 8   Period Weeks   Status On-going   PT LONG TERM GOAL #5   Title wean from crutch and demonstrate Lt knee flexion with swing through phase of gait on level surface   Time 8   Period Weeks   Status On-going               Plan - 06/26/15 0919    Clinical Impression Statement Pt is now limited due to pain in Rt knee. Lt knee with slighly elevated temperature, Lt distal lat patella puffy area, no drainage today, not wearing ace band only small bandage. Pt will continue to benefit from skilled PT to improve ROM and strength     Pt will benefit from skilled therapeutic intervention in order to improve on the following deficits Abnormal gait;Decreased activity tolerance;Difficulty walking;Increased edema;Impaired flexibility;Decreased strength;Hypomobility;Decreased range of motion;Decreased endurance;Decreased mobility;Decreased scar mobility;Increased muscle spasms;Pain;Increased fascial restricitons   Rehab Potential Good   Clinical Impairments Affecting Rehab Potential Lt knee manipulation TKA replacement in July 2016, Patellatendon rupture, Hematoma, open wound, Rt knee cortison shot on FEB 6th, 2017   PT Frequency 2x / week   PT Duration 8 weeks   PT Treatment/Interventions ADLs/Self Care Home  Management;Cryotherapy;Electrical Stimulation;Gait training;Ultrasound;Moist Heat;Stair training;Functional mobility training;Therapeutic activities;Therapeutic exercise;Neuromuscular re-education;Manual techniques;Patient/family education;Scar mobilization;Passive range of motion;Taping;Vasopneumatic Device   PT Next Visit Plan Focus to increase knee flexion. Continue with stretching and strenthening to patient's tolerance   PT Home Exercise Plan Pt has MD appointment on Tuesday  21, pcomplete ROM measurment   Consulted and Agree with Plan of Care Patient        Problem List Patient Active Problem List   Diagnosis Date Noted  . Arthrofibrosis of total knee arthroplasty ([place]) 03/25/2015  . Quadriceps tendon rupture 12/09/2014  . OA (osteoarthritis) of knee 11/26/2014    NAUMANN-HOUEGNIFIO,Turner Kunzman PTA 06/26/2015, 9:35 AM  [facility] [address] [place], [place], [address] Phone: [phone]   Fax:  [phone]  Name: Lauren English MRN: [id number] Date of Birth: 03-02-1941

## 2015-07-01 ENCOUNTER — Encounter: Payer: Self-pay | Admitting: Physical Therapy

## 2015-07-01 ENCOUNTER — Ambulatory Visit: Payer: Medicare Other | Admitting: Physical Therapy

## 2015-07-01 DIAGNOSIS — M25662 Stiffness of left knee, not elsewhere classified: Secondary | ICD-10-CM | POA: Diagnosis not present

## 2015-07-01 DIAGNOSIS — M7989 Other specified soft tissue disorders: Secondary | ICD-10-CM

## 2015-07-01 DIAGNOSIS — R531 Weakness: Secondary | ICD-10-CM | POA: Diagnosis not present

## 2015-07-01 DIAGNOSIS — M25562 Pain in left knee: Secondary | ICD-10-CM | POA: Diagnosis not present

## 2015-07-01 DIAGNOSIS — R269 Unspecified abnormalities of gait and mobility: Secondary | ICD-10-CM | POA: Diagnosis not present

## 2015-07-01 NOTE — Therapy (Signed)
[facility] [address], [place], [place], [address] Phone: [phone]   Fax:  [phone]  Physical Therapy Treatment  Patient Details  Name: Lauren English MRN: [id number] Date of Birth: Jan 15, 1941 Referring Provider: Gaynelle Arabian, MD  Encounter Date: 07/01/2015      PT End of Session - 07/01/15 0914    Visit Number 37   Number of Visits 39  visit#15 in 2017, KX starting today   Date for PT Re-Evaluation 07/12/15   PT Start Time 0844   PT Stop Time 0946   PT Time Calculation (min) 62 min   Activity Tolerance Patient tolerated treatment well;Other (comment)   Behavior During Therapy WFL for tasks assessed/performed      Past Medical History  Diagnosis Date  . PONV (postoperative nausea and vomiting)   . Bladder incontinence   . OSA on CPAP   . Arthritis     "qwhere; feet, knees, neck" (04/21/2012)  . Chronic back pain   . Closed angle glaucoma     "both eyes" (04/21/2012)  . Prediabetes   . Urinary tract bacterial infections   . Overactive bladder   . Numbness     fingertips bilat and feet bilat   . Peripheral neuropathy (HCC)     feet bilat   . Difficult intubation     with intubation on November 26, 2014  . Pneumonia     hx. of as child  . Anemia     Past Surgical History  Procedure Laterality Date  . Back surgery    . Knee arthroplasty  06/2008    "slipped on ice; torn ligaments & cartilage" (04/21/2012)  . Posterior lumbar fusion  04/21/2012  . Tonsillectomy and adenoidectomy  ~ 1950  . Abdominal hysterectomy  1980    "partial" (04/21/2012)  . Bladder suspension  1990  . Anterior fusion cervical spine  12/2005  . Tubal ligation  1970's  . Dilation and curettage of uterus  1980  . Appendectomy    . Total knee arthroplasty Left 11/26/2014    Procedure: TOTAL LEFT   KNEE ARTHROPLASTY;  Surgeon: Gaynelle Arabian, MD;  Location: [facility];  Service: Orthopedics;  Laterality: Left;  . Patellar  tendon repair Left 12/10/2014    Procedure: REPAIR PARTIAL QUAD TENDON TEAR;  Surgeon: Gaynelle Arabian, MD;  Location: [facility];  Service: Orthopedics;  Laterality: Left;  . Knee closed reduction Left 03/25/2015    Procedure: CLOSED MANIPULATION LEFT KNEE;  Surgeon: Gaynelle Arabian, MD;  Location: [facility];  Service: Orthopedics;  Laterality: Left;    There were no vitals filed for this visit.  Visit Diagnosis:  Weakness  Abnormality of gait  Swelling of limb  Stiffness of left knee  Left knee pain      Subjective Assessment - 07/01/15 0849    Subjective Pt is wearing ACE wrap again, left distal lat area is puffing up again after 5 days without Antibiotica. Pt reports her right leg is hurting a lot and feels very weak, negotiating stairs and transfers sit to stand is difficult due to Rt LE.      Patient is accompained by: Family member   Limitations Sitting   How long can you sit comfortably? not able to bend knee with sitting   How long can you stand comfortably? 30 minutes   How long can you walk comfortably? wallking is now limited by endurance not by pain in left knee   Patient Stated Goals bend  the knee, walk without crutch   Currently in Pain? No/denies   Multiple Pain Sites No            [facility] PT Assessment - 07/01/15 0001    Assessment   Medical Diagnosis S/P left total knee arthroplasty, wound debridement, Lt knee manipulation    Onset Date/Surgical Date 03/25/15   Next MD Visit 06/04/2015   Precautions   Precautions Fall   Balance Screen   Has the patient fallen in the past 6 months No   Has the patient had a decrease in activity level because of a fear of falling?  No   Is the patient reluctant to leave their home because of a fear of falling?  No   Prior Function   Level of Independence Independent with household mobility with device;Independent with basic ADLs   Vocation Retired   Associate Professor   Overall Cognitive Status Within Functional Limits for tasks assessed    Observation/Other Assessments   Observations no pitting edema in left knee   Skin Integrity Left knee is shiny, increased redness, increased warmth   Focus on Therapeutic Outcomes (FOTO)  47% limitations   as of 06/03/2015   ROM / Strength   AROM / PROM / Strength AROM   AROM   AROM Assessment Site Knee   Right/Left Knee Left                     [facility] Adult PT Treatment/Exercise - 07/01/15 0001    Exercises   Exercises Knee/Hip   Knee/Hip Exercises: Stretches   Quad Stretch Left;4 reps;30 seconds  on stairs   Knee/Hip Exercises: Aerobic   Recumbent Bike L 1 x10 min rocking to increase ROM   Knee/Hip Exercises: Machines for Strengthening   Total Gym Leg Press Seat#7  Lt only 60# 3x10  not B LE, due to pt believes that causes pain in RT LE   Knee/Hip Exercises: Standing   Step Down Right;3 sets;Hand Hold: 2  2" x 10, 4" x 10, 6' x 10 6" are challenging.    Stairs 8  using B rails, step over step pattern  able to perform, but challenging   Rebounder weightshift in 3 directions x 1 min each   Other Standing Knee Exercises squatting in available range with B UE holding on stairs, chair behind 2 x 10   Vasopneumatic   Number Minutes Vasopneumatic  15 minutes   Vasopnuematic Location  Knee   Vasopneumatic Pressure Medium   Vasopneumatic Temperature  3 snowflakes                  PT Short Term Goals - 06/24/15 1220    PT SHORT TERM GOAL #1   Title be independent in initial HEP   Time 3   Period Weeks   Status Achieved   PT SHORT TERM GOAL #2   Title demonstrate Lt knee AROM flexion > or = to 75 degrees  65   Time 3   Period Weeks   Status Partially Met           PT Long Term Goals - 06/17/15 0911    PT LONG TERM GOAL #1   Title be independent in advanced HEP   Time 8   Period Weeks   Status On-going   PT LONG TERM GOAL #2   Title improve FOTO to < or = to 48% limitaiton   Time 8   Period Weeks   Status Achieved  PT LONG TERM GOAL #3    Title demonstrate Lt knee AROM flexion to > or = to 95 degrees to improve sitting   Time 8   Period Weeks   Status On-going   PT LONG TERM GOAL #4   Title demonstrate 4+/5 Lt hip strength to improve gait   Time 8   Period Weeks   Status On-going   PT LONG TERM GOAL #5   Title wean from crutch and demonstrate Lt knee flexion with swing through phase of gait on level surface   Time 8   Period Weeks   Status On-going               Plan - 07/01/15 0915    Clinical Impression Statement Pt is slowly progressing with Lt LE, but without Antibiotica drainage returned in left knee. Right knee pain limits now all functional activities. AROM left knee in degrees 60, extension -8. Pt will continue to benefit from skilled PT   Pt will benefit from skilled therapeutic intervention in order to improve on the following deficits Abnormal gait;Decreased activity tolerance;Difficulty walking;Increased edema;Impaired flexibility;Decreased strength;Hypomobility;Decreased range of motion;Decreased endurance;Decreased mobility;Decreased scar mobility;Increased muscle spasms;Pain;Increased fascial restricitons   Rehab Potential Good   Clinical Impairments Affecting Rehab Potential Lt knee manipulation TKA replacement in July 2016, Patellatendon rupture, Hematoma, open wound, Rt knee cortison shot on FEB 6th, 2017   PT Frequency 2x / week   PT Duration 8 weeks   PT Treatment/Interventions ADLs/Self Care Home Management;Cryotherapy;Electrical Stimulation;Gait training;Ultrasound;Moist Heat;Stair training;Functional mobility training;Therapeutic activities;Therapeutic exercise;Neuromuscular re-education;Manual techniques;Patient/family education;Scar mobilization;Passive range of motion;Taping;Vasopneumatic Device   PT Next Visit Plan Focus to increase knee flexion. Continue with stretching and strenthening to patient's tolerance   PT Home Exercise Plan Pt has MD appointment on Tuesday 21, wait was MD has to  say.   Consulted and Agree with Plan of Care Patient        Problem List Patient Active Problem List   Diagnosis Date Noted  . Arthrofibrosis of total knee arthroplasty ([place]) 03/25/2015  . Quadriceps tendon rupture 12/09/2014  . OA (osteoarthritis) of knee 11/26/2014    NAUMANN-HOUEGNIFIO,Pati Thinnes PTA 07/01/2015, 1:36 PM  [facility] [address], [place], [place], [address] Phone: [phone]   Fax:  [phone]  Name: Lauren English MRN: [id number] Date of Birth: Sep 09, 1940

## 2015-07-02 DIAGNOSIS — Z471 Aftercare following joint replacement surgery: Secondary | ICD-10-CM | POA: Diagnosis not present

## 2015-07-02 DIAGNOSIS — Z96652 Presence of left artificial knee joint: Secondary | ICD-10-CM | POA: Diagnosis not present

## 2015-07-03 ENCOUNTER — Ambulatory Visit: Payer: Medicare Other

## 2015-07-03 DIAGNOSIS — R269 Unspecified abnormalities of gait and mobility: Secondary | ICD-10-CM

## 2015-07-03 DIAGNOSIS — M7989 Other specified soft tissue disorders: Secondary | ICD-10-CM

## 2015-07-03 DIAGNOSIS — R531 Weakness: Secondary | ICD-10-CM | POA: Diagnosis not present

## 2015-07-03 DIAGNOSIS — M25662 Stiffness of left knee, not elsewhere classified: Secondary | ICD-10-CM

## 2015-07-03 DIAGNOSIS — M25562 Pain in left knee: Secondary | ICD-10-CM

## 2015-07-03 NOTE — Therapy (Signed)
[facility] [address], [place], [place], [address] Phone: [phone]   Fax:  [phone]  Physical Therapy Treatment  Patient Details  Name: Lauren English MRN: [id number] Date of Birth: [birth year] Referring Provider: Gaynelle Arabian, MD  Encounter Date: 07/03/2015      PT End of Session - 07/03/15 0926    Visit Number 38   Number of Visits 39   Date for PT Re-Evaluation 07/12/15   Authorization Type 16 visits in 2017 KX modifier needed   PT Start Time 0847   PT Stop Time 0944   PT Time Calculation (min) 57 min   Activity Tolerance Patient tolerated treatment well   Behavior During Therapy [facility] for tasks assessed/performed      Past Medical History  Diagnosis Date  . PONV (postoperative nausea and vomiting)   . Bladder incontinence   . OSA on CPAP   . Arthritis     "qwhere; feet, knees, neck" (04/21/2012)  . Chronic back pain   . Closed angle glaucoma     "both eyes" (04/21/2012)  . Prediabetes   . Urinary tract bacterial infections   . Overactive bladder   . Numbness     fingertips bilat and feet bilat   . Peripheral neuropathy (HCC)     feet bilat   . Difficult intubation     with intubation on November 26, 2014  . Pneumonia     hx. of as child  . Anemia     Past Surgical History  Procedure Laterality Date  . Back surgery    . Knee arthroplasty  06/2008    "slipped on ice; torn ligaments & cartilage" (04/21/2012)  . Posterior lumbar fusion  04/21/2012  . Tonsillectomy and adenoidectomy  ~ 1950  . Abdominal hysterectomy  1980    "partial" (04/21/2012)  . Bladder suspension  1990  . Anterior fusion cervical spine  12/2005  . Tubal ligation  1970's  . Dilation and curettage of uterus  1980  . Appendectomy    . Total knee arthroplasty Left 11/26/2014    Procedure: TOTAL LEFT   KNEE ARTHROPLASTY;  Surgeon: Gaynelle Arabian, MD;  [facility]: [facility];  Service: Orthopedics;  Laterality: Left;  .  Patellar tendon repair Left 12/10/2014    Procedure: REPAIR PARTIAL QUAD TENDON TEAR;  Surgeon: Gaynelle Arabian, MD;  Location: [facility];  Service: Orthopedics;  Laterality: Left;  . Knee closed reduction Left 03/25/2015    Procedure: CLOSED MANIPULATION LEFT KNEE;  Surgeon: Gaynelle Arabian, MD;  [facility]: [facility];  Service: Orthopedics;  Laterality: Left;    There were no vitals filed for this visit.  Visit Diagnosis:  Weakness  Abnormality of gait  Swelling of limb  Stiffness of left knee  Left knee pain      Subjective Assessment - 07/03/15 0851    Subjective Pt saw MD.  Will continue with antibiotics.     Currently in Pain? No/denies                         [facility] Adult PT Treatment/Exercise - 07/03/15 0001    Exercises   Exercises Knee/Hip   Knee/Hip Exercises: Stretches   Quad Stretch Left;4 reps;30 seconds  on stairs   Knee/Hip Exercises: Aerobic   Recumbent Bike L 1 x10 min rocking to increase ROM   Nustep for ROM   Knee/Hip Exercises: Machines for Strengthening   Total Gym Leg Press Seat#7  Lt  only 60# 3x10  not B LE, due to pt believes that causes pain in RT LE   Knee/Hip Exercises: Standing   Step Down Right;3 sets;Hand Hold: 2  2" x 10, 4" x 10, 6' x 10 6" are challenging.    Rebounder weightshift in 3 directions x 1 min each   Other Standing Knee Exercises squatting in available range with B UE holding on stairs, chair behind 2 x 10   Vasopneumatic   Number Minutes Vasopneumatic  15 minutes   Vasopnuematic Location  Knee   Vasopneumatic Pressure Medium   Vasopneumatic Temperature  3 snowflakes                  PT Short Term Goals - 06/24/15 1220    PT SHORT TERM GOAL #1   Title be independent in initial HEP   Time 3   Period Weeks   Status Achieved   PT SHORT TERM GOAL #2   Title demonstrate Lt knee AROM flexion > or = to 75 degrees  65   Time 3   Period Weeks   Status Partially Met           PT Long Term Goals -  06/17/15 0911    PT LONG TERM GOAL #1   Title be independent in advanced HEP   Time 8   Period Weeks   Status On-going   PT LONG TERM GOAL #2   Title improve FOTO to < or = to 48% limitaiton   Time 8   Period Weeks   Status Achieved   PT LONG TERM GOAL #3   Title demonstrate Lt knee AROM flexion to > or = to 95 degrees to improve sitting   Time 8   Period Weeks   Status On-going   PT LONG TERM GOAL #4   Title demonstrate 4+/5 Lt hip strength to improve gait   Time 8   Period Weeks   Status On-going   PT LONG TERM GOAL #5   Title wean from crutch and demonstrate Lt knee flexion with swing through phase of gait on level surface   Time 8   Period Weeks   Status On-going               Plan - 07/03/15 0388    Clinical Impression Statement Pt continues to take antibiotics for Lt knee drainage.  Pt with continued Lt knee AROM deficits with 8-60.  Pt will likely discharge next week to HEP and gym exercises at [employer] due to plateau in progress.  Pt will benefit from 2 more sessions to progress gym exercises for independence after discharge.     Pt will benefit from skilled therapeutic intervention in order to improve on the following deficits Abnormal gait;Decreased activity tolerance;Difficulty walking;Increased edema;Impaired flexibility;Decreased strength;Hypomobility;Decreased range of motion;Decreased endurance;Decreased mobility;Decreased scar mobility;Increased muscle spasms;Pain;Increased fascial restricitons   Rehab Potential Good   Clinical Impairments Affecting Rehab Potential Lt knee manipulation TKA replacement in July 2016, Patellatendon rupture, Hematoma, open wound, Rt knee cortison shot on FEB 6th, 2017   PT Frequency 2x / week   PT Duration 8 weeks   PT Treatment/Interventions ADLs/Self Care Home Management;Cryotherapy;Electrical Stimulation;Gait training;Ultrasound;Moist Heat;Stair training;Functional mobility training;Therapeutic activities;Therapeutic  exercise;Neuromuscular re-education;Manual techniques;Patient/family education;Scar mobilization;Passive range of motion;Taping;Vasopneumatic Device   PT Next Visit Plan Focus to increase knee flexion. Continue with stretching and strenthening to patient's tolerance.  Build gym exercises         Problem List Patient Active Problem  List   Diagnosis Date Noted  . Arthrofibrosis of total knee arthroplasty ([place]) 03/25/2015  . Quadriceps tendon rupture 12/09/2014  . OA (osteoarthritis) of knee 11/26/2014    TAKACS,KELLY, PT 07/03/2015, 9:28 AM  [facility] [address] [place], [place], [address] Phone: [phone]   Fax:  [phone]  Name: Lauren English MRN: [id number] Date of Birth: [birth year]

## 2015-07-08 ENCOUNTER — Ambulatory Visit: Payer: Medicare Other

## 2015-07-08 DIAGNOSIS — M25562 Pain in left knee: Secondary | ICD-10-CM | POA: Diagnosis not present

## 2015-07-08 DIAGNOSIS — M7989 Other specified soft tissue disorders: Secondary | ICD-10-CM | POA: Diagnosis not present

## 2015-07-08 DIAGNOSIS — M25662 Stiffness of left knee, not elsewhere classified: Secondary | ICD-10-CM

## 2015-07-08 DIAGNOSIS — R269 Unspecified abnormalities of gait and mobility: Secondary | ICD-10-CM | POA: Diagnosis not present

## 2015-07-08 DIAGNOSIS — R531 Weakness: Secondary | ICD-10-CM | POA: Diagnosis not present

## 2015-07-08 NOTE — Therapy (Signed)
[facility] [address] [place], [place], [address] Phone: [phone]   Fax:  [phone]  Physical Therapy Treatment  Patient Details  Name: Lauren English MRN: [id number] Date of Birth: [birth year] Referring Provider: Gaynelle Arabian, MD  Encounter Date: 07/08/2015      PT End of Session - 07/08/15 0917    Visit Number 39   Number of Visits 39   Date for PT Re-Evaluation 07/12/15   Authorization Type 16 visits in 2017 KX modifier needed   PT Start Time 0844   PT Stop Time 0937   PT Time Calculation (min) 53 min   Activity Tolerance Patient tolerated treatment well   Behavior During Therapy [facility] for tasks assessed/performed      Past Medical History  Diagnosis Date  . PONV (postoperative nausea and vomiting)   . Bladder incontinence   . OSA on CPAP   . Arthritis     "qwhere; feet, knees, neck" (04/21/2012)  . Chronic back pain   . Closed angle glaucoma     "both eyes" (04/21/2012)  . Prediabetes   . Urinary tract bacterial infections   . Overactive bladder   . Numbness     fingertips bilat and feet bilat   . Peripheral neuropathy (HCC)     feet bilat   . Difficult intubation     with intubation on November 26, 2014  . Pneumonia     hx. of as child  . Anemia     Past Surgical History  Procedure Laterality Date  . Back surgery    . Knee arthroplasty  06/2008    "slipped on ice; torn ligaments & cartilage" (04/21/2012)  . Posterior lumbar fusion  04/21/2012  . Tonsillectomy and adenoidectomy  ~ 1950  . Abdominal hysterectomy  1980    "partial" (04/21/2012)  . Bladder suspension  1990  . Anterior fusion cervical spine  12/2005  . Tubal ligation  1970's  . Dilation and curettage of uterus  1980  . Appendectomy    . Total knee arthroplasty Left 11/26/2014    Procedure: TOTAL LEFT   KNEE ARTHROPLASTY;  Surgeon: Gaynelle Arabian, MD;  Location: [facility];  Service: Orthopedics;  Laterality: Left;  .  Patellar tendon repair Left 12/10/2014    Procedure: REPAIR PARTIAL QUAD TENDON TEAR;  Surgeon: Gaynelle Arabian, MD;  Location: [facility];  Service: Orthopedics;  Laterality: Left;  . Knee closed reduction Left 03/25/2015    Procedure: CLOSED MANIPULATION LEFT KNEE;  Surgeon: Gaynelle Arabian, MD;  Location: [facility];  Service: Orthopedics;  Laterality: Left;    There were no vitals filed for this visit.  Visit Diagnosis:  Weakness  Abnormality of gait  Swelling of limb  Stiffness of left knee      Subjective Assessment - 07/08/15 0843    Subjective Pt will D/C this week to HEP.  Pt will continue with strength and AROM exercises.  Pt plans to go to [employer] to work with her trainer.   Pt twisted her Rt knee last week.     Currently in Pain? No/denies            [facility] PT Assessment - 07/08/15 0001    Assessment   Medical Diagnosis S/P left total knee arthroplasty, wound debridement, Lt knee manipulation    Observation/Other Assessments   Focus on Therapeutic Outcomes (FOTO)  52% limitation  [place] [facility] PT Treatment/Exercise - 07/08/15 0001    Exercises   Exercises Knee/Hip   Knee/Hip Exercises: Stretches   Quad Stretch Left;4 reps;30 seconds  on stairs   Knee/Hip Exercises: Aerobic   Recumbent Bike L 1 x10 min rocking to increase ROM   Nustep for ROM   Knee/Hip Exercises: Machines for Strengthening   Total Gym Leg Press Seat#7  Lt only 60# 3x10  not B LE, due to pt believes that causes pain in RT LE   Knee/Hip Exercises: Standing   Step Down Right;3 sets;Hand Hold: 2  2" x 10, 4" x 10, 6' x 10 6" are challenging.    Rebounder weightshift in 3 directions x 1 min each   Other Standing Knee Exercises squatting in available range with B UE holding on stairs, chair behind 2 x 10   Vasopneumatic   Number Minutes Vasopneumatic  15 minutes   Vasopnuematic Location  Knee   Vasopneumatic Pressure Medium   Vasopneumatic Temperature  3 snowflakes                   PT Short Term Goals - 06/24/15 1220    PT SHORT TERM GOAL #1   Title be independent in initial HEP   Time 3   Period Weeks   Status Achieved   PT SHORT TERM GOAL #2   Title demonstrate Lt knee AROM flexion > or = to 75 degrees  65   Time 3   Period Weeks   Status Partially Met           PT Long Term Goals - 06/17/15 0911    PT LONG TERM GOAL #1   Title be independent in advanced HEP   Time 8   Period Weeks   Status On-going   PT LONG TERM GOAL #2   Title improve FOTO to < or = to 48% limitaiton   Time 8   Period Weeks   Status Achieved   PT LONG TERM GOAL #3   Title demonstrate Lt knee AROM flexion to > or = to 95 degrees to improve sitting   Time 8   Period Weeks   Status On-going   PT LONG TERM GOAL #4   Title demonstrate 4+/5 Lt hip strength to improve gait   Time 8   Period Weeks   Status On-going   PT LONG TERM GOAL #5   Title wean from crutch and demonstrate Lt knee flexion with swing through phase of gait on level surface   Time 8   Period Weeks   Status On-going               Plan - 07/08/15 8786    Clinical Impression Statement Pt continues to demonstrate limited Lt knee AROM.  Pt will plan to discharge next session to HEP and will go to the gym to work with the trainer.  Pt will attend 1 more session to finalize HEP and gym exercises.     Pt will benefit from skilled therapeutic intervention in order to improve on the following deficits Abnormal gait;Decreased activity tolerance;Difficulty walking;Increased edema;Impaired flexibility;Decreased strength;Hypomobility;Decreased range of motion;Decreased endurance;Decreased mobility;Decreased scar mobility;Increased muscle spasms;Pain;Increased fascial restricitons   Rehab Potential Good   Clinical Impairments Affecting Rehab Potential Lt knee manipulation TKA replacement in July 2016, Patellatendon rupture, Hematoma, open wound, Rt knee cortison shot on FEB 6th, 2017   PT  Frequency 2x / week   PT Duration 8 weeks   PT Treatment/Interventions ADLs/Self  Care Home Management;Cryotherapy;Electrical Stimulation;Gait training;Ultrasound;Moist Heat;Stair training;Functional mobility training;Therapeutic activities;Therapeutic exercise;Neuromuscular re-education;Manual techniques;Patient/family education;Scar mobilization;Passive range of motion;Taping;Vasopneumatic Device   PT Next Visit Plan G-codes and D/C next session.  Final measurements including ROM and edema   Consulted and Agree with Plan of Care Patient          G-Codes - August 01, 2015 0859    Functional Assessment Tool Used FOTO 52% limitation   Functional Limitation Mobility: Walking and moving around   Mobility: Walking and Moving Around Current Status [phone]) At least 40 percent but less than 60 percent impaired, limited or restricted   Mobility: Walking and Moving Around Goal Status [phone]) At least 40 percent but less than 60 percent impaired, limited or restricted      Problem List Patient Active Problem List   Diagnosis Date Noted  . Arthrofibrosis of total knee arthroplasty ([place]) 03/25/2015  . Quadriceps tendon rupture 12/09/2014  . OA (osteoarthritis) of knee 11/26/2014    Cattie Tineo, PT 2015-08-01, 9:20 AM  [facility] [address] [place], [place], [address] Phone: [phone]   Fax:  [phone]  Name: Lauren English MRN: [id number] Date of Birth: 01-Oct-1940

## 2015-07-10 ENCOUNTER — Ambulatory Visit: Payer: Medicare Other | Attending: Orthopedic Surgery

## 2015-07-10 DIAGNOSIS — R269 Unspecified abnormalities of gait and mobility: Secondary | ICD-10-CM | POA: Diagnosis not present

## 2015-07-10 DIAGNOSIS — R531 Weakness: Secondary | ICD-10-CM | POA: Diagnosis not present

## 2015-07-10 DIAGNOSIS — M7989 Other specified soft tissue disorders: Secondary | ICD-10-CM | POA: Diagnosis not present

## 2015-07-10 DIAGNOSIS — M25662 Stiffness of left knee, not elsewhere classified: Secondary | ICD-10-CM | POA: Insufficient documentation

## 2015-07-10 NOTE — Therapy (Signed)
[facility] [address] [place], [place], [address] Phone: [phone]   Fax:  [phone]  Physical Therapy Treatment  Patient Details  Name: Lauren English MRN: [id number] Date of Birth: [birth year] Referring Provider: Gaynelle Arabian, MD  Encounter Date: 07/10/2015      PT End of Session - 07/10/15 0919    Visit Number 61   PT Start Time 7564   PT Stop Time 0935   PT Time Calculation (min) 57 min   Activity Tolerance Patient tolerated treatment well   Behavior During Therapy [facility] for tasks assessed/performed      Past Medical History  Diagnosis Date  . PONV (postoperative nausea and vomiting)   . Bladder incontinence   . OSA on CPAP   . Arthritis     "qwhere; feet, knees, neck" (04/21/2012)  . Chronic back pain   . Closed angle glaucoma     "both eyes" (04/21/2012)  . Prediabetes   . Urinary tract bacterial infections   . Overactive bladder   . Numbness     fingertips bilat and feet bilat   . Peripheral neuropathy (HCC)     feet bilat   . Difficult intubation     with intubation on November 26, 2014  . Pneumonia     hx. of as child  . Anemia     Past Surgical History  Procedure Laterality Date  . Back surgery    . Knee arthroplasty  06/2008    "slipped on ice; torn ligaments & cartilage" (04/21/2012)  . Posterior lumbar fusion  04/21/2012  . Tonsillectomy and adenoidectomy  ~ 1950  . Abdominal hysterectomy  1980    "partial" (04/21/2012)  . Bladder suspension  1990  . Anterior fusion cervical spine  12/2005  . Tubal ligation  1970's  . Dilation and curettage of uterus  1980  . Appendectomy    . Total knee arthroplasty Left 11/26/2014    Procedure: TOTAL LEFT   KNEE ARTHROPLASTY;  Surgeon: Gaynelle Arabian, MD;  Location: [facility];  Service: Orthopedics;  Laterality: Left;  . Patellar tendon repair Left 12/10/2014    Procedure: REPAIR PARTIAL QUAD TENDON TEAR;  Surgeon: Gaynelle Arabian, MD;  Location: [facility];  Service: Orthopedics;  Laterality: Left;  . Knee closed reduction Left 03/25/2015    Procedure: CLOSED MANIPULATION LEFT KNEE;  Surgeon: Gaynelle Arabian, MD;  Location: [facility];  Service: Orthopedics;  Laterality: Left;    There were no vitals filed for this visit.  Visit Diagnosis:  Weakness  Abnormality of gait  Swelling of limb  Stiffness of left knee      Subjective Assessment - 07/10/15 0855    Subjective Ready for D/C to gym exercises and HEP.  Pt will go to [employer] to work with trainer and do gym exercises.     Currently in Pain? 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  Previous measure taken in sitting   Strength   Overall Strength Deficits   Right/Left Hip Left   Left Hip Flexion 5/5   Right/Left Knee Left   Left Knee Flexion 4+/5   Left Knee Extension 5/5   Palpation   Palpation comment mild pitting edema in the Lt LE about the knee and ankle.  Joint is wrapped in ace wrap due to continued drainage.                     [place] [facility] PT Treatment/Exercise - 07/10/15 0001    Knee/Hip Exercises: Aerobic   Recumbent Bike L 1 x10 min rocking to increase ROM   Nustep for ROM   Knee/Hip Exercises: Machines for Strengthening   Total Gym Leg Press Seat#7  Lt only 70# 3x10  not B LE, due to pt believes that causes pain in RT LE   Knee/Hip Exercises: Standing    Step Down Right;3 sets;Hand Hold: 2  2" x 10, 4" x 10, 6' x 10 6" are challenging.    Rebounder weightshift in 3 directions x 1 min each   Other Standing Knee Exercises squatting in available range with B UE holding on stairs, chair behind 2 x 10   Vasopneumatic   Number Minutes Vasopneumatic  15 minutes   Vasopnuematic Location  Knee   Vasopneumatic Pressure Medium   Vasopneumatic Temperature  3 snowflakes                  PT Short Term Goals - 06/24/15 1220    PT SHORT TERM GOAL #1   Title be independent in initial HEP   Time 3   Period Weeks   Status Achieved   PT SHORT TERM GOAL #2   Title demonstrate Lt knee AROM flexion > or = to 75 degrees  65   Time 3   Period Weeks   Status Partially Met           PT Long Term Goals - 07/10/15 2992    PT LONG TERM GOAL #1   Title be independent in advanced HEP   Status Achieved   PT LONG TERM GOAL #2   Title improve FOTO to < or = to 48% limitaiton   Status Partially Met   PT LONG TERM GOAL #3   Title demonstrate Lt knee AROM flexion to > or = to 95 degrees to improve sitting   Status Partially Met  54 degrees in supine   PT LONG TERM GOAL #4   Title demonstrate 4+/5 Lt hip strength to improve gait   Status Achieved   PT LONG TERM GOAL #5   Title wean from crutch and demonstrate Lt knee flexion with swing through phase of gait on level surface   Status Partially Met               Plan - 07/10/15 0842    Clinical Impression Statement Pt will D/C to HEP and gym exercises at [employer].  Pt with continued Lt knee AROM deficits s/p knee replacement, quad tendon repair and complications with infection in the Lt knee.  Pt with gait abnormality due to limited Lt knee flexion during swing through phase of gait.  Pt will continue with HEP for strength and AROM gains.     Clinical Impairments Affecting Rehab Potential Lt knee manipulation TKA replacement in  July 2016, Patellatendon rupture, Hematoma, open wound,  Rt knee cortison shot on FEB 6th, 2017   PT Next Visit Plan D/C PT to HEP          G-Codes - 08-Aug-2015 0840    Functional Assessment Tool Used FOTO 52% limitation   Functional Limitation Mobility: Walking and moving around   Mobility: Walking and Moving Around Goal Status [phone]) At least 40 percent but less than 60 percent impaired, limited or restricted   Mobility: Walking and Moving Around Discharge Status [phone]) At least 40 percent but less than 60 percent impaired, limited or restricted      Problem List Patient Active Problem List   Diagnosis Date Noted  . Arthrofibrosis of total knee arthroplasty ([place]) 03/25/2015  . Quadriceps tendon rupture 12/09/2014  . OA (osteoarthritis) of knee 11/26/2014  PHYSICAL THERAPY DISCHARGE SUMMARY  Visits from Start of Care: 40  Current functional level related to goals / functional outcomes: See above.     Remaining deficits: See above for current deficits.     Education / Equipment: HEP Plan: Patient agrees to discharge.  Patient goals were partially met. Patient is being discharged due to being pleased with the current functional level.  ?????      TAKACS,KELLY, PT 08-08-15, 9:21 AM  [facility] [address] [place], [place], [address] Phone: [phone]   Fax:  [phone]  Name: KELLIS TOPETE MRN: [id number] Date of Birth: [birth year]

## 2015-07-11 ENCOUNTER — Other Ambulatory Visit (HOSPITAL_BASED_OUTPATIENT_CLINIC_OR_DEPARTMENT_OTHER): Payer: Medicare Other

## 2015-07-11 ENCOUNTER — Telehealth: Payer: Self-pay | Admitting: Oncology

## 2015-07-11 ENCOUNTER — Ambulatory Visit (HOSPITAL_BASED_OUTPATIENT_CLINIC_OR_DEPARTMENT_OTHER): Payer: Medicare Other | Admitting: Oncology

## 2015-07-11 VITALS — BP 152/64 | HR 72 | Temp 98.1°F | Resp 18 | Ht 70.0 in | Wt 191.5 lb

## 2015-07-11 DIAGNOSIS — D500 Iron deficiency anemia secondary to blood loss (chronic): Secondary | ICD-10-CM

## 2015-07-11 DIAGNOSIS — L0390 Cellulitis, unspecified: Secondary | ICD-10-CM

## 2015-07-11 DIAGNOSIS — D509 Iron deficiency anemia, unspecified: Secondary | ICD-10-CM

## 2015-07-11 LAB — CBC WITH DIFFERENTIAL/PLATELET
BASO%: 0.6 % (ref 0.0–2.0)
Basophils Absolute: 0 10*3/uL (ref 0.0–0.1)
EOS%: 3.6 % (ref 0.0–7.0)
Eosinophils Absolute: 0.2 10*3/uL (ref 0.0–0.5)
HCT: 33.3 % — ABNORMAL LOW (ref 34.8–46.6)
HGB: 10.5 g/dL — ABNORMAL LOW (ref 11.6–15.9)
LYMPH%: 40 % (ref 14.0–49.7)
MCH: 24.4 pg — ABNORMAL LOW (ref 25.1–34.0)
MCHC: 31.5 g/dL (ref 31.5–36.0)
MCV: 77.4 fL — AB (ref 79.5–101.0)
MONO#: 0.4 10*3/uL (ref 0.1–0.9)
MONO%: 7 % (ref 0.0–14.0)
NEUT%: 48.8 % (ref 38.4–76.8)
NEUTROS ABS: 2.6 10*3/uL (ref 1.5–6.5)
PLATELETS: 176 10*3/uL (ref 145–400)
RBC: 4.3 10*6/uL (ref 3.70–5.45)
RDW: 17.3 % — ABNORMAL HIGH (ref 11.2–14.5)
WBC: 5.3 10*3/uL (ref 3.9–10.3)
lymph#: 2.1 10*3/uL (ref 0.9–3.3)

## 2015-07-11 LAB — IRON AND TIBC
%SAT: 17 % — AB (ref 21–57)
IRON: 38 ug/dL — AB (ref 41–142)
TIBC: 229 ug/dL — ABNORMAL LOW (ref 236–444)
UIBC: 191 ug/dL (ref 120–384)

## 2015-07-11 LAB — FERRITIN: Ferritin: 135 ng/ml (ref 9–269)

## 2015-07-11 NOTE — Telephone Encounter (Signed)
Gave and printed appt sched and avs for pt for June °

## 2015-07-11 NOTE — Progress Notes (Signed)
Hematology and Oncology Follow Up Visit  Lauren English [id number] 1940-06-23 75 y.o. 07/11/2015 1:28 PM   Principle Diagnosis: 75 year old woman with iron deficiency anemia related to chronic blood loss diagnosed in July 2016. She presented with a hemoglobin 9.4, MCV 74 and RDW of 16.9. She had multiple orthopedic surgeries and a hematoma in her left knee accounting for her iron deficiency.   Current therapy: Oral iron replacement with ferrous sulfate 325 mg twice a day started in January 2017.  Interim History: Lauren English presents today for a follow-up visit. Since the last visit, she started taking oral iron replacement and developed some mild constipation. She is switched to a different iron formulation that included a stool softener although have not been taking it regularly. She feels that her constipation is better but she is not quite sure that she got enough iron as of late. She recently switched to ferrous sulfate twice a day in last week. She reports no other complications associated with this medication. He is already taking stool softeners. Denied any dyspepsia or abdominal pain. She still have some fatigue but still participating in physical therapy because of her knee. She continues to have recurrent infections in that knee that required ciprofloxacin.  She does not report any hematochezia or melena. Does not report any hemoptysis or hematemesis. Does not report any constitutional symptoms or decline in her overall health.   She does not report any headaches, blurry vision, syncope or seizures. She does not report any fevers, chills sweats. Does not report any cough, wheezing or hemoptysis. Does not report any nausea, vomiting, abdominal pain. Does not report any frequency, urgency or hesitancy. She does not report any skeletal complaints. Remaining review of systems unremarkable.    Medications: I have reviewed the patient's current medications.  Current Outpatient Prescriptions   Medication Sig Dispense Refill  . ACETYLCARNITINE PO Take 1 capsule by mouth daily.    Marland Kitchen aspirin EC 81 MG tablet Take 81 mg by mouth daily.    . calcium carbonate (OS-CAL) 600 MG tablet Take 600 mg by mouth 2 (two) times daily with a meal.    . Cholecalciferol (VITAMIN D3) 2000 units TABS Take 1 tablet by mouth daily.    Marland Kitchen docusate sodium (COLACE) 100 MG capsule Take 100 mg by mouth 2 (two) times daily.    . ferrous sulfate 325 (65 FE) MG tablet Take 325 mg by mouth daily with breakfast.    . RESVERATROL PO Take 1 capsule by mouth daily.    . solifenacin (VESICARE) 10 MG tablet Take 10 mg by mouth daily.    . traMADol (ULTRAM) 50 MG tablet Take 1-2 tablets (50-100 mg total) by mouth every 6 (six) hours as needed (mild pain). 60 tablet 1  . TURMERIC PO Take 1 capsule by mouth every evening.     No current facility-administered medications for this visit.     Allergies:  Allergies  Allergen Reactions  . Oxycodone Rash    Past Medical History, Surgical history, Social history, and Family History were reviewed and updated.   Physical Exam: Blood pressure 152/64, pulse 72, temperature 98.1 F (36.7 C), temperature source Oral, resp. rate 18, height  (1.778 m), weight 191 lb 8 oz (86.864 kg), SpO2 98 %. ECOG: 1 General appearance: alert and cooperative Head: Normocephalic, without obvious abnormality Neck: no adenopathy Lymph nodes: Cervical, supraclavicular, and axillary nodes normal. Heart:regular rate and rhythm, S1, S2 normal, no murmur, click, rub or gallop Lung:chest clear, no  wheezing, rales, normal symmetric air entry Abdomin: soft, non-tender, without masses or organomegaly EXT:no erythema, induration, or nodules   Lab Results: Lab Results  Component Value Date   WBC 5.3 07/11/2015   HGB 10.5* 07/11/2015   HCT 33.3* 07/11/2015   MCV 77.4* 07/11/2015   PLT 176 07/11/2015     Chemistry      Component Value Date/Time   NA 140 12/09/2014 1637   K 4.2  12/09/2014 1637   CL 106 12/09/2014 1637   CO2 24 12/09/2014 1637   BUN 11 12/09/2014 1637   CREATININE 0.81 12/09/2014 1637      Component Value Date/Time   CALCIUM 9.0 12/09/2014 1637   ALKPHOS 49 11/19/2014 1440   AST 20 11/19/2014 1440   ALT 18 11/19/2014 1440   BILITOT 0.5 11/19/2014 1440         Impression and Plan:  75 year old woman with the following issues:  1. Microcytic anemia with a hemoglobin of 9.4, MCV of 74 and RDW of 16.9. She had a normal hemoglobin in July 2016 and subsequently underwent multiple orthopedic operations on her left knee including knee replacement, tenden repair and developed a hematoma but have contributed to her anemia and likely anemia of chronic blood loss. She has no GI blood losses and up-to-date on her colonoscopies.  She have been on intermittent oral iron replacement for the last 2 months and her hemoglobin has stabilized. She reports that for a period of time she feels that she has not been taking iron properly and now taken it as instructed. I reviewed the option to use IV iron to eliminate any very abilities and oral intake but for the time being she declined.  The plan is to continue with oral iron using ferrous sulfate twice a day and to avoid antiacids. I encouraged her to take it with vitamin C for better absorption. We'll recheck her counts and 3 months to insure adequate replacement.  2. Cellulitis of the right: She continued to have recurrent infections which certainly increase her iron demand and might contribute to her anemia at this time.  3. Screening colonoscopy: She is up-to-date at this time without evidence of GI bleeding.  4. Follow-up: will be in 3 months.  Eli Hose, MD 3/2/20171:28 PM

## 2015-07-23 DIAGNOSIS — Z96652 Presence of left artificial knee joint: Secondary | ICD-10-CM | POA: Diagnosis not present

## 2015-07-23 DIAGNOSIS — Z471 Aftercare following joint replacement surgery: Secondary | ICD-10-CM | POA: Diagnosis not present

## 2015-08-01 DIAGNOSIS — H40013 Open angle with borderline findings, low risk, bilateral: Secondary | ICD-10-CM | POA: Diagnosis not present

## 2015-08-08 DIAGNOSIS — Z0000 Encounter for general adult medical examination without abnormal findings: Secondary | ICD-10-CM | POA: Diagnosis not present

## 2015-08-08 DIAGNOSIS — R3915 Urgency of urination: Secondary | ICD-10-CM | POA: Diagnosis not present

## 2015-08-13 DIAGNOSIS — Z471 Aftercare following joint replacement surgery: Secondary | ICD-10-CM | POA: Diagnosis not present

## 2015-08-13 DIAGNOSIS — Z96652 Presence of left artificial knee joint: Secondary | ICD-10-CM | POA: Diagnosis not present

## 2015-09-10 DIAGNOSIS — Z471 Aftercare following joint replacement surgery: Secondary | ICD-10-CM | POA: Diagnosis not present

## 2015-09-10 DIAGNOSIS — Z96652 Presence of left artificial knee joint: Secondary | ICD-10-CM | POA: Diagnosis not present

## 2015-10-12 ENCOUNTER — Ambulatory Visit: Payer: Self-pay | Admitting: Orthopedic Surgery

## 2015-10-17 ENCOUNTER — Other Ambulatory Visit (HOSPITAL_BASED_OUTPATIENT_CLINIC_OR_DEPARTMENT_OTHER): Payer: Medicare Other

## 2015-10-17 ENCOUNTER — Telehealth: Payer: Self-pay | Admitting: Oncology

## 2015-10-17 ENCOUNTER — Ambulatory Visit (HOSPITAL_BASED_OUTPATIENT_CLINIC_OR_DEPARTMENT_OTHER): Payer: Medicare Other | Admitting: Oncology

## 2015-10-17 VITALS — BP 132/56 | HR 67 | Temp 98.2°F | Resp 17 | Ht 70.0 in | Wt 192.8 lb

## 2015-10-17 DIAGNOSIS — D509 Iron deficiency anemia, unspecified: Secondary | ICD-10-CM

## 2015-10-17 DIAGNOSIS — L03115 Cellulitis of right lower limb: Secondary | ICD-10-CM

## 2015-10-17 DIAGNOSIS — D500 Iron deficiency anemia secondary to blood loss (chronic): Secondary | ICD-10-CM

## 2015-10-17 LAB — CBC WITH DIFFERENTIAL/PLATELET
BASO%: 0.7 % (ref 0.0–2.0)
BASOS ABS: 0 10*3/uL (ref 0.0–0.1)
EOS%: 2.6 % (ref 0.0–7.0)
Eosinophils Absolute: 0.1 10*3/uL (ref 0.0–0.5)
HCT: 39.4 % (ref 34.8–46.6)
HEMOGLOBIN: 12.7 g/dL (ref 11.6–15.9)
LYMPH%: 37.8 % (ref 14.0–49.7)
MCH: 26.6 pg (ref 25.1–34.0)
MCHC: 32.2 g/dL (ref 31.5–36.0)
MCV: 82.5 fL (ref 79.5–101.0)
MONO#: 0.4 10*3/uL (ref 0.1–0.9)
MONO%: 8.1 % (ref 0.0–14.0)
NEUT#: 2.6 10*3/uL (ref 1.5–6.5)
NEUT%: 50.8 % (ref 38.4–76.8)
Platelets: 173 10*3/uL (ref 145–400)
RBC: 4.77 10*6/uL (ref 3.70–5.45)
RDW: 15.5 % — AB (ref 11.2–14.5)
WBC: 5.2 10*3/uL (ref 3.9–10.3)
lymph#: 1.9 10*3/uL (ref 0.9–3.3)

## 2015-10-17 LAB — IRON AND TIBC
%SAT: 22 % (ref 21–57)
IRON: 55 ug/dL (ref 41–142)
TIBC: 248 ug/dL (ref 236–444)
UIBC: 192 ug/dL (ref 120–384)

## 2015-10-17 LAB — FERRITIN: FERRITIN: 162 ng/mL (ref 9–269)

## 2015-10-17 NOTE — Progress Notes (Signed)
Hematology and Oncology Follow Up Visit  Lauren English [id number] 1941/01/01 75 y.o. 10/17/2015 10:38 AM   Principle Diagnosis: 75 year old woman with iron deficiency anemia related to chronic blood loss diagnosed in July 2016. She presented with a hemoglobin 9.4, MCV 74 and RDW of 16.9. She had multiple orthopedic surgeries and a hematoma in her left knee accounting for her iron deficiency.   Current therapy: Oral iron replacement with ferrous sulfate 325 mg twice a day started in January 2017.  Interim History: Lauren English presents today for a follow-up visit. Since the last visit, she reports feeling much better. Her energy has improved dramatically and her exercise tolerance have normalized. She continues to be active and continuing rehabilitation in her left knee. She has continued taking oral iron replacement twice a day and have tolerated it well. She denied major issues such as dyspepsia or hematochezia. She did report constipation that is manageable. She denied any other bleeding at this time.  She does not report any headaches, blurry vision, syncope or seizures. She does not report any fevers, chills sweats. Does not report any cough, wheezing or hemoptysis. She does not report any chest pain, dyspnea on exertion or difficulty breathing. Does not report any nausea, vomiting, abdominal pain. Does not report any frequency, urgency or hesitancy. She does not report any skeletal complaints. Remaining review of systems unremarkable.    Medications: I have reviewed the patient's current medications.  Current Outpatient Prescriptions  Medication Sig Dispense Refill  . ACETYLCARNITINE PO Take 1 capsule by mouth daily.    Marland Kitchen. aspirin EC 81 MG tablet Take 81 mg by mouth daily.    . calcium carbonate (OS-CAL) 600 MG tablet Take 600 mg by mouth 2 (two) times daily with a meal.    . Cholecalciferol (VITAMIN D3) 2000 units TABS Take 1 tablet by mouth daily.    Marland Kitchen. docusate sodium (COLACE) 100 MG  capsule Take 100 mg by mouth 2 (two) times daily.    . ferrous sulfate 325 (65 FE) MG tablet Take 325 mg by mouth daily with breakfast.    . MYRBETRIQ 50 MG TB24 tablet Take 50 mg by mouth daily.    Marland Kitchen. RESVERATROL PO Take 1 capsule by mouth daily.    . traMADol (ULTRAM) 50 MG tablet Take 1-2 tablets (50-100 mg total) by mouth every 6 (six) hours as needed (mild pain). 60 tablet 1  . TURMERIC PO Take 1 capsule by mouth every evening.     No current facility-administered medications for this visit.     Allergies:  Allergies  Allergen Reactions  . Oxycodone Rash    Past Medical History, Surgical history, Social history, and Family History were reviewed and updated.   Physical Exam: Blood pressure 132/56, pulse 67, temperature 98.2 F (36.8 C), temperature source Oral, resp. rate 17, height 5\' 10"  (1.778 m), weight 192 lb 12.8 oz (87.454 kg), SpO2 99 %. ECOG: 1 General appearance: alert and cooperative appeared without distress. Head: Normocephalic, without obvious abnormality no oral ulcers or lesions. Neck: no adenopathy Lymph nodes: Cervical, supraclavicular, and axillary nodes normal. Heart:regular rate and rhythm, S1, S2 normal, no murmur, click, rub or gallop Lung:chest clear, no wheezing, rales, normal symmetric air entry Abdomin: soft, non-tender, without masses or organomegaly shifting dullness or ascites. EXT:no erythema, induration, or nodules   Lab Results: Lab Results  Component Value Date   WBC 5.2 10/17/2015   HGB 12.7 10/17/2015   HCT 39.4 10/17/2015   MCV 82.5 10/17/2015  PLT 173 10/17/2015     Chemistry      Component Value Date/Time   NA 140 12/09/2014 1637   K 4.2 12/09/2014 1637   CL 106 12/09/2014 1637   CO2 24 12/09/2014 1637   BUN 11 12/09/2014 1637   CREATININE 0.81 12/09/2014 1637      Component Value Date/Time   CALCIUM 9.0 12/09/2014 1637   ALKPHOS 49 11/19/2014 1440   AST 20 11/19/2014 1440   ALT 18 11/19/2014 1440   BILITOT 0.5  11/19/2014 1440         Impression and Plan:  75 year old woman with the following issues:  1. Microcytic anemia with a hemoglobin of 9.4, MCV of 74 and RDW of 16.9 noted in July 2016. She had a normal hemoglobin in July 2016 and subsequently underwent multiple orthopedic operations on her left knee including knee replacement, tenden repair and developed a hematoma but have contributed to her anemia and likely anemia of chronic blood loss. She has no GI blood losses and up-to-date on her colonoscopies.  She has been on oral iron twice a day with excellent response to therapy. Her hemoglobin have normalized and she is asymptomatic. She denied major complications associated with iron but did report mild constipation. She is planning to have another surgery in the near future and I have recommended that she continues iron until after her surgery in anticipation for future blood loss. I anticipate she will not require any further iron replacement after she recovers from her next operation.  She will follow-up in 3 months and we will recheck her hemoglobin at that time. If she is fully recovered and iron stores have been completely replaced we will discontinue oral iron at that time.  2. Cellulitis of the right lower extremity: This have resolved at this time she is no longer on antibiotics.  3. Screening colonoscopy: She is up-to-date at this time without evidence of GI bleeding.  4. Follow-up: will be in 3 months.  [facility], MD 6/8/201710:38 AM

## 2015-10-17 NOTE — Telephone Encounter (Signed)
Gave pt apt & avs °

## 2015-10-22 DIAGNOSIS — Z471 Aftercare following joint replacement surgery: Secondary | ICD-10-CM | POA: Diagnosis not present

## 2015-10-22 DIAGNOSIS — Z96652 Presence of left artificial knee joint: Secondary | ICD-10-CM | POA: Diagnosis not present

## 2015-10-29 DIAGNOSIS — M129 Arthropathy, unspecified: Secondary | ICD-10-CM | POA: Diagnosis not present

## 2015-10-29 DIAGNOSIS — Z01818 Encounter for other preprocedural examination: Secondary | ICD-10-CM | POA: Diagnosis not present

## 2015-11-07 NOTE — Progress Notes (Signed)
07/26/2015-Pre-operative clearance Dr. Paulino RilyWolters on chart. 04/02/2015- Last office visit from Dr. Paulino RilyWolters on chart.

## 2015-11-07 NOTE — Patient Instructions (Addendum)
Lauren English  11/07/2015   Your procedure is scheduled on: Monday 11/18/2015  [facility]  Entrance [place]  elevators to 3rd floor to  Short Stay Center at  [phone] AM.  Call this number if you have problems the morning of surgery [phone]   Remember: ONLY 1 PERSON MAY GO WITH YOU TO SHORT STAY TO GET  READY MORNING OF YOUR SURGERY.   Do not eat food or drink liquids :After Midnight.   PLEASE BRING CPAP MASK AND TUBING WITH YOU TO HOSPITAL MORNING OF SURGERY!   Take these medicines the morning of surgery with A SIP OF WATER: none              You may not have any metal on your body including hair pins and              piercings  Do not wear jewelry, make-up, lotions, powders or perfumes, deodorant             Do not wear nail polish.  Do not shave  48 hours prior to surgery.              Men may shave face and neck.   Do not bring valuables to the hospital. [place] IS NOT             RESPONSIBLE   FOR VALUABLES.  Contacts, dentures or bridgework may not be worn into surgery.  Leave suitcase in the car. After surgery it may be brought to your room.     Patients discharged the day of surgery will not be allowed to drive home.  Name and phone number of your driver:  Special Instructions: N/A              Please read over the following fact sheets you were given: _____________________________________________________________________             [facility] - Preparing for [facility] Before surgery, you can play an important role.  Because skin is not sterile, your skin needs to be as free of germs as possible.  You can reduce the number of germs on your skin by washing with CHG (chlorahexidine gluconate) soap before surgery.  CHG is an antiseptic cleaner which kills germs and bonds with the skin to continue killing germs even after washing. Please DO NOT use if you have an allergy to CHG or antibacterial soaps.  If your skin becomes  reddened/irritated stop using the CHG and inform your nurse when you arrive at Short Stay. Do not shave (including legs and underarms) for at least 48 hours prior to the first CHG shower.  You may shave your face/neck. Please follow these instructions carefully:  1.  Shower with CHG Soap the night before surgery and the  morning of Surgery.  2.  If you choose to wash your hair, wash your hair first as usual with your  normal  shampoo.  3.  After you shampoo, rinse your hair and body thoroughly to remove the  shampoo.                           4.  Use CHG as you would any other liquid soap.  You can apply chg directly  to the skin and wash  Gently with a scrungie or clean washcloth.  5.  Apply the CHG Soap to your body ONLY FROM THE NECK DOWN.   Do not use on face/ open                           Wound or open sores. Avoid contact with eyes, ears mouth and genitals (private parts).                       Wash face,  Genitals (private parts) with your normal soap.             6.  Wash thoroughly, paying special attention to the area where your surgery  will be performed.  7.  Thoroughly rinse your body with warm water from the neck down.  8.  DO NOT shower/wash with your normal soap after using and rinsing off  the CHG Soap.                9.  Pat yourself dry with a clean towel.            10.  Wear clean pajamas.            11.  Place clean sheets on your bed the night of your first shower and do not  sleep with pets. Day of Surgery : Do not apply any lotions/deodorants the morning of surgery.  Please wear clean clothes to the hospital/surgery center.  FAILURE TO FOLLOW THESE INSTRUCTIONS MAY RESULT IN THE CANCELLATION OF YOUR SURGERY PATIENT SIGNATURE_________________________________  NURSE SIGNATURE__________________________________  ________________________________________________________________________   Lauren English  An incentive spirometer is a tool that  can help keep your lungs clear and active. This tool measures how well you are filling your lungs with each breath. Taking long deep breaths may help reverse or decrease the chance of developing breathing (pulmonary) problems (especially infection) following:  A long period of time when you are unable to move or be active. BEFORE THE PROCEDURE   If the spirometer includes an indicator to show your best effort, your nurse or respiratory therapist will set it to a desired goal.  If possible, sit up straight or lean slightly forward. Try not to slouch.  Hold the incentive spirometer in an upright position. INSTRUCTIONS FOR USE   Sit on the edge of your bed if possible, or sit up as far as you can in bed or on a chair.  Hold the incentive spirometer in an upright position.  Breathe out normally.  Place the mouthpiece in your mouth and seal your lips tightly around it.  Breathe in slowly and as deeply as possible, raising the piston or the ball toward the top of the column.  Hold your breath for 3-5 seconds or for as long as possible. Allow the piston or ball to fall to the bottom of the column.  Remove the mouthpiece from your mouth and breathe out normally.  Rest for a few seconds and repeat Steps 1 through 7 at least 10 times every 1-2 hours when you are awake. Take your time and take a few normal breaths between deep breaths.  The spirometer may include an indicator to show your best effort. Use the indicator as a goal to work toward during each repetition.  After each set of 10 deep breaths, practice coughing to be sure your lungs are clear. If you have an incision (the cut made at the time of surgery),  support your incision when coughing by placing a pillow or rolled up towels firmly against it. Once you are able to get out of bed, walk around indoors and cough well. You may stop using the incentive spirometer when instructed by your caregiver.  RISKS AND COMPLICATIONS  Take your  time so you do not get dizzy or light-headed.  If you are in pain, you may need to take or ask for pain medication before doing incentive spirometry. It is harder to take a deep breath if you are having pain. AFTER USE  Rest and breathe slowly and easily.  It can be helpful to keep track of a log of your progress. Your caregiver can provide you with a simple table to help with this. If you are using the spirometer at home, follow these instructions: [place] IF:   You are having difficultly using the spirometer.  You have trouble using the spirometer as often as instructed.  Your pain medication is not giving enough relief while using the spirometer.  You develop fever of 100.5 F (38.1 C) or higher. SEEK IMMEDIATE MEDICAL CARE IF:   You cough up bloody sputum that had not been present before.  You develop fever of 102 F (38.9 C) or greater.  You develop worsening pain at or near the incision site. MAKE SURE YOU:   Understand these instructions.  Will watch your condition.  Will get help right away if you are not doing well or get worse. Document Released: 09/07/2006 Document Revised: 07/20/2011 Document Reviewed: 11/08/2006 Exit[employer] Patient Information 2014 Exit[employer], [place].   ________________________________________________________________________  WHAT IS A BLOOD TRANSFUSION? Blood Transfusion Information  A transfusion is the replacement of blood or some of its parts. Blood is made up of multiple cells which provide different functions.  Red blood cells carry oxygen and are used for blood loss replacement.  White blood cells fight against infection.  Platelets control bleeding.  Plasma helps clot blood.  Other blood products are available for specialized needs, such as hemophilia or other clotting disorders. BEFORE THE TRANSFUSION  Who gives blood for transfusions?   Healthy volunteers who are fully evaluated to make sure their blood is safe. This  is blood bank blood. Transfusion therapy is the safest it has ever been in the practice of medicine. Before blood is taken from a donor, a complete history is taken to make sure that person has no history of diseases nor engages in risky social behavior (examples are intravenous drug use or sexual activity with multiple partners). The donor's travel history is screened to minimize risk of transmitting infections, such as malaria. The donated blood is tested for signs of infectious diseases, such as HIV and hepatitis. The blood is then tested to be sure it is compatible with you in order to minimize the chance of a transfusion reaction. If you or a relative donates blood, this is often done in anticipation of surgery and is not appropriate for emergency situations. It takes many days to process the donated blood. RISKS AND COMPLICATIONS Although transfusion therapy is very safe and saves many lives, the main dangers of transfusion include:   Getting an infectious disease.  Developing a transfusion reaction. This is an allergic reaction to something in the blood you were given. Every precaution is taken to prevent this. The decision to have a blood transfusion has been considered carefully by your caregiver before blood is given. Blood is not given unless the benefits outweigh the risks. AFTER THE TRANSFUSION  Right after receiving a blood transfusion, you will usually feel much better and more energetic. This is especially true if your red blood cells have gotten low (anemic). The transfusion raises the level of the red blood cells which carry oxygen, and this usually causes an energy increase.  The nurse administering the transfusion will monitor you carefully for complications. HOME CARE INSTRUCTIONS  No special instructions are needed after a transfusion. You may find your energy is better. Speak with your caregiver about any limitations on activity for underlying diseases you may have. SEEK  MEDICAL CARE IF:   Your condition is not improving after your transfusion.  You develop redness or irritation at the intravenous (IV) site. SEEK IMMEDIATE MEDICAL CARE IF:  Any of the following symptoms occur over the next 12 hours:  Shaking chills.  You have a temperature by mouth above 102 F (38.9 C), not controlled by medicine.  Chest, back, or muscle pain.  People around you feel you are not acting correctly or are confused.  Shortness of breath or difficulty breathing.  Dizziness and fainting.  You get a rash or develop hives.  You have a decrease in urine output.  Your urine turns a dark color or changes to pink, red, or brown. Any of the following symptoms occur over the next 10 days:  You have a temperature by mouth above 102 F (38.9 C), not controlled by medicine.  Shortness of breath.  Weakness after normal activity.  The white part of the eye turns yellow (jaundice).  You have a decrease in the amount of urine or are urinating less often.  Your urine turns a dark color or changes to pink, red, or brown. Document Released: 04/24/2000 Document Revised: 07/20/2011 Document Reviewed: 12/12/2007 [facility] Patient Information 2014 [place], [place].  _______________________________________________________________________

## 2015-11-08 ENCOUNTER — Encounter (HOSPITAL_COMMUNITY): Payer: Self-pay

## 2015-11-08 ENCOUNTER — Encounter (HOSPITAL_COMMUNITY)
Admission: RE | Admit: 2015-11-08 | Discharge: 2015-11-08 | Disposition: A | Discharge status: Home / Self Care | Payer: Medicare Other | Source: Ambulatory Visit | Attending: Orthopedic Surgery | Admitting: Orthopedic Surgery

## 2015-11-08 DIAGNOSIS — Z0183 Encounter for blood typing: Secondary | ICD-10-CM | POA: Diagnosis not present

## 2015-11-08 DIAGNOSIS — Z01812 Encounter for preprocedural laboratory examination: Secondary | ICD-10-CM | POA: Insufficient documentation

## 2015-11-08 DIAGNOSIS — M1711 Unilateral primary osteoarthritis, right knee: Secondary | ICD-10-CM | POA: Diagnosis not present

## 2015-11-08 LAB — CBC
HEMATOCRIT: 39.8 % (ref 36.0–46.0)
HEMOGLOBIN: 12.9 g/dL (ref 12.0–15.0)
MCH: 27.2 pg (ref 26.0–34.0)
MCHC: 32.4 g/dL (ref 30.0–36.0)
MCV: 83.8 fL (ref 78.0–100.0)
Platelets: 187 10*3/uL (ref 150–400)
RBC: 4.75 MIL/uL (ref 3.87–5.11)
RDW: 14.4 % (ref 11.5–15.5)
WBC: 4.5 10*3/uL (ref 4.0–10.5)

## 2015-11-08 LAB — PROTIME-INR
INR: 1.01 (ref 0.00–1.49)
Prothrombin Time: 13.1 seconds (ref 11.6–15.2)

## 2015-11-08 LAB — URINE MICROSCOPIC-ADD ON

## 2015-11-08 LAB — SURGICAL PCR SCREEN
MRSA, PCR: NEGATIVE
Staphylococcus aureus: NEGATIVE

## 2015-11-08 LAB — URINALYSIS, ROUTINE W REFLEX MICROSCOPIC
Bilirubin Urine: NEGATIVE
Glucose, UA: NEGATIVE mg/dL
Hgb urine dipstick: NEGATIVE
Ketones, ur: NEGATIVE mg/dL
Nitrite: NEGATIVE
Protein, ur: NEGATIVE mg/dL
Specific Gravity, Urine: 1.019 (ref 1.005–1.030)
pH: 5.5 (ref 5.0–8.0)

## 2015-11-08 LAB — APTT: aPTT: 27 seconds (ref 24–37)

## 2015-11-08 NOTE — Progress Notes (Addendum)
10/29/15- EKG and labs-CMP and office visit note  on chart from Dr. Paulino RilyWolters.

## 2015-11-17 ENCOUNTER — Ambulatory Visit: Payer: Self-pay | Admitting: Orthopedic Surgery

## 2015-11-17 NOTE — H&P (Signed)
Fonda KinderJanet G Wrench DOB: 02/18/1941 Married / Language: Lenox Ponds[place] / Race: White Female Date of Admission:  11/18/2015 CC:  Right Knee Pain History of Present Illness  The patient is a 75 year old female who comes in for a preoperative History and Physical. The patient is scheduled for a right total knee arthroplasty to be performed by Dr. Gus RankinFrank V. Aluisio, MD at [facility] on 11/18/2015. She is status post left total knee replacement. The left knee continues to improve. The right knee is increasingly problematic. She is at a stage where she is ready to just go ahead and get scheduled for the right knee replacement. The patient is a 75 year old female who presented for follow up of their knee. The patient has been followed for their bilateral knee pain and osteoarthritis. Symptoms reported include: pain, aching, instability and difficulty ambulating. The patient feels that they are doing poorly and report their pain level to be moderate. She has also had increased pain in the right knee recently. She states that the left knee is doing better but continues to have increasing difficulty with the right one now. She had documented advanced arthritis in both knees but has had the left knee repalced and now presents for the right knee replacement at this time. They have been treated conservatively in the past for the above stated problem and despite conservative measures, they continue to have progressive pain and severe functional limitations and dysfunction. They have failed non-operative management including home exercise, medications, and injections. It is felt that they would benefit from undergoing total joint replacement. Risks and benefits of the procedure have been discussed with the patient and they elect to proceed with surgery. There are no active contraindications to surgery such as ongoing infection or rapidly progressive neurological disease.  Problem List/Past Medical  Status post total  left knee replacement (W09.811(Z96.652)  Encounter for care following Quadriceps Tendon Repair (Z47.89)  Peripheral Neuropathy  Sleep Apnea  uses CPAP Primary localized osteoarthritis of left knee (M17.12)  Tenosynovitis, radial styloid (727.04) [11/18/2006]: Plantar fasciitis (M72.2) [12/15/2006]: Lumbago (M54.5) [02/22/2007]: Symptom, disturbance of skin sensation (782.0) [02/22/2007]: Osteoarthrosis NOS, lower leg (715.96) [12/21/2008]: Dislocation, patella, closed (836.3) [12/21/2008]: Tear, lateral meniscus, knee, current (836.1) [04/29/2009]: Pain in joint, ankle and foot (M25.579) [04/29/2009]: Chondromalacia, 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 Patient IS ABLE to take Hydrocodone  Family History Cancer  Mother. Osteoarthritis  Father, Mother.  Social History  Exercise  Exercises rarely; does running / walking and gym / weights Current drinker  06/16/2013: Currently drinks wine less than 5 times per week Children  3 Living situation  live with spouse Current work status  retired Number of flights of stairs before winded  2-3 Tobacco / smoke exposure  06/16/2013: no Tobacco use  Never smoker. 06/16/2013 Marital status  married No history of drug/alcohol rehab  Not under pain contract  Advance Directives  Healthcare POA Post-Surgical Plans  HOME  Medication History Myrbetriq (50MG  Tablet ER 24HR, Oral) Active. Iron (Oral) Specific strength unknown - Active. Aspirin (81MG  Tablet DR, Oral) Active. Acetyl L-Carnitine (Oral) Specific strength unknown - Active. Calcium Carbonate (600MG  Tablet, 2 Oral daily) Active. Vitamin D (400UNIT Tablet, 2 Oral daily) Active. Omega 3 (Oral) Specific strength unknown - Active. Turmeric (Oral) Specific strength unknown - Active. Glucosamine  Complex (Oral) Active. Ibuprofen (200MG  Tablet, 4 Oral at bedtime)  Active.  Past Surgical History Spinal Fusion  Date: 04/2012. Lam/Fusion - Dr. Jenkins Spinal Surgery  Tonsillectomy  Date: 1948. Adnoids Neck Disc Surgery  Date: 07/2005. Cervical Disc Fusion Arthroscopy of Knee  left Breast Biopsy  bilateral Hysterectomy  Date: 01/1979. partial (non-cancerous) Tubal Ligation  Total Knee Replacement - Left  Left Knee Surgery  Date: 08/2008. Bladder Tack  Date: 08/1989.    Review of Systems  General Not Present- Chills, Fatigue, Fever, Memory Loss, Night Sweats, Weight Gain and Weight Loss. Skin Not Present- Eczema, Hives, Itching, Lesions and Rash. HEENT Not Present- Dentures, Double Vision, Headache, Hearing Loss, Tinnitus and Visual Loss. Respiratory Not Present- Allergies, Chronic Cough, Coughing up blood, Shortness of breath at rest and Shortness of breath with exertion. Cardiovascular Not Present- Chest Pain, Difficulty Breathing Lying Down, Murmur, Palpitations, Racing/skipping heartbeats and Swelling. Gastrointestinal Not Present- Abdominal Pain, Bloody Stool, Constipation, Diarrhea, Difficulty Swallowing, Heartburn, Jaundice, Loss of appetitie, Nausea and Vomiting. Female Genitourinary Not Present- Blood in Urine, Discharge, Flank Pain, Incontinence, Painful Urination, Urgency, Urinary frequency, Urinary Retention, Urinating at Night and Weak urinary stream. Musculoskeletal Present- Joint Pain. Not Present- Back Pain, Joint Swelling, Morning Stiffness, Muscle Pain, Muscle Weakness and Spasms. Neurological Not Present- Blackout spells, Difficulty with balance, Dizziness, Paralysis, Tremor and Weakness. Psychiatric Not Present- Insomnia.  Vitals Weight: 189 lb Height: 68in Body Surface Area: 2 m Body Mass Index: 28.74 kg/m  Pulse: 68 (Regular)  BP: 124/70 (Sitting, Right Arm, Standard)   Physical Exam General Mental Status -Alert, cooperative and good  historian. General Appearance-pleasant, Not in acute distress. Orientation-Oriented X3. Build & Nutrition-Well nourished and Well developed.  Head and Neck Head-normocephalic, atraumatic . Neck Global Assessment - supple, no bruit auscultated on the right, no bruit auscultated on the left.  Eye Vision-Wears contact lenses. Pupil - Bilateral-Regular and Round. Motion - Bilateral-EOMI.  Chest and Lung Exam Auscultation Breath sounds - clear at anterior chest wall and clear at posterior chest wall. Adventitious sounds - No Adventitious sounds.  Cardiovascular Auscultation Rhythm - Regular rate and rhythm. Heart Sounds - S1 WNL and S2 WNL. Murmurs & Other Heart Sounds - Auscultation of the heart reveals - No Murmurs.  Abdomen Palpation/Percussion Tenderness - Abdomen is non-tender to palpation. Rigidity (guarding) - Abdomen is soft. Auscultation Auscultation of the abdomen reveals - Bowel sounds normal.  Female Genitourinary Note: Not done, not pertinent to present illness   Musculoskeletal Note: Right knee - no effusion, range 5-120, marked crepitus on range of motion; tenderness, medial greater than lateral, with no instability. Pulses, sensation, and motor are intact, both lower extremities.   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 Wolters - Patient has been seen preoperatively and felt to be stable for surgery.  IV TXA  Anesthesia Issues: Vocal cord issues following the last surgery.  [facility] Study Patient Virtual Therapy - No Home Health  Signed electronically by Alezandrew L Kavontae Pritchard, III PA-C   

## 2015-11-18 ENCOUNTER — Inpatient Hospital Stay (HOSPITAL_COMMUNITY): Payer: Medicare Other | Admitting: Anesthesiology

## 2015-11-18 ENCOUNTER — Inpatient Hospital Stay (HOSPITAL_COMMUNITY)
Admission: RE | Admit: 2015-11-18 | Discharge: 2015-11-20 | DRG: 470 | Disposition: A | Discharge status: Home / Self Care | Payer: Medicare Other | Source: Ambulatory Visit | Attending: Orthopedic Surgery | Admitting: Orthopedic Surgery

## 2015-11-18 ENCOUNTER — Encounter (HOSPITAL_COMMUNITY)
Admission: RE | Disposition: A | Discharge status: Home / Self Care | Payer: Self-pay | Source: Ambulatory Visit | Attending: Orthopedic Surgery

## 2015-11-18 ENCOUNTER — Encounter (HOSPITAL_COMMUNITY): Payer: Self-pay | Admitting: *Deleted

## 2015-11-18 DIAGNOSIS — R32 Unspecified urinary incontinence: Secondary | ICD-10-CM | POA: Diagnosis not present

## 2015-11-18 DIAGNOSIS — G4733 Obstructive sleep apnea (adult) (pediatric): Secondary | ICD-10-CM | POA: Diagnosis not present

## 2015-11-18 DIAGNOSIS — R7303 Prediabetes: Secondary | ICD-10-CM | POA: Diagnosis present

## 2015-11-18 DIAGNOSIS — M1710 Unilateral primary osteoarthritis, unspecified knee: Secondary | ICD-10-CM | POA: Diagnosis present

## 2015-11-18 DIAGNOSIS — M24662 Ankylosis, left knee: Secondary | ICD-10-CM | POA: Diagnosis not present

## 2015-11-18 DIAGNOSIS — Z981 Arthrodesis status: Secondary | ICD-10-CM | POA: Diagnosis not present

## 2015-11-18 DIAGNOSIS — M179 Osteoarthritis of knee, unspecified: Secondary | ICD-10-CM | POA: Diagnosis present

## 2015-11-18 DIAGNOSIS — M1711 Unilateral primary osteoarthritis, right knee: Principal | ICD-10-CM | POA: Diagnosis present

## 2015-11-18 DIAGNOSIS — Z96652 Presence of left artificial knee joint: Secondary | ICD-10-CM | POA: Diagnosis not present

## 2015-11-18 DIAGNOSIS — G629 Polyneuropathy, unspecified: Secondary | ICD-10-CM | POA: Diagnosis present

## 2015-11-18 DIAGNOSIS — M25561 Pain in right knee: Secondary | ICD-10-CM | POA: Diagnosis not present

## 2015-11-18 HISTORY — PX: TOTAL KNEE ARTHROPLASTY: SHX125

## 2015-11-18 HISTORY — PX: KNEE CLOSED REDUCTION: SHX995

## 2015-11-18 LAB — TYPE AND SCREEN
ABO/RH(D): O POS
ANTIBODY SCREEN: NEGATIVE

## 2015-11-18 SURGERY — ARTHROPLASTY, KNEE, TOTAL
Anesthesia: Spinal | Site: Knee | Laterality: Right

## 2015-11-18 MED ORDER — METOCLOPRAMIDE HCL 5 MG PO TABS: 5.0000 mg | ORAL_TABLET | Freq: Three times a day (TID) | ORAL | Status: DC | PRN

## 2015-11-18 MED ORDER — ONDANSETRON HCL 4 MG PO TABS: 4.0000 mg | ORAL_TABLET | Freq: Four times a day (QID) | ORAL | Status: DC | PRN

## 2015-11-18 MED ORDER — SODIUM CHLORIDE 0.9 % IJ SOLN
INTRAMUSCULAR | Status: DC | PRN
  Administered 2015-11-18: 30 mL via INTRAVENOUS

## 2015-11-18 MED ORDER — BUPIVACAINE IN DEXTROSE 0.75-8.25 % IT SOLN
INTRATHECAL | Status: DC | PRN
  Administered 2015-11-18: 1.8 mL via INTRATHECAL

## 2015-11-18 MED ORDER — MIRABEGRON ER 50 MG PO TB24
50.0000 mg | ORAL_TABLET | Freq: Every day | ORAL | Status: DC
  Administered 2015-11-18 – 2015-11-20 (×3): 50 mg via ORAL
  Filled 2015-11-18 (×3): qty 1

## 2015-11-18 MED ORDER — METHOCARBAMOL 500 MG PO TABS
500.0000 mg | ORAL_TABLET | Freq: Four times a day (QID) | ORAL | Status: DC | PRN
  Administered 2015-11-18 – 2015-11-20 (×4): 500 mg via ORAL
  Filled 2015-11-18 (×3): qty 1

## 2015-11-18 MED ORDER — PHENYLEPHRINE 40 MCG/ML (10ML) SYRINGE FOR IV PUSH (FOR BLOOD PRESSURE SUPPORT)
PREFILLED_SYRINGE | INTRAVENOUS | Status: AC
  Filled 2015-11-18: qty 10

## 2015-11-18 MED ORDER — ACETAMINOPHEN 500 MG PO TABS
1000.0000 mg | ORAL_TABLET | Freq: Four times a day (QID) | ORAL | Status: AC
  Administered 2015-11-18 – 2015-11-19 (×4): 1000 mg via ORAL
  Filled 2015-11-18 (×4): qty 2

## 2015-11-18 MED ORDER — FENTANYL CITRATE (PF) 100 MCG/2ML IJ SOLN
INTRAMUSCULAR | Status: DC | PRN
  Administered 2015-11-18 (×2): 50 ug via INTRAVENOUS

## 2015-11-18 MED ORDER — PROMETHAZINE HCL 25 MG/ML IJ SOLN: 6.2500 mg | INTRAMUSCULAR | Status: DC | PRN

## 2015-11-18 MED ORDER — DEXAMETHASONE SODIUM PHOSPHATE 10 MG/ML IJ SOLN
10.0000 mg | Freq: Once | INTRAMUSCULAR | Status: AC
  Administered 2015-11-19: 10 mg via INTRAVENOUS
  Filled 2015-11-18: qty 1

## 2015-11-18 MED ORDER — POLYVINYL ALCOHOL 1.4 % OP SOLN
1.0000 [drp] | OPHTHALMIC | Status: DC | PRN
  Filled 2015-11-18: qty 15

## 2015-11-18 MED ORDER — HYDROMORPHONE HCL 1 MG/ML IJ SOLN
0.2500 mg | INTRAMUSCULAR | Status: DC | PRN
  Administered 2015-11-18 (×4): 0.5 mg via INTRAVENOUS

## 2015-11-18 MED ORDER — ACETAMINOPHEN 10 MG/ML IV SOLN
INTRAVENOUS | Status: AC
  Filled 2015-11-18: qty 100

## 2015-11-18 MED ORDER — HYDROMORPHONE HCL 2 MG PO TABS
2.0000 mg | ORAL_TABLET | ORAL | Status: DC | PRN
  Administered 2015-11-18 (×2): 2 mg via ORAL
  Filled 2015-11-18 (×2): qty 1

## 2015-11-18 MED ORDER — PHENYLEPHRINE 40 MCG/ML (10ML) SYRINGE FOR IV PUSH (FOR BLOOD PRESSURE SUPPORT)
PREFILLED_SYRINGE | INTRAVENOUS | Status: DC | PRN
  Administered 2015-11-18 (×4): 80 ug via INTRAVENOUS

## 2015-11-18 MED ORDER — HYDROMORPHONE HCL 1 MG/ML IJ SOLN
INTRAMUSCULAR | Status: AC
  Filled 2015-11-18: qty 1

## 2015-11-18 MED ORDER — CEFAZOLIN SODIUM-DEXTROSE 2-4 GM/100ML-% IV SOLN
INTRAVENOUS | Status: AC
  Filled 2015-11-18: qty 100

## 2015-11-18 MED ORDER — DEXAMETHASONE SODIUM PHOSPHATE 10 MG/ML IJ SOLN
10.0000 mg | Freq: Once | INTRAMUSCULAR | Status: AC
  Administered 2015-11-18: 10 mg via INTRAVENOUS

## 2015-11-18 MED ORDER — ACETAMINOPHEN 650 MG RE SUPP: 650.0000 mg | Freq: Four times a day (QID) | RECTAL | Status: DC | PRN

## 2015-11-18 MED ORDER — POLYETHYLENE GLYCOL 3350 17 G PO PACK: 17.0000 g | PACK | Freq: Every day | ORAL | Status: DC | PRN

## 2015-11-18 MED ORDER — PHENOL 1.4 % MT LIQD: 1.0000 | OROMUCOSAL | Status: DC | PRN

## 2015-11-18 MED ORDER — METHOCARBAMOL 1000 MG/10ML IJ SOLN
500.0000 mg | Freq: Four times a day (QID) | INTRAVENOUS | Status: DC | PRN
  Administered 2015-11-18: 500 mg via INTRAVENOUS
  Filled 2015-11-18: qty 5
  Filled 2015-11-18: qty 550
  Filled 2015-11-18: qty 5

## 2015-11-18 MED ORDER — HYDROMORPHONE HCL 1 MG/ML IJ SOLN: 0.5000 mg | INTRAMUSCULAR | Status: DC | PRN

## 2015-11-18 MED ORDER — ONDANSETRON HCL 4 MG/2ML IJ SOLN: 4.0000 mg | Freq: Four times a day (QID) | INTRAMUSCULAR | Status: DC | PRN

## 2015-11-18 MED ORDER — ASPIRIN EC 325 MG PO TBEC
325.0000 mg | DELAYED_RELEASE_TABLET | Freq: Every day | ORAL | Status: DC
  Administered 2015-11-19 – 2015-11-20 (×2): 325 mg via ORAL
  Filled 2015-11-18 (×2): qty 1

## 2015-11-18 MED ORDER — DIPHENHYDRAMINE HCL 12.5 MG/5ML PO ELIX: 12.5000 mg | ORAL_SOLUTION | ORAL | Status: DC | PRN

## 2015-11-18 MED ORDER — CEFAZOLIN SODIUM-DEXTROSE 2-4 GM/100ML-% IV SOLN
2.0000 g | INTRAVENOUS | Status: AC
  Administered 2015-11-18: 2 g via INTRAVENOUS
  Filled 2015-11-18: qty 100

## 2015-11-18 MED ORDER — PROPOFOL 10 MG/ML IV BOLUS
INTRAVENOUS | Status: DC | PRN
  Administered 2015-11-18: 40 mg via INTRAVENOUS
  Administered 2015-11-18: 200 mg via INTRAVENOUS

## 2015-11-18 MED ORDER — TRAMADOL HCL 50 MG PO TABS
50.0000 mg | ORAL_TABLET | Freq: Four times a day (QID) | ORAL | Status: DC | PRN
  Administered 2015-11-18 (×2): 50 mg via ORAL
  Administered 2015-11-19: 100 mg via ORAL
  Administered 2015-11-19 (×3): 50 mg via ORAL
  Administered 2015-11-20: 100 mg via ORAL
  Filled 2015-11-18 (×3): qty 1
  Filled 2015-11-18 (×2): qty 2
  Filled 2015-11-18 (×2): qty 1
  Filled 2015-11-18: qty 2

## 2015-11-18 MED ORDER — POLYVINYL ALCOHOL-POVIDONE 1.4-0.6 % OP SOLN: 1.0000 [drp] | Freq: Every day | OPHTHALMIC | Status: DC

## 2015-11-18 MED ORDER — SODIUM CHLORIDE 0.9 % IJ SOLN
INTRAMUSCULAR | Status: AC
  Filled 2015-11-18: qty 50

## 2015-11-18 MED ORDER — MIDAZOLAM HCL 5 MG/5ML IJ SOLN
INTRAMUSCULAR | Status: DC | PRN
  Administered 2015-11-18: 2 mg via INTRAVENOUS

## 2015-11-18 MED ORDER — SODIUM CHLORIDE 0.9 % IV SOLN
INTRAVENOUS | Status: DC
  Administered 2015-11-18: 16:00:00 via INTRAVENOUS

## 2015-11-18 MED ORDER — LACTATED RINGERS IV SOLN
INTRAVENOUS | Status: DC
  Administered 2015-11-18 (×3): via INTRAVENOUS

## 2015-11-18 MED ORDER — BUPIVACAINE LIPOSOME 1.3 % IJ SUSP
20.0000 mL | Freq: Once | INTRAMUSCULAR | Status: DC
  Filled 2015-11-18: qty 20

## 2015-11-18 MED ORDER — ACETAMINOPHEN 325 MG PO TABS: 650.0000 mg | ORAL_TABLET | Freq: Four times a day (QID) | ORAL | Status: DC | PRN

## 2015-11-18 MED ORDER — ONDANSETRON HCL 4 MG/2ML IJ SOLN
INTRAMUSCULAR | Status: AC
  Filled 2015-11-18: qty 2

## 2015-11-18 MED ORDER — PROPOFOL 10 MG/ML IV BOLUS
INTRAVENOUS | Status: AC
  Filled 2015-11-18: qty 40

## 2015-11-18 MED ORDER — CHLORHEXIDINE GLUCONATE 4 % EX LIQD: 60.0000 mL | Freq: Once | CUTANEOUS | Status: DC

## 2015-11-18 MED ORDER — METOCLOPRAMIDE HCL 5 MG/ML IJ SOLN
5.0000 mg | Freq: Three times a day (TID) | INTRAMUSCULAR | Status: DC | PRN
  Administered 2015-11-18: 10 mg via INTRAVENOUS
  Filled 2015-11-18: qty 2

## 2015-11-18 MED ORDER — TRANEXAMIC ACID 1000 MG/10ML IV SOLN
1000.0000 mg | INTRAVENOUS | Status: AC
  Administered 2015-11-18: 1000 mg via INTRAVENOUS
  Filled 2015-11-18: qty 10

## 2015-11-18 MED ORDER — ACETAMINOPHEN 10 MG/ML IV SOLN
1000.0000 mg | Freq: Once | INTRAVENOUS | Status: AC
  Administered 2015-11-18: 1000 mg via INTRAVENOUS
  Filled 2015-11-18: qty 100

## 2015-11-18 MED ORDER — ONDANSETRON HCL 4 MG/2ML IJ SOLN
INTRAMUSCULAR | Status: DC | PRN
  Administered 2015-11-18: 4 mg via INTRAVENOUS

## 2015-11-18 MED ORDER — BUPIVACAINE LIPOSOME 1.3 % IJ SUSP
INTRAMUSCULAR | Status: DC | PRN
  Administered 2015-11-18: 20 mL

## 2015-11-18 MED ORDER — CEFAZOLIN SODIUM-DEXTROSE 2-4 GM/100ML-% IV SOLN
2.0000 g | Freq: Four times a day (QID) | INTRAVENOUS | Status: AC
  Administered 2015-11-18 (×2): 2 g via INTRAVENOUS
  Filled 2015-11-18: qty 100

## 2015-11-18 MED ORDER — DOCUSATE SODIUM 100 MG PO CAPS
100.0000 mg | ORAL_CAPSULE | Freq: Two times a day (BID) | ORAL | Status: DC
  Administered 2015-11-18 – 2015-11-20 (×4): 100 mg via ORAL
  Filled 2015-11-18 (×4): qty 1

## 2015-11-18 MED ORDER — BISACODYL 10 MG RE SUPP: 10.0000 mg | Freq: Every day | RECTAL | Status: DC | PRN

## 2015-11-18 MED ORDER — MENTHOL 3 MG MT LOZG: 1.0000 | LOZENGE | OROMUCOSAL | Status: DC | PRN

## 2015-11-18 MED ORDER — BUPIVACAINE HCL (PF) 0.25 % IJ SOLN
INTRAMUSCULAR | Status: AC
  Filled 2015-11-18: qty 30

## 2015-11-18 MED ORDER — FLEET ENEMA 7-19 GM/118ML RE ENEM: 1.0000 | ENEMA | Freq: Once | RECTAL | Status: DC | PRN

## 2015-11-18 MED ORDER — DEXAMETHASONE SODIUM PHOSPHATE 10 MG/ML IJ SOLN
INTRAMUSCULAR | Status: AC
  Filled 2015-11-18: qty 1

## 2015-11-18 MED ORDER — BUPIVACAINE HCL 0.25 % IJ SOLN
INTRAMUSCULAR | Status: DC | PRN
  Administered 2015-11-18: 20 mL

## 2015-11-18 MED ORDER — MIDAZOLAM HCL 2 MG/2ML IJ SOLN
INTRAMUSCULAR | Status: AC
  Filled 2015-11-18: qty 2

## 2015-11-18 MED ORDER — TRANEXAMIC ACID 1000 MG/10ML IV SOLN
1000.0000 mg | Freq: Once | INTRAVENOUS | Status: AC
  Administered 2015-11-18: 1000 mg via INTRAVENOUS
  Filled 2015-11-18 (×2): qty 10

## 2015-11-18 MED ORDER — FENTANYL CITRATE (PF) 100 MCG/2ML IJ SOLN
INTRAMUSCULAR | Status: AC
  Filled 2015-11-18: qty 2

## 2015-11-18 SURGICAL SUPPLY — 55 items
BAG DECANTER FOR FLEXI CONT (MISCELLANEOUS) ×3 IMPLANT
BAG ZIPLOCK 12X15 (MISCELLANEOUS) ×3 IMPLANT
BANDAGE ACE 6X5 VEL STRL LF (GAUZE/BANDAGES/DRESSINGS) ×3 IMPLANT
BANDAGE ADH SHEER 1  50/CT (GAUZE/BANDAGES/DRESSINGS) IMPLANT
BLADE SAG 18X100X1.27 (BLADE) ×3 IMPLANT
BLADE SAW SGTL 11.0X1.19X90.0M (BLADE) ×3 IMPLANT
BOWL SMART MIX CTS (DISPOSABLE) ×3 IMPLANT
CAP KNEE TOTAL 3 SIGMA ×3 IMPLANT
CEMENT HV SMART SET (Cement) ×6 IMPLANT
CLOTH BEACON ORANGE TIMEOUT ST (SAFETY) ×3 IMPLANT
CUFF TOURN SGL QUICK 34 (TOURNIQUET CUFF) ×1
CUFF TRNQT CYL 34X4X40X1 (TOURNIQUET CUFF) ×2 IMPLANT
DECANTER SPIKE VIAL GLASS SM (MISCELLANEOUS) ×3 IMPLANT
DRAPE U-SHAPE 47X51 STRL (DRAPES) ×3 IMPLANT
DRSG ADAPTIC 3X8 NADH LF (GAUZE/BANDAGES/DRESSINGS) ×3 IMPLANT
DRSG PAD ABDOMINAL 8X10 ST (GAUZE/BANDAGES/DRESSINGS) ×3 IMPLANT
DURAPREP 26ML APPLICATOR (WOUND CARE) ×3 IMPLANT
ELECT REM PT RETURN 9FT ADLT (ELECTROSURGICAL) ×3
ELECTRODE REM PT RTRN 9FT ADLT (ELECTROSURGICAL) ×2 IMPLANT
EVACUATOR 1/8 PVC DRAIN (DRAIN) ×3 IMPLANT
GAUZE SPONGE 4X4 12PLY STRL (GAUZE/BANDAGES/DRESSINGS) ×3 IMPLANT
GLOVE BIO SURGEON STRL SZ7.5 (GLOVE) IMPLANT
GLOVE BIO SURGEON STRL SZ8 (GLOVE) ×3 IMPLANT
GLOVE BIOGEL PI IND STRL 6.5 (GLOVE) ×16 IMPLANT
GLOVE BIOGEL PI IND STRL 8 (GLOVE) ×2 IMPLANT
GLOVE BIOGEL PI INDICATOR 6.5 (GLOVE) ×8
GLOVE BIOGEL PI INDICATOR 8 (GLOVE) ×1
GLOVE SURG SS PI 6.5 STRL IVOR (GLOVE) IMPLANT
GOWN STRL REUS W/TWL LRG LVL3 (GOWN DISPOSABLE) ×9 IMPLANT
GOWN STRL REUS W/TWL XL LVL3 (GOWN DISPOSABLE) ×3 IMPLANT
HANDPIECE INTERPULSE COAX TIP (DISPOSABLE) ×1
IMMOBILIZER KNEE 20 (SOFTGOODS) ×3
IMMOBILIZER KNEE 20 THIGH 36 (SOFTGOODS) ×2 IMPLANT
MANIFOLD NEPTUNE II (INSTRUMENTS) ×3 IMPLANT
NDL SAFETY ECLIPSE 18X1.5 (NEEDLE) IMPLANT
NEEDLE HYPO 18GX1.5 SHARP (NEEDLE)
NS IRRIG 1000ML POUR BTL (IV SOLUTION) ×3 IMPLANT
PACK TOTAL KNEE CUSTOM (KITS) ×3 IMPLANT
PAD ABD 8X10 STRL (GAUZE/BANDAGES/DRESSINGS) ×3 IMPLANT
PADDING CAST COTTON 6X4 STRL (CAST SUPPLIES) ×3 IMPLANT
POSITIONER SURGICAL ARM (MISCELLANEOUS) ×3 IMPLANT
SET HNDPC FAN SPRY TIP SCT (DISPOSABLE) ×2 IMPLANT
STRIP CLOSURE SKIN 1/2X4 (GAUZE/BANDAGES/DRESSINGS) ×3 IMPLANT
SUT MNCRL AB 4-0 PS2 18 (SUTURE) ×3 IMPLANT
SUT VIC AB 2-0 CT1 27 (SUTURE) ×3
SUT VIC AB 2-0 CT1 TAPERPNT 27 (SUTURE) ×6 IMPLANT
SUT VLOC 180 0 24IN GS25 (SUTURE) ×3 IMPLANT
SWABSTICK PVP IODINE (MISCELLANEOUS) ×3 IMPLANT
SYR 50ML LL SCALE MARK (SYRINGE) ×3 IMPLANT
SYR CONTROL 10ML LL (SYRINGE) IMPLANT
TRAY FOLEY W/METER SILVER 14FR (SET/KITS/TRAYS/PACK) ×3 IMPLANT
TRAY FOLEY W/METER SILVER 16FR (SET/KITS/TRAYS/PACK) IMPLANT
WATER STERILE IRR 1500ML POUR (IV SOLUTION) ×3 IMPLANT
WRAP KNEE MAXI GEL POST OP (GAUZE/BANDAGES/DRESSINGS) ×3 IMPLANT
YANKAUER SUCT BULB TIP 10FT TU (MISCELLANEOUS) ×3 IMPLANT

## 2015-11-18 NOTE — H&P (View-Only) (Signed)
Lauren English DOB: 02/18/1941 Married / Language: Lenox Ponds[place] / Race: White Female Date of Admission:  11/18/2015 CC:  Right Knee Pain History of Present Illness  The patient is a 75 year old female who comes in for a preoperative History and Physical. The patient is scheduled for a right total knee arthroplasty to be performed by Dr. Gus RankinFrank V. Aluisio, MD at [facility] on 11/18/2015. She is status post left total knee replacement. The left knee continues to improve. The right knee is increasingly problematic. She is at a stage where she is ready to just go ahead and get scheduled for the right knee replacement. The patient is a 75 year old female who presented for follow up of their knee. The patient has been followed for their bilateral knee pain and osteoarthritis. Symptoms reported include: pain, aching, instability and difficulty ambulating. The patient feels that they are doing poorly and report their pain level to be moderate. She has also had increased pain in the right knee recently. She states that the left knee is doing better but continues to have increasing difficulty with the right one now. She had documented advanced arthritis in both knees but has had the left knee repalced and now presents for the right knee replacement at this time. They have been treated conservatively in the past for the above stated problem and despite conservative measures, they continue to have progressive pain and severe functional limitations and dysfunction. They have failed non-operative management including home exercise, medications, and injections. It is felt that they would benefit from undergoing total joint replacement. Risks and benefits of the procedure have been discussed with the patient and they elect to proceed with surgery. There are no active contraindications to surgery such as ongoing infection or rapidly progressive neurological disease.  Problem List/Past Medical  Status post total  left knee replacement (W09.811(Z96.652)  Encounter for care following Quadriceps Tendon Repair (Z47.89)  Peripheral Neuropathy  Sleep Apnea  uses CPAP Primary localized osteoarthritis of left knee (M17.12)  Tenosynovitis, radial styloid (727.04) [11/18/2006]: Plantar fasciitis (M72.2) [12/15/2006]: Lumbago (M54.5) [02/22/2007]: Symptom, disturbance of skin sensation (782.0) [02/22/2007]: Osteoarthrosis NOS, lower leg (715.96) [12/21/2008]: Dislocation, patella, closed (836.3) [12/21/2008]: Tear, lateral meniscus, knee, current (836.1) [04/29/2009]: Pain in joint, ankle and foot (M25.579) [04/29/2009]: Chondromalacia, 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 Patient IS ABLE to take Hydrocodone  Family History Cancer  Mother. Osteoarthritis  Father, Mother.  Social History  Exercise  Exercises rarely; does running / walking and gym / weights Current drinker  06/16/2013: Currently drinks wine less than 5 times per week Children  3 Living situation  live with spouse Current work status  retired Number of flights of stairs before winded  2-3 Tobacco / smoke exposure  06/16/2013: no Tobacco use  Never smoker. 06/16/2013 Marital status  married No history of drug/alcohol rehab  Not under pain contract  Advance Directives  Healthcare POA Post-Surgical Plans  HOME  Medication History Myrbetriq (50MG  Tablet ER 24HR, Oral) Active. Iron (Oral) Specific strength unknown - Active. Aspirin (81MG  Tablet DR, Oral) Active. Acetyl L-Carnitine (Oral) Specific strength unknown - Active. Calcium Carbonate (600MG  Tablet, 2 Oral daily) Active. Vitamin D (400UNIT Tablet, 2 Oral daily) Active. Omega 3 (Oral) Specific strength unknown - Active. Turmeric (Oral) Specific strength unknown - Active. Glucosamine  Complex (Oral) Active. Ibuprofen (200MG  Tablet, 4 Oral at bedtime)  Active.  Past Surgical History Spinal Fusion  Date: 04/2012. Lam/Fusion - Dr. Lovell Sheehan Spinal Surgery  Tonsillectomy  Date: 1948. Adnoids Neck Disc Surgery  Date: 07/2005. Cervical Disc Fusion Arthroscopy of Knee  left Breast Biopsy  bilateral Hysterectomy  Date: 01/1979. partial (non-cancerous) Tubal Ligation  Total Knee Replacement - Left  Left Knee Surgery  Date: 08/2008. Bladder Tack  Date: 08/1989.    Review of Systems  General Not Present- Chills, Fatigue, Fever, Memory Loss, Night Sweats, Weight Gain and Weight Loss. Skin Not Present- Eczema, Hives, Itching, Lesions and Rash. HEENT Not Present- Dentures, Double Vision, Headache, Hearing Loss, Tinnitus and Visual Loss. Respiratory Not Present- Allergies, Chronic Cough, Coughing up blood, Shortness of breath at rest and Shortness of breath with exertion. Cardiovascular Not Present- Chest Pain, Difficulty Breathing Lying Down, Murmur, Palpitations, Racing/skipping heartbeats and Swelling. Gastrointestinal Not Present- Abdominal Pain, Bloody Stool, Constipation, Diarrhea, Difficulty Swallowing, Heartburn, Jaundice, Loss of appetitie, Nausea and Vomiting. Female Genitourinary Not Present- Blood in Urine, Discharge, Flank Pain, Incontinence, Painful Urination, Urgency, Urinary frequency, Urinary Retention, Urinating at Night and Weak urinary stream. Musculoskeletal Present- Joint Pain. Not Present- Back Pain, Joint Swelling, Morning Stiffness, Muscle Pain, Muscle Weakness and Spasms. Neurological Not Present- Blackout spells, Difficulty with balance, Dizziness, Paralysis, Tremor and Weakness. Psychiatric Not Present- Insomnia.  Vitals Weight: 189 lb Height: 68in Body Surface Area: 2 m Body Mass Index: 28.74 kg/m  Pulse: 68 (Regular)  BP: 124/70 (Sitting, Right Arm, Standard)   Physical Exam General Mental Status -Alert, cooperative and good  historian. General Appearance-pleasant, Not in acute distress. Orientation-Oriented X3. Build & Nutrition-Well nourished and Well developed.  Head and Neck Head-normocephalic, atraumatic . Neck Global Assessment - supple, no bruit auscultated on the right, no bruit auscultated on the left.  Eye Vision-Wears contact lenses. Pupil - Bilateral-Regular and Round. Motion - Bilateral-EOMI.  Chest and Lung Exam Auscultation Breath sounds - clear at anterior chest wall and clear at posterior chest wall. Adventitious sounds - No Adventitious sounds.  Cardiovascular Auscultation Rhythm - Regular rate and rhythm. Heart Sounds - S1 WNL and S2 WNL. Murmurs & Other Heart Sounds - Auscultation of the heart reveals - No Murmurs.  Abdomen Palpation/Percussion Tenderness - Abdomen is non-tender to palpation. Rigidity (guarding) - Abdomen is soft. Auscultation Auscultation of the abdomen reveals - Bowel sounds normal.  Female Genitourinary Note: Not done, not pertinent to present illness   Musculoskeletal Note: Right knee - no effusion, range 5-120, marked crepitus on range of motion; tenderness, medial greater than lateral, with no instability. Pulses, sensation, and motor are intact, both lower extremities.   Assessment & Plan  Primary osteoarthritis of right knee (M17.11) Status post total left knee replacement (Z61.096)  Note:Surgical Plans: Right Total Knee Replacement  Disposition: Home  PCP: Dr. Paulino Rily - Patient has been seen preoperatively and felt to be stable for surgery.  IV TXA  Anesthesia Issues: Vocal cord issues following the last surgery.  [facility] Study Patient Virtual Therapy - No Home Health  Signed electronically by Beckey Rutter, III PA-C

## 2015-11-18 NOTE — Op Note (Signed)
Pre-operative diagnosis- Osteoarthritis  Right knee(s)     Arthrofibrosis Left knee  Post-operative diagnosis- Osteoarthritis Right knee(s)     Arthrofibrosis Left Knee  Procedure-  Right  Total Knee Arthroplasty   Left knee closed manipulation  Surgeon- Lauren RankinFrank V. Arlo Buffone, MD  Assistant- Dimitri PedAmber Constable PA-C   Anesthesia-  Spinal  EBL-* No blood loss amount entered *   Drains Hemovac  Tourniquet time-35 minutes @ 300 mm Hg  Complications- None  Condition-PACU - hemodynamically stable.   Brief Clinical Note  Lauren English is a 75 y.o. year old female with end stage OA of her right knee with progressively worsening pain and dysfunction. She has constant pain, with activity and at rest and significant functional deficits with difficulties even with ADLs. She has had extensive non-op management including analgesics, injections of cortisone and viscosupplements, and home exercise program, but remains in significant pain with significant dysfunction.Radiographs show bone on bone arthritis medial and patellofemoral. She presents now for right Total Knee Arthroplasty.  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 After successful administration of  Spinal,   a tourniquet is placed high on the  Right thigh(s) and the lower extremity is prepped and draped in the usual sterile fashion. Time out is performed by the operating team and then the  Right lower extremity is wrapped in Esmarch, knee flexed and the tourniquet inflated to 300 mmHg.       A midline incision is made with a ten blade through the subcutaneous tissue to the level of the extensor mechanism. A fresh blade is used to make a medial parapatellar arthrotomy. Soft tissue over the proximal medial tibia is subperiosteally elevated to the joint line with a knife and into the  semimembranosus bursa with a Cobb elevator. Soft tissue over the proximal lateral tibia is elevated with attention being paid to avoiding the patellar tendon on the tibial tubercle. The patella is everted, knee flexed 90 degrees and the ACL and PCL are removed. Findings are bone on bone medial and patellofemoral with large global osteophytes.        The drill is used to create a starting hole in the distal femur and the canal is thoroughly irrigated with sterile saline to remove the fatty contents. The 5 degree Right  valgus alignment guide is placed into the femoral canal and the distal femoral cutting block is pinned to remove 10 mm off the distal femur. Resection is made with an oscillating saw.      The tibia is subluxed forward and the menisci are removed. The extramedullary alignment guide is placed referencing proximally at the medial aspect of the tibial tubercle and distally along the second metatarsal axis and tibial crest. The block is pinned to remove 2mm off the more deficient medial  side. Resection is made with an oscillating saw. Size 4is the most appropriate size for the tibia and the proximal tibia is prepared with the modular drill and keel punch for that size.      The femoral sizing guide is placed and size 4 is most appropriate. Rotation is marked off the epicondylar axis and confirmed by creating a rectangular flexion gap at 90 degrees. The size 4 cutting block is pinned in this rotation and the anterior, posterior and chamfer cuts are made with the oscillating saw. The intercondylar block  is then placed and that cut is made.      Trial size 4 tibial component, trial size 4 posterior stabilized femur and a 12.5  mm posterior stabilized rotating platform insert trial is placed. Full extension is achieved with excellent varus/valgus and anterior/posterior balance throughout full range of motion. The patella is everted and thickness measured to be 24  mm. Free hand resection is taken to 14  mm, a 38 template is placed, lug holes are drilled, trial patella is placed, and it tracks normally. Osteophytes are removed off the posterior femur with the trial in place. All trials are removed and the cut bone surfaces prepared with pulsatile lavage. Cement is mixed and once ready for implantation, the size 4 tibial implant, size  4 posterior stabilized femoral component, and the size 38 patella are cemented in place and the patella is held with the clamp. The trial insert is placed and the knee held in full extension. The Exparel (20 ml mixed with 30 ml saline) and .25% Bupivicaine, are injected into the extensor mechanism, posterior capsule, medial and lateral gutters and subcutaneous tissues.  All extruded cement is removed and once the cement is hard the permanent 12.5 mm posterior stabilized rotating platform insert is placed into the tibial tray.      The wound is copiously irrigated with saline solution and the extensor mechanism closed over a hemovac drain with #1 V-loc suture. The tourniquet is released for a total tourniquet time of 35  minutes. Flexion against gravity is 140 degrees and the patella tracks normally. Subcutaneous tissue is closed with 2.0 vicryl and subcuticular with running 4.0 Monocryl. The incision is cleaned and dried and steri-strips and a bulky sterile dressing are applied. The limb is placed into a knee immobilizer.       The left leg is then addressed. Range of motion is 5-80 degrees. My chest is then placed against her proximal tibia and the knee is flexed. There was significant resistance to attempted flexion. I was able to eventually et her flexed to 95 degrees. The patient is then awakened and transported to recovery in stable condition.      Please note that a surgical assistant was a medical necessity for this procedure in order to perform it in a safe and expeditious manner. Surgical assistant was necessary to retract the ligaments and vital neurovascular structures to  prevent injury to them and also necessary for proper positioning of the limb to allow for anatomic placement of the prosthesis.   Lauren Rankin Jamella Grayer, MD    11/18/2015, 12:19 PM

## 2015-11-18 NOTE — Anesthesia Procedure Notes (Signed)
Spinal Patient location during procedure: OR Start time: 11/18/2015 11:00 AM End time: 11/18/2015 11:15 AM Staffing Resident/CRNA: Jiah Bari Performed by: resident/CRNA  Preanesthetic Checklist Completed: patient identified, site marked, surgical consent, pre-op evaluation, timeout performed, IV checked, risks and benefits discussed and monitors and equipment checked Spinal Block Patient position: sitting Prep: Betadine Patient monitoring: heart rate, cardiac monitor, continuous pulse ox and blood pressure Approach: right paramedian Location: L3-4 Injection technique: single-shot Needle Needle type: Spinocan  Needle gauge: 22 G Needle length: 9 cm Needle insertion depth: 7 cm Assessment Sensory level: T6 Additional Notes -heme, -para, VSS.  Expiration OK.

## 2015-11-18 NOTE — Transfer of Care (Signed)
Immediate Anesthesia Transfer of Care Note  Patient: Lauren English  Procedure(s) Performed: Procedure(s): RIGHT TOTAL KNEE ARTHROPLASTY (Right) LEFT KNEE CLOSED MANIPULATION (Left)  Patient Location: PACU  Anesthesia Type:General and Spinal  Level of Consciousness:  sedated, patient cooperative and responds to stimulation  Airway & Oxygen Therapy:Patient Spontanous Breathing and Patient connected to face mask oxgen  Post-op Assessment:  Report given to PACU RN and Post -op Vital signs reviewed and stable  Post vital signs:  Reviewed and stable  Last Vitals:  Filed Vitals:   11/18/15 0830  BP: 125/59  Pulse: 72  Temp: 36.6 C  Resp: 18    Complications: No apparent anesthesia complications

## 2015-11-18 NOTE — Interval H&P Note (Signed)
History and Physical Interval Note:  11/18/2015 10:26 AM  Lauren English  has presented today for surgery, with the diagnosis of RIGHT KNEE OA, ARTHROFIBROSIS LEFT KNEE  The various methods of treatment have been discussed with the patient and family. After consideration of risks, benefits and other options for treatment, the patient has consented to  Procedure(s): RIGHT TOTAL KNEE ARTHROPLASTY (Right) LEFT KNEE CLOSED MANIPULATION (Left) as a surgical intervention .  The patient's history has been reviewed, patient examined, no change in status, stable for surgery.  I have reviewed the patient's chart and labs.  Questions were answered to the patient's satisfaction.     Loanne DrillingALUISIO,Thresia Ramanathan V

## 2015-11-18 NOTE — Anesthesia Postprocedure Evaluation (Signed)
Anesthesia Post Note  Patient: Fonda KinderJanet G Larmon  Procedure(s) Performed: Procedure(s) (LRB): RIGHT TOTAL KNEE ARTHROPLASTY (Right) LEFT KNEE CLOSED MANIPULATION (Left)  Patient location during evaluation: PACU Anesthesia Type: General and Spinal Level of consciousness: awake and alert Pain management: pain level controlled Vital Signs Assessment: post-procedure vital signs reviewed and stable Respiratory status: spontaneous breathing, nonlabored ventilation, respiratory function stable and patient connected to nasal cannula oxygen Cardiovascular status: blood pressure returned to baseline and stable Postop Assessment: no signs of nausea or vomiting Anesthetic complications: no    Last Vitals:  Filed Vitals:   11/18/15 1307 11/18/15 1315  BP:  128/68  Pulse: 56 56  Temp:    Resp: 13 10    Last Pain:  Filed Vitals:   11/18/15 1319  PainSc: 6                  Neco Kling S

## 2015-11-18 NOTE — Anesthesia Preprocedure Evaluation (Signed)
Anesthesia Evaluation  Patient identified by MRN, date of birth, ID band Patient awake    Reviewed: Allergy & Precautions, NPO status , Patient's Chart, lab work & pertinent test results  Airway Mallampati: III  TM Distance: <3 FB Neck ROM: Full    Dental no notable dental hx.    Pulmonary sleep apnea and Continuous Positive Airway Pressure Ventilation ,    Pulmonary exam normal breath sounds clear to auscultation       Cardiovascular negative cardio ROS Normal cardiovascular exam Rhythm:Regular Rate:Normal     Neuro/Psych negative neurological ROS  negative psych ROS   GI/Hepatic negative GI ROS, Neg liver ROS,   Endo/Other  negative endocrine ROS  Renal/GU negative Renal ROS  negative genitourinary   Musculoskeletal negative musculoskeletal ROS (+)   Abdominal   Peds negative pediatric ROS (+)  Hematology negative hematology ROS (+)   Anesthesia Other Findings   Reproductive/Obstetrics negative OB ROS                             Anesthesia Physical Anesthesia Plan  ASA: II  Anesthesia Plan: Spinal   Post-op Pain Management:    Induction: Intravenous  Airway Management Planned: Simple Face Mask  Additional Equipment:   Intra-op Plan:   Post-operative Plan:   Informed Consent: I have reviewed the patients History and Physical, chart, labs and discussed the procedure including the risks, benefits and alternatives for the proposed anesthesia with the patient or authorized representative who has indicated his/her understanding and acceptance.   Dental advisory given  Plan Discussed with: CRNA and Surgeon  Anesthesia Plan Comments:         Anesthesia Quick Evaluation

## 2015-11-19 LAB — CBC
HEMATOCRIT: 33.7 % — AB (ref 36.0–46.0)
HEMOGLOBIN: 10.9 g/dL — AB (ref 12.0–15.0)
MCH: 27.4 pg (ref 26.0–34.0)
MCHC: 32.3 g/dL (ref 30.0–36.0)
MCV: 84.7 fL (ref 78.0–100.0)
PLATELETS: 132 10*3/uL — AB (ref 150–400)
RBC: 3.98 MIL/uL (ref 3.87–5.11)
RDW: 13.9 % (ref 11.5–15.5)
WBC: 7.1 10*3/uL (ref 4.0–10.5)

## 2015-11-19 LAB — BASIC METABOLIC PANEL
Anion gap: 4 — ABNORMAL LOW (ref 5–15)
BUN: 14 mg/dL (ref 6–20)
CALCIUM: 8.4 mg/dL — AB (ref 8.9–10.3)
CO2: 27 mmol/L (ref 22–32)
Chloride: 106 mmol/L (ref 101–111)
Creatinine, Ser: 0.69 mg/dL (ref 0.44–1.00)
GFR calc Af Amer: >60 mL/min (ref >60)
GLUCOSE: 107 mg/dL — AB (ref 65–99)
Potassium: 4.1 mmol/L (ref 3.5–5.1)
Sodium: 137 mmol/L (ref 135–145)

## 2015-11-19 MED ORDER — ASPIRIN 325 MG PO TBEC: 325.0000 mg | DELAYED_RELEASE_TABLET | Freq: Every day | ORAL | Status: DC

## 2015-11-19 MED ORDER — METHOCARBAMOL 500 MG PO TABS: 500.0000 mg | ORAL_TABLET | Freq: Four times a day (QID) | ORAL | Status: DC | PRN

## 2015-11-19 MED ORDER — HYDROMORPHONE HCL 2 MG PO TABS
2.0000 mg | ORAL_TABLET | ORAL | Status: DC | PRN
Start: 2015-11-19 — End: 2017-01-26

## 2015-11-19 MED ORDER — TRAMADOL HCL 50 MG PO TABS: 50.0000 mg | ORAL_TABLET | Freq: Four times a day (QID) | ORAL | Status: DC | PRN

## 2015-11-19 NOTE — Progress Notes (Signed)
   Subjective: 1 Day Post-Op Procedure(s) (LRB): RIGHT TOTAL KNEE ARTHROPLASTY (Right) LEFT KNEE CLOSED MANIPULATION (Left) Patient reports pain as mild.   Patient seen in rounds with Dr. Lequita HaltAluisio. Patient is well, but has had some minor complaints of pain in the knee, requiring pain medications.  Biggest complaint is spasms. We will start therapy today.  Plan is to go Home after hospital stay.  Objective: Vital signs in last 24 hours: Temp:  [97.3 F (36.3 C)-97.8 F (36.6 C)] 97.5 F (36.4 C) (07/11 0457) Pulse Rate:  [49-69] 49 (07/11 0457) Resp:  [6-18] 16 (07/11 0457) BP: (111-140)/(50-73) 119/55 mmHg (07/11 0457) SpO2:  [96 %-100 %] 96 % (07/11 0457) Weight:  [87.176 kg (192 lb 3 oz)] 87.176 kg (192 lb 3 oz) (07/10 0915)  Intake/Output from previous day:  Intake/Output Summary (Last 24 hours) at 11/19/15 0901 Last data filed at 11/19/15 0645  Gross per 24 hour  Intake 4016.5 ml  Output   2660 ml  Net 1356.5 ml    Intake/Output this shift: UOP 890  Labs:  Recent Labs  11/19/15 0358  HGB 10.9*    Recent Labs  11/19/15 0358  WBC 7.1  RBC 3.98  HCT 33.7*  PLT 132*    Recent Labs  11/19/15 0358  NA 137  K 4.1  CL 106  CO2 27  BUN 14  CREATININE 0.69  GLUCOSE 107*  CALCIUM 8.4*   No results for input(s): LABPT, INR in the last 72 hours.  EXAM General - Patient is Alert, Appropriate and Oriented Extremity - Neurovascular intact Sensation intact distally Dorsiflexion/Plantar flexion intact Dressing - dressing C/D/I Motor Function - intact, moving foot and toes well on exam.  Hemovac pulled without difficulty.  Past Medical History  Diagnosis Date  . PONV (postoperative nausea and vomiting)   . Bladder incontinence   . OSA on CPAP   . Arthritis     "qwhere; feet, knees, neck" (04/21/2012)  . Chronic back pain   . Closed angle glaucoma     "both eyes" (04/21/2012)  . Prediabetes   . Urinary tract bacterial infections   . Overactive  bladder   . Numbness     fingertips bilat and feet bilat   . Peripheral neuropathy (HCC)     feet bilat   . Difficult intubation     with intubation on November 26, 2014  . Pneumonia     hx. of as child  . Anemia     Assessment/Plan: 1 Day Post-Op Procedure(s) (LRB): RIGHT TOTAL KNEE ARTHROPLASTY (Right) LEFT KNEE CLOSED MANIPULATION (Left) Active Problems:   OA (osteoarthritis) of knee  Estimated body mass index is 29.23 kg/(m^2) as calculated from the following:   Height as of this encounter: 5\' 8"  (1.727 m).   Weight as of this encounter: 87.176 kg (192 lb 3 oz). Advance diet Up with therapy Plan for discharge tomorrow Discharge home - No Home Health PT Virtual Therapy at home.  DVT Prophylaxis - Aspirin 325  Weight-Bearing as tolerated to both legs D/C O2 and Pulse OX and try on Room Air  Avel Peacerew Euretha Najarro, PA-C Orthopaedic Surgery 11/19/2015, 9:01 AM

## 2015-11-19 NOTE — Progress Notes (Signed)
Physical Therapy Treatment Note    11/19/15 1300  PT Visit Information  Last PT Received On 11/19/15  Assistance Needed +1  History of Present Illness Pt is a 75 year old female s/p R TKA and L knee manipulation  Subjective Data  Subjective Pt ambulated again in hallway and assisted back to bed.  Precautions  Precautions Knee;Fall  Precaution Comments able to perform SLR  Restrictions  Other Position/Activity Restrictions WBAT  Pain Assessment  Pain Assessment 0-10  Pain Score 6  Pain Location R knee  Pain Descriptors / Indicators Aching;Sore  Pain Intervention(s) Monitored during session;Limited activity within patient's tolerance;Patient requesting pain meds-RN notified  Cognition  Arousal/Alertness Awake/alert  Behavior During Therapy WFL for tasks assessed/performed  Overall Cognitive Status Within Functional Limits for tasks assessed  Bed Mobility  Overal bed mobility Needs Assistance  Bed Mobility Supine to Sit;Sit to Supine  Supine to sit Supervision  Sit to supine Supervision  Transfers  Overall transfer level Needs assistance  Equipment used Rolling walker (2 wheeled)  Transfers Sit to/from Stand  Sit to Stand Supervision  General transfer comment verbal cues for UE and LE positioning  Ambulation/Gait  Ambulation/Gait assistance Supervision;Min guard  Ambulation Distance (Feet) 120 Feet  Assistive device Rolling walker (2 wheeled)  Gait Pattern/deviations Step-through pattern;Antalgic  General Gait Details verbal cues for RW positioning and posture  PT - End of Session  Activity Tolerance Patient tolerated treatment well  Patient left in bed;with call bell/phone within reach;with family/visitor present  PT - Assessment/Plan  PT Plan Current plan remains appropriate  PT Frequency (ACUTE ONLY) 7X/week  Follow Up Recommendations Home health PT  PT equipment None recommended by PT  PT Goal Progression  Progress towards PT goals Progressing toward goals  PT  Time Calculation  PT Start Time (ACUTE ONLY) 1310  PT Stop Time (ACUTE ONLY) 1320  PT Time Calculation (min) (ACUTE ONLY) 10 min  PT General Charges  $$ ACUTE PT VISIT 1 Procedure  PT Treatments  $Gait Training 8-22 mins   Zenovia JarredKati Algenis Ballin, PT, DPT 11/19/2015 Pager: [phone]

## 2015-11-19 NOTE — Care Management Note (Signed)
Case Management Note  Patient Details  Name: Lauren English MRN: [id number] Date of Birth: [birth year]  Subjective/Objective:                  RIGHT TOTAL KNEE ARTHROPLASTY (Right) LEFT KNEE CLOSED MANIPULATION (Left) Action/Plan: Discharge planning  Expected Discharge Date:  11/20/15               Expected Discharge Plan:  Home/Self Care  In-House Referral:     Discharge planning Services  CM Consult  Post Acute Care Choice:    Choice offered to:  Patient  DME Arranged:  N/A DME Agency:  NA  HH Arranged:  NA HH Agency:  NA  Status of Service:  Completed, signed off  If discussed at [place] of Stay Meetings, dates discussed:    Additional Comments: CM met with pt to confirm plan is for Virtual PT; pt confirms.  Pt has all DME needed at home.  No other CM needs were communicated. Dellie Catholic, RN 11/19/2015, 3:30 PM

## 2015-11-19 NOTE — Discharge Instructions (Signed)
° °Dr. Frank Aluisio °Total Joint Specialist °[facility] °[address] °[place], [place] [address] °[phone] ° °TOTAL KNEE REPLACEMENT POSTOPERATIVE DIRECTIONS ° °Knee Rehabilitation, Guidelines Following Surgery  °Results after knee surgery are often greatly improved when you follow the exercise, range of motion and muscle strengthening exercises prescribed by your doctor. Safety measures are also important to protect the knee from further injury. Any time any of these exercises cause you to have increased pain or swelling in your knee joint, decrease the amount until you are comfortable again and slowly increase them. If you have problems or questions, call your caregiver or physical therapist for advice.  ° °HOME CARE INSTRUCTIONS  °Remove items at home which could result in a fall. This includes throw rugs or furniture in walking pathways.  °· ICE to the affected knee every three hours for 30 minutes at a time and then as needed for pain and swelling.  Continue to use ice on the knee for pain and swelling from surgery. You may notice swelling that will progress down to the foot and ankle.  This is normal after surgery.  Elevate the leg when you are not up walking on it.   °· Continue to use the breathing machine which will help keep your temperature down.  It is common for your temperature to cycle up and down following surgery, especially at night when you are not up moving around and exerting yourself.  The breathing machine keeps your lungs expanded and your temperature down. °· Do not place pillow under knee, focus on keeping the knee straight while resting ° °DIET °You may resume your previous home diet once your are discharged from the hospital. ° °DRESSING / WOUND CARE / SHOWERING °You may shower 3 days after surgery, but keep the wounds dry during showering.  You may use an occlusive plastic wrap (Press'n Seal for example), NO SOAKING/SUBMERGING IN THE BATHTUB.  If the  bandage gets wet, change with a clean dry gauze.  If the incision gets wet, pat the wound dry with a clean towel. °You may start showering once you are discharged home but do not submerge the incision under water. Just pat the incision dry and apply a dry gauze dressing on daily. °Change the surgical dressing daily and reapply a dry dressing each time. ° °ACTIVITY °Walk with your walker as instructed. °Use walker as long as suggested by your caregivers. °Avoid periods of inactivity such as sitting longer than an hour when not asleep. This helps prevent blood clots.  °You may resume a sexual relationship in one month or when given the OK by your doctor.  °You may return to work once you are cleared by your doctor.  °Do not drive a car for 6 weeks or until released by you surgeon.  °Do not drive while taking narcotics. ° °WEIGHT BEARING °Weight bearing as tolerated with assist device (walker, cane, etc) as directed, use it as long as suggested by your surgeon or therapist, typically at least 4-6 weeks. ° °POSTOPERATIVE CONSTIPATION PROTOCOL °Constipation - defined medically as fewer than three stools per week and severe constipation as less than one stool per week. ° °One of the most common issues patients have following surgery is constipation.  Even if you have a regular bowel pattern at home, your normal regimen is likely to be disrupted due to multiple reasons following surgery.  Combination of anesthesia, postoperative narcotics, change in appetite and fluid intake all can affect your bowels.    In order to avoid complications following surgery, here are some recommendations in order to help you during your recovery period. ° °Colace (docusate) - Pick up an over-the-counter form of Colace or another stool softener and take twice a day as long as you are requiring postoperative pain medications.  Take with a full glass of water daily.  If you experience loose stools or diarrhea, hold the colace until you stool forms  back up.  If your symptoms do not get better within 1 week or if they get worse, check with your doctor. ° °Dulcolax (bisacodyl) - Pick up over-the-counter and take as directed by the product packaging as needed to assist with the movement of your bowels.  Take with a full glass of water.  Use this product as needed if not relieved by Colace only.  ° °MiraLax (polyethylene glycol) - Pick up over-the-counter to have on hand.  MiraLax is a solution that will increase the amount of water in your bowels to assist with bowel movements.  Take as directed and can mix with a glass of water, juice, soda, coffee, or tea.  Take if you go more than two days without a movement. °Do not use MiraLax more than once per day. Call your doctor if you are still constipated or irregular after using this medication for 7 days in a row. ° °If you continue to have problems with postoperative constipation, please contact the office for further assistance and recommendations.  If you experience "the worst abdominal pain ever" or develop nausea or vomiting, please contact the office immediatly for further recommendations for treatment. ° °ITCHING ° If you experience itching with your medications, try taking only a single pain pill, or even half a pain pill at a time.  You can also use Benadryl over the counter for itching or also to help with sleep.  ° °TED HOSE STOCKINGS °Wear the elastic stockings on both legs for three weeks following surgery during the day but you may remove then at night for sleeping. ° °MEDICATIONS °See your medication summary on the “After Visit Summary” that the nursing staff will review with you prior to discharge.  You may have some home medications which will be placed on hold until you complete the course of blood thinner medication.  It is important for you to complete the blood thinner medication as prescribed by your surgeon.  Continue your approved medications as instructed at time of  discharge. ° °PRECAUTIONS °If you experience chest pain or shortness of breath - call 911 immediately for transfer to the hospital emergency department.  °If you develop a fever greater that 101 F, purulent drainage from wound, increased redness or drainage from wound, foul odor from the wound/dressing, or calf pain - CONTACT YOUR SURGEON.   °                                                °FOLLOW-UP APPOINTMENTS °Make sure you keep all of your appointments after your operation with your surgeon and caregivers. You should call the office at the above phone number and make an appointment for approximately two weeks after the date of your surgery or on the date instructed by your surgeon outlined in the "After Visit Summary". ° ° °RANGE OF MOTION AND STRENGTHENING EXERCISES  °Rehabilitation of the knee is important following a knee injury or   an operation. After just a few days of immobilization, the muscles of the thigh which control the knee become weakened and shrink (atrophy). Knee exercises are designed to build up the tone and strength of the thigh muscles and to improve knee motion. Often times heat used for twenty to thirty minutes before working out will loosen up your tissues and help with improving the range of motion but do not use heat for the first two weeks following surgery. These exercises can be done on a training (exercise) mat, on the floor, on a table or on a bed. Use what ever works the best and is most comfortable for you Knee exercises include:  Leg Lifts - While your knee is still immobilized in a splint or cast, you can do straight leg raises. Lift the leg to 60 degrees, hold for 3 sec, and slowly lower the leg. Repeat 10-20 times 2-3 times daily. Perform this exercise against resistance later as your knee gets better.  Quad and Hamstring Sets - Tighten up the muscle on the front of the thigh (Quad) and hold for 5-10 sec. Repeat this 10-20 times hourly. Hamstring sets are done by pushing the  foot backward against an object and holding for 5-10 sec. Repeat as with quad sets.   Leg Slides: Lying on your back, slowly slide your foot toward your buttocks, bending your knee up off the floor (only go as far as is comfortable). Then slowly slide your foot back down until your leg is flat on the floor again.  Angel Wings: Lying on your back spread your legs to the side as far apart as you can without causing discomfort.  A rehabilitation program following serious knee injuries can speed recovery and prevent re-injury in the future due to weakened muscles. Contact your doctor or a physical therapist for more information on knee rehabilitation.   IF YOU ARE TRANSFERRED TO A SKILLED REHAB FACILITY If the patient is transferred to a skilled rehab facility following release from the hospital, a list of the current medications will be sent to the facility for the patient to continue.  When discharged from the skilled rehab facility, please have the facility set up the patient's Home Health Physical Therapy prior to being released. Also, the skilled facility will be responsible for providing the patient with their medications at time of release from the facility to include their pain medication, the muscle relaxants, and their blood thinner medication. If the patient is still at the rehab facility at time of the two week follow up appointment, the skilled rehab facility will also need to assist the patient in arranging follow up appointment in our office and any transportation needs.  MAKE SURE YOU:  Understand these instructions.  Get help right away if you are not doing well or get worse.    Pick up stool softner and laxative for home use following surgery while on pain medications. Do not submerge incision under water. Please use good hand washing techniques while changing dressing each day. May shower starting three days after surgery. Please use a clean towel to pat the incision dry following  showers. Continue to use ice for pain and swelling after surgery. Do not use any lotions or creams on the incision until instructed by your surgeon.  Take a 325 mg Aspirin daily for three weeks following discharge. Once the patient has completed the 325 mg Aspirin, then take a Baby 81 mg Aspirin daily for three more weeks.

## 2015-11-19 NOTE — Discharge Summary (Signed)
Physician Discharge Summary   Patient ID: Lauren English MRN: [id number] DOB/AGE: [birth year] 75 y.o.  Admit date: 11/18/2015 Discharge date: 11/20/2015  Primary Diagnosis:  Osteoarthritis Right knee(s) Arthrofibrosis Left knee Admission Diagnoses:  Past Medical History  Diagnosis Date  . PONV (postoperative nausea and vomiting)   . Bladder incontinence   . OSA on CPAP   . Arthritis     "qwhere; feet, knees, neck" (04/21/2012)  . Chronic back pain   . Closed angle glaucoma     "both eyes" (04/21/2012)  . Prediabetes   . Urinary tract bacterial infections   . Overactive bladder   . Numbness     fingertips bilat and feet bilat   . Peripheral neuropathy (HCC)     feet bilat   . Difficult intubation     with intubation on November 26, 2014  . Pneumonia     hx. of as child  . Anemia    Discharge Diagnoses:   Active Problems:   OA (osteoarthritis) of knee  Estimated body mass index is 29.23 kg/(m^2) as calculated from the following:   Height as of this encounter: 5' 8"  (1.727 m).   Weight as of this encounter: 87.176 kg (192 lb 3 oz).  Procedure:  Procedure(s) (LRB): RIGHT TOTAL KNEE ARTHROPLASTY (Right) LEFT KNEE CLOSED MANIPULATION (Left)   Consults: None  HPI: Lauren English is a 75 y.o. year old female with end stage OA of her right knee with progressively worsening pain and dysfunction. She has constant pain, with activity and at rest and significant functional deficits with difficulties even with ADLs. She has had extensive non-op management including analgesics, injections of cortisone and viscosupplements, and home exercise program, but remains in significant pain with significant dysfunction.Radiographs show bone on bone arthritis medial and patellofemoral. She presents now for right Total Knee Arthroplasty. 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 WBC 11/19/2015 7.1  4.0 - 10.5 K/uL Final  . RBC 11/19/2015 3.98  3.87 - 5.11 MIL/uL Final  . Hemoglobin 11/19/2015 10.9* 12.0 - 15.0 g/dL Final  . HCT 11/19/2015 33.7* 36.0 - 46.0 % Final  . MCV 11/19/2015 84.7  78.0 - 100.0 fL Final  . MCH 11/19/2015 27.4  26.0 - 34.0 pg Final  . MCHC 11/19/2015 32.3  30.0 - 36.0 g/dL Final  . RDW 11/19/2015 13.9  11.5 - 15.5 % Final  . Platelets 11/19/2015 132* 150 - 400 K/uL Final  . Sodium 11/19/2015 137  135 - 145 mmol/L Final  . Potassium 11/19/2015 4.1  3.5 - 5.1 mmol/L Final  . Chloride 11/19/2015 106  101 - 111 mmol/L Final  . CO2 11/19/2015 27  22 - 32 mmol/L Final  . Glucose, Bld 11/19/2015 107* 65 - 99 mg/dL Final  . BUN 11/19/2015 14  6 - 20 mg/dL Final  . Creatinine, Ser 11/19/2015 0.69  0.44 - 1.00 mg/dL Final  . Calcium 11/19/2015 8.4* 8.9 - 10.3 mg/dL Final  . GFR calc non Af Amer 11/19/2015 >60  >60 mL/min Final  . GFR calc Af Amer 11/19/2015 >60  >60 mL/min Final   Comment: (NOTE) The eGFR has been calculated using the CKD EPI equation. This calculation has not been validated in all clinical situations. eGFR's persistently <60 mL/min signify possible Chronic Kidney Disease.   Georgiann Hahn gap 11/19/2015 4* 5 - 15 Final  Hospital Outpatient  Visit on 11/08/2015  Component Date Value Ref Range Status  . MRSA, PCR 11/08/2015 NEGATIVE  NEGATIVE Final  . Staphylococcus aureus 11/08/2015 NEGATIVE  NEGATIVE Final   Comment:        The Xpert SA Assay (FDA approved for NASAL specimens in patients over 45 years of age), is one component of a comprehensive surveillance program.  Test performance has been validated by [facility] for patients greater than or equal to 38 year old. It is not intended to diagnose infection nor to guide or monitor treatment.   Marland Kitchen aPTT 11/08/2015 27  24 - 37 seconds Final  . WBC 11/08/2015 4.5  4.0 - 10.5 K/uL Final  . RBC 11/08/2015 4.75  3.87 - 5.11  MIL/uL Final  . Hemoglobin 11/08/2015 12.9  12.0 - 15.0 g/dL Final  . HCT 11/08/2015 39.8  36.0 - 46.0 % Final  . MCV 11/08/2015 83.8  78.0 - 100.0 fL Final  . MCH 11/08/2015 27.2  26.0 - 34.0 pg Final  . MCHC 11/08/2015 32.4  30.0 - 36.0 g/dL Final  . RDW 11/08/2015 14.4  11.5 - 15.5 % Final  . Platelets 11/08/2015 187  150 - 400 K/uL Final  . Prothrombin Time 11/08/2015 13.1  11.6 - 15.2 seconds Final  . INR 11/08/2015 1.01  0.00 - 1.49 Final  . ABO/RH(D) 11/08/2015 O POS   Final  . Antibody Screen 11/08/2015 NEG   Final  . Sample Expiration 11/08/2015 11/21/2015   Final  . Extend sample reason 11/08/2015 NO TRANSFUSIONS OR PREGNANCY IN THE PAST 3 MONTHS   Final  . Color, Urine 11/08/2015 YELLOW  YELLOW Final  . APPearance 11/08/2015 CLEAR  CLEAR Final  . Specific Gravity, Urine 11/08/2015 1.019  1.005 - 1.030 Final  . pH 11/08/2015 5.5  5.0 - 8.0 Final  . Glucose, UA 11/08/2015 NEGATIVE  NEGATIVE mg/dL Final  . Hgb urine dipstick 11/08/2015 NEGATIVE  NEGATIVE Final  . Bilirubin Urine 11/08/2015 NEGATIVE  NEGATIVE Final  . Ketones, ur 11/08/2015 NEGATIVE  NEGATIVE mg/dL Final  . Protein, ur 11/08/2015 NEGATIVE  NEGATIVE mg/dL Final  . Nitrite 11/08/2015 NEGATIVE  NEGATIVE Final  . Leukocytes, UA 11/08/2015 MODERATE* NEGATIVE Final  . Squamous Epithelial / LPF 11/08/2015 0-5* NONE SEEN Final  . WBC, UA 11/08/2015 6-30  0 - 5 WBC/hpf Final  . RBC / HPF 11/08/2015 0-5  0 - 5 RBC/hpf Final  . Bacteria, UA 11/08/2015 FEW* NONE SEEN Final  Appointment on 10/17/2015  Component Date Value Ref Range Status  . WBC 10/17/2015 5.2  3.9 - 10.3 10e3/uL Final  . NEUT# 10/17/2015 2.6  1.5 - 6.5 10e3/uL Final  . HGB 10/17/2015 12.7  11.6 - 15.9 g/dL Final  . HCT 10/17/2015 39.4  34.8 - 46.6 % Final  . Platelets 10/17/2015 173  145 - 400 10e3/uL Final  . MCV 10/17/2015 82.5  79.5 - 101.0 fL Final  . MCH 10/17/2015 26.6  25.1 - 34.0 pg Final  . MCHC 10/17/2015 32.2  31.5 - 36.0 g/dL Final  .  RBC 10/17/2015 4.77  3.70 - 5.45 10e6/uL Final  . RDW 10/17/2015 15.5* 11.2 - 14.5 % Final  . lymph# 10/17/2015 1.9  0.9 - 3.3 10e3/uL Final  . MONO# 10/17/2015 0.4  0.1 - 0.9 10e3/uL Final  . Eosinophils Absolute 10/17/2015 0.1  0.0 - 0.5 10e3/uL Final  . Basophils Absolute 10/17/2015 0.0  0.0 - 0.1 10e3/uL Final  . NEUT% 10/17/2015 50.8  38.4 - 76.8 % Final  .  LYMPH% 10/17/2015 37.8  14.0 - 49.7 % Final  . MONO% 10/17/2015 8.1  0.0 - 14.0 % Final  . EOS% 10/17/2015 2.6  0.0 - 7.0 % Final  . BASO% 10/17/2015 0.7  0.0 - 2.0 % Final  . Iron 10/17/2015 55  41 - 142 ug/dL Final  . TIBC 10/17/2015 248  236 - 444 ug/dL Final  . UIBC 10/17/2015 192  120 - 384 ug/dL Final  . %SAT 10/17/2015 22  21 - 57 % Final  . Ferritin 10/17/2015 162  9 - 269 ng/ml Final     X-Rays:No results found.  EKG: Orders placed or performed during the hospital encounter of 11/19/14  . EKG 12-Lead  . EKG 12-Lead     Hospital Course: Lauren English is a 75 y.o. who was admitted to [facility]. They were brought to the operating room on 11/18/2015 and underwent Procedure(s): RIGHT TOTAL KNEE ARTHROPLASTY LEFT KNEE CLOSED MANIPULATION.  Patient tolerated the procedure well and was later transferred to the recovery room and then to the orthopaedic floor for postoperative care.  They were given PO and IV analgesics for pain control following their surgery.  They were given 24 hours of postoperative antibiotics of  Anti-infectives    Start     Dose/Rate Route Frequency Ordered Stop   11/18/15 1730  ceFAZolin (ANCEF) IVPB 2g/100 mL premix     2 g 200 mL/hr over 30 Minutes Intravenous Every 6 hours 11/18/15 1423 11/18/15 2249   11/18/15 0828  ceFAZolin (ANCEF) IVPB 2g/100 mL premix     2 g 200 mL/hr over 30 Minutes Intravenous On call to O.R. 11/18/15 5643 11/18/15 1111     and started on DVT prophylaxis in the form of Aspirin.   PT and OT were ordered for total joint protocol.  Discharge planning consulted  to help with postop disposition and equipment needs.  Patient had a tough night on the evening of surgery with spasms.  They started to get up OOB with therapy on day one. Hemovac drain was pulled without difficulty.  Continued to work with therapy into day two.  Dressing was changed on day two and the incision was healing well.  Patient was seen in rounds on day two by Dr. Wynelle Link and it was felt that she would be ready to go home later that day following therapy.  Discharge home Virtual Therapy at home. Diet - Diabetic diet Follow up - in 2 weeks Activity - WBAT Disposition - Home Condition Upon Discharge - Good D/C Meds - See DC Summary DVT Prophylaxis - Aspirin 325 mg   Discharge Instructions    Call MD / Call 911    Complete by:  As directed   If you experience chest pain or shortness of breath, CALL 911 and be transported to the hospital emergency room.  If you develope a fever above 101 F, pus (white drainage) or increased drainage or redness at the wound, or calf pain, call your surgeon's office.     Change dressing    Complete by:  As directed   Change dressing daily with sterile 4 x 4 inch gauze dressing and apply TED hose. Do not submerge the incision under water.     Constipation Prevention    Complete by:  As directed   Drink plenty of fluids.  Prune juice may be helpful.  You may use a stool softener, such as Colace (over the counter) 100 mg twice a day.  Use MiraLax (  over the counter) for constipation as needed.     Diet - low sodium heart healthy    Complete by:  As directed      Discharge instructions    Complete by:  As directed   Pick up stool softner and laxative for home use following surgery while on pain medications. Do not submerge incision under water. Please use good hand washing techniques while changing dressing each day. May shower starting three days after surgery. Please use a clean towel to pat the incision dry following showers. Continue to use ice for  pain and swelling after surgery. Do not use any lotions or creams on the incision until instructed by your surgeon.  Take a 325 mg Aspirin daily for three weeks following discharge. Once the patient has completed the 325 mg Aspirin, then take a Baby 81 mg Aspirin daily for three more weeks.  Postoperative Constipation Protocol  Constipation - defined medically as fewer than three stools per week and severe constipation as less than one stool per week.  One of the most common issues patients have following surgery is constipation.  Even if you have a regular bowel pattern at home, your normal regimen is likely to be disrupted due to multiple reasons following surgery.  Combination of anesthesia, postoperative narcotics, change in appetite and fluid intake all can affect your bowels.  In order to avoid complications following surgery, here are some recommendations in order to help you during your recovery period.  Colace (docusate) - Pick up an over-the-counter form of Colace or another stool softener and take twice a day as long as you are requiring postoperative pain medications.  Take with a full glass of water daily.  If you experience loose stools or diarrhea, hold the colace until you stool forms back up.  If your symptoms do not get better within 1 week or if they get worse, check with your doctor.  Dulcolax (bisacodyl) - Pick up over-the-counter and take as directed by the product packaging as needed to assist with the movement of your bowels.  Take with a full glass of water.  Use this product as needed if not relieved by Colace only.   MiraLax (polyethylene glycol) - Pick up over-the-counter to have on hand.  MiraLax is a solution that will increase the amount of water in your bowels to assist with bowel movements.  Take as directed and can mix with a glass of water, juice, soda, coffee, or tea.  Take if you go more than two days without a movement. Do not use MiraLax more than once per day.  Call your doctor if you are still constipated or irregular after using this medication for 7 days in a row.  If you continue to have problems with postoperative constipation, please contact the office for further assistance and recommendations.  If you experience "the worst abdominal pain ever" or develop nausea or vomiting, please contact the office immediatly for further recommendations for treatment.     Do not put a pillow under the knee. Place it under the heel.    Complete by:  As directed      Do not sit on low chairs, stoools or toilet seats, as it may be difficult to get up from low surfaces    Complete by:  As directed      Driving restrictions    Complete by:  As directed   No driving until released by the physician.     Increase activity slowly as  tolerated    Complete by:  As directed      Lifting restrictions    Complete by:  As directed   No lifting until released by the physician.     Patient may shower    Complete by:  As directed   You may shower without a dressing once there is no drainage.  Do not wash over the wound.  If drainage remains, do not shower until drainage stops.     TED hose    Complete by:  As directed   Use stockings (TED hose) for 3 weeks on both leg(s).  You may remove them at night for sleeping.     Weight bearing as tolerated    Complete by:  As directed   Laterality:  right  Extremity:  Lower            Medication List    STOP taking these medications        ACETYLCARNITINE PO     calcium carbonate 600 MG tablet  Commonly known as:  OS-CAL     TURMERIC PO     Vitamin D3 2000 units Tabs      TAKE these medications        aspirin 325 MG EC tablet  Take 1 tablet (325 mg total) by mouth daily with breakfast. Take 325 mg Aspirin daily for three weeks following discharge. Once the patient has completed the 325 mg Aspirin, then take a Baby 81 mg Aspirin daily for three more weeks.     docusate sodium 100 MG capsule  Commonly known as:   COLACE  Take 100 mg by mouth 2 (two) times daily.     ferrous sulfate 325 (65 FE) MG tablet  Take 325 mg by mouth 2 (two) times daily with a meal.     HYDROmorphone 2 MG tablet  Commonly known as:  DILAUDID  Take 1-2 tablets (2-4 mg total) by mouth every 4 (four) hours as needed for moderate pain or severe pain.     methocarbamol 500 MG tablet  Commonly known as:  ROBAXIN  Take 1 tablet (500 mg total) by mouth every 6 (six) hours as needed for muscle spasms.     MYRBETRIQ 50 MG Tb24 tablet  Generic drug:  mirabegron ER  Take 50 mg by mouth daily.     REFRESH 1.4-0.6 % ophthalmic solution  Generic drug:  polyvinyl alcohol-povidone  Place 1 drop into both eyes daily.     traMADol 50 MG tablet  Commonly known as:  ULTRAM  Take 1-2 tablets (50-100 mg total) by mouth every 6 (six) hours as needed (mild pain).           Follow-up Information    Follow up with Gearlean Alf, MD. Schedule an appointment as soon as possible for a visit on 12/03/2015.   Specialty:  Orthopedic Surgery   Why:  Call office at [phone] to setup appointment on Tuesday 12/03/2015 with Dr. Wynelle Link.   Contact information:   [address] [place] [address] [phone]       Signed: Arlee Muslim, PA-C Orthopaedic Surgery 11/19/2015, 10:17 PM

## 2015-11-19 NOTE — Evaluation (Signed)
Physical Therapy Evaluation Patient Details Name: Lauren English MRN: [id number] DOB: 1941-04-05 Today's Date: 11/19/2015   History of Present Illness  Pt is a 75 year old female s/p R TKA and L knee manipulation  Clinical Impression  Pt is s/p R TKA resulting in the deficits listed below (see PT Problem List).  Pt will benefit from skilled PT to increase their independence and safety with mobility to allow discharge to the venue listed below.   Pt plans to d/c home with assist from her daughter.  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     Follow Up Recommendations Home health PT    Equipment Recommendations  None recommended by PT    Recommendations for Other Services       Precautions / Restrictions Precautions Precautions: Knee;Fall Precaution Comments: able to perform SLR Restrictions Weight Bearing Restrictions: No Other Position/Activity Restrictions: WBAT      Mobility  Bed Mobility Overal bed mobility: Needs Assistance Bed Mobility: Supine to Sit     Supine to sit: Supervision     General bed mobility comments: supervision for lines  Transfers Overall transfer level: Needs assistance Equipment used: Rolling walker (2 wheeled) Transfers: Sit to/from Stand Sit to Stand: Min guard         General transfer comment: verbal cues for UE and LE positioning  Ambulation/Gait Ambulation/Gait assistance: Min guard Ambulation Distance (Feet): 120 Feet Assistive device: Rolling walker (2 wheeled) Gait Pattern/deviations: Step-to pattern;Antalgic;Decreased stride length     General Gait Details: verbal cues for sequence, RW positioning, step length, posture  Stairs            Wheelchair Mobility    Modified Rankin (Stroke Patients Only)       Balance                                             Pertinent Vitals/Pain Pain Assessment: 0-10 Pain Score: 4  Pain Location: R  knee Pain Intervention(s): Limited activity within patient's tolerance;Monitored during session;Premedicated before session;Repositioned;Ice applied    Home Living Family/patient expects to be discharged to:: Private residence Living Arrangements: Spouse/significant other Available Help at Discharge: Family Type of Home: House Home Access: Stairs to enter Entrance Stairs-Rails: Right Entrance Stairs-Number of Steps: 3 Home Layout: Able to live on main level with bedroom/bathroom Home Equipment: Toilet riser;Shower seat;Bedside commode;Walker - 2 wheels;Crutches Additional Comments: spouse has parkinson's dementia, daughter coming to assist; here until 7/22    Prior Function Level of Independence: Independent with assistive device(s)               Hand Dominance        Extremity/Trunk Assessment   Upper Extremity Assessment: Overall WFL for tasks assessed           Lower Extremity Assessment: RLE deficits/detail;LLE deficits/detail RLE Deficits / Details: able to perform SLR, approx 60* AAROM sitting LLE Deficits / Details: pt reports 95* flexion premanipulation, approx 50* AAROM sitting     Communication   Communication: No difficulties  Cognition Arousal/Alertness: Awake/alert Behavior During Therapy: WFL for tasks assessed/performed Overall Cognitive Status: Within Functional Limits for tasks assessed                      General Comments      Exercises Total Joint Exercises Ankle Circles/Pumps: AROM;Both;10  reps Quad Sets: AROM;Both;10 reps Short Arc Quad: AROM;Both;10 reps Heel Slides: AAROM;Seated;Both;10 reps Hip ABduction/ADduction: AROM;10 reps;Right Straight Leg Raises: AROM;Right;10 reps      Assessment/Plan    PT Assessment Patient needs continued PT services  PT Diagnosis Difficulty walking;Acute pain   PT Problem List Decreased strength;Decreased range of motion;Pain;Decreased mobility  PT Treatment Interventions Functional  mobility training;Stair training;Gait training;DME instruction;Patient/family education;Therapeutic activities;Therapeutic exercise   PT Goals (Current goals can be found in the Care Plan section) Acute Rehab PT Goals Patient Stated Goal: return to independence PT Goal Formulation: With patient Time For Goal Achievement: 11/23/15 Potential to Achieve Goals: Good    Frequency 7X/week   Barriers to discharge        Co-evaluation               End of Session Equipment Utilized During Treatment: Gait belt Activity Tolerance: Patient tolerated treatment well Patient left: in chair;with call bell/phone within reach;with chair alarm set           Time: [id number] PT Time Calculation (min) (ACUTE ONLY): 27 min   Charges:   PT Evaluation $PT Eval Low Complexity: 1 Procedure PT Treatments $Therapeutic Exercise: 8-22 mins   PT G Codes:        Kenney Going,KATHrine E 11/19/2015, 11:32 AM Zenovia JarredKati Toshiko Kemler, PT, DPT 11/19/2015 Pager: [phone]

## 2015-11-19 NOTE — Evaluation (Signed)
Occupational Therapy Evaluation Patient Details Name: Lauren KinderJanet G Cruickshank MRN: [id number] DOB: 01-22-1941 Today's Date: 11/19/2015    History of Present Illness s/p R TKA and L manipulation   Clinical Impression   This 75 year old female was admitted for the above sx. All education was completed.  No further OT is needed at this time    Follow Up Recommendations  Supervision/Assistance - 24 hour;No OT follow up    Equipment Recommendations  None recommended by OT    Recommendations for Other Services       Precautions / Restrictions Precautions Precautions: Knee;Fall Restrictions Weight Bearing Restrictions: No      Mobility Bed Mobility               General bed mobility comments: oob  Transfers Overall transfer level: Needs assistance   Transfers: Sit to/from Stand Sit to Stand: Min guard         General transfer comment: for safety.  Cues for UE/LE placement    Balance                                            ADL Overall ADL's : Needs assistance/impaired     Grooming: Oral care;Supervision/safety;Standing   Upper Body Bathing: Set up;Sitting   Lower Body Bathing: Minimal assistance;Sit to/from stand   Upper Body Dressing : Set up;Sitting   Lower Body Dressing: Moderate assistance;Sit to/from stand   Toilet Transfer: Min guard;Ambulation;BSC   Toileting- ArchitectClothing Manipulation and Hygiene: Min guard;Sit to/from stand   Tub/ Shower Transfer: Walk-in shower;Min guard;Ambulation     General ADL Comments: reviewed all education including precautions.  Daughter will be here until the 22nd.  Pt has laundry downstairs.  Encouraged her to have friends assist when they offer help     Vision     Perception     Praxis      Pertinent Vitals/Pain Pain Assessment: 0-10 Pain Score: 4  Pain Location: R knee Pain Intervention(s): Limited activity within patient's tolerance;Monitored during session;Premedicated before  session;Repositioned;Ice applied     Hand Dominance     Extremity/Trunk Assessment Upper Extremity Assessment Upper Extremity Assessment: Overall WFL for tasks assessed           Communication Communication Communication: No difficulties   Cognition Arousal/Alertness: Awake/alert Behavior During Therapy: WFL for tasks assessed/performed Overall Cognitive Status: Within Functional Limits for tasks assessed                     General Comments       Exercises       Shoulder Instructions      Home Living Family/patient expects to be discharged to:: Private residence Living Arrangements: Spouse/significant other Available Help at Discharge: Family Type of Home: House Home Access: Stairs to enter Secretary/administratorntrance Stairs-Number of Steps: 3 Entrance Stairs-Rails: Right Home Layout: Able to live on main level with bedroom/bathroom     Bathroom Shower/Tub: Producer, television/film/videoWalk-in shower   Bathroom Toilet: Standard     Home Equipment: Toilet riser;Shower seat;Bedside commode;Walker - 2 wheels;Crutches   Additional Comments: spouse has parkinson's dementia, daughter coming to assist; here until 7/22      Prior Functioning/Environment Level of Independence: Independent with assistive device(s)             OT Diagnosis: Acute pain   OT Problem List:     OT Treatment/Interventions:  OT Goals(Current goals can be found in the care plan section) Acute Rehab OT Goals Patient Stated Goal: return to independence OT Goal Formulation: All assessment and education complete, DC therapy  OT Frequency:     Barriers to D/C:            Co-evaluation              End of Session CPM Right Knee CPM Right Knee: Off  Activity Tolerance: Patient tolerated treatment well Patient left: in chair;with call bell/phone within reach;with chair alarm set;with family/visitor present   Time: 1610-9604 OT Time Calculation (min): 26 min Charges:  OT General Charges $OT Visit: 1  Procedure OT Evaluation $OT Eval Low Complexity: 1 Procedure OT Treatments $Self Care/Home Management : 8-22 mins G-Codes:    Mylan Schwarz 2015-12-06, 11:00 AM  Marica Otter, OTR/L [phone] December 06, 2015

## 2015-11-20 LAB — BASIC METABOLIC PANEL
Anion gap: 4 — ABNORMAL LOW (ref 5–15)
BUN: 16 mg/dL (ref 6–20)
CO2: 27 mmol/L (ref 22–32)
Calcium: 8.6 mg/dL — ABNORMAL LOW (ref 8.9–10.3)
Chloride: 105 mmol/L (ref 101–111)
Creatinine, Ser: 0.73 mg/dL (ref 0.44–1.00)
GFR calc Af Amer: >60 mL/min (ref >60)
GFR calc non Af Amer: >60 mL/min (ref >60)
Glucose, Bld: 104 mg/dL — ABNORMAL HIGH (ref 65–99)
Potassium: 4 mmol/L (ref 3.5–5.1)
Sodium: 136 mmol/L (ref 135–145)

## 2015-11-20 LAB — CBC
HCT: 33.2 % — ABNORMAL LOW (ref 36.0–46.0)
Hemoglobin: 11.1 g/dL — ABNORMAL LOW (ref 12.0–15.0)
MCH: 28.1 pg (ref 26.0–34.0)
MCHC: 33.4 g/dL (ref 30.0–36.0)
MCV: 84.1 fL (ref 78.0–100.0)
PLATELETS: 140 10*3/uL — AB (ref 150–400)
RBC: 3.95 MIL/uL (ref 3.87–5.11)
RDW: 14.2 % (ref 11.5–15.5)
WBC: 6.8 10*3/uL (ref 4.0–10.5)

## 2015-11-20 MED ORDER — ASPIRIN 325 MG PO TBEC: 325.0000 mg | DELAYED_RELEASE_TABLET | Freq: Every day | ORAL | Status: DC

## 2015-11-20 MED ORDER — METHOCARBAMOL 500 MG PO TABS: 500.0000 mg | ORAL_TABLET | Freq: Four times a day (QID) | ORAL | Status: DC | PRN

## 2015-11-20 MED ORDER — HYDROCODONE-ACETAMINOPHEN 5-325 MG PO TABS: 1.0000 | ORAL_TABLET | ORAL | Status: DC | PRN

## 2015-11-20 MED ORDER — TRAMADOL HCL 50 MG PO TABS: 50.0000 mg | ORAL_TABLET | Freq: Four times a day (QID) | ORAL | Status: DC | PRN

## 2015-11-20 MED ORDER — HYDROCODONE-ACETAMINOPHEN 5-325 MG PO TABS
1.0000 | ORAL_TABLET | ORAL | Status: DC | PRN
  Administered 2015-11-20 (×2): 2 via ORAL
  Filled 2015-11-20 (×2): qty 2

## 2015-11-20 NOTE — Progress Notes (Signed)
   Subjective: 2 Days Post-Op Procedure(s) (LRB): RIGHT TOTAL KNEE ARTHROPLASTY (Right) LEFT KNEE CLOSED MANIPULATION (Left) Patient reports pain as mild.   Patient seen in rounds with Dr. Lequita HaltAluisio.  Does okay with Norco. Will switch to this prior to discharge. Patient is well, and has had no acute complaints or problems Patient is ready to go home later today  Objective: Vital signs in last 24 hours: Temp:  [97.5 F (36.4 C)-99.4 F (37.4 C)] 99.4 F (37.4 C) (07/12 0532) Pulse Rate:  [50-61] 61 (07/12 0532) Resp:  [16-18] 18 (07/12 0532) BP: (101-142)/(45-54) 138/50 mmHg (07/12 0532) SpO2:  [96 %-98 %] 96 % (07/12 0532)  Intake/Output from previous day:  Intake/Output Summary (Last 24 hours) at 11/20/15 0654 Last data filed at 11/20/15 0534  Gross per 24 hour  Intake 1425.84 ml  Output   2700 ml  Net -1274.16 ml    Intake/Output this shift: Total I/O In: 600 [P.O.:600] Out: 1750 [Urine:1750]  Labs:  Recent Labs  11/19/15 0358 11/20/15 0424  HGB 10.9* 11.1*    Recent Labs  11/19/15 0358 11/20/15 0424  WBC 7.1 6.8  RBC 3.98 3.95  HCT 33.7* 33.2*  PLT 132* 140*    Recent Labs  11/19/15 0358 11/20/15 0424  NA 137 136  K 4.1 4.0  CL 106 105  CO2 27 27  BUN 14 16  CREATININE 0.69 0.73  GLUCOSE 107* 104*  CALCIUM 8.4* 8.6*   No results for input(s): LABPT, INR in the last 72 hours.  EXAM: General - Patient is Alert, Appropriate and Oriented Extremity - Neurovascular intact Sensation intact distally Dorsiflexion/Plantar flexion intact Incision - clean, dry, healing Motor Function - intact, moving foot and toes well on exam.   Assessment/Plan: 2 Days Post-Op Procedure(s) (LRB): RIGHT TOTAL KNEE ARTHROPLASTY (Right) LEFT KNEE CLOSED MANIPULATION (Left) Procedure(s) (LRB): RIGHT TOTAL KNEE ARTHROPLASTY (Right) LEFT KNEE CLOSED MANIPULATION (Left) Past Medical History  Diagnosis Date  . PONV (postoperative nausea and vomiting)   . Bladder  incontinence   . OSA on CPAP   . Arthritis     "qwhere; feet, knees, neck" (04/21/2012)  . Chronic back pain   . Closed angle glaucoma     "both eyes" (04/21/2012)  . Prediabetes   . Urinary tract bacterial infections   . Overactive bladder   . Numbness     fingertips bilat and feet bilat   . Peripheral neuropathy (HCC)     feet bilat   . Difficult intubation     with intubation on November 26, 2014  . Pneumonia     hx. of as child  . Anemia    Active Problems:   OA (osteoarthritis) of knee  Estimated body mass index is 29.23 kg/(m^2) as calculated from the following:   Height as of this encounter: 5\' 8"  (1.727 m).   Weight as of this encounter: 87.176 kg (192 lb 3 oz). Up with therapy Discharge home Virtual Therapy at home. Diet - Diabetic diet Follow up - in 2 weeks Activity - WBAT Disposition - Home Condition Upon Discharge - Good D/C Meds - See DC Summary DVT Prophylaxis - Aspirin 325 mg   Avel Peacerew Garlan Drewes, PA-C Orthopaedic Surgery 11/20/2015, 6:54 AM

## 2015-11-20 NOTE — Progress Notes (Signed)
Physical Therapy Treatment Patient Details Name: Lauren English MRN: [id number] DOB: [birth year] Today's Date: 11/20/2015    History of Present Illness Pt is a 75 year old female s/p R TKA and L knee manipulation    PT Comments    Pt reports rough night however mobilizing well today.  Pt ambulated, practiced steps and performed a few LE exercises.  Pt states she plans to perform her virtual therapy upon return home today.  Pt feels ready for d/c.  Follow Up Recommendations  Home health PT (virtual therapy)     Equipment Recommendations  None recommended by PT    Recommendations for Other Services       Precautions / Restrictions Precautions Precautions: Knee Precaution Comments: able to perform SLR Restrictions Other Position/Activity Restrictions: WBAT    Mobility  Bed Mobility Overal bed mobility: Needs Assistance Bed Mobility: Supine to Sit;Sit to Supine     Supine to sit: Supervision Sit to supine: Supervision      Transfers Overall transfer level: Needs assistance Equipment used: Rolling walker (2 wheeled) Transfers: Sit to/from Stand Sit to Stand: Supervision         General transfer comment: verbal cues for UE and LE positioning  Ambulation/Gait Ambulation/Gait assistance: Supervision Ambulation Distance (Feet): 160 Feet Assistive device: Rolling walker (2 wheeled) Gait Pattern/deviations: Step-through pattern;Antalgic Gait velocity: 22 sec walk test   General Gait Details: verbal cues for RW positioning and posture   Stairs Stairs: Yes Stairs assistance: Min guard Stair Management: Step to pattern;Sideways;One rail Right Number of Stairs: 2 General stair comments: semisideways with one rail bil UE support, performed twice, cues for sequence and safety  Wheelchair Mobility    Modified Rankin (Stroke Patients Only)       Balance                                    Cognition Arousal/Alertness: Awake/alert Behavior  During Therapy: WFL for tasks assessed/performed Overall Cognitive Status: Within Functional Limits for tasks assessed                      Exercises Total Joint Exercises Ankle Circles/Pumps: AROM;Both;10 reps Quad Sets: AROM;Both;10 reps Short Arc Quad: AROM;10 reps;Right Heel Slides: AAROM;Seated;10 reps;Right Straight Leg Raises: AROM;Right;10 reps    General Comments        Pertinent Vitals/Pain Pain Assessment: 0-10 Pain Score: 6  Pain Location: R knee Pain Descriptors / Indicators: Sore;Aching Pain Intervention(s): Limited activity within patient's tolerance;Monitored during session;Repositioned;Ice applied    Home Living                      Prior Function            PT Goals (current goals can now be found in the care plan section) Progress towards PT goals: Progressing toward goals    Frequency  7X/week    PT Plan Current plan remains appropriate    Co-evaluation             End of Session Equipment Utilized During Treatment: Gait belt Activity Tolerance: Patient tolerated treatment well Patient left: in bed;with call bell/phone within reach     Time: 1003-1021 PT Time Calculation (min) (ACUTE ONLY): 18 min  Charges:  $Gait Training: 8-22 mins                    G Codes:  Kristain Filo,KATHrine E 11/20/2015, 12:40 PM Zenovia JarredKati Emily Massar, PT, DPT 11/20/2015 Pager: [phone]

## 2015-12-03 DIAGNOSIS — Z96651 Presence of right artificial knee joint: Secondary | ICD-10-CM | POA: Diagnosis not present

## 2015-12-03 DIAGNOSIS — Z471 Aftercare following joint replacement surgery: Secondary | ICD-10-CM | POA: Diagnosis not present

## 2015-12-09 ENCOUNTER — Ambulatory Visit: Payer: Medicare Other | Admitting: Physical Therapy

## 2015-12-11 ENCOUNTER — Encounter: Payer: Medicare Other | Admitting: Physical Therapy

## 2015-12-13 ENCOUNTER — Encounter: Payer: Medicare Other | Admitting: Physical Therapy

## 2015-12-16 ENCOUNTER — Encounter: Payer: Medicare Other | Admitting: Physical Therapy

## 2015-12-18 ENCOUNTER — Encounter: Payer: Medicare Other | Admitting: Physical Therapy

## 2015-12-20 ENCOUNTER — Encounter: Payer: Medicare Other | Admitting: Physical Therapy

## 2015-12-24 DIAGNOSIS — Z96651 Presence of right artificial knee joint: Secondary | ICD-10-CM | POA: Diagnosis not present

## 2015-12-24 DIAGNOSIS — Z471 Aftercare following joint replacement surgery: Secondary | ICD-10-CM | POA: Diagnosis not present

## 2016-01-16 ENCOUNTER — Other Ambulatory Visit (HOSPITAL_BASED_OUTPATIENT_CLINIC_OR_DEPARTMENT_OTHER): Payer: Medicare Other

## 2016-01-16 ENCOUNTER — Ambulatory Visit (HOSPITAL_BASED_OUTPATIENT_CLINIC_OR_DEPARTMENT_OTHER): Payer: Medicare Other | Admitting: Oncology

## 2016-01-16 VITALS — BP 145/74 | HR 71 | Temp 98.7°F | Resp 17 | Wt 196.8 lb

## 2016-01-16 DIAGNOSIS — L03115 Cellulitis of right lower limb: Secondary | ICD-10-CM | POA: Diagnosis not present

## 2016-01-16 DIAGNOSIS — D509 Iron deficiency anemia, unspecified: Secondary | ICD-10-CM

## 2016-01-16 LAB — CBC WITH DIFFERENTIAL/PLATELET
BASO%: 0.7 % (ref 0.0–2.0)
BASOS ABS: 0 10*3/uL (ref 0.0–0.1)
EOS%: 2.2 % (ref 0.0–7.0)
Eosinophils Absolute: 0.1 10*3/uL (ref 0.0–0.5)
HEMATOCRIT: 37.9 % (ref 34.8–46.6)
HGB: 12.4 g/dL (ref 11.6–15.9)
LYMPH#: 1.6 10*3/uL (ref 0.9–3.3)
LYMPH%: 38.5 % (ref 14.0–49.7)
MCH: 27.9 pg (ref 25.1–34.0)
MCHC: 32.7 g/dL (ref 31.5–36.0)
MCV: 85.2 fL (ref 79.5–101.0)
MONO#: 0.3 10*3/uL (ref 0.1–0.9)
MONO%: 8.4 % (ref 0.0–14.0)
NEUT#: 2 10*3/uL (ref 1.5–6.5)
NEUT%: 50.2 % (ref 38.4–76.8)
PLATELETS: 147 10*3/uL (ref 145–400)
RBC: 4.45 10*6/uL (ref 3.70–5.45)
RDW: 13.4 % (ref 11.2–14.5)
WBC: 4.1 10*3/uL (ref 3.9–10.3)

## 2016-01-16 LAB — IRON AND TIBC
%SAT: 23 % (ref 21–57)
Iron: 58 ug/dL (ref 41–142)
TIBC: 246 ug/dL (ref 236–444)
UIBC: 189 ug/dL (ref 120–384)

## 2016-01-16 LAB — FERRITIN: Ferritin: 197 ng/ml (ref 9–269)

## 2016-01-16 NOTE — Progress Notes (Signed)
Hematology and Oncology Follow Up Visit  Lauren English [id number] Mar 26, 1941 75 y.o. 01/16/2016 10:50 AM   Principle Diagnosis: 75 year old woman with iron deficiency anemia related to chronic blood loss diagnosed in July 2016. She presented with a hemoglobin 9.4, MCV 74 and RDW of 16.9. She had multiple orthopedic surgeries and a hematoma in her left knee accounting for her iron deficiency.   Current therapy: Oral iron replacement with ferrous sulfate 325 mg twice a day started in January 2017.  Interim History: Lauren English presents today for a follow-up visit. Since the last visit, she underwent right knee replacement and tolerated it very well. She denied any complications after surgery and her energy have been excellent. She remained on iron replacement without any drop in her hemoglobin postoperatively. She participated in physical therapy and currently ambulating without any major problems. He is able to drive without any decline. She denied any hematochezia or melena. She does report mild constipation associated with iron replacement.  She does not report any headaches, blurry vision, syncope or seizures. She does not report any fevers, chills sweats. Does not report any cough, wheezing or hemoptysis. She does not report any chest pain, dyspnea on exertion or difficulty breathing. Does not report any nausea, vomiting, abdominal pain. Does not report any frequency, urgency or hesitancy. She does not report any skeletal complaints. Remaining review of systems unremarkable.    Medications: I have reviewed the patient's current medications.  Current Outpatient Prescriptions  Medication Sig Dispense Refill  . aspirin 325 MG EC tablet Take 1 tablet (325 mg total) by mouth daily with breakfast. Take 325 mg Aspirin daily for three weeks following discharge. Once the patient has completed the 325 mg Aspirin, then take a Baby 81 mg Aspirin daily for three more weeks. 21 tablet 0  . docusate sodium  (COLACE) 100 MG capsule Take 100 mg by mouth 2 (two) times daily.    . ferrous sulfate 325 (65 FE) MG tablet Take 325 mg by mouth 2 (two) times daily with a meal.     . MYRBETRIQ 50 MG TB24 tablet Take 50 mg by mouth daily.    . polyvinyl alcohol-povidone (REFRESH) 1.4-0.6 % ophthalmic solution Place 1 drop into both eyes daily.    Marland Kitchen HYDROcodone-acetaminophen (NORCO/VICODIN) 5-325 MG tablet Take 1-2 tablets by mouth every 4 (four) hours as needed for moderate pain. (Patient not taking: Reported on 01/16/2016) 90 tablet 0  . HYDROmorphone (DILAUDID) 2 MG tablet Take 1-2 tablets (2-4 mg total) by mouth every 4 (four) hours as needed for moderate pain or severe pain. (Patient not taking: Reported on 01/16/2016) 90 tablet 0  . methocarbamol (ROBAXIN) 500 MG tablet Take 1 tablet (500 mg total) by mouth every 6 (six) hours as needed for muscle spasms. (Patient not taking: Reported on 01/16/2016) 90 tablet 0  . traMADol (ULTRAM) 50 MG tablet Take 1-2 tablets (50-100 mg total) by mouth every 6 (six) hours as needed (mild pain). (Patient not taking: Reported on 01/16/2016) 80 tablet 1   No current facility-administered medications for this visit.      Allergies:  Allergies  Allergen Reactions  . Oxycodone Rash    Past Medical History, Surgical history, Social history, and Family History were reviewed and updated.   Physical Exam: Blood pressure (!) 145/74, pulse 71, temperature 98.7 F (37.1 C), temperature source Oral, resp. rate 17, weight 196 lb 12.8 oz (89.3 kg), SpO2 98 %. ECOG: 1 General appearance: A well-appearing woman without distress. Head: Normocephalic,  without obvious abnormality no oral thrush noted. Neck: no adenopathy Lymph nodes: Cervical, supraclavicular, and axillary nodes normal. Heart:regular rate and rhythm, S1, S2 normal, no murmur, click, rub or gallop Lung:chest clear, no wheezing, rales, normal symmetric air entry Abdomin: soft, non-tender, without masses or organomegaly  shifting dullness or ascites. EXT:no erythema, induration, or nodules. No joint effusion noted.   Lab Results: Lab Results  Component Value Date   WBC 4.1 01/16/2016   HGB 12.4 01/16/2016   HCT 37.9 01/16/2016   MCV 85.2 01/16/2016   PLT 147 01/16/2016     Chemistry      Component Value Date/Time   NA 136 11/20/2015 0424   K 4.0 11/20/2015 0424   CL 105 11/20/2015 0424   CO2 27 11/20/2015 0424   BUN 16 11/20/2015 0424   CREATININE 0.73 11/20/2015 0424      Component Value Date/Time   CALCIUM 8.6 (L) 11/20/2015 0424   ALKPHOS 49 11/19/2014 1440   AST 20 11/19/2014 1440   ALT 18 11/19/2014 1440   BILITOT 0.5 11/19/2014 1440         Impression and Plan:  75 year old woman with the following issues:  1. Microcytic anemia with a hemoglobin of 9.4, MCV of 74 and RDW of 16.9 noted in July 2016. She had a normal hemoglobin in July 2016 and subsequently underwent multiple orthopedic operations on her left knee including knee replacement, tenden repair and developed a hematoma but have contributed to her anemia and likely anemia of chronic blood loss. She has no GI blood losses and up-to-date on her colonoscopies.  Her hemoglobin is back to normal range at this time on oral iron supplements. Her iron stores have been repleted. I recommended decreasing her oral iron to once a day and then discontinue it after January 2017.   2. Cellulitis of the right lower extremity: This have resolved at this time she is no longer on antibiotics.  3. Screening colonoscopy: She is up-to-date at this time without evidence of GI bleeding.  4. Follow-up: As needed in the future.  Finnbar Cedillos, MD 9/7/201710:50 AM

## 2016-01-28 DIAGNOSIS — Z96651 Presence of right artificial knee joint: Secondary | ICD-10-CM | POA: Diagnosis not present

## 2016-01-28 DIAGNOSIS — Z471 Aftercare following joint replacement surgery: Secondary | ICD-10-CM | POA: Diagnosis not present

## 2016-02-07 DIAGNOSIS — Z23 Encounter for immunization: Secondary | ICD-10-CM | POA: Diagnosis not present

## 2016-03-13 DIAGNOSIS — M545 Low back pain: Secondary | ICD-10-CM | POA: Diagnosis not present

## 2016-03-13 DIAGNOSIS — Z6829 Body mass index (BMI) 29.0-29.9, adult: Secondary | ICD-10-CM | POA: Diagnosis not present

## 2016-04-16 DIAGNOSIS — E2839 Other primary ovarian failure: Secondary | ICD-10-CM | POA: Diagnosis not present

## 2016-04-16 DIAGNOSIS — E7800 Pure hypercholesterolemia, unspecified: Secondary | ICD-10-CM | POA: Diagnosis not present

## 2016-04-16 DIAGNOSIS — R194 Change in bowel habit: Secondary | ICD-10-CM | POA: Diagnosis not present

## 2016-04-16 DIAGNOSIS — D649 Anemia, unspecified: Secondary | ICD-10-CM | POA: Diagnosis not present

## 2016-04-16 DIAGNOSIS — G4730 Sleep apnea, unspecified: Secondary | ICD-10-CM | POA: Diagnosis not present

## 2016-04-16 DIAGNOSIS — Z0000 Encounter for general adult medical examination without abnormal findings: Secondary | ICD-10-CM | POA: Diagnosis not present

## 2016-04-16 DIAGNOSIS — E559 Vitamin D deficiency, unspecified: Secondary | ICD-10-CM | POA: Diagnosis not present

## 2016-04-16 DIAGNOSIS — Z6831 Body mass index (BMI) 31.0-31.9, adult: Secondary | ICD-10-CM | POA: Diagnosis not present

## 2016-04-16 DIAGNOSIS — Z79899 Other long term (current) drug therapy: Secondary | ICD-10-CM | POA: Diagnosis not present

## 2016-04-16 DIAGNOSIS — R030 Elevated blood-pressure reading, without diagnosis of hypertension: Secondary | ICD-10-CM | POA: Diagnosis not present

## 2016-04-29 DIAGNOSIS — Z96653 Presence of artificial knee joint, bilateral: Secondary | ICD-10-CM | POA: Diagnosis not present

## 2016-04-29 DIAGNOSIS — Z96651 Presence of right artificial knee joint: Secondary | ICD-10-CM | POA: Diagnosis not present

## 2016-04-29 DIAGNOSIS — Z471 Aftercare following joint replacement surgery: Secondary | ICD-10-CM | POA: Diagnosis not present

## 2016-04-29 DIAGNOSIS — Z96652 Presence of left artificial knee joint: Secondary | ICD-10-CM | POA: Diagnosis not present

## 2016-05-07 DIAGNOSIS — H35361 Drusen (degenerative) of macula, right eye: Secondary | ICD-10-CM | POA: Diagnosis not present

## 2016-05-07 DIAGNOSIS — H2513 Age-related nuclear cataract, bilateral: Secondary | ICD-10-CM | POA: Diagnosis not present

## 2016-05-07 DIAGNOSIS — H25013 Cortical age-related cataract, bilateral: Secondary | ICD-10-CM | POA: Diagnosis not present

## 2016-05-07 DIAGNOSIS — H40013 Open angle with borderline findings, low risk, bilateral: Secondary | ICD-10-CM | POA: Diagnosis not present

## 2016-05-18 IMAGING — CR DG KNEE COMPLETE 4+V*L*
4 series · 4 of 4 positions shown · non-contrast
Comparison: None.

CLINICAL DATA: Two weeks postop from left knee arthroplasty.
Worsening left knee pain, erythema, and swelling beginning today.

EXAM:
LEFT KNEE - COMPLETE 4+ VIEW

[t knee ap left]
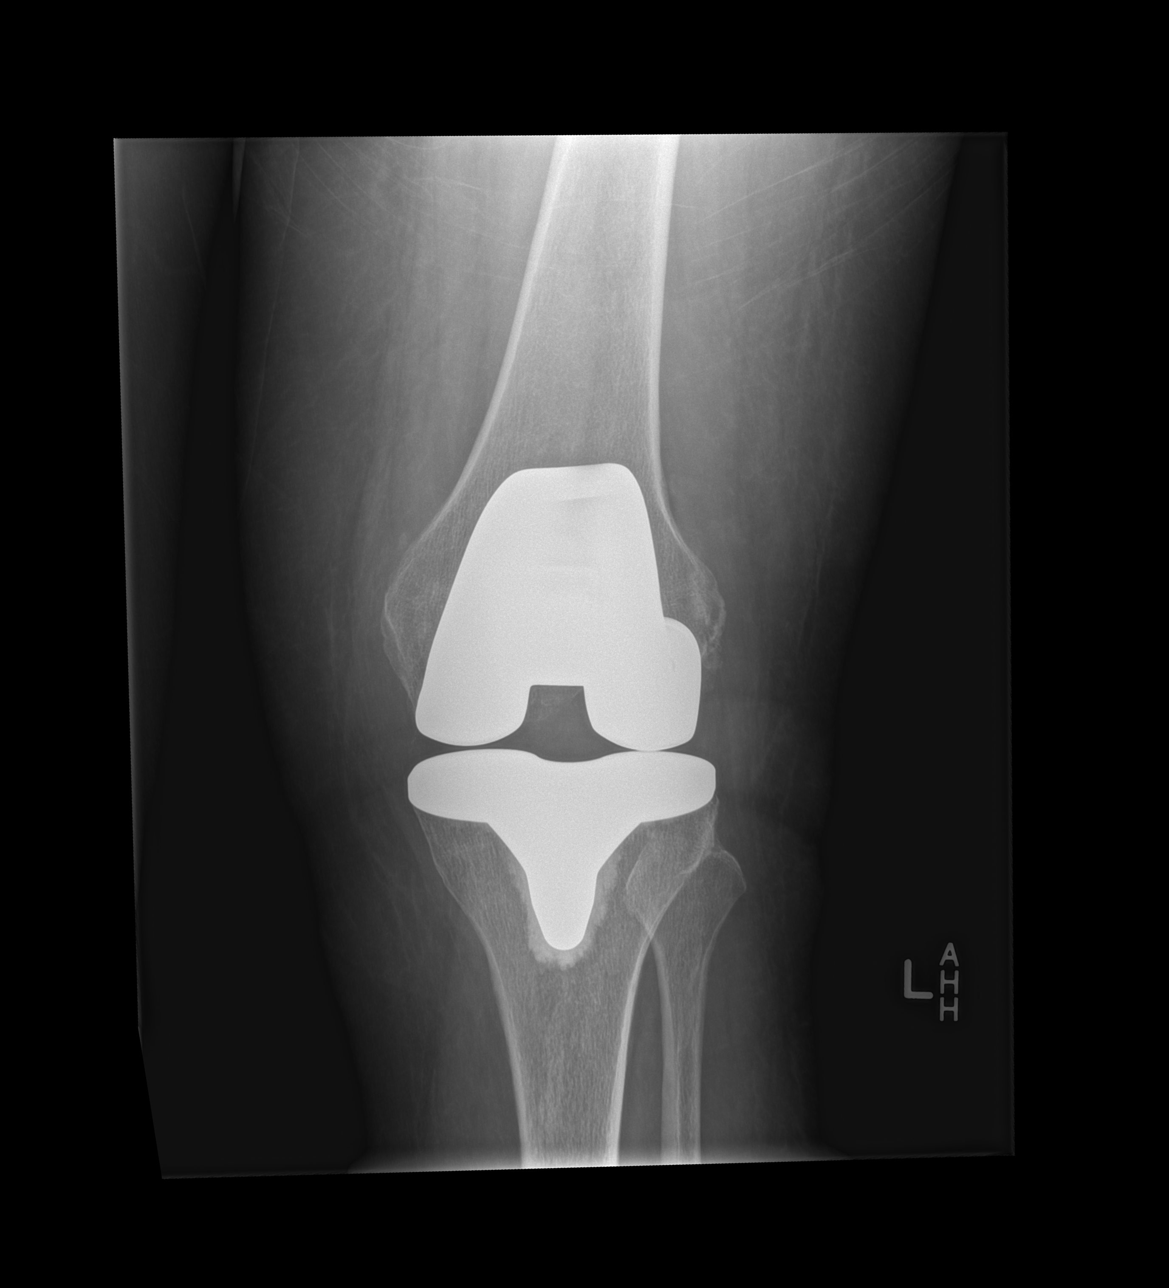

[t knee obl left (1 of 2)]
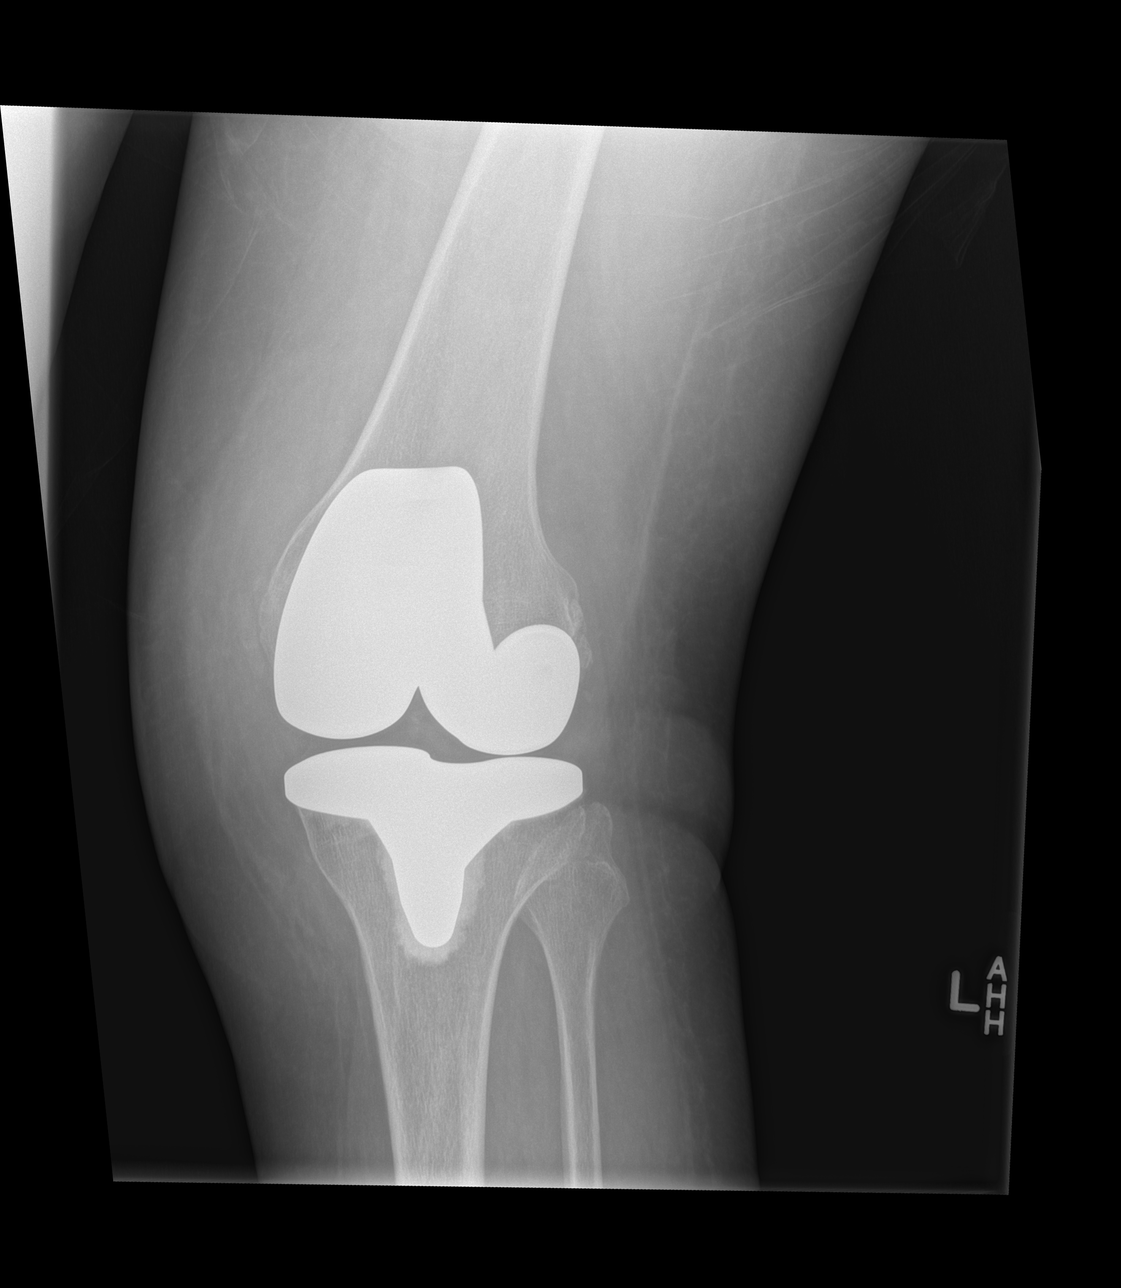

[t knee obl left (2 of 2)]
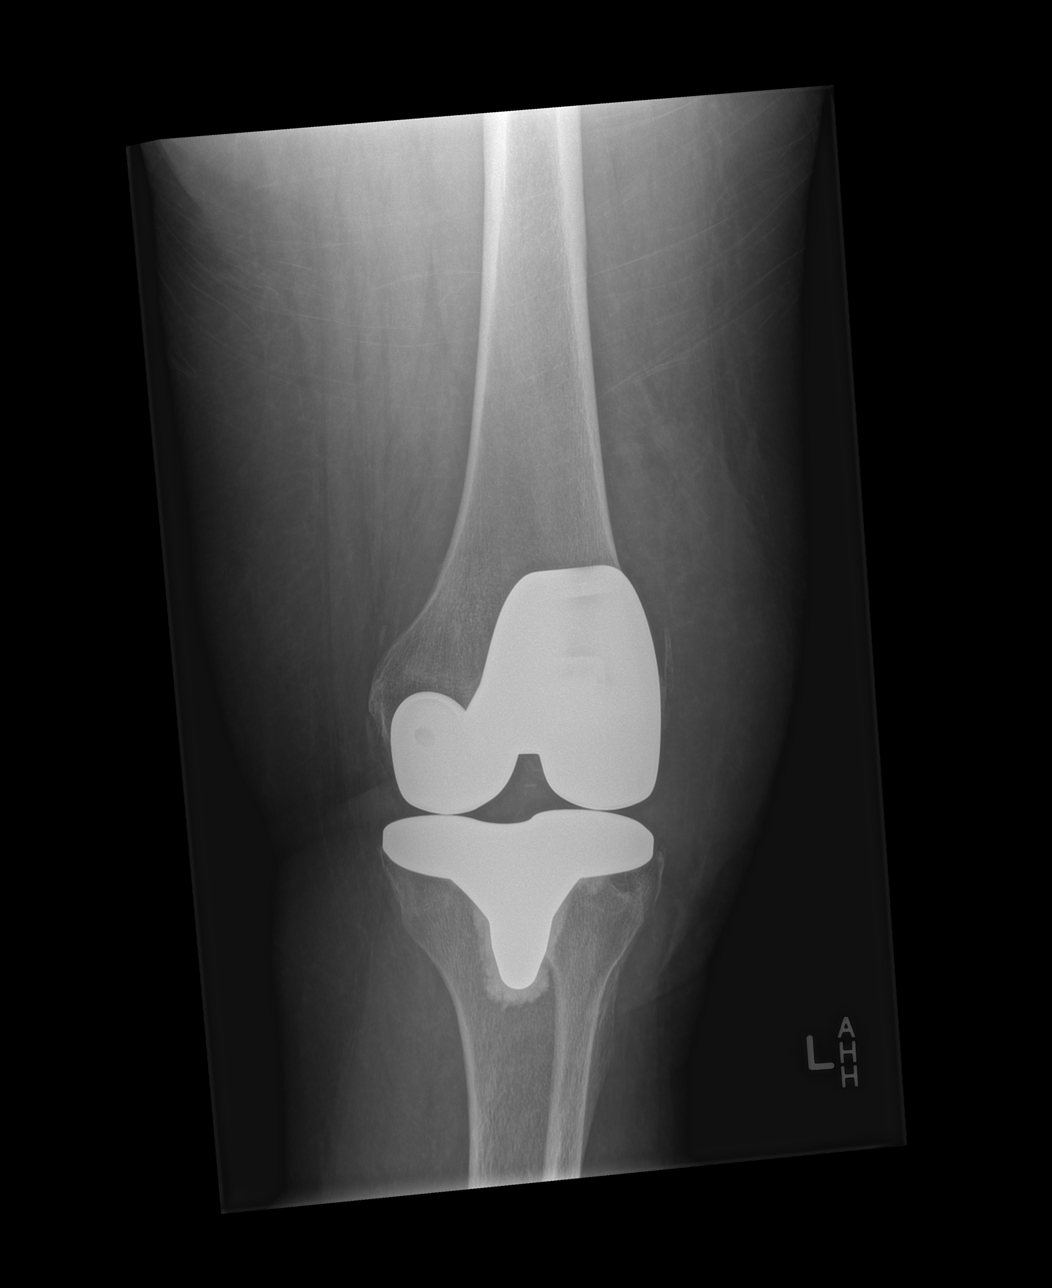

[x knee lat left]
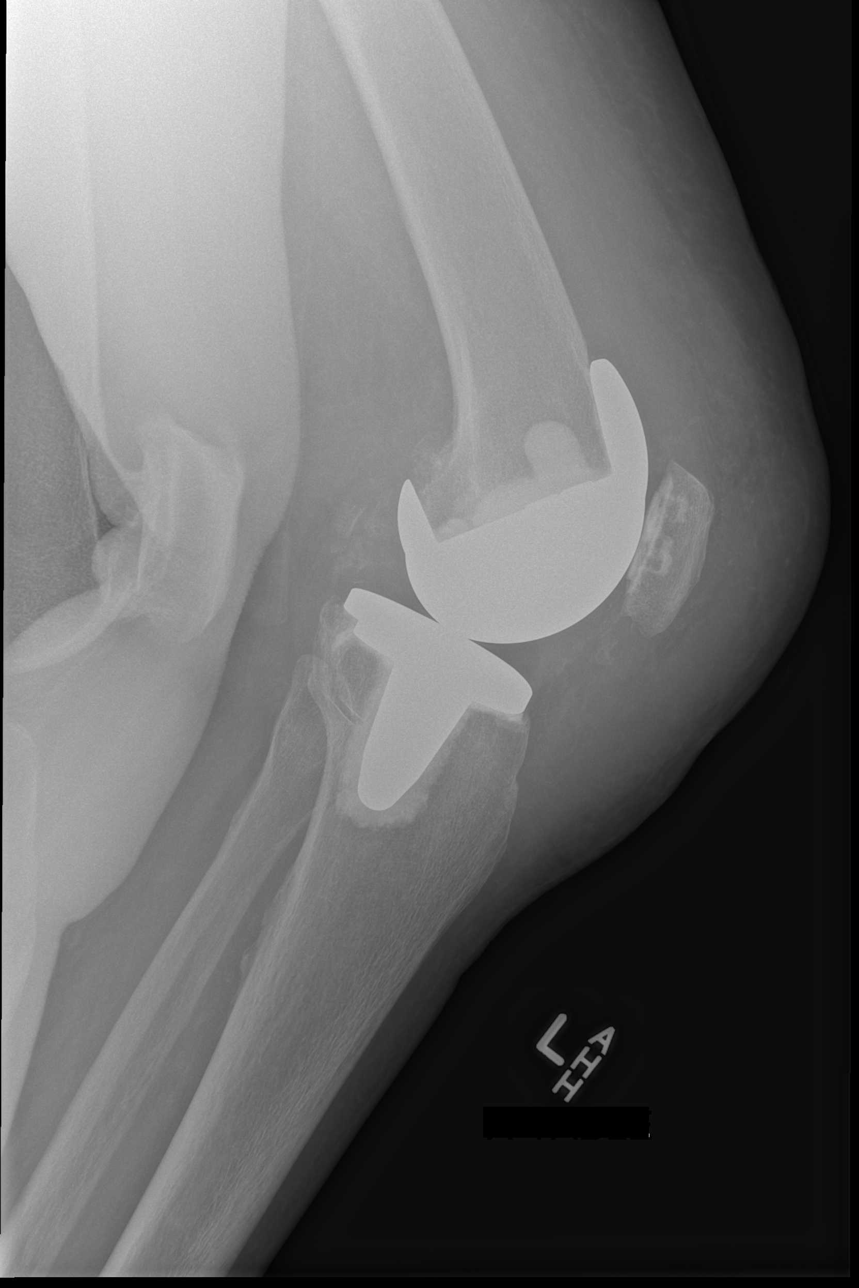

[4 of 4 positions shown; findings below may reference images not displayed]

FINDINGS: Prominent anterior soft tissue swelling is seen. No evidence of soft
tissue gas. No evidence of knee joint effusion.

Total knee arthroplasty is seen in appropriate position. No evidence
of fracture or dislocation. No other significant bone abnormality
identified.
IMPRESSION: Prominent anterior soft tissue swelling. No evidence of joint
effusion or osseous abnormality.

## 2016-06-01 DIAGNOSIS — E2839 Other primary ovarian failure: Secondary | ICD-10-CM | POA: Diagnosis not present

## 2016-06-01 DIAGNOSIS — M8588 Other specified disorders of bone density and structure, other site: Secondary | ICD-10-CM | POA: Diagnosis not present

## 2016-06-10 ENCOUNTER — Other Ambulatory Visit: Payer: Self-pay | Admitting: Family Medicine

## 2016-06-10 DIAGNOSIS — Z1231 Encounter for screening mammogram for malignant neoplasm of breast: Secondary | ICD-10-CM

## 2016-07-09 ENCOUNTER — Ambulatory Visit: Payer: Medicare Other

## 2016-07-23 ENCOUNTER — Ambulatory Visit
Admission: RE | Admit: 2016-07-23 | Discharge: 2016-07-23 | Disposition: A | Discharge status: Home / Self Care | Payer: Medicare Other | Source: Ambulatory Visit | Attending: Family Medicine | Admitting: Family Medicine

## 2016-07-23 DIAGNOSIS — Z1231 Encounter for screening mammogram for malignant neoplasm of breast: Secondary | ICD-10-CM | POA: Diagnosis not present

## 2016-07-24 ENCOUNTER — Other Ambulatory Visit: Payer: Self-pay | Admitting: Family Medicine

## 2016-07-24 DIAGNOSIS — R928 Other abnormal and inconclusive findings on diagnostic imaging of breast: Secondary | ICD-10-CM

## 2016-07-28 ENCOUNTER — Ambulatory Visit
Admission: RE | Admit: 2016-07-28 | Discharge: 2016-07-28 | Disposition: A | Discharge status: Home / Self Care | Payer: Medicare Other | Source: Ambulatory Visit | Attending: Family Medicine | Admitting: Family Medicine

## 2016-07-28 DIAGNOSIS — R928 Other abnormal and inconclusive findings on diagnostic imaging of breast: Secondary | ICD-10-CM

## 2016-07-28 DIAGNOSIS — R922 Inconclusive mammogram: Secondary | ICD-10-CM | POA: Diagnosis not present

## 2016-07-31 DIAGNOSIS — Z96653 Presence of artificial knee joint, bilateral: Secondary | ICD-10-CM | POA: Diagnosis not present

## 2016-07-31 DIAGNOSIS — Z471 Aftercare following joint replacement surgery: Secondary | ICD-10-CM | POA: Diagnosis not present

## 2016-08-06 DIAGNOSIS — N3281 Overactive bladder: Secondary | ICD-10-CM | POA: Diagnosis not present

## 2016-11-02 DIAGNOSIS — N390 Urinary tract infection, site not specified: Secondary | ICD-10-CM | POA: Diagnosis not present

## 2016-11-06 DIAGNOSIS — Z96652 Presence of left artificial knee joint: Secondary | ICD-10-CM | POA: Diagnosis not present

## 2016-11-06 DIAGNOSIS — M25662 Stiffness of left knee, not elsewhere classified: Secondary | ICD-10-CM | POA: Diagnosis not present

## 2016-11-06 DIAGNOSIS — M25661 Stiffness of right knee, not elsewhere classified: Secondary | ICD-10-CM | POA: Diagnosis not present

## 2016-11-06 DIAGNOSIS — Z471 Aftercare following joint replacement surgery: Secondary | ICD-10-CM | POA: Diagnosis not present

## 2016-11-06 DIAGNOSIS — Z96653 Presence of artificial knee joint, bilateral: Secondary | ICD-10-CM | POA: Diagnosis not present

## 2016-11-06 DIAGNOSIS — Z96651 Presence of right artificial knee joint: Secondary | ICD-10-CM | POA: Diagnosis not present

## 2016-12-01 DIAGNOSIS — N3000 Acute cystitis without hematuria: Secondary | ICD-10-CM | POA: Diagnosis not present

## 2017-01-18 DIAGNOSIS — Z96652 Presence of left artificial knee joint: Secondary | ICD-10-CM | POA: Diagnosis not present

## 2017-01-18 DIAGNOSIS — M170 Bilateral primary osteoarthritis of knee: Secondary | ICD-10-CM | POA: Diagnosis not present

## 2017-01-18 DIAGNOSIS — M1712 Unilateral primary osteoarthritis, left knee: Secondary | ICD-10-CM | POA: Diagnosis not present

## 2017-01-21 DIAGNOSIS — M1712 Unilateral primary osteoarthritis, left knee: Secondary | ICD-10-CM | POA: Diagnosis not present

## 2017-01-22 ENCOUNTER — Encounter (HOSPITAL_COMMUNITY): Payer: Self-pay | Admitting: *Deleted

## 2017-01-24 NOTE — H&P (Signed)
CC- Lauren English is a 76 y.o. female who presents with left knee swelling.  HPI- . Knee Pain: Patient presents with a draining wound  involving the  left knee. Onset of the symptoms was several days ago. Inciting event: none known. Current symptoms include drainage from a swollen area inferolateral aspect of left knee. It developed spontaneously last week but she has a history of wound problems with this knee 2 years ago after her knee replacement.. Patient has had prior knee problems.Treatment to date: 5 days of antibiotics without resolution of the problem .  Past Medical History:  Diagnosis Date  . Anemia   . Arthritis    "qwhere; feet, knees, neck" (04/21/2012)  . Bladder incontinence   . Chronic back pain   . Closed angle glaucoma    "both eyes" (04/21/2012)  . Difficult intubation    with intubation on November 26, 2014  . Numbness    fingertips bilat and feet bilat   . OSA on CPAP   . Overactive bladder   . Peripheral neuropathy    feet bilat   . Pneumonia    hx. of as child  . PONV (postoperative nausea and vomiting)   . Prediabetes   . Urinary tract bacterial infections     Past Surgical History:  Procedure Laterality Date  . ABDOMINAL HYSTERECTOMY  1980   "partial" (04/21/2012)  . ANTERIOR FUSION CERVICAL SPINE  12/2005  . APPENDECTOMY    . BACK SURGERY    . BLADDER SUSPENSION  1990  . BREAST EXCISIONAL BIOPSY Bilateral    Benign  . DILATION AND CURETTAGE OF UTERUS  1980  . KNEE ARTHROPLASTY  06/2008   "slipped on ice; torn ligaments & cartilage" (04/21/2012)  . KNEE CLOSED REDUCTION Left 03/25/2015   Procedure: CLOSED MANIPULATION LEFT KNEE;  Surgeon: Ollen Gross, MD;  Location: [facility];  Service: Orthopedics;  Laterality: Left;  . KNEE CLOSED REDUCTION Left 11/18/2015   Procedure: LEFT KNEE CLOSED MANIPULATION;  Surgeon: Ollen Gross, MD;  Location: [facility];  Service: Orthopedics;  Laterality: Left;  . PATELLAR TENDON REPAIR Left 12/10/2014   Procedure: REPAIR  PARTIAL QUAD TENDON TEAR;  Surgeon: Ollen Gross, MD;  Location: [facility];  Service: Orthopedics;  Laterality: Left;  . POSTERIOR LUMBAR FUSION  04/21/2012  . TONSILLECTOMY AND ADENOIDECTOMY  ~ 1950  . TOTAL KNEE ARTHROPLASTY Left 11/26/2014   Procedure: TOTAL LEFT   KNEE ARTHROPLASTY;  Surgeon: Ollen Gross, MD;  Location: [facility];  Service: Orthopedics;  Laterality: Left;  . TOTAL KNEE ARTHROPLASTY Right 11/18/2015   Procedure: RIGHT TOTAL KNEE ARTHROPLASTY;  Surgeon: Ollen Gross, MD;  Location: [facility];  Service: Orthopedics;  Laterality: Right;  . TUBAL LIGATION  1970's    Prior to Admission medications   Medication Sig Start Date End Date Taking? Authorizing Provider  Acetylcarnitine HCl (ACETYL L-CARNITINE) 500 MG CAPS Take 500 mg by mouth daily.   Yes [provider]  calcium carbonate (OSCAL) 1500 (600 Ca) MG TABS tablet Take 750 mg by mouth 2 (two) times daily with a meal.   Yes [provider]  Cholecalciferol (VITAMIN D3) 2000 units TABS Take 2,000 mg by mouth daily.   Yes [provider]  ciprofloxacin (CIPRO) 500 MG tablet Take 500 mg by mouth 2 (two) times daily.   Yes [provider]  docusate sodium (COLACE) 100 MG capsule Take 100 mg by mouth 2 (two) times daily.   Yes [provider]  ibuprofen (ADVIL,MOTRIN) 200 MG  tablet Take 800 mg by mouth every 6 (six) hours as needed for mild pain.   Yes [provider]  MYRBETRIQ 50 MG TB24 tablet Take 50 mg by mouth daily. 10/02/15  Yes [provider]  Omega-3 1000 MG CAPS Take 1,000 mg by mouth 2 (two) times daily.   Yes [provider]  polyvinyl alcohol-povidone (REFRESH) 1.4-0.6 % ophthalmic solution Place 1 drop into both eyes daily.   Yes [provider]  TURMERIC CURCUMIN PO Take 1 capsule by mouth daily.   Yes [provider]  aspirin 325 MG EC tablet Take 1 tablet (325 mg total) by mouth daily with breakfast. Take 325 mg Aspirin daily for  three weeks following discharge. Once the patient has completed the 325 mg Aspirin, then take a Baby 81 mg Aspirin daily for three more weeks. Patient not taking: Reported on 01/22/2017 11/20/15   Julien Girt, Alexzandrew L, PA-C  HYDROcodone-acetaminophen (NORCO/VICODIN) 5-325 MG tablet Take 1-2 tablets by mouth every 4 (four) hours as needed for moderate pain. Patient not taking: Reported on 01/16/2016 11/20/15   Julien Girt, Alexzandrew L, PA-C  HYDROmorphone (DILAUDID) 2 MG tablet Take 1-2 tablets (2-4 mg total) by mouth every 4 (four) hours as needed for moderate pain or severe pain. Patient not taking: Reported on 01/16/2016 11/19/15   Julien Girt, Alexzandrew L, PA-C  methocarbamol (ROBAXIN) 500 MG tablet Take 1 tablet (500 mg total) by mouth every 6 (six) hours as needed for muscle spasms. Patient not taking: Reported on 01/16/2016 11/20/15   Julien Girt, Alexzandrew L, PA-C  traMADol (ULTRAM) 50 MG tablet Take 1-2 tablets (50-100 mg total) by mouth every 6 (six) hours as needed (mild pain). Patient not taking: Reported on 01/16/2016 11/20/15   Perkins, Alexzandrew L, PA-C    KNEE EXAM No warmth, erythema or effusion. 1 x 2 cm swollen area inferolateral knee with tiny amount of clear drainage  Physical Examination: General appearance - alert, well appearing, and in no distress Mental status - alert, oriented to person, place, and time Chest - clear to auscultation, no wheezes, rales or rhonchi, symmetric air entry Heart - normal rate, regular rhythm, normal S1, S2, no murmurs, rubs, clicks or gallops Abdomen - soft, nontender, nondistended, no masses or organomegaly Neurological - alert, oriented, normal speech, no focal findings or movement disorder noted   Asessment/Plan--- Left knee  Spontaneous wound drainage- - This is very concerning for infection. Will plan on irrigation and debridement. Procedure risks and potential comps discussed with patient who elects to proceed.

## 2017-01-25 ENCOUNTER — Encounter (HOSPITAL_COMMUNITY)
Admission: RE | Disposition: A | Discharge status: Home / Self Care | Payer: Self-pay | Source: Ambulatory Visit | Attending: Orthopedic Surgery

## 2017-01-25 ENCOUNTER — Ambulatory Visit: Payer: Self-pay | Admitting: Orthopedic Surgery

## 2017-01-25 ENCOUNTER — Ambulatory Visit (HOSPITAL_COMMUNITY): Payer: Medicare Other | Admitting: Anesthesiology

## 2017-01-25 ENCOUNTER — Observation Stay (HOSPITAL_COMMUNITY)
Admission: RE | Admit: 2017-01-25 | Discharge: 2017-01-26 | Disposition: A | Discharge status: Home / Self Care | Payer: Medicare Other | Source: Ambulatory Visit | Attending: Orthopedic Surgery | Admitting: Orthopedic Surgery

## 2017-01-25 ENCOUNTER — Encounter (HOSPITAL_COMMUNITY): Payer: Self-pay

## 2017-01-25 DIAGNOSIS — L03116 Cellulitis of left lower limb: Secondary | ICD-10-CM | POA: Diagnosis not present

## 2017-01-25 DIAGNOSIS — Z79899 Other long term (current) drug therapy: Secondary | ICD-10-CM | POA: Diagnosis not present

## 2017-01-25 DIAGNOSIS — G4733 Obstructive sleep apnea (adult) (pediatric): Secondary | ICD-10-CM | POA: Diagnosis not present

## 2017-01-25 DIAGNOSIS — Z96653 Presence of artificial knee joint, bilateral: Secondary | ICD-10-CM | POA: Diagnosis not present

## 2017-01-25 DIAGNOSIS — L02416 Cutaneous abscess of left lower limb: Secondary | ICD-10-CM | POA: Diagnosis not present

## 2017-01-25 DIAGNOSIS — Z7982 Long term (current) use of aspirin: Secondary | ICD-10-CM | POA: Insufficient documentation

## 2017-01-25 DIAGNOSIS — M65062 Abscess of tendon sheath, left lower leg: Secondary | ICD-10-CM | POA: Diagnosis not present

## 2017-01-25 DIAGNOSIS — Z96652 Presence of left artificial knee joint: Secondary | ICD-10-CM | POA: Diagnosis not present

## 2017-01-25 HISTORY — PX: IRRIGATION AND DEBRIDEMENT KNEE: SHX5185

## 2017-01-25 LAB — POCT I-STAT 4, (NA,K, GLUC, HGB,HCT)
Glucose, Bld: 93 mg/dL (ref 65–99)
HEMATOCRIT: 39 % (ref 36.0–46.0)
Hemoglobin: 13.3 g/dL (ref 12.0–15.0)
Potassium: 3.7 mmol/L (ref 3.5–5.1)
Sodium: 142 mmol/L (ref 135–145)

## 2017-01-25 SURGERY — IRRIGATION AND DEBRIDEMENT KNEE
Anesthesia: General | Site: Knee | Laterality: Left

## 2017-01-25 MED ORDER — ONDANSETRON HCL 4 MG/2ML IJ SOLN
INTRAMUSCULAR | Status: AC
  Filled 2017-01-25: qty 2

## 2017-01-25 MED ORDER — DEXAMETHASONE SODIUM PHOSPHATE 10 MG/ML IJ SOLN
INTRAMUSCULAR | Status: AC
  Filled 2017-01-25: qty 1

## 2017-01-25 MED ORDER — CHLORHEXIDINE GLUCONATE 4 % EX LIQD: 60.0000 mL | Freq: Once | CUTANEOUS | Status: DC

## 2017-01-25 MED ORDER — DEXTROSE 5 % IV SOLN
500.0000 mg | Freq: Four times a day (QID) | INTRAVENOUS | Status: DC | PRN
  Administered 2017-01-25: 500 mg via INTRAVENOUS
  Filled 2017-01-25: qty 550

## 2017-01-25 MED ORDER — METOCLOPRAMIDE HCL 5 MG/ML IJ SOLN: 5.0000 mg | Freq: Three times a day (TID) | INTRAMUSCULAR | Status: DC | PRN

## 2017-01-25 MED ORDER — SODIUM CHLORIDE 0.9 % IR SOLN
Status: DC | PRN
  Administered 2017-01-25: 3000 mL

## 2017-01-25 MED ORDER — ACETAMINOPHEN 325 MG PO TABS: 650.0000 mg | ORAL_TABLET | Freq: Four times a day (QID) | ORAL | Status: DC | PRN

## 2017-01-25 MED ORDER — ACETAMINOPHEN 650 MG RE SUPP: 650.0000 mg | Freq: Four times a day (QID) | RECTAL | Status: DC | PRN

## 2017-01-25 MED ORDER — ACETAMINOPHEN 10 MG/ML IV SOLN
INTRAVENOUS | Status: AC
  Filled 2017-01-25: qty 100

## 2017-01-25 MED ORDER — HYDROCODONE-ACETAMINOPHEN 5-325 MG PO TABS
1.0000 | ORAL_TABLET | ORAL | Status: DC | PRN
  Administered 2017-01-25 – 2017-01-26 (×2): 1 via ORAL
  Filled 2017-01-25 (×2): qty 1

## 2017-01-25 MED ORDER — CEFAZOLIN SODIUM-DEXTROSE 2-3 GM-% IV SOLR
INTRAVENOUS | Status: DC | PRN
  Administered 2017-01-25: 2 g via INTRAVENOUS

## 2017-01-25 MED ORDER — SODIUM CHLORIDE 0.9 % IV SOLN
INTRAVENOUS | Status: DC
  Administered 2017-01-25: 19:00:00 via INTRAVENOUS

## 2017-01-25 MED ORDER — DEXAMETHASONE SODIUM PHOSPHATE 10 MG/ML IJ SOLN
10.0000 mg | Freq: Once | INTRAMUSCULAR | Status: AC
  Administered 2017-01-25: 10 mg via INTRAVENOUS

## 2017-01-25 MED ORDER — CEFAZOLIN SODIUM-DEXTROSE 2-4 GM/100ML-% IV SOLN
INTRAVENOUS | Status: AC
  Filled 2017-01-25: qty 100

## 2017-01-25 MED ORDER — LACTATED RINGERS IV SOLN
INTRAVENOUS | Status: DC
  Administered 2017-01-25: 1000 mL via INTRAVENOUS

## 2017-01-25 MED ORDER — FENTANYL CITRATE (PF) 100 MCG/2ML IJ SOLN
INTRAMUSCULAR | Status: DC | PRN
  Administered 2017-01-25 (×2): 50 ug via INTRAVENOUS

## 2017-01-25 MED ORDER — ONDANSETRON HCL 4 MG PO TABS: 4.0000 mg | ORAL_TABLET | Freq: Four times a day (QID) | ORAL | Status: DC | PRN

## 2017-01-25 MED ORDER — HYDROMORPHONE HCL-NACL 0.5-0.9 MG/ML-% IV SOSY
PREFILLED_SYRINGE | INTRAVENOUS | Status: AC
  Filled 2017-01-25: qty 1

## 2017-01-25 MED ORDER — SCOPOLAMINE 1 MG/3DAYS TD PT72
MEDICATED_PATCH | TRANSDERMAL | Status: AC
  Filled 2017-01-25: qty 1

## 2017-01-25 MED ORDER — METOCLOPRAMIDE HCL 5 MG PO TABS: 5.0000 mg | ORAL_TABLET | Freq: Three times a day (TID) | ORAL | Status: DC | PRN

## 2017-01-25 MED ORDER — OXYCODONE HCL 5 MG PO TABS: 5.0000 mg | ORAL_TABLET | Freq: Once | ORAL | Status: DC | PRN

## 2017-01-25 MED ORDER — ONDANSETRON HCL 4 MG/2ML IJ SOLN
INTRAMUSCULAR | Status: DC | PRN
  Administered 2017-01-25: 4 mg via INTRAVENOUS

## 2017-01-25 MED ORDER — POVIDONE-IODINE 10 % EX SWAB
2.0000 "application " | Freq: Once | CUTANEOUS | Status: AC
  Administered 2017-01-25: 2 via TOPICAL

## 2017-01-25 MED ORDER — ACETAMINOPHEN 10 MG/ML IV SOLN
1000.0000 mg | Freq: Once | INTRAVENOUS | Status: AC
  Administered 2017-01-25: 1000 mg via INTRAVENOUS

## 2017-01-25 MED ORDER — TRAMADOL HCL 50 MG PO TABS: 50.0000 mg | ORAL_TABLET | Freq: Four times a day (QID) | ORAL | Status: DC | PRN

## 2017-01-25 MED ORDER — ACETYL L-CARNITINE 500 MG PO CAPS: 500.0000 mg | ORAL_CAPSULE | Freq: Every day | ORAL | Status: DC

## 2017-01-25 MED ORDER — SUCCINYLCHOLINE CHLORIDE 200 MG/10ML IV SOSY
PREFILLED_SYRINGE | INTRAVENOUS | Status: AC
  Filled 2017-01-25: qty 10

## 2017-01-25 MED ORDER — HYDROMORPHONE HCL-NACL 0.5-0.9 MG/ML-% IV SOSY
0.2500 mg | PREFILLED_SYRINGE | INTRAVENOUS | Status: DC | PRN
  Administered 2017-01-25 (×3): 0.5 mg via INTRAVENOUS

## 2017-01-25 MED ORDER — 0.9 % SODIUM CHLORIDE (POUR BTL) OPTIME
TOPICAL | Status: DC | PRN
  Administered 2017-01-25: 1000 mL

## 2017-01-25 MED ORDER — BUPIVACAINE HCL 0.25 % IJ SOLN
INTRAMUSCULAR | Status: AC
  Filled 2017-01-25: qty 1

## 2017-01-25 MED ORDER — PROPOFOL 10 MG/ML IV BOLUS
INTRAVENOUS | Status: DC | PRN
  Administered 2017-01-25: 50 mg via INTRAVENOUS
  Administered 2017-01-25: 150 mg via INTRAVENOUS

## 2017-01-25 MED ORDER — FENTANYL CITRATE (PF) 100 MCG/2ML IJ SOLN
INTRAMUSCULAR | Status: AC
  Filled 2017-01-25: qty 2

## 2017-01-25 MED ORDER — METHOCARBAMOL 500 MG PO TABS
500.0000 mg | ORAL_TABLET | Freq: Four times a day (QID) | ORAL | Status: DC | PRN
  Administered 2017-01-25 – 2017-01-26 (×2): 500 mg via ORAL
  Filled 2017-01-25 (×2): qty 1

## 2017-01-25 MED ORDER — MORPHINE SULFATE (PF) 4 MG/ML IV SOLN
1.0000 mg | INTRAVENOUS | Status: DC | PRN
  Filled 2017-01-25: qty 1

## 2017-01-25 MED ORDER — MIRABEGRON ER 25 MG PO TB24
50.0000 mg | ORAL_TABLET | Freq: Every day | ORAL | Status: DC
  Administered 2017-01-26: 50 mg via ORAL
  Filled 2017-01-25: qty 2

## 2017-01-25 MED ORDER — LIDOCAINE HCL (CARDIAC) 20 MG/ML IV SOLN
INTRAVENOUS | Status: DC | PRN
  Administered 2017-01-25: 60 mg via INTRAVENOUS

## 2017-01-25 MED ORDER — OXYCODONE HCL 5 MG/5ML PO SOLN: 5.0000 mg | Freq: Once | ORAL | Status: DC | PRN

## 2017-01-25 MED ORDER — PROPOFOL 10 MG/ML IV BOLUS
INTRAVENOUS | Status: AC
  Filled 2017-01-25: qty 20

## 2017-01-25 MED ORDER — HYDROMORPHONE HCL-NACL 0.5-0.9 MG/ML-% IV SOSY
PREFILLED_SYRINGE | INTRAVENOUS | Status: AC
  Filled 2017-01-25: qty 3

## 2017-01-25 MED ORDER — LIDOCAINE 2% (20 MG/ML) 5 ML SYRINGE
INTRAMUSCULAR | Status: AC
  Filled 2017-01-25: qty 5

## 2017-01-25 MED ORDER — ONDANSETRON HCL 4 MG/2ML IJ SOLN
4.0000 mg | Freq: Four times a day (QID) | INTRAMUSCULAR | Status: DC | PRN
  Administered 2017-01-25: 4 mg via INTRAVENOUS
  Filled 2017-01-25: qty 2

## 2017-01-25 MED ORDER — CEFAZOLIN SODIUM-DEXTROSE 2-4 GM/100ML-% IV SOLN
2.0000 g | Freq: Four times a day (QID) | INTRAVENOUS | Status: AC
  Administered 2017-01-25 – 2017-01-26 (×3): 2 g via INTRAVENOUS
  Filled 2017-01-25 (×3): qty 100

## 2017-01-25 SURGICAL SUPPLY — 42 items
BAG ZIPLOCK 12X15 (MISCELLANEOUS) ×2 IMPLANT
BANDAGE ACE 6X5 VEL STRL LF (GAUZE/BANDAGES/DRESSINGS) ×2 IMPLANT
BANDAGE ESMARK 6X9 LF (GAUZE/BANDAGES/DRESSINGS) ×1 IMPLANT
BNDG ESMARK 6X9 LF (GAUZE/BANDAGES/DRESSINGS) ×2
COVER SURGICAL LIGHT HANDLE (MISCELLANEOUS) ×2 IMPLANT
CUFF TOURN SGL QUICK 34 (TOURNIQUET CUFF) ×1
CUFF TRNQT CYL 34X4X40X1 (TOURNIQUET CUFF) ×1 IMPLANT
DRAPE EXTREMITY T 121X128X90 (DRAPE) ×2 IMPLANT
DRAPE POUCH INSTRU U-SHP 10X18 (DRAPES) ×2 IMPLANT
DRAPE U-SHAPE 47X51 STRL (DRAPES) ×2 IMPLANT
DRSG ADAPTIC 3X8 NADH LF (GAUZE/BANDAGES/DRESSINGS) ×2 IMPLANT
DURAPREP 26ML APPLICATOR (WOUND CARE) ×2 IMPLANT
ELECT REM PT RETURN 15FT ADLT (MISCELLANEOUS) ×2 IMPLANT
EVACUATOR 1/8 PVC DRAIN (DRAIN) ×2 IMPLANT
GAUZE SPONGE 4X4 12PLY STRL (GAUZE/BANDAGES/DRESSINGS) ×2 IMPLANT
GLOVE BIO SURGEON STRL SZ7.5 (GLOVE) ×2 IMPLANT
GLOVE BIO SURGEON STRL SZ8 (GLOVE) ×4 IMPLANT
GLOVE BIOGEL PI IND STRL 7.5 (GLOVE) ×1 IMPLANT
GLOVE BIOGEL PI IND STRL 8 (GLOVE) ×1 IMPLANT
GLOVE BIOGEL PI INDICATOR 7.5 (GLOVE) ×1
GLOVE BIOGEL PI INDICATOR 8 (GLOVE) ×1
GLOVE SURG SS PI 7.5 STRL IVOR (GLOVE) ×2 IMPLANT
GOWN STRL REUS W/ TWL XL LVL3 (GOWN DISPOSABLE) ×1 IMPLANT
GOWN STRL REUS W/TWL LRG LVL3 (GOWN DISPOSABLE) ×2 IMPLANT
GOWN STRL REUS W/TWL XL LVL3 (GOWN DISPOSABLE) ×1
HANDPIECE INTERPULSE COAX TIP (DISPOSABLE) ×1
KIT BASIN OR (CUSTOM PROCEDURE TRAY) ×2 IMPLANT
MANIFOLD NEPTUNE II (INSTRUMENTS) ×2 IMPLANT
PACK TOTAL JOINT (CUSTOM PROCEDURE TRAY) ×2 IMPLANT
PAD ABD 8X10 STRL (GAUZE/BANDAGES/DRESSINGS) ×2 IMPLANT
PADDING CAST COTTON 6X4 STRL (CAST SUPPLIES) ×2 IMPLANT
POSITIONER SURGICAL ARM (MISCELLANEOUS) ×2 IMPLANT
SET HNDPC FAN SPRY TIP SCT (DISPOSABLE) ×1 IMPLANT
STAPLER VISISTAT 35W (STAPLE) ×2 IMPLANT
STRIP CLOSURE SKIN 1/2X4 (GAUZE/BANDAGES/DRESSINGS) ×2 IMPLANT
SUT MNCRL AB 4-0 PS2 18 (SUTURE) ×2 IMPLANT
SUT VIC AB 2-0 CT1 27 (SUTURE) ×3
SUT VIC AB 2-0 CT1 TAPERPNT 27 (SUTURE) ×3 IMPLANT
SUT VLOC 180 0 24IN GS25 (SUTURE) ×2 IMPLANT
SWAB COLLECTION DEVICE MRSA (MISCELLANEOUS) ×2 IMPLANT
SWAB CULTURE ESWAB REG 1ML (MISCELLANEOUS) ×2 IMPLANT
TOWEL OR 17X26 10 PK STRL BLUE (TOWEL DISPOSABLE) ×4 IMPLANT

## 2017-01-25 NOTE — Progress Notes (Signed)
PHARMACIST - PHYSICIAN ORDER COMMUNICATION  CONCERNING: P&T Medication Policy on Herbal Medications  DESCRIPTION:  This patient's order for:  Acetyl L-Carnitine  has been noted.  This product(s) is classified as an "herbal" or natural product. Due to a lack of definitive safety studies or FDA approval, nonstandard manufacturing practices, plus the potential risk of unknown drug-drug interactions while on inpatient medications, the Pharmacy and Therapeutics Committee does not permit the use of "herbal" or natural products of this type within [facility].   ACTION TAKEN: The pharmacy department is unable to verify this order at this time and your patient has been informed of this safety policy. Please reevaluate patient's clinical condition at discharge and address if the herbal or natural product(s) should be resumed at that time.   

## 2017-01-25 NOTE — Brief Op Note (Signed)
01/25/2017  3:08 PM  PATIENT:  Lauren English  76 y.o. female  PRE-OPERATIVE DIAGNOSIS:  Left knee abscess  POST-OPERATIVE DIAGNOSIS:  Left knee abscess  PROCEDURE:  Procedure(s): IRRIGATION AND DEBRIDEMENT KNEE (Left)  SURGEON:  Surgeon(s) and Role:    Ollen Gross, MD - Primary  PHYSICIAN ASSISTANT:   ASSISTANTS: none   ANESTHESIA:   general  EBL:  Total I/O In: 800 [I.V.:800] Out: 10 [Blood:10]  BLOOD ADMINISTERED:none  DRAINS: (Medium) Hemovact drain(s) in the left lnee with  Suction Open   LOCAL MEDICATIONS USED:  NONE  COUNTS:  YES  TOURNIQUET:   Total Tourniquet Time Documented: Thigh (Left) - 16 minutes Total: Thigh (Left) - 16 minutes   DICTATION: .Other Dictation: Dictation Number [id number]  PLAN OF CARE: Admit for overnight observation  PATIENT DISPOSITION:  PACU - hemodynamically stable.

## 2017-01-25 NOTE — Transfer of Care (Signed)
Immediate Anesthesia Transfer of Care Note  Patient: Lauren English  Procedure(s) Performed: Procedure(s): IRRIGATION AND DEBRIDEMENT KNEE (Left)  Patient Location: PACU  Anesthesia Type:General  Level of Consciousness: awake, alert  and oriented  Airway & Oxygen Therapy: Patient Spontanous Breathing and Patient connected to face mask oxygen  Post-op Assessment: Report given to RN and Post -op Vital signs reviewed and stable  Post vital signs: Reviewed and stable  Last Vitals:  Vitals:   01/25/17 1218  BP: (!) 162/67  Pulse: 78  Resp: 16  Temp: 36.7 C  SpO2: 97%    Last Pain:  Vitals:   01/25/17 1218  TempSrc: Oral      Patients Stated Pain Goal: 4 (01/25/17 1228)  Complications: No apparent anesthesia complications

## 2017-01-25 NOTE — Anesthesia Procedure Notes (Signed)
Procedure Name: LMA Insertion Date/Time: 01/25/2017 2:09 PM Performed by: Thornell Mule Pre-anesthesia Checklist: Patient identified, Emergency Drugs available, Suction available and Patient being monitored Patient Re-evaluated:Patient Re-evaluated prior to induction Oxygen Delivery Method: Circle system utilized Preoxygenation: Pre-oxygenation with 100% oxygen Induction Type: IV induction LMA: LMA inserted LMA Size: 4.0 Number of attempts: 1 Placement Confirmation: positive ETCO2 and breath sounds checked- equal and bilateral Tube secured with: Tape Dental Injury: Teeth and Oropharynx as per pre-operative assessment

## 2017-01-25 NOTE — Anesthesia Preprocedure Evaluation (Addendum)
Anesthesia Evaluation  Patient identified by MRN, date of birth, ID band Patient awake    Reviewed: Allergy & Precautions  History of Anesthesia Complications (+) PONV, DIFFICULT AIRWAY and history of anesthetic complications  Airway Mallampati: II  TM Distance: <3 FB Neck ROM: Full   Comment: Hx of difficult airway Dental no notable dental hx.    Pulmonary sleep apnea ,    breath sounds clear to auscultation       Cardiovascular negative cardio ROS   Rhythm:Regular Rate:Normal     Neuro/Psych    GI/Hepatic negative GI ROS, Neg liver ROS,   Endo/Other  negative endocrine ROS  Renal/GU negative Renal ROS     Musculoskeletal   Abdominal   Peds  Hematology negative hematology ROS (+)   Anesthesia Other Findings   Reproductive/Obstetrics                             Anesthesia Physical Anesthesia Plan  ASA: II  Anesthesia Plan: General   Post-op Pain Management:    Induction: Intravenous  PONV Risk Score and Plan: 4 or greater and Ondansetron, Dexamethasone, Midazolam, Scopolamine patch - Pre-op and Treatment may vary due to age or medical condition  Airway Management Planned: Oral ETT and Video Laryngoscope Planned  Additional Equipment:   Intra-op Plan:   Post-operative Plan: Extubation in OR  Informed Consent: I have reviewed the patients History and Physical, chart, labs and discussed the procedure including the risks, benefits and alternatives for the proposed anesthesia with the patient or authorized representative who has indicated his/her understanding and acceptance.   Dental advisory given  Plan Discussed with: CRNA  Anesthesia Plan Comments:         Anesthesia Quick Evaluation

## 2017-01-25 NOTE — Anesthesia Postprocedure Evaluation (Signed)
Anesthesia Post Note  Patient: Lauren English  Procedure(s) Performed: Procedure(s) (LRB): IRRIGATION AND DEBRIDEMENT KNEE (Left)     Patient location during evaluation: PACU Anesthesia Type: General Level of consciousness: awake Pain management: pain level controlled Vital Signs Assessment: post-procedure vital signs reviewed and stable Respiratory status: spontaneous breathing Cardiovascular status: stable Anesthetic complications: no    Last Vitals:  Vitals:   01/25/17 1515 01/25/17 1530  BP: 133/67 131/60  Pulse: 69 67  Resp: 10 14  Temp:    SpO2: 100% 100%    Last Pain:  Vitals:   01/25/17 1530  TempSrc:   PainSc: 5                  Idabell Picking

## 2017-01-26 DIAGNOSIS — L02416 Cutaneous abscess of left lower limb: Secondary | ICD-10-CM | POA: Diagnosis not present

## 2017-01-26 DIAGNOSIS — Z7982 Long term (current) use of aspirin: Secondary | ICD-10-CM | POA: Diagnosis not present

## 2017-01-26 DIAGNOSIS — Z96653 Presence of artificial knee joint, bilateral: Secondary | ICD-10-CM | POA: Diagnosis not present

## 2017-01-26 DIAGNOSIS — G4733 Obstructive sleep apnea (adult) (pediatric): Secondary | ICD-10-CM | POA: Diagnosis not present

## 2017-01-26 DIAGNOSIS — Z79899 Other long term (current) drug therapy: Secondary | ICD-10-CM | POA: Diagnosis not present

## 2017-01-26 MED ORDER — HYDROCODONE-ACETAMINOPHEN 5-325 MG PO TABS: 1.0000 | ORAL_TABLET | ORAL | 0 refills | Status: DC | PRN

## 2017-01-26 MED ORDER — TRAMADOL HCL 50 MG PO TABS: 50.0000 mg | ORAL_TABLET | Freq: Four times a day (QID) | ORAL | 0 refills | Status: DC | PRN

## 2017-01-26 MED ORDER — CIPROFLOXACIN HCL 500 MG PO TABS: 500.0000 mg | ORAL_TABLET | Freq: Two times a day (BID) | ORAL | 0 refills | Status: DC

## 2017-01-26 MED ORDER — METHOCARBAMOL 500 MG PO TABS: 500.0000 mg | ORAL_TABLET | Freq: Four times a day (QID) | ORAL | 0 refills | Status: DC | PRN

## 2017-01-26 NOTE — Discharge Instructions (Signed)
Dr. Ollen Gross Total Joint Specialist [facility] [address], [address] [place], [place] [address] [phone]   KNEE  POSTOPERATIVE DIRECTIONS  Knee Rehabilitation, Guidelines Following Surgery  Results after knee surgery are often greatly improved when you follow the exercise, range of motion and muscle strengthening exercises prescribed by your doctor. Safety measures are also important to protect the knee from further injury. Any time any of these exercises cause you to have increased pain or swelling in your knee joint, decrease the amount until you are comfortable again and slowly increase them. If you have problems or questions, call your caregiver or physical therapist for advice.   HOME CARE INSTRUCTIONS  Remove items at home which could result in a fall. This includes throw rugs or furniture in walking pathways.   ICE to the affected knee every three hours for 30 minutes at a time and then as needed for pain and swelling.  Continue to use ice on the knee for pain and swelling from surgery. You may notice swelling that will progress down to the foot and ankle.  This is normal after surgery.  Elevate the leg when you are not up walking on it.    Continue to use the breathing machine which will help keep your temperature down.  It is common for your temperature to cycle up and down following surgery, especially at night when you are not up moving around and exerting yourself.  The breathing machine keeps your lungs expanded and your temperature down.  Do not place pillow under knee, focus on keeping the knee straight while resting  DIET You may resume your previous home diet once your are discharged from the hospital.  DRESSING / WOUND CARE / SHOWERING You may shower 3 days after surgery, but keep the wounds dry during showering.  You may use an occlusive plastic wrap (Press'n Seal for example), NO SOAKING/SUBMERGING IN THE BATHTUB.  If the bandage gets wet,  change with a clean dry gauze.  If the incision gets wet, pat the wound dry with a clean towel. You may start showering once you are discharged home but do not submerge the incision under water. Just pat the incision dry and apply a dry gauze dressing on daily. Change the surgical dressing daily and reapply a dry dressing each time.  ACTIVITY Walk with your walker as instructed. Use walker as long as suggested by your caregivers. Avoid periods of inactivity such as sitting longer than an hour when not asleep. This helps prevent blood clots.  You may resume a sexual relationship in one month or when given the OK by your doctor.  You may return to work once you are cleared by your doctor.  Do not drive a car for 6 weeks or until released by you surgeon.  Do not drive while taking narcotics.  WEIGHT BEARING Weight bearing as tolerated with assist device (walker, cane, etc) as directed, use it as long as suggested by your surgeon or therapist, typically at least 4-6 weeks.  POSTOPERATIVE CONSTIPATION PROTOCOL Constipation - defined medically as fewer than three stools per week and severe constipation as less than one stool per week.  One of the most common issues patients have following surgery is constipation.  Even if you have a regular bowel pattern at home, your normal regimen is likely to be disrupted due to multiple reasons following surgery.  Combination of anesthesia, postoperative narcotics, change in appetite and fluid intake all can affect your bowels.  In order to avoid complications following surgery, here are some recommendations in order to help you during your recovery period.  Colace (docusate) - Pick up an over-the-counter form of Colace or another stool softener and take twice a day as long as you are requiring postoperative pain medications.  Take with a full glass of water daily.  If you experience loose stools or diarrhea, hold the colace until you stool forms back up.  If your  symptoms do not get better within 1 week or if they get worse, check with your doctor.  Dulcolax (bisacodyl) - Pick up over-the-counter and take as directed by the product packaging as needed to assist with the movement of your bowels.  Take with a full glass of water.  Use this product as needed if not relieved by Colace only.   MiraLax (polyethylene glycol) - Pick up over-the-counter to have on hand.  MiraLax is a solution that will increase the amount of water in your bowels to assist with bowel movements.  Take as directed and can mix with a glass of water, juice, soda, coffee, or tea.  Take if you go more than two days without a movement. Do not use MiraLax more than once per day. Call your doctor if you are still constipated or irregular after using this medication for 7 days in a row.  If you continue to have problems with postoperative constipation, please contact the office for further assistance and recommendations.  If you experience "the worst abdominal pain ever" or develop nausea or vomiting, please contact the office immediatly for further recommendations for treatment.  ITCHING  If you experience itching with your medications, try taking only a single pain pill, or even half a pain pill at a time.  You can also use Benadryl over the counter for itching or also to help with sleep.   TED HOSE STOCKINGS Wear the elastic stockings on both legs for three weeks following surgery during the day but you may remove then at night for sleeping.  MEDICATIONS See your medication summary on the After Visit Summary that the nursing staff will review with you prior to discharge.  You may have some home medications which will be placed on hold until you complete the course of blood thinner medication.  It is important for you to complete the blood thinner medication as prescribed by your surgeon.  Continue your approved medications as instructed at time of discharge.  PRECAUTIONS If you  experience chest pain or shortness of breath - call 911 immediately for transfer to the hospital emergency department.  If you develop a fever greater that 101 F, purulent drainage from wound, increased redness or drainage from wound, foul odor from the wound/dressing, or calf pain - CONTACT YOUR SURGEON.                                                   FOLLOW-UP APPOINTMENTS Make sure you keep all of your appointments after your operation with your surgeon and caregivers. You should call the office at the above phone number and make an appointment for approximately two weeks after the date of your surgery or on the date instructed by your surgeon outlined in the "After Visit Summary".   RANGE OF MOTION AND STRENGTHENING EXERCISES  Rehabilitation of the knee is important following a knee injury or  an operation. After just a few days of immobilization, the muscles of the thigh which control the knee become weakened and shrink (atrophy). Knee exercises are designed to build up the tone and strength of the thigh muscles and to improve knee motion. Often times heat used for twenty to thirty minutes before working out will loosen up your tissues and help with improving the range of motion but do not use heat for the first two weeks following surgery. These exercises can be done on a training (exercise) mat, on the floor, on a table or on a bed. Use what ever works the best and is most comfortable for you Knee exercises include:  Leg Lifts - While your knee is still immobilized in a splint or cast, you can do straight leg raises. Lift the leg to 60 degrees, hold for 3 sec, and slowly lower the leg. Repeat 10-20 times 2-3 times daily. Perform this exercise against resistance later as your knee gets better.  Quad and Hamstring Sets - Tighten up the muscle on the front of the thigh (Quad) and hold for 5-10 sec. Repeat this 10-20 times hourly. Hamstring sets are done by pushing the foot backward against an object  and holding for 5-10 sec. Repeat as with quad sets.   Leg Slides: Lying on your back, slowly slide your foot toward your buttocks, bending your knee up off the floor (only go as far as is comfortable). Then slowly slide your foot back down until your leg is flat on the floor again.  Angel Wings: Lying on your back spread your legs to the side as far apart as you can without causing discomfort.  A rehabilitation program following serious knee injuries can speed recovery and prevent re-injury in the future due to weakened muscles. Contact your doctor or a physical therapist for more information on knee rehabilitation.   IF YOU ARE TRANSFERRED TO A SKILLED REHAB FACILITY If the patient is transferred to a skilled rehab facility following release from the hospital, a list of the current medications will be sent to the facility for the patient to continue.  When discharged from the skilled rehab facility, please have the facility set up the patient's Home Health Physical Therapy prior to being released. Also, the skilled facility will be responsible for providing the patient with their medications at time of release from the facility to include their pain medication, the muscle relaxants, and their blood thinner medication. If the patient is still at the rehab facility at time of the two week follow up appointment, the skilled rehab facility will also need to assist the patient in arranging follow up appointment in our office and any transportation needs.  MAKE SURE YOU:  Understand these instructions.  Get help right away if you are not doing well or get worse.    Pick up stool softner and laxative for home use following surgery while on pain medications. Do not submerge incision under water. Please use good hand washing techniques while changing dressing each day. May shower starting three days after surgery. Please use a clean towel to pat the incision dry following showers. Continue to use ice for  pain and swelling after surgery. Do not use any lotions or creams on the incision until instructed by your surgeon.

## 2017-01-26 NOTE — Op Note (Signed)
NAMEALIENA, GHRIST NO.:  [id number]  MEDICAL RECORD NO.:  [id number]  LOCATION:  PERIO                        FACILITY:  [facility]  PHYSICIAN:  Ollen Gross, M.D.    DATE OF BIRTH:  01-29-1941  DATE OF PROCEDURE:  01/25/2017 DATE OF DISCHARGE:                              OPERATIVE REPORT   PREOPERATIVE DIAGNOSIS:  Abscess, left knee.  POSTOPERATIVE DIAGNOSIS:  Abscess, left knee.  PROCEDURE:  Irrigation and debridement, left knee.  SURGEON:  Ollen Gross, M.D.  ASSISTANT:  No assistant.  ANESTHESIA:  General.  ESTIMATED BLOOD LOSS:  Minimal.  DRAIN:  Hemovac x1.  TOURNIQUET TIME:  16 minutes at 300 mmHg.  COMPLICATIONS:  None.  CONDITION:  Stable to recovery.  BRIEF CLINICAL NOTE:  Lauren English is a 76 year old female, very complex history in regard to her left knee.  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  This all occurred 2 years ago.  Eventually got everything to heal up fine, she definitely has infection.  She presents now 2 years later with spontaneous swelling and a small amount of drainage from inferolateral aspect of the wound.  I attempted to aspirate in the office, but could not obtain any fluid.  It did not respond to 5 days of Keflex.  She presents now for formal irrigation and debridement.  PROCEDURE IN DETAIL:  After successful administration of general anesthetic, a tourniquet was placed high on the left thigh and left lower extremity prepped and draped in the usual sterile fashion. Extremities wrapped with an Esmarch, tourniquet inflated to 300 mmHg. The area in question is about 2 cm lateral to the inferior part of the incision.  A vertical incision was made through the swollen area about 2 inches in length.  The medial upon entering, a bunch of fibrinous debris is encountered in the subcutaneous space just adjacent to the open area. It  was a walled off from the entire extent of it.  The joint was not involved.  This was below the joint and it went medial towards the tibial tubercle, but did not go into the joint.  All of the debris is removed.  Any fluid is sent for stat Gram stain, culture, sensitivity and cell count.  The wound was then copiously irrigated with 3 L of saline using pulsatile lavage. When this was completed, the deep tissues were closed with interrupted 2- 0 Vicryl, subcu with interrupted 2-0 Vicryl and subcuticular running 4-0 Monocryl.  Incision was cleaned and dried, and Steri-Strips and a bulky sterile dressing applied.  She was then awakened and transported to recovery in stable condition.     Ollen Gross, M.D.     FA/MEDQ  D:  01/25/2017  T:  01/26/2017  Job:  [id number]

## 2017-01-26 NOTE — Discharge Summary (Signed)
Physician Discharge Summary   Patient ID: Lauren English MRN: [id number] DOB/AGE: November 18, 1940 76 y.o.  Admit date: 01/25/2017 Discharge date: 01/26/2017  Primary Diagnosis:  Abscess, left knee.  Admission Diagnoses:  Past Medical History:  Diagnosis Date  . Anemia   . Arthritis    "qwhere; feet, knees, neck" (04/21/2012)  . Bladder incontinence   . Chronic back pain   . Closed angle glaucoma    "both eyes" (04/21/2012)  . Difficult intubation    with intubation on November 26, 2014  . Numbness    fingertips bilat and feet bilat   . OSA on CPAP   . Overactive bladder   . Peripheral neuropathy    feet bilat   . Pneumonia    hx. of as child  . PONV (postoperative nausea and vomiting)   . Prediabetes   . Urinary tract bacterial infections    Discharge Diagnoses:   Active Problems:   Abscess of knee, left  Estimated body mass index is 33.23 kg/m as calculated from the following:   Height as of this encounter:  (1.753 m).   Weight as of this encounter: 102.1 kg (225 lb).  Procedure(s) (LRB): IRRIGATION AND DEBRIDEMENT KNEE (Left)   Consults: None  HPI: Lauren English is a 76 year old female, very complex history in regard to her left knee.  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  This all occurred 2 years ago.  Eventually got everything to heal up fine, she definitely has infection.  She presents now 2 years later with spontaneous swelling and a small amount of drainage from inferolateral aspect of the wound.  I attempted to aspirate in the office, but could not obtain any fluid.  It did not respond to 5 days of Keflex.  She presents now for formal irrigation and Debridement.  Laboratory Data: Admission on 01/25/2017  Component Date Value Ref Range Status  . Sodium 01/25/2017 142  135 - 145 mmol/L Final  . Potassium 01/25/2017 3.7  3.5 - 5.1 mmol/L Final  . Glucose, Bld  01/25/2017 93  65 - 99 mg/dL Final  . HCT 40/98/1191 39.0  36.0 - 46.0 % Final  . Hemoglobin 01/25/2017 13.3  12.0 - 15.0 g/dL Final  . Specimen Description 01/25/2017 SYNOVIAL LEFT KNEE   Final  . Special Requests 01/25/2017 FLUID ON SWAB   Final  . Gram Stain 01/25/2017    Final                   Value:ABUNDANT WBC PRESENT,BOTH PMN AND MONONUCLEAR NO ORGANISMS SEEN Performed at [facility], [address] [place], [place] [address]   . Culture 01/25/2017 PENDING   Incomplete  . Report Status 01/25/2017 PENDING   Incomplete     X-Rays:No results found.  EKG: Orders placed or performed during the hospital encounter of 11/19/14  . EKG 12-Lead  . EKG 12-Lead     Hospital Course: Patient was admitted to [facility] and taken to the OR and underwent the above state procedure without complications.  Patient tolerated the procedure well and was later transferred to the recovery room and then to the orthopaedic floor for postoperative care.  They were given PO and IV analgesics for pain control following their surgery.  They were given 24 hours of postoperative antibiotics of  Anti-infectives    Start     Dose/Rate Route Frequency Ordered Stop   01/26/17  0000  ciprofloxacin (CIPRO) 500 MG tablet     500 mg Oral 2 times daily 01/26/17 0901     01/25/17 2030  ceFAZolin (ANCEF) IVPB 2g/100 mL premix     2 g 200 mL/hr over 30 Minutes Intravenous Every 6 hours 01/25/17 1638 01/26/17 0827   01/25/17 1424  ceFAZolin (ANCEF) 2-4 GM/100ML-% IVPB    Comments:  [name]   : cabinet override      01/25/17 1424 01/26/17 0229     and started on DVT prophylaxis in the form of Aspirin. Patient was encouraged to get up and ambulate the day after surgery.  The patient was allowed to be WBAT with therapy. Discharge planning was consulted to help with postop disposition and equipment needs.  Patient had a good night on the evening of surgery.  They started to get up OOB with therapy on  day one.   Patient was seen in rounds and was ready to go home following morning ambulation.  Diet - Diabetic diet Follow up - in two weeks. Call office for appointment at [phone]. Activity - Weight bearing as tolerated to the surgical leg.  Disposition - Home Condition Upon Discharge - Stable D/C Meds - See DC Summary DVT Prophylaxis - Aspirin   Discharge Instructions    Call MD / Call 911    Complete by:  As directed    If you experience chest pain or shortness of breath, CALL 911 and be transported to the hospital emergency room.  If you develope a fever above 101 F, pus (white drainage) or increased drainage or redness at the wound, or calf pain, call your surgeon's office.   Change dressing    Complete by:  As directed    Change dressing daily with sterile 4 x 4 inch gauze dressing and apply TED hose. Do not submerge the incision under water.   Constipation Prevention    Complete by:  As directed    Drink plenty of fluids.  Prune juice may be helpful.  You may use a stool softener, such as Colace (over the counter) 100 mg twice a day.  Use MiraLax (over the counter) for constipation as needed.   Diet general    Complete by:  As directed    Discharge instructions    Complete by:  As directed    Pick up stool softner and laxative for home use following surgery while on pain medications. Do not submerge incision under water. Please use good hand washing techniques while changing dressing each day. May shower starting three days after surgery. Please use a clean towel to pat the incision dry following showers. Continue to use ice for pain and swelling after surgery. Do not use any lotions or creams on the incision until instructed by your surgeon.  Wear both TED hose on both legs during the day every day for three weeks, but may remove the TED hose at night at home.  Postoperative Constipation Protocol  Constipation - defined medically as fewer than three stools per week and  severe constipation as less than one stool per week.  One of the most common issues patients have following surgery is constipation.  Even if you have a regular bowel pattern at home, your normal regimen is likely to be disrupted due to multiple reasons following surgery.  Combination of anesthesia, postoperative narcotics, change in appetite and fluid intake all can affect your bowels.  In order to avoid complications following surgery, here are some recommendations in  order to help you during your recovery period.  Colace (docusate) - Pick up an over-the-counter form of Colace or another stool softener and take twice a day as long as you are requiring postoperative pain medications.  Take with a full glass of water daily.  If you experience loose stools or diarrhea, hold the colace until you stool forms back up.  If your symptoms do not get better within 1 week or if they get worse, check with your doctor.  Dulcolax (bisacodyl) - Pick up over-the-counter and take as directed by the product packaging as needed to assist with the movement of your bowels.  Take with a full glass of water.  Use this product as needed if not relieved by Colace only.   MiraLax (polyethylene glycol) - Pick up over-the-counter to have on hand.  MiraLax is a solution that will increase the amount of water in your bowels to assist with bowel movements.  Take as directed and can mix with a glass of water, juice, soda, coffee, or tea.  Take if you go more than two days without a movement. Do not use MiraLax more than once per day. Call your doctor if you are still constipated or irregular after using this medication for 7 days in a row.  If you continue to have problems with postoperative constipation, please contact the office for further assistance and recommendations.  If you experience "the worst abdominal pain ever" or develop nausea or vomiting, please contact the office immediatly for further recommendations for treatment.     Do not sit on low chairs, stoools or toilet seats, as it may be difficult to get up from low surfaces    Complete by:  As directed    Driving restrictions    Complete by:  As directed    No driving until released by the physician.   Increase activity slowly as tolerated    Complete by:  As directed    Lifting restrictions    Complete by:  As directed    No lifting until released by the physician.   Patient may shower    Complete by:  As directed    You may shower without a dressing once there is no drainage.  Do not wash over the wound.  If drainage remains, do not shower until drainage stops.   TED hose    Complete by:  As directed    Use stockings (TED hose) for 3 weeks on both leg(s).  You may remove them at night for sleeping.   Weight bearing as tolerated    Complete by:  As directed    Laterality:  left   Extremity:  Lower     Allergies as of 01/26/2017      Reactions   Oxycodone Rash      Medication List    STOP taking these medications   HYDROmorphone 2 MG tablet Commonly known as:  DILAUDID     TAKE these medications   Acetyl L-Carnitine 500 MG Caps Take 500 mg by mouth daily.   aspirin 325 MG EC tablet Take 1 tablet (325 mg total) by mouth daily with breakfast. Take 325 mg Aspirin daily for three weeks following discharge. Once the patient has completed the 325 mg Aspirin, then take a Baby 81 mg Aspirin daily for three more weeks.   calcium carbonate 1500 (600 Ca) MG Tabs tablet Commonly known as:  OSCAL Take 750 mg by mouth 2 (two) times daily with a meal.  ciprofloxacin 500 MG tablet Commonly known as:  CIPRO Take 1 tablet (500 mg total) by mouth 2 (two) times daily.   docusate sodium 100 MG capsule Commonly known as:  COLACE Take 100 mg by mouth 2 (two) times daily.   HYDROcodone-acetaminophen 5-325 MG tablet Commonly known as:  NORCO/VICODIN Take 1-2 tablets by mouth every 4 (four) hours as needed (breakthrough pain). What changed:  reasons to  take this   ibuprofen 200 MG tablet Commonly known as:  ADVIL,MOTRIN Take 800 mg by mouth every 6 (six) hours as needed for mild pain.   methocarbamol 500 MG tablet Commonly known as:  ROBAXIN Take 1 tablet (500 mg total) by mouth every 6 (six) hours as needed for muscle spasms.   MYRBETRIQ 50 MG Tb24 tablet Generic drug:  mirabegron ER Take 50 mg by mouth daily.   Omega-3 1000 MG Caps Take 1,000 mg by mouth 2 (two) times daily.   REFRESH 1.4-0.6 % ophthalmic solution Generic drug:  polyvinyl alcohol-povidone Place 1 drop into both eyes daily.   traMADol 50 MG tablet Commonly known as:  ULTRAM Take 1-2 tablets (50-100 mg total) by mouth every 6 (six) hours as needed for moderate pain. What changed:  reasons to take this   TURMERIC CURCUMIN PO Take 1 capsule by mouth daily.   Vitamin D3 2000 units Tabs Take 2,000 mg by mouth daily.            Discharge Care Instructions        Start     Ordered   01/26/17 0000  ciprofloxacin (CIPRO) 500 MG tablet  2 times daily    Question:  Supervising Provider  Answer:  Ollen Gross   01/26/17 0901   01/26/17 0000  HYDROcodone-acetaminophen (NORCO/VICODIN) 5-325 MG tablet  Every 4 hours PRN    Question:  Supervising Provider  Answer:  Ollen Gross   01/26/17 0901   01/26/17 0000  methocarbamol (ROBAXIN) 500 MG tablet  Every 6 hours PRN    Question:  Supervising Provider  Answer:  Ollen Gross   01/26/17 0901   01/26/17 0000  traMADol (ULTRAM) 50 MG tablet  Every 6 hours PRN    Question:  Supervising Provider  Answer:  Ollen Gross   01/26/17 0901   01/26/17 0000  Call MD / Call 911    Comments:  If you experience chest pain or shortness of breath, CALL 911 and be transported to the hospital emergency room.  If you develope a fever above 101 F, pus (white drainage) or increased drainage or redness at the wound, or calf pain, call your surgeon's office.   01/26/17 0901   01/26/17 0000  Discharge instructions      Comments:  Pick up stool softner and laxative for home use following surgery while on pain medications. Do not submerge incision under water. Please use good hand washing techniques while changing dressing each day. May shower starting three days after surgery. Please use a clean towel to pat the incision dry following showers. Continue to use ice for pain and swelling after surgery. Do not use any lotions or creams on the incision until instructed by your surgeon.  Wear both TED hose on both legs during the day every day for three weeks, but may remove the TED hose at night at home.  Postoperative Constipation Protocol  Constipation - defined medically as fewer than three stools per week and severe constipation as less than one stool per week.  One of the most  common issues patients have following surgery is constipation.  Even if you have a regular bowel pattern at home, your normal regimen is likely to be disrupted due to multiple reasons following surgery.  Combination of anesthesia, postoperative narcotics, change in appetite and fluid intake all can affect your bowels.  In order to avoid complications following surgery, here are some recommendations in order to help you during your recovery period.  Colace (docusate) - Pick up an over-the-counter form of Colace or another stool softener and take twice a day as long as you are requiring postoperative pain medications.  Take with a full glass of water daily.  If you experience loose stools or diarrhea, hold the colace until you stool forms back up.  If your symptoms do not get better within 1 week or if they get worse, check with your doctor.  Dulcolax (bisacodyl) - Pick up over-the-counter and take as directed by the product packaging as needed to assist with the movement of your bowels.  Take with a full glass of water.  Use this product as needed if not relieved by Colace only.   MiraLax (polyethylene glycol) - Pick up over-the-counter  to have on hand.  MiraLax is a solution that will increase the amount of water in your bowels to assist with bowel movements.  Take as directed and can mix with a glass of water, juice, soda, coffee, or tea.  Take if you go more than two days without a movement. Do not use MiraLax more than once per day. Call your doctor if you are still constipated or irregular after using this medication for 7 days in a row.  If you continue to have problems with postoperative constipation, please contact the office for further assistance and recommendations.  If you experience "the worst abdominal pain ever" or develop nausea or vomiting, please contact the office immediatly for further recommendations for treatment.   01/26/17 0901   01/26/17 0000  Constipation Prevention    Comments:  Drink plenty of fluids.  Prune juice may be helpful.  You may use a stool softener, such as Colace (over the counter) 100 mg twice a day.  Use MiraLax (over the counter) for constipation as needed.   01/26/17 0901   01/26/17 0000  Diet general     01/26/17 0901   01/26/17 0000  Increase activity slowly as tolerated     01/26/17 0901   01/26/17 0000  Patient may shower    Comments:  You may shower without a dressing once there is no drainage.  Do not wash over the wound.  If drainage remains, do not shower until drainage stops.   01/26/17 0901   01/26/17 0000  Weight bearing as tolerated    Question Answer Comment  Laterality left   Extremity Lower      01/26/17 0901   01/26/17 0000  Driving restrictions    Comments:  No driving until released by the physician.   01/26/17 0901   01/26/17 0000  Lifting restrictions    Comments:  No lifting until released by the physician.   01/26/17 0901   01/26/17 0000  TED hose    Comments:  Use stockings (TED hose) for 3 weeks on both leg(s).  You may remove them at night for sleeping.   01/26/17 0901   01/26/17 0000  Change dressing    Comments:  Change dressing daily with sterile  4 x 4 inch gauze dressing and apply TED hose. Do not submerge the  incision under water.   01/26/17 0901   01/26/17 0000  Do not sit on low chairs, stoools or toilet seats, as it may be difficult to get up from low surfaces     01/26/17 0901     Follow-up Information    Aluisio, Homero Fellers, MD. Schedule an appointment as soon as possible for a visit on 02/09/2017.   Specialty:  Orthopedic Surgery Contact information: [address] [place] [address] [phone]           Signed: Avel Peace, PA-C Orthopaedic Surgery 01/26/2017, 9:02 AM

## 2017-01-26 NOTE — Progress Notes (Signed)
   Subjective: 1 Day Post-Op Procedure(s) (LRB): IRRIGATION AND DEBRIDEMENT KNEE (Left) Patient reports pain as mild.   Patient seen in rounds for Dr. Lequita Halt. Patient is well, but has had some minor complaints of pain in the knee, requiring pain medications We will have her up and about today and she can be WBAT to that left leg. Plan is to go Home after hospital stay.  Objective: Vital signs in last 24 hours: Temp:  [97.6 F (36.4 C)-98.5 F (36.9 C)] 97.8 F (36.6 C) (09/18 0624) Pulse Rate:  [50-80] 50 (09/18 0624) Resp:  [8-20] 18 (09/18 0624) BP: (115-162)/(53-80) 124/54 (09/18 0624) SpO2:  [95 %-100 %] 98 % (09/18 0624) Weight:  [102.1 kg (225 lb)] 102.1 kg (225 lb) (09/17 1228)  Intake/Output from previous day: 09/17 0701 - 09/18 0700 In: 2151.3 [P.O.:460; I.V.:1441.3; IV Piggyback:250] Out: 1720 [Urine:1700; Drains:10; Blood:10] Intake/Output this shift: No intake/output data recorded.   Recent Labs  01/25/17 1300  HGB 13.3    Recent Labs  01/25/17 1300  HCT 39.0    Recent Labs  01/25/17 1300  NA 142  K 3.7  GLUCOSE 93   No results for input(s): LABPT, INR in the last 72 hours.   Aerobic/Anaerobic Culture (surgical/deep wound)  Order: [id number]  Status:  Preliminary result Visible to patient:  No (Not Released) Next appt:  None  Component 1d ago  Specimen Description SYNOVIAL LEFT KNEE   Special Requests FLUID ON SWAB   Gram Stain ABUNDANT WBC PRESENT,BOTH PMN AND MONONUCLEAR  NO ORGANISMS SEEN  Performed at [facility], [address], [place], [place] [address]      Culture PENDING          EXAM General - Patient is Alert, Appropriate and Oriented Extremity - Neurovascular intact Sensation intact distally Intact pulses distally Dorsiflexion/Plantar flexion intact Dressing - dressing C/D/I Motor Function - intact, moving foot and toes well on exam.  HV drain pulled.  Past Medical History:  Diagnosis Date  . Anemia   .  Arthritis    "qwhere; feet, knees, neck" (04/21/2012)  . Bladder incontinence   . Chronic back pain   . Closed angle glaucoma    "both eyes" (04/21/2012)  . Difficult intubation    with intubation on November 26, 2014  . Numbness    fingertips bilat and feet bilat   . OSA on CPAP   . Overactive bladder   . Peripheral neuropathy    feet bilat   . Pneumonia    hx. of as child  . PONV (postoperative nausea and vomiting)   . Prediabetes   . Urinary tract bacterial infections     Assessment/Plan: 1 Day Post-Op Procedure(s) (LRB): IRRIGATION AND DEBRIDEMENT KNEE (Left) Active Problems:   Abscess of knee, left  Estimated body mass index is 33.23 kg/m as calculated from the following:   Height as of this encounter:  (1.753 m).   Weight as of this encounter: 102.1 kg (225 lb).  DC Home  Weight-Bearing as tolerated to left leg D/C O2 and Pulse OX and try on Room Air  DC Meds - see summary Disposition - Home F/U in two weeks. COD - Improving  Avel Peace, PA-C Orthopaedic Surgery 01/26/2017, 8:54 AM

## 2017-01-30 LAB — AEROBIC/ANAEROBIC CULTURE W GRAM STAIN (SURGICAL/DEEP WOUND)

## 2017-01-30 LAB — AEROBIC/ANAEROBIC CULTURE (SURGICAL/DEEP WOUND): CULTURE: NO GROWTH

## 2017-02-09 DIAGNOSIS — L02416 Cutaneous abscess of left lower limb: Secondary | ICD-10-CM | POA: Diagnosis not present

## 2017-03-02 DIAGNOSIS — L02416 Cutaneous abscess of left lower limb: Secondary | ICD-10-CM | POA: Diagnosis not present

## 2017-03-02 DIAGNOSIS — M1712 Unilateral primary osteoarthritis, left knee: Secondary | ICD-10-CM | POA: Diagnosis not present

## 2017-03-04 DIAGNOSIS — Z23 Encounter for immunization: Secondary | ICD-10-CM | POA: Diagnosis not present

## 2017-03-30 DIAGNOSIS — L02416 Cutaneous abscess of left lower limb: Secondary | ICD-10-CM | POA: Diagnosis not present

## 2017-04-22 DIAGNOSIS — M5442 Lumbago with sciatica, left side: Secondary | ICD-10-CM | POA: Diagnosis not present

## 2017-04-22 DIAGNOSIS — G8929 Other chronic pain: Secondary | ICD-10-CM | POA: Diagnosis not present

## 2017-04-30 DIAGNOSIS — M5416 Radiculopathy, lumbar region: Secondary | ICD-10-CM | POA: Diagnosis not present

## 2017-04-30 DIAGNOSIS — R030 Elevated blood-pressure reading, without diagnosis of hypertension: Secondary | ICD-10-CM | POA: Diagnosis not present

## 2017-04-30 DIAGNOSIS — M545 Low back pain: Secondary | ICD-10-CM | POA: Diagnosis not present

## 2017-05-11 HISTORY — PX: EYE SURGERY: SHX253

## 2017-05-12 DIAGNOSIS — E559 Vitamin D deficiency, unspecified: Secondary | ICD-10-CM | POA: Diagnosis not present

## 2017-05-12 DIAGNOSIS — Z79899 Other long term (current) drug therapy: Secondary | ICD-10-CM | POA: Diagnosis not present

## 2017-05-12 DIAGNOSIS — R7303 Prediabetes: Secondary | ICD-10-CM | POA: Diagnosis not present

## 2017-05-12 DIAGNOSIS — Z6835 Body mass index (BMI) 35.0-35.9, adult: Secondary | ICD-10-CM | POA: Diagnosis not present

## 2017-05-12 DIAGNOSIS — R030 Elevated blood-pressure reading, without diagnosis of hypertension: Secondary | ICD-10-CM | POA: Diagnosis not present

## 2017-05-13 DIAGNOSIS — M129 Arthropathy, unspecified: Secondary | ICD-10-CM | POA: Diagnosis not present

## 2017-05-13 DIAGNOSIS — Z1389 Encounter for screening for other disorder: Secondary | ICD-10-CM | POA: Diagnosis not present

## 2017-05-13 DIAGNOSIS — Z6835 Body mass index (BMI) 35.0-35.9, adult: Secondary | ICD-10-CM | POA: Diagnosis not present

## 2017-05-13 DIAGNOSIS — N3281 Overactive bladder: Secondary | ICD-10-CM | POA: Diagnosis not present

## 2017-05-13 DIAGNOSIS — M899 Disorder of bone, unspecified: Secondary | ICD-10-CM | POA: Diagnosis not present

## 2017-05-13 DIAGNOSIS — R5383 Other fatigue: Secondary | ICD-10-CM | POA: Diagnosis not present

## 2017-05-13 DIAGNOSIS — Z0000 Encounter for general adult medical examination without abnormal findings: Secondary | ICD-10-CM | POA: Diagnosis not present

## 2017-05-13 DIAGNOSIS — G4730 Sleep apnea, unspecified: Secondary | ICD-10-CM | POA: Diagnosis not present

## 2017-05-13 DIAGNOSIS — E7800 Pure hypercholesterolemia, unspecified: Secondary | ICD-10-CM | POA: Diagnosis not present

## 2017-05-14 DIAGNOSIS — H40013 Open angle with borderline findings, low risk, bilateral: Secondary | ICD-10-CM | POA: Diagnosis not present

## 2017-05-14 DIAGNOSIS — H25013 Cortical age-related cataract, bilateral: Secondary | ICD-10-CM | POA: Diagnosis not present

## 2017-05-14 DIAGNOSIS — H2513 Age-related nuclear cataract, bilateral: Secondary | ICD-10-CM | POA: Diagnosis not present

## 2017-05-14 DIAGNOSIS — H35361 Drusen (degenerative) of macula, right eye: Secondary | ICD-10-CM | POA: Diagnosis not present

## 2017-09-24 ENCOUNTER — Other Ambulatory Visit: Payer: Self-pay | Admitting: Family Medicine

## 2017-09-24 DIAGNOSIS — Z1231 Encounter for screening mammogram for malignant neoplasm of breast: Secondary | ICD-10-CM

## 2017-10-21 ENCOUNTER — Ambulatory Visit
Admission: RE | Admit: 2017-10-21 | Discharge: 2017-10-21 | Disposition: A | Discharge status: Home / Self Care | Payer: Medicare Other | Source: Ambulatory Visit | Attending: Family Medicine | Admitting: Family Medicine

## 2017-10-21 DIAGNOSIS — Z1231 Encounter for screening mammogram for malignant neoplasm of breast: Secondary | ICD-10-CM | POA: Diagnosis not present

## 2017-12-03 DIAGNOSIS — Z6832 Body mass index (BMI) 32.0-32.9, adult: Secondary | ICD-10-CM | POA: Diagnosis not present

## 2017-12-03 DIAGNOSIS — M545 Low back pain: Secondary | ICD-10-CM | POA: Diagnosis not present

## 2017-12-03 DIAGNOSIS — M5416 Radiculopathy, lumbar region: Secondary | ICD-10-CM | POA: Diagnosis not present

## 2017-12-10 DIAGNOSIS — M5416 Radiculopathy, lumbar region: Secondary | ICD-10-CM | POA: Diagnosis not present

## 2017-12-14 DIAGNOSIS — M5416 Radiculopathy, lumbar region: Secondary | ICD-10-CM | POA: Diagnosis not present

## 2017-12-14 DIAGNOSIS — M48061 Spinal stenosis, lumbar region without neurogenic claudication: Secondary | ICD-10-CM | POA: Diagnosis not present

## 2017-12-24 DIAGNOSIS — M48062 Spinal stenosis, lumbar region with neurogenic claudication: Secondary | ICD-10-CM | POA: Diagnosis not present

## 2017-12-24 DIAGNOSIS — Z6832 Body mass index (BMI) 32.0-32.9, adult: Secondary | ICD-10-CM | POA: Diagnosis not present

## 2018-01-25 DIAGNOSIS — H40013 Open angle with borderline findings, low risk, bilateral: Secondary | ICD-10-CM | POA: Diagnosis not present

## 2018-01-25 DIAGNOSIS — H35361 Drusen (degenerative) of macula, right eye: Secondary | ICD-10-CM | POA: Diagnosis not present

## 2018-01-25 DIAGNOSIS — H2513 Age-related nuclear cataract, bilateral: Secondary | ICD-10-CM | POA: Diagnosis not present

## 2018-01-25 DIAGNOSIS — H04123 Dry eye syndrome of bilateral lacrimal glands: Secondary | ICD-10-CM | POA: Diagnosis not present

## 2018-01-25 DIAGNOSIS — H25013 Cortical age-related cataract, bilateral: Secondary | ICD-10-CM | POA: Diagnosis not present

## 2018-01-28 DIAGNOSIS — Z23 Encounter for immunization: Secondary | ICD-10-CM | POA: Diagnosis not present

## 2018-02-16 DIAGNOSIS — H52222 Regular astigmatism, left eye: Secondary | ICD-10-CM | POA: Diagnosis not present

## 2018-02-16 DIAGNOSIS — H25812 Combined forms of age-related cataract, left eye: Secondary | ICD-10-CM | POA: Diagnosis not present

## 2018-02-16 DIAGNOSIS — H2512 Age-related nuclear cataract, left eye: Secondary | ICD-10-CM | POA: Diagnosis not present

## 2018-02-22 DIAGNOSIS — H25011 Cortical age-related cataract, right eye: Secondary | ICD-10-CM | POA: Diagnosis not present

## 2018-02-22 DIAGNOSIS — H2511 Age-related nuclear cataract, right eye: Secondary | ICD-10-CM | POA: Diagnosis not present

## 2018-03-09 DIAGNOSIS — H25011 Cortical age-related cataract, right eye: Secondary | ICD-10-CM | POA: Diagnosis not present

## 2018-03-09 DIAGNOSIS — H25811 Combined forms of age-related cataract, right eye: Secondary | ICD-10-CM | POA: Diagnosis not present

## 2018-03-09 DIAGNOSIS — H52221 Regular astigmatism, right eye: Secondary | ICD-10-CM | POA: Diagnosis not present

## 2018-03-09 DIAGNOSIS — H2511 Age-related nuclear cataract, right eye: Secondary | ICD-10-CM | POA: Diagnosis not present

## 2018-06-21 DIAGNOSIS — Z79899 Other long term (current) drug therapy: Secondary | ICD-10-CM | POA: Diagnosis not present

## 2018-06-21 DIAGNOSIS — E559 Vitamin D deficiency, unspecified: Secondary | ICD-10-CM | POA: Diagnosis not present

## 2018-06-21 DIAGNOSIS — Z1211 Encounter for screening for malignant neoplasm of colon: Secondary | ICD-10-CM | POA: Diagnosis not present

## 2018-06-21 DIAGNOSIS — R7303 Prediabetes: Secondary | ICD-10-CM | POA: Diagnosis not present

## 2018-06-21 DIAGNOSIS — M179 Osteoarthritis of knee, unspecified: Secondary | ICD-10-CM | POA: Diagnosis not present

## 2018-06-21 DIAGNOSIS — M5416 Radiculopathy, lumbar region: Secondary | ICD-10-CM | POA: Diagnosis not present

## 2018-06-21 DIAGNOSIS — N3281 Overactive bladder: Secondary | ICD-10-CM | POA: Diagnosis not present

## 2018-06-21 DIAGNOSIS — E8881 Metabolic syndrome: Secondary | ICD-10-CM | POA: Diagnosis not present

## 2018-06-21 DIAGNOSIS — Z0000 Encounter for general adult medical examination without abnormal findings: Secondary | ICD-10-CM | POA: Diagnosis not present

## 2018-06-21 DIAGNOSIS — D692 Other nonthrombocytopenic purpura: Secondary | ICD-10-CM | POA: Diagnosis not present

## 2018-06-21 DIAGNOSIS — F324 Major depressive disorder, single episode, in partial remission: Secondary | ICD-10-CM | POA: Diagnosis not present

## 2018-06-21 DIAGNOSIS — G4730 Sleep apnea, unspecified: Secondary | ICD-10-CM | POA: Diagnosis not present

## 2018-10-10 ENCOUNTER — Other Ambulatory Visit: Payer: Self-pay | Admitting: Family Medicine

## 2018-10-14 ENCOUNTER — Other Ambulatory Visit: Payer: Self-pay | Admitting: Family Medicine

## 2018-10-18 ENCOUNTER — Other Ambulatory Visit: Payer: Self-pay | Admitting: Family Medicine

## 2018-10-18 DIAGNOSIS — E2839 Other primary ovarian failure: Secondary | ICD-10-CM

## 2018-10-21 ENCOUNTER — Other Ambulatory Visit: Payer: Self-pay | Admitting: Family Medicine

## 2018-10-21 DIAGNOSIS — Z1231 Encounter for screening mammogram for malignant neoplasm of breast: Secondary | ICD-10-CM

## 2018-10-26 DIAGNOSIS — M79642 Pain in left hand: Secondary | ICD-10-CM | POA: Diagnosis not present

## 2018-10-28 DIAGNOSIS — M79642 Pain in left hand: Secondary | ICD-10-CM | POA: Diagnosis not present

## 2018-11-02 ENCOUNTER — Ambulatory Visit
Admission: RE | Admit: 2018-11-02 | Discharge: 2018-11-02 | Disposition: A | Discharge status: Home / Self Care | Payer: Medicare Other | Source: Ambulatory Visit | Attending: Family Medicine | Admitting: Family Medicine

## 2018-11-02 DIAGNOSIS — Z1231 Encounter for screening mammogram for malignant neoplasm of breast: Secondary | ICD-10-CM | POA: Diagnosis not present

## 2018-11-15 DIAGNOSIS — M65332 Trigger finger, left middle finger: Secondary | ICD-10-CM | POA: Diagnosis not present

## 2018-11-15 DIAGNOSIS — M79642 Pain in left hand: Secondary | ICD-10-CM | POA: Diagnosis not present

## 2018-12-27 ENCOUNTER — Other Ambulatory Visit: Payer: Self-pay

## 2018-12-27 ENCOUNTER — Ambulatory Visit
Admission: RE | Admit: 2018-12-27 | Discharge: 2018-12-27 | Disposition: A | Discharge status: Home / Self Care | Payer: Medicare Other | Source: Ambulatory Visit | Attending: Family Medicine | Admitting: Family Medicine

## 2018-12-27 DIAGNOSIS — E2839 Other primary ovarian failure: Secondary | ICD-10-CM

## 2018-12-27 DIAGNOSIS — Z13820 Encounter for screening for osteoporosis: Secondary | ICD-10-CM | POA: Diagnosis not present

## 2018-12-27 DIAGNOSIS — Z780 Asymptomatic menopausal state: Secondary | ICD-10-CM | POA: Diagnosis not present

## 2019-01-06 DIAGNOSIS — H35361 Drusen (degenerative) of macula, right eye: Secondary | ICD-10-CM | POA: Diagnosis not present

## 2019-01-06 DIAGNOSIS — Z961 Presence of intraocular lens: Secondary | ICD-10-CM | POA: Diagnosis not present

## 2019-01-06 DIAGNOSIS — H04123 Dry eye syndrome of bilateral lacrimal glands: Secondary | ICD-10-CM | POA: Diagnosis not present

## 2019-01-06 DIAGNOSIS — H40013 Open angle with borderline findings, low risk, bilateral: Secondary | ICD-10-CM | POA: Diagnosis not present

## 2019-01-10 DIAGNOSIS — M48062 Spinal stenosis, lumbar region with neurogenic claudication: Secondary | ICD-10-CM | POA: Diagnosis not present

## 2019-02-13 DIAGNOSIS — E559 Vitamin D deficiency, unspecified: Secondary | ICD-10-CM | POA: Diagnosis not present

## 2019-02-13 DIAGNOSIS — F324 Major depressive disorder, single episode, in partial remission: Secondary | ICD-10-CM | POA: Diagnosis not present

## 2019-02-13 DIAGNOSIS — G4730 Sleep apnea, unspecified: Secondary | ICD-10-CM | POA: Diagnosis not present

## 2019-02-17 DIAGNOSIS — E559 Vitamin D deficiency, unspecified: Secondary | ICD-10-CM | POA: Diagnosis not present

## 2019-02-17 DIAGNOSIS — Z23 Encounter for immunization: Secondary | ICD-10-CM | POA: Diagnosis not present

## 2019-04-10 ENCOUNTER — Other Ambulatory Visit: Payer: Self-pay

## 2019-04-10 DIAGNOSIS — Z20822 Contact with and (suspected) exposure to covid-19: Secondary | ICD-10-CM

## 2019-04-10 DIAGNOSIS — Z20828 Contact with and (suspected) exposure to other viral communicable diseases: Secondary | ICD-10-CM | POA: Diagnosis not present

## 2019-04-11 LAB — NOVEL CORONAVIRUS, NAA: SARS-CoV-2, NAA: NOT DETECTED

## 2019-07-11 DIAGNOSIS — G4730 Sleep apnea, unspecified: Secondary | ICD-10-CM | POA: Diagnosis not present

## 2019-07-11 DIAGNOSIS — M5416 Radiculopathy, lumbar region: Secondary | ICD-10-CM | POA: Diagnosis not present

## 2019-07-11 DIAGNOSIS — D692 Other nonthrombocytopenic purpura: Secondary | ICD-10-CM | POA: Diagnosis not present

## 2019-07-11 DIAGNOSIS — Z6833 Body mass index (BMI) 33.0-33.9, adult: Secondary | ICD-10-CM | POA: Diagnosis not present

## 2019-07-11 DIAGNOSIS — I10 Essential (primary) hypertension: Secondary | ICD-10-CM | POA: Diagnosis not present

## 2019-07-11 DIAGNOSIS — F324 Major depressive disorder, single episode, in partial remission: Secondary | ICD-10-CM | POA: Diagnosis not present

## 2019-07-11 DIAGNOSIS — M179 Osteoarthritis of knee, unspecified: Secondary | ICD-10-CM | POA: Diagnosis not present

## 2019-07-11 DIAGNOSIS — Z0000 Encounter for general adult medical examination without abnormal findings: Secondary | ICD-10-CM | POA: Diagnosis not present

## 2019-07-11 DIAGNOSIS — Z79899 Other long term (current) drug therapy: Secondary | ICD-10-CM | POA: Diagnosis not present

## 2019-07-11 DIAGNOSIS — E8881 Metabolic syndrome: Secondary | ICD-10-CM | POA: Diagnosis not present

## 2019-07-11 DIAGNOSIS — E559 Vitamin D deficiency, unspecified: Secondary | ICD-10-CM | POA: Diagnosis not present

## 2019-07-11 DIAGNOSIS — E7800 Pure hypercholesterolemia, unspecified: Secondary | ICD-10-CM | POA: Diagnosis not present

## 2019-07-11 DIAGNOSIS — N3281 Overactive bladder: Secondary | ICD-10-CM | POA: Diagnosis not present

## 2019-07-24 DIAGNOSIS — N390 Urinary tract infection, site not specified: Secondary | ICD-10-CM | POA: Diagnosis not present

## 2019-07-24 DIAGNOSIS — R300 Dysuria: Secondary | ICD-10-CM | POA: Diagnosis not present

## 2019-08-14 ENCOUNTER — Emergency Department (HOSPITAL_COMMUNITY)
Admission: EM | Admit: 2019-08-14 | Discharge: 2019-08-14 | Disposition: A | Discharge status: Home / Self Care | Payer: Medicare Other | Attending: Emergency Medicine | Admitting: Emergency Medicine

## 2019-08-14 ENCOUNTER — Encounter (HOSPITAL_COMMUNITY): Payer: Self-pay | Admitting: Emergency Medicine

## 2019-08-14 ENCOUNTER — Emergency Department (HOSPITAL_COMMUNITY): Payer: Medicare Other

## 2019-08-14 ENCOUNTER — Other Ambulatory Visit: Payer: Self-pay

## 2019-08-14 DIAGNOSIS — K8020 Calculus of gallbladder without cholecystitis without obstruction: Secondary | ICD-10-CM | POA: Diagnosis not present

## 2019-08-14 DIAGNOSIS — R9431 Abnormal electrocardiogram [ECG] [EKG]: Secondary | ICD-10-CM | POA: Diagnosis not present

## 2019-08-14 DIAGNOSIS — R1010 Upper abdominal pain, unspecified: Secondary | ICD-10-CM | POA: Diagnosis not present

## 2019-08-14 DIAGNOSIS — Z79899 Other long term (current) drug therapy: Secondary | ICD-10-CM | POA: Insufficient documentation

## 2019-08-14 LAB — COMPREHENSIVE METABOLIC PANEL
ALT: 16 U/L (ref 0–44)
AST: 16 U/L (ref 15–41)
Albumin: 3.9 g/dL (ref 3.5–5.0)
Alkaline Phosphatase: 52 U/L (ref 38–126)
Anion gap: 9 (ref 5–15)
BUN: 13 mg/dL (ref 8–23)
CO2: 25 mmol/L (ref 22–32)
Calcium: 9.1 mg/dL (ref 8.9–10.3)
Chloride: 102 mmol/L (ref 98–111)
Creatinine, Ser: 0.86 mg/dL (ref 0.44–1.00)
GFR calc Af Amer: >60 mL/min (ref >60)
GFR calc non Af Amer: >60 mL/min (ref >60)
Glucose, Bld: 120 mg/dL — ABNORMAL HIGH (ref 70–99)
Potassium: 3.8 mmol/L (ref 3.5–5.1)
Sodium: 136 mmol/L (ref 135–145)
Total Bilirubin: 1.1 mg/dL (ref 0.3–1.2)
Total Protein: 7.5 g/dL (ref 6.5–8.1)

## 2019-08-14 LAB — CBC WITH DIFFERENTIAL/PLATELET
Abs Immature Granulocytes: 0.03 10*3/uL (ref 0.00–0.07)
Basophils Absolute: 0 10*3/uL (ref 0.0–0.1)
Basophils Relative: 1 %
Eosinophils Absolute: 0 10*3/uL (ref 0.0–0.5)
Eosinophils Relative: 0 %
HCT: 44.7 % (ref 36.0–46.0)
Hemoglobin: 14.8 g/dL (ref 12.0–15.0)
Immature Granulocytes: 0 %
Lymphocytes Relative: 19 %
Lymphs Abs: 1.5 10*3/uL (ref 0.7–4.0)
MCH: 28.7 pg (ref 26.0–34.0)
MCHC: 33.1 g/dL (ref 30.0–36.0)
MCV: 86.8 fL (ref 80.0–100.0)
Monocytes Absolute: 0.6 10*3/uL (ref 0.1–1.0)
Monocytes Relative: 8 %
Neutro Abs: 5.9 10*3/uL (ref 1.7–7.7)
Neutrophils Relative %: 72 %
Platelets: 207 10*3/uL (ref 150–400)
RBC: 5.15 MIL/uL — ABNORMAL HIGH (ref 3.87–5.11)
RDW: 13 % (ref 11.5–15.5)
WBC: 8.2 10*3/uL (ref 4.0–10.5)
nRBC: 0 % (ref 0.0–0.2)

## 2019-08-14 LAB — LIPASE, BLOOD: Lipase: 20 U/L (ref 11–51)

## 2019-08-14 MED ORDER — SODIUM CHLORIDE 0.9 % IV BOLUS
1000.0000 mL | Freq: Once | INTRAVENOUS | Status: AC
  Administered 2019-08-14: 1000 mL via INTRAVENOUS

## 2019-08-14 MED ORDER — FENTANYL CITRATE (PF) 100 MCG/2ML IJ SOLN
100.0000 ug | Freq: Once | INTRAMUSCULAR | Status: AC
  Administered 2019-08-14: 100 ug via INTRAVENOUS
  Filled 2019-08-14: qty 2

## 2019-08-14 MED ORDER — HYDROCODONE-ACETAMINOPHEN 5-325 MG PO TABS: 1.0000 | ORAL_TABLET | Freq: Four times a day (QID) | ORAL | 0 refills | Status: DC | PRN

## 2019-08-14 MED ORDER — HYDROCODONE-ACETAMINOPHEN 5-325 MG PO TABS
1.0000 | ORAL_TABLET | Freq: Once | ORAL | Status: AC
  Administered 2019-08-14: 1 via ORAL
  Filled 2019-08-14: qty 1

## 2019-08-14 NOTE — ED Provider Notes (Signed)
[facility]-EMERGENCY DEPT Provider Note   CSN: [id number] Arrival date & time: 08/14/19  1115     History Chief Complaint  Patient presents with  . Abdominal Pain    Lauren English is a 79 y.o. female.  HPI 79 year old female presents with upper abdominal pain.  Worse on the right.  Started originally about 4 days ago but much more severe 2 days ago.  Has been constant since and is rated as an 8 or 9 out of 10.  Before this it was just uncomfortable and intermittent.  She had a higher fat meal 2 days ago and thinks this started the worsening pain.  No fevers or vomiting.  She has had low-grade temperature in the high 99.  Sometimes the pain goes to her back.  No significant chest pain. No shortness of breath.   Past Medical History:  Diagnosis Date  . Anemia   . Arthritis    "qwhere; feet, knees, neck" (04/21/2012)  . Bladder incontinence   . Chronic back pain   . Closed angle glaucoma    "both eyes" (04/21/2012)  . Difficult intubation    with intubation on November 26, 2014  . Numbness    fingertips bilat and feet bilat   . OSA on CPAP   . Overactive bladder   . Peripheral neuropathy    feet bilat   . Pneumonia    hx. of as child  . PONV (postoperative nausea and vomiting)   . Prediabetes   . Urinary tract bacterial infections     Patient Active Problem List   Diagnosis Date Noted  . Abscess of knee, left 01/25/2017  . Arthrofibrosis of total knee arthroplasty (HCC) 03/25/2015  . Quadriceps tendon rupture 12/09/2014  . OA (osteoarthritis) of knee 11/26/2014    Past Surgical History:  Procedure Laterality Date  . ABDOMINAL HYSTERECTOMY  1980   "partial" (04/21/2012)  . ANTERIOR FUSION CERVICAL SPINE  12/2005  . APPENDECTOMY    . BACK SURGERY    . BLADDER SUSPENSION  1990  . BREAST EXCISIONAL BIOPSY Bilateral    Benign  . DILATION AND CURETTAGE OF UTERUS  1980  . IRRIGATION AND DEBRIDEMENT KNEE Left 01/25/2017   Procedure: IRRIGATION  AND DEBRIDEMENT KNEE;  Surgeon: Ollen Gross, MD;  Location: [facility];  Service: Orthopedics;  Laterality: Left;  . KNEE ARTHROPLASTY  06/2008   "slipped on ice; torn ligaments & cartilage" (04/21/2012)  . KNEE CLOSED REDUCTION Left 03/25/2015   Procedure: CLOSED MANIPULATION LEFT KNEE;  Surgeon: Ollen Gross, MD;  Location: [facility];  Service: Orthopedics;  Laterality: Left;  . KNEE CLOSED REDUCTION Left 11/18/2015   Procedure: LEFT KNEE CLOSED MANIPULATION;  Surgeon: Ollen Gross, MD;  Location: [facility];  Service: Orthopedics;  Laterality: Left;  . PATELLAR TENDON REPAIR Left 12/10/2014   Procedure: REPAIR PARTIAL QUAD TENDON TEAR;  Surgeon: Ollen Gross, MD;  Location: [facility];  Service: Orthopedics;  Laterality: Left;  . POSTERIOR LUMBAR FUSION  04/21/2012  . TONSILLECTOMY AND ADENOIDECTOMY  ~ 1950  . TOTAL KNEE ARTHROPLASTY Left 11/26/2014   Procedure: TOTAL LEFT   KNEE ARTHROPLASTY;  Surgeon: Ollen Gross, MD;  Location: [facility];  Service: Orthopedics;  Laterality: Left;  . TOTAL KNEE ARTHROPLASTY Right 11/18/2015   Procedure: RIGHT TOTAL KNEE ARTHROPLASTY;  Surgeon: Ollen Gross, MD;  Location: [facility];  Service: Orthopedics;  Laterality: Right;  . TUBAL LIGATION  1970's     OB History   No obstetric history on file.  Family History  Problem Relation Age of Onset  . Breast cancer Maternal Aunt     Social History   Tobacco Use  . Smoking status: Never Smoker  . Smokeless tobacco: Never Used  Substance Use Topics  . Alcohol use: Yes    Alcohol/week: 1.0 standard drinks    Types: 1 Glasses of wine per week    Comment: 04/21/2012 "glass of wine maybe once/wk"  . Drug use: No    Home Medications Prior to Admission medications   Medication Sig Start Date End Date Taking? Authorizing Provider  Acetylcarnitine HCl (ACETYL L-CARNITINE) 500 MG CAPS Take 500 mg by mouth daily.   Yes [provider]  calcium carbonate (OSCAL) 1500 (600 Ca) MG TABS tablet Take 750 mg by  mouth 2 (two) times daily with a meal.   Yes [provider]  Cholecalciferol (VITAMIN D3) 2000 units TABS Take 2,000 mg by mouth daily.   Yes [provider]  docusate sodium (COLACE) 100 MG capsule Take 100 mg by mouth 2 (two) times daily.   Yes [provider]  ibuprofen (ADVIL,MOTRIN) 200 MG tablet Take 800 mg by mouth every 6 (six) hours as needed for mild pain.   Yes [provider]  MYRBETRIQ 50 MG TB24 tablet Take 50 mg by mouth daily. 10/02/15  Yes [provider]  Omega-3 1000 MG CAPS Take 1,000 mg by mouth 2 (two) times daily.   Yes [provider]  polyvinyl alcohol-povidone (REFRESH) 1.4-0.6 % ophthalmic solution Place 1 drop into both eyes daily.   Yes [provider]  TURMERIC CURCUMIN PO Take 1 capsule by mouth daily.   Yes [provider]  HYDROcodone-acetaminophen (NORCO) 5-325 MG tablet Take 1 tablet by mouth every 6 (six) hours as needed for severe pain. 08/14/19   Sherwood Gambler, MD    Allergies    Oxycodone  Review of Systems   Review of Systems  Constitutional: Negative for fever.  Respiratory: Negative for shortness of breath.   Gastrointestinal: Positive for abdominal pain. Negative for diarrhea and vomiting.  Genitourinary: Negative for dysuria.  Musculoskeletal: Positive for back pain.  All other systems reviewed and are negative.   Physical Exam Updated Vital Signs BP (!) 152/59   Pulse 77   Temp 98.1 F (36.7 C) (Oral)   Resp 15   SpO2 97%   Physical Exam Vitals and nursing note reviewed.  Constitutional:      General: She is not in acute distress.    Appearance: She is well-developed. She is not ill-appearing or diaphoretic.  HENT:     Head: Normocephalic and atraumatic.     Right Ear: External ear normal.     Left Ear: External ear normal.     Nose: Nose normal.  Eyes:     General:        Right eye: No discharge.        Left eye: No discharge.  Cardiovascular:     Rate  and Rhythm: Normal rate and regular rhythm.     Heart sounds: Normal heart sounds.  Pulmonary:     Effort: Pulmonary effort is normal.     Breath sounds: Normal breath sounds.  Abdominal:     Palpations: Abdomen is soft.     Tenderness: There is abdominal tenderness (focal tenderness in RUQ. mild in RLQ) in the right upper quadrant, right lower quadrant and epigastric area.  Skin:    General: Skin is warm and dry.  Neurological:  Mental Status: She is alert.  Psychiatric:        Mood and Affect: Mood is not anxious.     ED Results / Procedures / Treatments   Labs (all labs ordered are listed, but only abnormal results are displayed) Labs Reviewed  COMPREHENSIVE METABOLIC PANEL - Abnormal; Notable for the following components:      Result Value   Glucose, Bld 120 (*)    All other components within normal limits  CBC WITH DIFFERENTIAL/PLATELET - Abnormal; Notable for the following components:   RBC 5.15 (*)    All other components within normal limits  LIPASE, BLOOD    EKG EKG Interpretation  Date/Time:  Monday August 14 2019 11:55:02 EDT Ventricular Rate:  64 PR Interval:    QRS Duration: 82 QT Interval:  403 QTC Calculation: 416 R Axis:   32 Text Interpretation: Sinus rhythm Abnormal R-wave progression, early transition no acute ST/T changes Confirmed by Pricilla Loveless [phone]) on 08/14/2019 12:16:43 PM   Radiology US Abdomen Limited RUQ  Result Date: 08/14/2019 CLINICAL DATA:  Upper abdominal pain for several days EXAM: ULTRASOUND ABDOMEN LIMITED RIGHT UPPER QUADRANT COMPARISON:  None. FINDINGS: Gallbladder: Gallbladder is dilated with evidence of cholelithiasis and tumefactive sludge. Gallbladder wall is not thickened. Echogenic material is noted within the gallbladder with some suggestion of vascularity. Possibility of an underlying mass lesion could not be totally excluded. Common bile duct: Diameter: 10 mm Liver: No focal lesion identified. Within normal limits in  parenchymal echogenicity. Portal vein is patent on color Doppler imaging with normal direction of blood flow towards the liver. Intrahepatic ductal dilatation is noted. Other: Some fluid is noted adjacent to the gallbladder in the peripancreatic region. IMPRESSION: Cholelithiasis and tumefactive sludge Echogenic material is noted within the gallbladder with some suggestion of vascularity. Underlying gallbladder mass could not be totally excluded. Further evaluation by means of MRI/MRCP is recommended. Prominence of common bile duct and intrahepatic ductal dilatation Peripancreatic/pericholecystic fluid collection. Electronically Signed   By: Alcide Clever M.D.   On: 08/14/2019 13:22    Procedures Procedures (including critical care time)  Medications Ordered in ED Medications  HYDROcodone-acetaminophen (NORCO/VICODIN) 5-325 MG per tablet 1 tablet (has no administration in time range)  sodium chloride 0.9 % bolus 1,000 mL (1,000 mLs Intravenous New Bag/Given 08/14/19 1236)  fentaNYL (SUBLIMAZE) injection 100 mcg (100 mcg Intravenous Given 08/14/19 1234)    ED Course  I have reviewed the triage vital signs and the nursing notes.  Pertinent labs & imaging results that were available during my care of the patient were reviewed by me and considered in my medical decision making (see chart for details).    MDM Rules/Calculators/A&P                      Patient appears to have symptomatic cholelithiasis but less likely to be cholecystitis.  WBC is normal and no fever or vomiting.  Pain is now controlled after 1 dose of IV fentanyl.  I reviewed labs and ultrasound imaging above.  Given her well appearance and that pain is controlled, I think she can have the MRI performed as an outpatient.  I discussed the possibility of mass in her gallbladder.  She will be referred to her PCP as well as outpatient general surgery.  We discussed return precautions and will give Norco for pain control, which she has  tolerated in the past. Final Clinical Impression(s) / ED Diagnoses Final diagnoses:  Upper abdominal pain  Calculus  of gallbladder without cholecystitis without obstruction    Rx / DC Orders ED Discharge Orders         Ordered    HYDROcodone-acetaminophen (NORCO) 5-325 MG tablet  Every 6 hours PRN     08/14/19 1409           Pricilla Loveless, MD 08/14/19 1410

## 2019-08-14 NOTE — ED Triage Notes (Signed)
Pt reports that stated having abd pains on Thursday that got more severe on Saturday when ate out at a restaurant. Reports pains radiates to her back from her RUQ. Hx gallstones.

## 2019-08-14 NOTE — Discharge Instructions (Signed)
Your ultrasound shows gallstones. There is also a possible mass that will need further testing, including MRI/MRCP. You need to call your primary care doctor to help set this up as an outpatient. You also need to follow up with a general surgeon for evaluation of your gallbladder.   If you develop worsening, continued, or recurrent abdominal pain, uncontrolled vomiting, fever, chest or back pain, or any other new/concerning symptoms then return to the ER for evaluation.

## 2019-08-16 ENCOUNTER — Other Ambulatory Visit: Payer: Self-pay | Admitting: Family Medicine

## 2019-08-16 DIAGNOSIS — R935 Abnormal findings on diagnostic imaging of other abdominal regions, including retroperitoneum: Secondary | ICD-10-CM

## 2019-08-19 ENCOUNTER — Other Ambulatory Visit: Payer: Self-pay

## 2019-08-19 ENCOUNTER — Other Ambulatory Visit: Payer: Medicare Other

## 2019-08-19 ENCOUNTER — Ambulatory Visit
Admission: RE | Admit: 2019-08-19 | Discharge: 2019-08-19 | Disposition: A | Discharge status: Home / Self Care | Payer: Medicare Other | Source: Ambulatory Visit | Attending: Family Medicine | Admitting: Family Medicine

## 2019-08-19 DIAGNOSIS — K838 Other specified diseases of biliary tract: Secondary | ICD-10-CM | POA: Diagnosis not present

## 2019-08-19 DIAGNOSIS — R935 Abnormal findings on diagnostic imaging of other abdominal regions, including retroperitoneum: Secondary | ICD-10-CM

## 2019-08-19 DIAGNOSIS — K828 Other specified diseases of gallbladder: Secondary | ICD-10-CM | POA: Diagnosis not present

## 2019-08-19 MED ORDER — GADOBENATE DIMEGLUMINE 529 MG/ML IV SOLN
20.0000 mL | Freq: Once | INTRAVENOUS | Status: AC | PRN
  Administered 2019-08-19: 20 mL via INTRAVENOUS

## 2019-08-21 DIAGNOSIS — N3001 Acute cystitis with hematuria: Secondary | ICD-10-CM | POA: Diagnosis not present

## 2019-08-21 DIAGNOSIS — K8000 Calculus of gallbladder with acute cholecystitis without obstruction: Secondary | ICD-10-CM | POA: Diagnosis not present

## 2019-08-21 DIAGNOSIS — N3090 Cystitis, unspecified without hematuria: Secondary | ICD-10-CM | POA: Diagnosis not present

## 2019-08-28 DIAGNOSIS — K810 Acute cholecystitis: Secondary | ICD-10-CM | POA: Diagnosis not present

## 2019-09-04 DIAGNOSIS — H3562 Retinal hemorrhage, left eye: Secondary | ICD-10-CM | POA: Diagnosis not present

## 2019-09-04 DIAGNOSIS — H43813 Vitreous degeneration, bilateral: Secondary | ICD-10-CM | POA: Diagnosis not present

## 2019-09-08 DIAGNOSIS — N3001 Acute cystitis with hematuria: Secondary | ICD-10-CM | POA: Diagnosis not present

## 2019-09-18 ENCOUNTER — Other Ambulatory Visit: Payer: Self-pay | Admitting: General Surgery

## 2019-09-18 DIAGNOSIS — K8020 Calculus of gallbladder without cholecystitis without obstruction: Secondary | ICD-10-CM | POA: Diagnosis not present

## 2019-09-20 DIAGNOSIS — H3562 Retinal hemorrhage, left eye: Secondary | ICD-10-CM | POA: Diagnosis not present

## 2019-09-20 DIAGNOSIS — H43813 Vitreous degeneration, bilateral: Secondary | ICD-10-CM | POA: Diagnosis not present

## 2019-10-10 NOTE — Progress Notes (Addendum)
[facility], [place] [address] Phone: [phone] Fax: [phone]      Your procedure is scheduled on Tuesday 10/17/2019.  Report to [facility] Main Entrance "A" at 10:30 A.M., and check in at the Admitting office.  Call this number if you have problems the morning of surgery:  [phone]  Call [phone] if you have any questions prior to your surgery date Monday-Friday 8am-4pm    Remember:  Do not eat after midnight the night before your surgery  You may drink clear liquids until 09:30am the morning of your surgery.   Clear liquids allowed are: Water, Non-Citrus Juices (without pulp), Carbonated Beverages, Clear Tea, Black Coffee Only, and Gatorade  Please complete your PRE-SURGERY ENSURE that was provided to you by 09:30am the morning of surgery.  Please, if able, drink it in one sitting. DO NOT SIP.     Take these medicines the morning of surgery with: Polyvinyl alcohol-povidine (Refresh) eye drops   As of today, STOP taking any Aspirin (unless otherwise instructed by your surgeon) and Aspirin containing products, Aleve, Naproxen, Ibuprofen, Motrin, Advil, Goody's, BC's, all herbal medications and supplements, fish oil, and all vitamins.                      Do not wear jewelry, make up, or nail polish            Do not wear lotions, powders, perfumes, or deodorant.            Do not shave 48 hours prior to surgery.            Do not bring valuables to the hospital.            [facility] is not responsible for any belongings or valuables.  Do NOT Smoke (Tobacco/Vapping) or drink Alcohol 24 hours prior to your procedure  If you use a CPAP at night, you may bring all equipment for your overnight stay.   Contacts, glasses, dentures or bridgework may not be worn into surgery.      For patients admitted to the hospital, discharge time will be determined by your treatment team.    Patients discharged the day of surgery will not be allowed to drive home, and someone needs to stay with them for 24 hours.    Special instructions:   [facility]- Preparing For Surgery  Before surgery, you can play an important role. Because skin is not sterile, your skin needs to be as free of germs as possible. You can reduce the number of germs on your skin by washing with CHG (chlorahexidine gluconate) Soap before surgery.  CHG is an antiseptic cleaner which kills germs and bonds with the skin to continue killing germs even after washing.    Oral Hygiene is also important to reduce your risk of infection.  Remember - BRUSH YOUR TEETH THE MORNING OF SURGERY WITH YOUR REGULAR TOOTHPASTE  Please do not use if you have an allergy to CHG or antibacterial soaps. If your skin becomes reddened/irritated stop using the CHG.  Do not shave (including legs and underarms) for at least 48 hours prior to first CHG shower. It is OK to shave your face.  Please follow these instructions carefully.   1. Shower the NIGHT BEFORE SURGERY and the MORNING OF SURGERY with CHG Soap.   2. If you chose to wash your hair, wash your hair first as  usual with your normal shampoo.  3. After you shampoo, rinse your hair and body thoroughly to remove the shampoo.  4. Use CHG as you would any other liquid soap. You can apply CHG directly to the skin and wash gently with a scrungie or a clean washcloth.   5. Apply the CHG Soap to your body ONLY FROM THE NECK DOWN.  Do not use on open wounds or open sores. Avoid contact with your eyes, ears, mouth and genitals (private parts). Wash Face and genitals (private parts)  with your normal soap.   6. Wash thoroughly, paying special attention to the area where your surgery will be performed.  7. Thoroughly rinse your body with warm water from the neck down.  8. DO NOT shower/wash with your normal soap after using and rinsing off the CHG Soap.  9. Pat yourself dry with a  CLEAN TOWEL.  10. Wear CLEAN PAJAMAS to bed the night before surgery, wear comfortable clothes the morning of surgery  11. Place CLEAN SHEETS on your bed the night of your first shower and DO NOT SLEEP WITH PETS.   Day of Surgery:   Do not apply any deodorants/lotions.  Please wear clean clothes to the hospital/surgery center.   Remember to brush your teeth WITH YOUR REGULAR TOOTHPASTE.   Please read over the following fact sheets that you were given.

## 2019-10-11 ENCOUNTER — Encounter (HOSPITAL_COMMUNITY): Payer: Self-pay

## 2019-10-11 ENCOUNTER — Other Ambulatory Visit: Payer: Self-pay

## 2019-10-11 ENCOUNTER — Encounter (HOSPITAL_COMMUNITY)
Admission: RE | Admit: 2019-10-11 | Discharge: 2019-10-11 | Disposition: A | Discharge status: Home / Self Care | Payer: Medicare Other | Source: Ambulatory Visit | Attending: General Surgery | Admitting: General Surgery

## 2019-10-11 DIAGNOSIS — Z01812 Encounter for preprocedural laboratory examination: Secondary | ICD-10-CM | POA: Diagnosis not present

## 2019-10-11 LAB — COMPREHENSIVE METABOLIC PANEL
ALT: 14 U/L (ref 0–44)
AST: 14 U/L — ABNORMAL LOW (ref 15–41)
Albumin: 3.6 g/dL (ref 3.5–5.0)
Alkaline Phosphatase: 48 U/L (ref 38–126)
Anion gap: 10 (ref 5–15)
BUN: 14 mg/dL (ref 8–23)
CO2: 24 mmol/L (ref 22–32)
Calcium: 9.3 mg/dL (ref 8.9–10.3)
Chloride: 108 mmol/L (ref 98–111)
Creatinine, Ser: 0.93 mg/dL (ref 0.44–1.00)
GFR calc Af Amer: >60 mL/min (ref >60)
GFR calc non Af Amer: 60 mL/min — ABNORMAL LOW (ref >60)
Glucose, Bld: 110 mg/dL — ABNORMAL HIGH (ref 70–99)
Potassium: 4.2 mmol/L (ref 3.5–5.1)
Sodium: 142 mmol/L (ref 135–145)
Total Bilirubin: 0.9 mg/dL (ref 0.3–1.2)
Total Protein: 6.6 g/dL (ref 6.5–8.1)

## 2019-10-11 LAB — CBC WITH DIFFERENTIAL/PLATELET
Abs Immature Granulocytes: 0.03 10*3/uL (ref 0.00–0.07)
Basophils Absolute: 0 10*3/uL (ref 0.0–0.1)
Basophils Relative: 1 %
Eosinophils Absolute: 0.1 10*3/uL (ref 0.0–0.5)
Eosinophils Relative: 2 %
HCT: 44.1 % (ref 36.0–46.0)
Hemoglobin: 14.1 g/dL (ref 12.0–15.0)
Immature Granulocytes: 1 %
Lymphocytes Relative: 43 %
Lymphs Abs: 2.3 10*3/uL (ref 0.7–4.0)
MCH: 28 pg (ref 26.0–34.0)
MCHC: 32 g/dL (ref 30.0–36.0)
MCV: 87.7 fL (ref 80.0–100.0)
Monocytes Absolute: 0.4 10*3/uL (ref 0.1–1.0)
Monocytes Relative: 7 %
Neutro Abs: 2.4 10*3/uL (ref 1.7–7.7)
Neutrophils Relative %: 46 %
Platelets: 221 10*3/uL (ref 150–400)
RBC: 5.03 MIL/uL (ref 3.87–5.11)
RDW: 13.7 % (ref 11.5–15.5)
WBC: 5.3 10*3/uL (ref 4.0–10.5)
nRBC: 0 % (ref 0.0–0.2)

## 2019-10-11 NOTE — Progress Notes (Signed)
PCP - Dr. Johnn Hai with [facility] Cardiologist - Denies  Chest x-ray -  EKG - 08/14/19 Stress Test - Yes believes it was in 2004 ECHO - Denies Cardiac Cath - Denies  Sleep Study - Yes has OSA CPAP - q HS  DM - Denies   ERAS Protcol - Yes PRE-SURGERY Ensure given  COVID TEST-  10/13/19  Anesthesia review:  No  Patient denies shortness of breath, fever, cough and chest pain at PAT appointment   All instructions explained to the patient, with a verbal understanding of the material. Patient agrees to go over the instructions while at home for a better understanding. Patient also instructed to self quarantine after being tested for COVID-19. The opportunity to ask questions was provided.

## 2019-10-13 ENCOUNTER — Other Ambulatory Visit (HOSPITAL_COMMUNITY)
Admission: RE | Admit: 2019-10-13 | Discharge: 2019-10-13 | Disposition: A | Discharge status: Home / Self Care | Payer: Medicare Other | Source: Ambulatory Visit | Attending: General Surgery | Admitting: General Surgery

## 2019-10-13 DIAGNOSIS — Z01812 Encounter for preprocedural laboratory examination: Secondary | ICD-10-CM | POA: Insufficient documentation

## 2019-10-13 DIAGNOSIS — Z20822 Contact with and (suspected) exposure to covid-19: Secondary | ICD-10-CM | POA: Diagnosis not present

## 2019-10-14 LAB — SARS CORONAVIRUS 2 (TAT 6-24 HRS): SARS Coronavirus 2: NEGATIVE

## 2019-10-16 NOTE — H&P (Signed)
[facility] Appointment: 09/18/2019 11:45 AM Location: [facility] Patient #: [id number] DOB: 1940-06-01 Widowed / Language: Lauren English / Race: White Female   History of Present Illness Lauren Klein MD; 09/18/2019 12:56 PM) The patient is a 79 year old female who presents for evaluation of gall stones. Pt is a lovely 79 yo F referred by Dr. Stephanie Acre for cholelithiasis and possible gallbladder mass. The patient had an episode of severe upper central abdominal pain that started April 3 and continued until April 5 and continued a few days after that. It came on suddenly after a meal of salmon, mashed potatoes, and green beans at [employer] followed by Cake and ice cream. It ramped up over a day and a half until it became unbearable. She went to the ED 4/5. She was seen to have gallstones/sludge and possible mass on the ultrasound. LFTs/lipase were normal. She had a follow up MRCP ordered by her PCP to evaluate the possible mass. This was able to tell that she has a 3.8 cm stone. There was surrounding inflammation and some compression on the common duct. No mass was seen.  She has not had any episodes of previous pain or pain since that occurrence. She has actually been very careful about what she eats and has been losing weight. She has gone back to being careful since. Both her parents had gallbladder disease.   She has no history of heart or lung disease. She also has never had a CVA.   abd u/s 08/14/2019  IMPRESSION: Cholelithiasis and tumefactive sludge  Echogenic material is noted within the gallbladder with some suggestion of vascularity. Underlying gallbladder mass could not be totally excluded. Further evaluation by means of MRI/MRCP is recommended.  Prominence of common bile duct and intrahepatic ductal dilatation  Peripancreatic/pericholecystic fluid collection.    MRCP 08/19/2019 IMPRESSION: 1. Distended gallbladder with wall thickening and  surrounding inflammation consistent with acute cholecystitis from large impacted gallstone in the gallbladder neck. 2. Minimal intrahepatic biliary dilatation with mild extrinsic mass effect on the common hepatic duct. No evidence of choledocholithiasis. 3. These results will be called to the ordering clinician or representative by the Radiologist Assistant, and communication documented in the PACS or [employer].   Labs 08/14/2019 CBC essentially normal CMET essentially normal. lipase normal.     Past Surgical History (Lauren English, [place]; 09/18/2019 12:04 PM) Appendectomy  Breast Biopsy  Bilateral. Foot Surgery  Bilateral. Hysterectomy (not due to cancer) - Partial  Knee Surgery  Bilateral. Oral Surgery  Spinal Surgery - Lower Back  Spinal Surgery - Neck   Diagnostic Studies History Lauren English, CMA; 09/18/2019 12:04 PM) Colonoscopy  5-10 years ago Mammogram  within last year Pap Smear  >5 years ago  Allergies Lauren English, [place]; 09/18/2019 12:05 PM) oxyCODONE HCl *ANALGESICS - OPIOID*  Allergies Reconciled   Medication History (Lauren English, CMA; 09/18/2019 12:05 PM) ALPRAZolam (0.5MG  Tablet, Oral) Active. Medications Reconciled  Social History Lauren English, [place]; 09/18/2019 12:04 PM) Alcohol use  Occasional alcohol use. No drug use  Tobacco use  Never smoker.  Family History Lauren English, [place]; 09/18/2019 12:04 PM) Alcohol Abuse  Father, Mother. Arthritis  Father, Mother. Cancer  Mother. Malignant Neoplasm Of Pancreas  Sister.  Pregnancy / Birth History Lauren English, [place]; 09/18/2019 12:04 PM) Age at menarche  40 years. Gravida  3 Maternal age  55-20  Other Problems Lauren English, [place]; 09/18/2019 12:04 PM) Back Pain  Bladder Problems  Cholelithiasis  Lump In Breast  Sleep Apnea  Review of Systems (Lauren English CMA; 09/18/2019 12:04 PM) General Not Present- Appetite Loss, Chills, Fatigue, Fever, Night  Sweats, Weight Gain and Weight Loss. Skin Not Present- Change in Wart/Mole, Dryness, Hives, Jaundice, New Lesions, Non-Healing Wounds, Rash and Ulcer. HEENT Present- Visual Disturbances. Not Present- Earache, Hearing Loss, Hoarseness, Nose Bleed, Oral Ulcers, Ringing in the Ears, Seasonal Allergies, Sinus Pain, Sore Throat, Wears glasses/contact lenses and Yellow Eyes. Respiratory Not Present- Bloody sputum, Chronic Cough, Difficulty Breathing, Snoring and Wheezing. Breast Not Present- Breast Mass, Breast Pain, Nipple Discharge and Skin Changes. Gastrointestinal Not Present- Abdominal Pain, Bloating, Bloody Stool, Change in Bowel Habits, Chronic diarrhea, Constipation, Difficulty Swallowing, Excessive gas, Gets full quickly at meals, Hemorrhoids, Indigestion, Nausea, Rectal Pain and Vomiting. Female Genitourinary Present- Urgency. Not Present- Frequency, Nocturia, Painful Urination and Pelvic Pain. Neurological Not Present- Decreased Memory, Fainting, Headaches, Numbness, Seizures, Tingling, Tremor, Trouble walking and Weakness. Psychiatric Not Present- Anxiety, Bipolar, Change in Sleep Pattern, Depression, Fearful and Frequent crying. Endocrine Not Present- Cold Intolerance, Excessive Hunger, Hair Changes, Heat Intolerance, Hot flashes and New Diabetes. Hematology Not Present- Blood Thinners, Easy Bruising, Excessive bleeding, Gland problems, HIV and Persistent Infections.  Vitals (Lauren English CMA; 09/18/2019 12:04 PM) 09/18/2019 12:04 PM Weight: 221.4 lb Height: 69.5in Body Surface Area: 2.17 m Body Mass Index: 32.23 kg/m  Temp.: 97.72F  Pulse: 83 (Regular)  BP: 124/86(Sitting, Left Arm, Standard)       Physical Exam Lauren Lint MD; 09/18/2019 12:56 PM) General Mental Status-Alert. General Appearance-Consistent with stated age. Hydration-Well hydrated. Voice-Normal.  Head and Neck Head-normocephalic, atraumatic with no lesions or palpable  masses. Trachea-midline. Thyroid Gland Characteristics - normal size and consistency.  Eye Eyeball - Bilateral-Extraocular movements intact. Sclera/Conjunctiva - Bilateral-No scleral icterus.  Chest and Lung Exam Chest and lung exam reveals -quiet, even and easy respiratory effort with no use of accessory muscles and on auscultation, normal breath sounds, no adventitious sounds and normal vocal resonance. Inspection Chest Wall - Normal. Back - normal.  Cardiovascular Cardiovascular examination reveals -normal heart sounds, regular rate and rhythm with no murmurs and normal pedal pulses bilaterally.  Abdomen Inspection Inspection of the abdomen reveals - No Hernias. Palpation/Percussion Palpation and Percussion of the abdomen reveal - Soft, Non Tender, No Rebound tenderness, No Rigidity (guarding) and No hepatosplenomegaly. Auscultation Auscultation of the abdomen reveals - Bowel sounds normal.  Neurologic Neurologic evaluation reveals -alert and oriented x 3 with no impairment of recent or remote memory. Mental Status-Normal.  Musculoskeletal Global Assessment -Note: no gross deformities.  Normal Exam - Left-Upper Extremity Strength Normal and Lower Extremity Strength Normal. Normal Exam - Right-Upper Extremity Strength Normal and Lower Extremity Strength Normal.  Lymphatic Head & Neck  General Head & Neck Lymphatics: Bilateral - Description - Normal. Axillary  General Axillary Region: Bilateral - Description - Normal. Tenderness - Non Tender. Femoral & Inguinal  Generalized Femoral & Inguinal Lymphatics: Bilateral - Description - No Generalized lymphadenopathy.    Assessment & Plan Lauren Lint MD; 09/18/2019 12:57 PM) SYMPTOMATIC CHOLELITHIASIS (K80.20) Impression: Pt's symptoms were classic for a gallbladder attack and she has a large stone with potential to compress the common duct.  I will plan laparoscopic cholecystectomy. She would like  to do this at the first available time.  The surgical procedure was described to the patient in detail. The patient was given educational material. I discussed the incision type and location, the location of the gallbladder, the anatomy of the bile ducts and arteries, and the typical progression of surgery. I  discussed the possibility of converting to an open operation. I advised of the risks of bleeding, infection, damage to other structures (such as the bile duct, intestine or liver), bile leak, need for other procedures or surgeries, and post op diarrhea/constipation. We discussed the risk of blood clot. We discussed the recovery period and post operative restrictions. The patient was advised against taking blood thinners the week before surgery. Current Plans Pt Education - Laparoscopic Cholecystectomy: gallbladder You are being scheduled for surgery- Our schedulers will call you.  You should hear from our office's scheduling department within 5 working days about the location, date, and time of surgery. We try to make accommodations for patient's preferences in scheduling surgery, but sometimes the OR schedule or the surgeon's schedule prevents [place] from making those accommodations.  If you have not heard from our office [phone]) in 5 working days, call the office and ask for your surgeon's nurse.  If you have other questions about your diagnosis, plan, or surgery, call the office and ask for your surgeon's nurse.    Signed electronically by Lauren Lint, MD (09/18/2019 12:57 PM)

## 2019-10-17 ENCOUNTER — Encounter (HOSPITAL_COMMUNITY): Payer: Self-pay | Admitting: General Surgery

## 2019-10-17 ENCOUNTER — Ambulatory Visit (HOSPITAL_COMMUNITY): Payer: Medicare Other | Admitting: Physician Assistant

## 2019-10-17 ENCOUNTER — Ambulatory Visit (HOSPITAL_COMMUNITY)
Admission: RE | Admit: 2019-10-17 | Discharge: 2019-10-18 | Disposition: A | Discharge status: Home / Self Care | Payer: Medicare Other | Attending: General Surgery | Admitting: General Surgery

## 2019-10-17 ENCOUNTER — Other Ambulatory Visit: Payer: Self-pay

## 2019-10-17 ENCOUNTER — Ambulatory Visit (HOSPITAL_COMMUNITY): Payer: Medicare Other | Admitting: Certified Registered Nurse Anesthetist

## 2019-10-17 ENCOUNTER — Encounter (HOSPITAL_COMMUNITY)
Admission: RE | Disposition: A | Discharge status: Home / Self Care | Payer: Self-pay | Source: Home / Self Care | Attending: General Surgery

## 2019-10-17 DIAGNOSIS — G4733 Obstructive sleep apnea (adult) (pediatric): Secondary | ICD-10-CM | POA: Diagnosis not present

## 2019-10-17 DIAGNOSIS — Z885 Allergy status to narcotic agent status: Secondary | ICD-10-CM | POA: Insufficient documentation

## 2019-10-17 DIAGNOSIS — K8010 Calculus of gallbladder with chronic cholecystitis without obstruction: Secondary | ICD-10-CM | POA: Diagnosis present

## 2019-10-17 DIAGNOSIS — K8020 Calculus of gallbladder without cholecystitis without obstruction: Secondary | ICD-10-CM | POA: Diagnosis not present

## 2019-10-17 DIAGNOSIS — R7303 Prediabetes: Secondary | ICD-10-CM | POA: Diagnosis not present

## 2019-10-17 DIAGNOSIS — Z9989 Dependence on other enabling machines and devices: Secondary | ICD-10-CM | POA: Diagnosis not present

## 2019-10-17 DIAGNOSIS — Z79899 Other long term (current) drug therapy: Secondary | ICD-10-CM | POA: Insufficient documentation

## 2019-10-17 DIAGNOSIS — M1990 Unspecified osteoarthritis, unspecified site: Secondary | ICD-10-CM | POA: Diagnosis not present

## 2019-10-17 HISTORY — PX: CHOLECYSTECTOMY: SHX55

## 2019-10-17 HISTORY — PX: INTRAOPERATIVE CHOLANGIOGRAM: SHX5230

## 2019-10-17 LAB — CBC
HCT: 41 % (ref 36.0–46.0)
Hemoglobin: 13 g/dL (ref 12.0–15.0)
MCH: 27.7 pg (ref 26.0–34.0)
MCHC: 31.7 g/dL (ref 30.0–36.0)
MCV: 87.4 fL (ref 80.0–100.0)
Platelets: 190 10*3/uL (ref 150–400)
RBC: 4.69 MIL/uL (ref 3.87–5.11)
RDW: 13.7 % (ref 11.5–15.5)
WBC: 8.3 10*3/uL (ref 4.0–10.5)
nRBC: 0 % (ref 0.0–0.2)

## 2019-10-17 LAB — CREATININE, SERUM
Creatinine, Ser: 0.85 mg/dL (ref 0.44–1.00)
GFR calc Af Amer: >60 mL/min (ref >60)
GFR calc non Af Amer: >60 mL/min (ref >60)

## 2019-10-17 LAB — GLUCOSE, CAPILLARY: Glucose-Capillary: 96 mg/dL (ref 70–99)

## 2019-10-17 SURGERY — LAPAROSCOPIC CHOLECYSTECTOMY
Anesthesia: General | Site: Abdomen

## 2019-10-17 MED ORDER — HYDROMORPHONE HCL 1 MG/ML IJ SOLN: 0.5000 mg | INTRAMUSCULAR | Status: DC | PRN

## 2019-10-17 MED ORDER — METHOCARBAMOL 500 MG PO TABS: 500.0000 mg | ORAL_TABLET | Freq: Four times a day (QID) | ORAL | Status: DC | PRN

## 2019-10-17 MED ORDER — 0.9 % SODIUM CHLORIDE (POUR BTL) OPTIME
TOPICAL | Status: DC | PRN
  Administered 2019-10-17: 1000 mL

## 2019-10-17 MED ORDER — CEFAZOLIN SODIUM-DEXTROSE 2-4 GM/100ML-% IV SOLN
2.0000 g | INTRAVENOUS | Status: AC
  Administered 2019-10-17: 2 g via INTRAVENOUS
  Filled 2019-10-17: qty 100

## 2019-10-17 MED ORDER — ALPRAZOLAM 0.25 MG PO TABS: 0.2500 mg | ORAL_TABLET | Freq: Every evening | ORAL | Status: DC | PRN

## 2019-10-17 MED ORDER — LACTATED RINGERS IV SOLN: INTRAVENOUS | Status: DC

## 2019-10-17 MED ORDER — DEXAMETHASONE SODIUM PHOSPHATE 10 MG/ML IJ SOLN
INTRAMUSCULAR | Status: DC | PRN
  Administered 2019-10-17: 5 mg via INTRAVENOUS

## 2019-10-17 MED ORDER — ACETAMINOPHEN 325 MG PO TABS: 650.0000 mg | ORAL_TABLET | Freq: Four times a day (QID) | ORAL | Status: DC | PRN

## 2019-10-17 MED ORDER — CHLORHEXIDINE GLUCONATE CLOTH 2 % EX PADS: 6.0000 | MEDICATED_PAD | Freq: Once | CUTANEOUS | Status: DC

## 2019-10-17 MED ORDER — CEFAZOLIN SODIUM-DEXTROSE 2-4 GM/100ML-% IV SOLN
2.0000 g | Freq: Three times a day (TID) | INTRAVENOUS | Status: AC
  Administered 2019-10-17: 2 g via INTRAVENOUS
  Filled 2019-10-17: qty 100

## 2019-10-17 MED ORDER — SCOPOLAMINE 1 MG/3DAYS TD PT72
1.0000 | MEDICATED_PATCH | TRANSDERMAL | Status: DC
  Administered 2019-10-17: 1.5 mg via TRANSDERMAL
  Filled 2019-10-17: qty 1

## 2019-10-17 MED ORDER — HYDROCODONE-ACETAMINOPHEN 5-325 MG PO TABS: 1.0000 | ORAL_TABLET | Freq: Four times a day (QID) | ORAL | Status: DC | PRN

## 2019-10-17 MED ORDER — SUCCINYLCHOLINE CHLORIDE 20 MG/ML IJ SOLN
INTRAMUSCULAR | Status: DC | PRN
  Administered 2019-10-17: 120 mg via INTRAVENOUS

## 2019-10-17 MED ORDER — FENTANYL CITRATE (PF) 100 MCG/2ML IJ SOLN
25.0000 ug | INTRAMUSCULAR | Status: DC | PRN
  Administered 2019-10-17 (×3): 50 ug via INTRAVENOUS

## 2019-10-17 MED ORDER — FENTANYL CITRATE (PF) 100 MCG/2ML IJ SOLN
INTRAMUSCULAR | Status: AC
  Filled 2019-10-17: qty 2

## 2019-10-17 MED ORDER — TRAMADOL HCL 50 MG PO TABS: 50.0000 mg | ORAL_TABLET | Freq: Four times a day (QID) | ORAL | Status: DC | PRN

## 2019-10-17 MED ORDER — KETOROLAC TROMETHAMINE 30 MG/ML IJ SOLN
30.0000 mg | Freq: Four times a day (QID) | INTRAMUSCULAR | Status: AC
  Administered 2019-10-17 – 2019-10-18 (×2): 30 mg via INTRAVENOUS
  Filled 2019-10-17 (×2): qty 1

## 2019-10-17 MED ORDER — KETOROLAC TROMETHAMINE 30 MG/ML IJ SOLN: 30.0000 mg | Freq: Four times a day (QID) | INTRAMUSCULAR | Status: DC | PRN

## 2019-10-17 MED ORDER — ONDANSETRON HCL 4 MG/2ML IJ SOLN
INTRAMUSCULAR | Status: DC | PRN
  Administered 2019-10-17: 4 mg via INTRAVENOUS

## 2019-10-17 MED ORDER — LIDOCAINE-EPINEPHRINE 1 %-1:100000 IJ SOLN
INTRAMUSCULAR | Status: DC | PRN
  Administered 2019-10-17: 5.5 mL

## 2019-10-17 MED ORDER — BUPIVACAINE HCL (PF) 0.25 % IJ SOLN
INTRAMUSCULAR | Status: DC | PRN
  Administered 2019-10-17: 5.5 mL

## 2019-10-17 MED ORDER — PROCHLORPERAZINE MALEATE 10 MG PO TABS
10.0000 mg | ORAL_TABLET | Freq: Four times a day (QID) | ORAL | Status: DC | PRN
  Filled 2019-10-17: qty 1

## 2019-10-17 MED ORDER — POLYVINYL ALCOHOL 1.4 % OP SOLN
1.0000 [drp] | Freq: Every day | OPHTHALMIC | Status: DC
  Administered 2019-10-18: 1 [drp] via OPHTHALMIC
  Filled 2019-10-17: qty 15

## 2019-10-17 MED ORDER — LIDOCAINE-EPINEPHRINE 1 %-1:100000 IJ SOLN
INTRAMUSCULAR | Status: AC
  Filled 2019-10-17: qty 1

## 2019-10-17 MED ORDER — ENSURE PRE-SURGERY PO LIQD: 296.0000 mL | Freq: Once | ORAL | Status: DC

## 2019-10-17 MED ORDER — ONDANSETRON HCL 4 MG/2ML IJ SOLN: 4.0000 mg | Freq: Four times a day (QID) | INTRAMUSCULAR | Status: DC | PRN

## 2019-10-17 MED ORDER — DIPHENHYDRAMINE HCL 50 MG/ML IJ SOLN: 12.5000 mg | Freq: Four times a day (QID) | INTRAMUSCULAR | Status: DC | PRN

## 2019-10-17 MED ORDER — FENTANYL CITRATE (PF) 250 MCG/5ML IJ SOLN
INTRAMUSCULAR | Status: DC | PRN
  Administered 2019-10-17: 100 ug via INTRAVENOUS
  Administered 2019-10-17: 50 ug via INTRAVENOUS
  Administered 2019-10-17 (×4): 25 ug via INTRAVENOUS

## 2019-10-17 MED ORDER — POLYVINYL ALCOHOL-POVIDONE 1.4-0.6 % OP SOLN: 1.0000 [drp] | Freq: Every day | OPHTHALMIC | Status: DC

## 2019-10-17 MED ORDER — HYDROCODONE-ACETAMINOPHEN 5-325 MG PO TABS: 1.0000 | ORAL_TABLET | Freq: Four times a day (QID) | ORAL | 0 refills | Status: DC | PRN

## 2019-10-17 MED ORDER — BUPIVACAINE HCL (PF) 0.25 % IJ SOLN
INTRAMUSCULAR | Status: AC
  Filled 2019-10-17: qty 30

## 2019-10-17 MED ORDER — LIDOCAINE HCL (CARDIAC) PF 100 MG/5ML IV SOSY
PREFILLED_SYRINGE | INTRAVENOUS | Status: DC | PRN
  Administered 2019-10-17: 70 mg via INTRAVENOUS

## 2019-10-17 MED ORDER — SUGAMMADEX SODIUM 200 MG/2ML IV SOLN
INTRAVENOUS | Status: DC | PRN
  Administered 2019-10-17: 200 mg via INTRAVENOUS

## 2019-10-17 MED ORDER — HYDROMORPHONE HCL 1 MG/ML IJ SOLN
INTRAMUSCULAR | Status: AC
  Filled 2019-10-17: qty 1

## 2019-10-17 MED ORDER — PROCHLORPERAZINE EDISYLATE 10 MG/2ML IJ SOLN: 5.0000 mg | Freq: Four times a day (QID) | INTRAMUSCULAR | Status: DC | PRN

## 2019-10-17 MED ORDER — ACETAMINOPHEN 500 MG PO TABS
1000.0000 mg | ORAL_TABLET | ORAL | Status: AC
  Administered 2019-10-17: 1000 mg via ORAL
  Filled 2019-10-17: qty 2

## 2019-10-17 MED ORDER — ONDANSETRON HCL 4 MG/2ML IJ SOLN: 4.0000 mg | Freq: Once | INTRAMUSCULAR | Status: DC | PRN

## 2019-10-17 MED ORDER — ENOXAPARIN SODIUM 40 MG/0.4ML ~~LOC~~ SOLN
40.0000 mg | SUBCUTANEOUS | Status: DC
  Administered 2019-10-18: 40 mg via SUBCUTANEOUS
  Filled 2019-10-17: qty 0.4

## 2019-10-17 MED ORDER — SIMETHICONE 80 MG PO CHEW: 40.0000 mg | CHEWABLE_TABLET | Freq: Four times a day (QID) | ORAL | Status: DC | PRN

## 2019-10-17 MED ORDER — ONDANSETRON 4 MG PO TBDP: 4.0000 mg | ORAL_TABLET | Freq: Four times a day (QID) | ORAL | Status: DC | PRN

## 2019-10-17 MED ORDER — ROCURONIUM BROMIDE 10 MG/ML (PF) SYRINGE
PREFILLED_SYRINGE | INTRAVENOUS | Status: DC | PRN
  Administered 2019-10-17: 50 mg via INTRAVENOUS
  Administered 2019-10-17: 10 mg via INTRAVENOUS

## 2019-10-17 MED ORDER — HYDROMORPHONE HCL 1 MG/ML IJ SOLN
0.2500 mg | INTRAMUSCULAR | Status: DC | PRN
  Administered 2019-10-17 (×3): 0.5 mg via INTRAVENOUS

## 2019-10-17 MED ORDER — ORAL CARE MOUTH RINSE: 15.0000 mL | Freq: Once | OROMUCOSAL | Status: AC

## 2019-10-17 MED ORDER — PROPOFOL 10 MG/ML IV BOLUS
INTRAVENOUS | Status: DC | PRN
  Administered 2019-10-17: 140 mg via INTRAVENOUS

## 2019-10-17 MED ORDER — DOCUSATE SODIUM 100 MG PO CAPS
100.0000 mg | ORAL_CAPSULE | Freq: Two times a day (BID) | ORAL | Status: DC
  Administered 2019-10-17 – 2019-10-18 (×2): 100 mg via ORAL
  Filled 2019-10-17 (×2): qty 1

## 2019-10-17 MED ORDER — CHLORHEXIDINE GLUCONATE 0.12 % MT SOLN
15.0000 mL | Freq: Once | OROMUCOSAL | Status: AC
  Administered 2019-10-17: 15 mL via OROMUCOSAL
  Filled 2019-10-17: qty 15

## 2019-10-17 MED ORDER — ACETAMINOPHEN 650 MG RE SUPP: 650.0000 mg | Freq: Four times a day (QID) | RECTAL | Status: DC | PRN

## 2019-10-17 MED ORDER — FENTANYL CITRATE (PF) 250 MCG/5ML IJ SOLN
INTRAMUSCULAR | Status: AC
  Filled 2019-10-17: qty 5

## 2019-10-17 MED ORDER — DIPHENHYDRAMINE HCL 12.5 MG/5ML PO ELIX: 12.5000 mg | ORAL_SOLUTION | Freq: Four times a day (QID) | ORAL | Status: DC | PRN

## 2019-10-17 MED ORDER — SODIUM CHLORIDE 0.9 % IR SOLN
Status: DC | PRN
  Administered 2019-10-17: 1

## 2019-10-17 SURGICAL SUPPLY — 44 items
APPLIER CLIP ROT 10 11.4 M/L (STAPLE) ×3
BLADE CLIPPER SURG (BLADE) IMPLANT
CANISTER SUCT 3000ML PPV (MISCELLANEOUS) ×3 IMPLANT
CHLORAPREP W/TINT 26 (MISCELLANEOUS) ×3 IMPLANT
CLIP APPLIE ROT 10 11.4 M/L (STAPLE) ×2 IMPLANT
CLIP VESOLOCK XL 6/CT (CLIP) IMPLANT
COVER SURGICAL LIGHT HANDLE (MISCELLANEOUS) ×3 IMPLANT
DECANTER SPIKE VIAL GLASS SM (MISCELLANEOUS) ×3 IMPLANT
DERMABOND ADVANCED (GAUZE/BANDAGES/DRESSINGS) ×1
DERMABOND ADVANCED .7 DNX12 (GAUZE/BANDAGES/DRESSINGS) ×2 IMPLANT
ELECT REM PT RETURN 9FT ADLT (ELECTROSURGICAL) ×3
ELECTRODE REM PT RTRN 9FT ADLT (ELECTROSURGICAL) ×2 IMPLANT
ENDOLOOP SUT PDS II  0 18 (SUTURE) ×6
ENDOLOOP SUT PDS II 0 18 (SUTURE) ×4 IMPLANT
FILTER SMOKE EVAC LAPAROSHD (FILTER) IMPLANT
GLOVE BIO SURGEON STRL SZ 6 (GLOVE) ×3 IMPLANT
GLOVE INDICATOR 6.5 STRL GRN (GLOVE) ×3 IMPLANT
GOWN STRL REUS W/ TWL LRG LVL3 (GOWN DISPOSABLE) ×4 IMPLANT
GOWN STRL REUS W/TWL 2XL LVL3 (GOWN DISPOSABLE) ×3 IMPLANT
GOWN STRL REUS W/TWL LRG LVL3 (GOWN DISPOSABLE) ×6
HEMOSTAT SNOW SURGICEL 2X4 (HEMOSTASIS) ×3 IMPLANT
KIT BASIN OR (CUSTOM PROCEDURE TRAY) ×3 IMPLANT
KIT TURNOVER KIT B (KITS) ×3 IMPLANT
L-HOOK LAP DISP 36CM (ELECTROSURGICAL) ×3
LHOOK LAP DISP 36CM (ELECTROSURGICAL) ×2 IMPLANT
NS IRRIG 1000ML POUR BTL (IV SOLUTION) ×3 IMPLANT
PAD ARMBOARD 7.5X6 YLW CONV (MISCELLANEOUS) ×3 IMPLANT
PENCIL BUTTON HOLSTER BLD 10FT (ELECTRODE) ×3 IMPLANT
POUCH RETRIEVAL ECOSAC 10 (ENDOMECHANICALS) ×2 IMPLANT
POUCH RETRIEVAL ECOSAC 10MM (ENDOMECHANICALS) ×3
SCISSORS LAP 5X35 DISP (ENDOMECHANICALS) ×3 IMPLANT
SET IRRIG TUBING LAPAROSCOPIC (IRRIGATION / IRRIGATOR) ×3 IMPLANT
SET TUBE SMOKE EVAC HIGH FLOW (TUBING) ×3 IMPLANT
SLEEVE ENDOPATH XCEL 5M (ENDOMECHANICALS) ×3 IMPLANT
SPECIMEN JAR SMALL (MISCELLANEOUS) ×3 IMPLANT
SUT MNCRL AB 4-0 PS2 18 (SUTURE) ×3 IMPLANT
SUT VICRYL 0 ENDOLOOP (SUTURE) IMPLANT
TOWEL GREEN STERILE (TOWEL DISPOSABLE) ×3 IMPLANT
TOWEL GREEN STERILE FF (TOWEL DISPOSABLE) ×3 IMPLANT
TRAY LAPAROSCOPIC MC (CUSTOM PROCEDURE TRAY) ×3 IMPLANT
TROCAR XCEL BLUNT TIP 100MML (ENDOMECHANICALS) ×3 IMPLANT
TROCAR XCEL NON-BLD 11X100MML (ENDOMECHANICALS) ×3 IMPLANT
TROCAR XCEL NON-BLD 5MMX100MML (ENDOMECHANICALS) ×3 IMPLANT
WATER STERILE IRR 1000ML POUR (IV SOLUTION) ×3 IMPLANT

## 2019-10-17 NOTE — Transfer of Care (Signed)
Immediate Anesthesia Transfer of Care Note  Patient: Lauren English  Procedure(s) Performed: LAPAROSCOPIC CHOLECYSTECTOMY (N/A Abdomen) POSSIBLE INTRAOPERATIVE CHOLANGIOGRAM (N/A )  Patient Location: PACU  Anesthesia Type:General  Level of Consciousness: awake, alert , oriented, drowsy and patient cooperative  Airway & Oxygen Therapy: Patient Spontanous Breathing and Patient connected to face mask oxygen  Post-op Assessment: Report given to RN and Post -op Vital signs reviewed and stable  Post vital signs: Reviewed and stable  Last Vitals:  Vitals Value Taken Time  BP    Temp    Pulse 75 10/17/19 1510  Resp 15 10/17/19 1510  SpO2 100 % 10/17/19 1510  Vitals shown include unvalidated device data.  Last Pain:  Vitals:   10/17/19 1048  TempSrc:   PainSc: 0-No pain      Patients Stated Pain Goal: 5 (10/17/19 1048)  Complications: No apparent anesthesia complications

## 2019-10-17 NOTE — Anesthesia Preprocedure Evaluation (Addendum)
Anesthesia Evaluation  Patient identified by MRN, date of birth, ID band Patient awake  General Assessment Comment:Vocal cord issue after anesthesia and surgery. Oral 7.5 ETT placed. Now resolved.   Reviewed: Allergy & Precautions, NPO status , Patient's Chart, lab work & pertinent test results  History of Anesthesia Complications (+) PONV, DIFFICULT AIRWAY and history of anesthetic complications  Airway Mallampati: III  TM Distance: >3 FB Neck ROM: Limited    Dental no notable dental hx.    Pulmonary sleep apnea and Continuous Positive Airway Pressure Ventilation ,    Pulmonary exam normal breath sounds clear to auscultation       Cardiovascular negative cardio ROS Normal cardiovascular exam Rhythm:Regular Rate:Normal  ECG: SR, rate 64   Neuro/Psych PSYCHIATRIC DISORDERS    GI/Hepatic negative GI ROS, Neg liver ROS,   Endo/Other  negative endocrine ROS  Renal/GU negative Renal ROS     Musculoskeletal  (+) Arthritis , Chronic back pain   Abdominal (+) + obese,   Peds  Hematology negative hematology ROS (+)   Anesthesia Other Findings SYMPTOMATIC CHOLELITHIASIS  Reproductive/Obstetrics                            Anesthesia Physical Anesthesia Plan  ASA: II  Anesthesia Plan: General   Post-op Pain Management:    Induction: Intravenous  PONV Risk Score and Plan: 4 or greater and Ondansetron, Dexamethasone and Treatment may vary due to age or medical condition  Airway Management Planned: Oral ETT and Video Laryngoscope Planned  Additional Equipment:   Intra-op Plan:   Post-operative Plan: Extubation in OR  Informed Consent: I have reviewed the patients History and Physical, chart, labs and discussed the procedure including the risks, benefits and alternatives for the proposed anesthesia with the patient or authorized representative who has indicated his/her understanding and  acceptance.     Dental advisory given  Plan Discussed with: CRNA  Anesthesia Plan Comments: (7.0 oral ETT)       Anesthesia Quick Evaluation

## 2019-10-17 NOTE — Interval H&P Note (Signed)
History and Physical Interval Note:  10/17/2019 11:29 AM  Lauren English  has presented today for surgery, with the diagnosis of SYMPTOMATIC CHOLELITHIASIS.  The various methods of treatment have been discussed with the patient and family. After consideration of risks, benefits and other options for treatment, the patient has consented to  Procedure(s): LAPAROSCOPIC CHOLECYSTECTOMY (N/A) POSSIBLE INTRAOPERATIVE CHOLANGIOGRAM (N/A) as a surgical intervention.  The patient's history has been reviewed, patient examined, no change in status, stable for surgery.  I have reviewed the patient's chart and labs.  Questions were answered to the patient's satisfaction.     Almond Lint

## 2019-10-17 NOTE — Anesthesia Postprocedure Evaluation (Signed)
Anesthesia Post Note  Patient: Lauren English  Procedure(s) Performed: LAPAROSCOPIC CHOLECYSTECTOMY (N/A Abdomen) POSSIBLE INTRAOPERATIVE CHOLANGIOGRAM (N/A )     Patient location during evaluation: PACU Anesthesia Type: General Level of consciousness: awake and alert and oriented Pain management: pain level controlled Vital Signs Assessment: post-procedure vital signs reviewed and stable Respiratory status: spontaneous breathing, nonlabored ventilation and respiratory function stable Cardiovascular status: blood pressure returned to baseline and stable Postop Assessment: no apparent nausea or vomiting Anesthetic complications: no    Last Vitals:  Vitals:   10/17/19 1037 10/17/19 1510  BP: (!) 161/56 (!) 144/60  Pulse: 75 75  Resp: 20 15  Temp: 36.9 C (!) 36.2 C  SpO2: 96% 100%    Last Pain:  Vitals:   10/17/19 1510  TempSrc:   PainSc: 8                  Marcele Kosta A.

## 2019-10-17 NOTE — Anesthesia Procedure Notes (Signed)
Procedure Name: Intubation Date/Time: 10/17/2019 1:07 PM Performed by: Mal Amabile, MD Pre-anesthesia Checklist: Patient identified, Emergency Drugs available, Suction available and Patient being monitored Patient Re-evaluated:Patient Re-evaluated prior to induction Oxygen Delivery Method: Circle system utilized Preoxygenation: Pre-oxygenation with 100% oxygen Induction Type: IV induction Ventilation: Mask ventilation without difficulty Laryngoscope Size: Glidescope and 3 Grade View: Grade I Tube type: Oral Tube size: 7.0 mm Number of attempts: 1 Airway Equipment and Method: Stylet and Video-laryngoscopy Placement Confirmation: ETT inserted through vocal cords under direct vision,  positive ETCO2 and breath sounds checked- equal and bilateral Secured at: 21 cm Tube secured with: Tape Dental Injury: Teeth and Oropharynx as per pre-operative assessment  Difficulty Due To: Difficulty was anticipated Comments: Mask ventilation by CRNA with no issues. Intubation by MDA with no issues.

## 2019-10-17 NOTE — Op Note (Signed)
Laparoscopic Cholecystectomy  Indications: This patient presents with chronic calculous cholecystitis with >3 cm gallstone and will undergo laparoscopic cholecystectomy.  Pre-operative Diagnosis: chronic calculous cholecystitis  Post-operative Diagnosis: Severe chronic calculous cholecystitis  Surgeon: Stark Klein   Assistants: Ewell Poe, RNFA  Anesthesia: General endotracheal anesthesia and local  ASA Class: 2  Procedure Details  The patient was seen again in the Holding Room. The risks, benefits, complications, treatment options, and expected outcomes were discussed with the patient. The possibilities of  bleeding, recurrent infection, damage to nearby structures, the need for additional procedures, failure to diagnose a condition, the possible need to convert to an open procedure, and creating a complication requiring transfusion or operation were discussed with the patient. The likelihood of improving the patient's symptoms with return to their baseline status is good.    The patient and/or family concurred with the proposed plan, giving informed consent. The site of surgery properly noted. The patient was taken to Operating Room, and the procedure verified as Laparoscopic Cholecystectomy with possible Intraoperative Cholangiogram. A Time Out was held and the above information confirmed.  Prior to the induction of general anesthesia, antibiotic prophylaxis was administered. General endotracheal anesthesia was then administered and tolerated well. After the induction, the abdomen was prepped with Chloraprep and draped in the sterile fashion. The patient was positioned in the supine position.  Local anesthetic agent was injected into the skin near the umbilicus and an incision made. We dissected down to the abdominal fascia with blunt dissection.  The fascia was incised vertically and we entered the peritoneal cavity bluntly.  A pursestring suture of 0-Vicryl was placed around the  fascial opening.  The Hasson cannula was inserted and secured with the stay suture.  Pneumoperitoneum was then created with CO2 and tolerated well without any adverse changes in the patient's vital signs. An 11-mm port was placed in the subxiphoid position.  Two 5-mm ports were placed in the right upper quadrant. All skin incisions were infiltrated with a local anesthetic agent before making the incision and placing the trocars.   We positioned the patient in reverse Trendelenburg, tilted slightly to the patient's left.  The gallbladder was not immediately seen.  The omentum had stuck to the gallbladder.  Also, the proximal duodenum had some filmy adhesions to the gallbladder.  The gallbladder had hard woody inflammation and a giant stone in the fundus.  The gallbladder was identified, and the fundus was pushed cephalad with the stone. Adhesions were lysed bluntly and with the electrocautery where indicated, taking care not to injure any adjacent organs or viscus. The infundibulum was grasped and retracted laterally, exposing the peritoneum overlying the triangle of Calot. This area also had dense inflammation.  Because of this and because we could not grasp the gallbladder for lateral retraction, the gallbladder was taken dome down.  The cystic artery was coagulated.    The posterior wall of the gallbladder was attenuated and breaking apart.  As the gallbladder narrowed down at the infundibulum, the wall continued to be fragile.  Rather than compromising the cystic duct/common duct interface, an Endoloop was placed around the base of the gallbladder.  The gallbladder was divided with the cautery.  The gallbladder and the large stone were placed into the bag.   The gallbladder and Ecosac were then removed through the umbilical port site.  The liver bed was irrigated and inspected. Hemostasis was achieved with the electrocautery. Copious irrigation was utilized and was repeatedly aspirated until clear.  A sponge  was placed on the gallbladder fossa and the stump of the cystic duct.  No bile leakage was seen.  This was carefully reexamined.  A second loop was then placed for reinforcement.  SNOW hemostatic agent was placed in the gallbladder fossa.    We again inspected the right upper quadrant for hemostasis.  A four quadrant inspection was peformed and no bleeding, bile, or other gross pathology was seen.  Pneumoperitoneum was released as we removed the trocars.   The pursestring suture was used to close the umbilical fascia.  A second stitch was placed inferiorly for reinforcement.  4-0 Monocryl was used to close the skin.   The skin was cleaned and dry, and Dermabond was applied. The patient was then extubated and brought to the recovery room in stable condition. Instrument, sponge, and needle counts were correct at closure and at the conclusion of the case.   Findings: Large stone, dense chronic inflammation of gallbladder.  .    Estimated Blood Loss: <50 mL         Drains: none          Specimens: Gallbladder to pathology       Complications: None; patient tolerated the procedure well.         Disposition: PACU - hemodynamically stable.         Condition: stable

## 2019-10-17 NOTE — Discharge Instructions (Addendum)
[facility] °Office Phone Number [phone] ° ° POST OP INSTRUCTIONS ° °Always review your discharge instruction sheet given to you by the facility where your surgery was performed. ° °IF YOU HAVE DISABILITY OR FAMILY LEAVE FORMS, YOU MUST BRING THEM TO THE OFFICE FOR PROCESSING.  DO NOT GIVE THEM TO YOUR DOCTOR. ° °1. A prescription for pain medication may be given to you upon discharge.  Take your pain medication as prescribed, if needed.  If narcotic pain medicine is not needed, then you may take acetaminophen (Tylenol) or ibuprofen (Advil) as needed. °2. Take your usually prescribed medications unless otherwise directed °3. If you need a refill on your pain medication, please contact your pharmacy.  They will contact our office to request authorization.  Prescriptions will not be filled after 5pm or on week-ends. °4. You should eat very light the first 24 hours after surgery, such as soup, crackers, pudding, etc.  Resume your normal diet the day after surgery °5. It is common to experience some constipation if taking pain medication after surgery.  Increasing fluid intake and taking a stool softener will usually help or prevent this problem from occurring.  A mild laxative (Milk of Magnesia or Miralax) should be taken according to package directions if there are no bowel movements after 48 hours. °6. You may shower in 48 hours.  The surgical glue will flake off in 2-3 weeks.   °7. ACTIVITIES:  No strenuous activity or heavy lifting for 2 week.   °a. You may drive when you no longer are taking prescription pain medication, you can comfortably wear a seatbelt, and you can safely maneuver your car and apply brakes. °b. RETURN TO WORK:  __________n/a_______________ °You should see your doctor in the office for a follow-up appointment approximately three-four weeks after your surgery.   ° °WHEN TO CALL YOUR DOCTOR: °1. Fever over 101.0 °2. Nausea and/or vomiting. °3. Extreme swelling or  bruising. °4. Continued bleeding from incision. °5. Increased pain, redness, or drainage from the incision. ° °The clinic staff is available to answer your questions during regular business hours.  Please don’t hesitate to call and ask to speak to one of the nurses for clinical concerns.  If you have a medical emergency, go to the nearest emergency room or call 911.  A surgeon from [facility] is always on call at the hospital. ° °For further questions, please visit [email or web address]  ° °

## 2019-10-17 NOTE — Progress Notes (Signed)
Transferred from PACU, alert, oriented 

## 2019-10-18 DIAGNOSIS — M1990 Unspecified osteoarthritis, unspecified site: Secondary | ICD-10-CM | POA: Diagnosis not present

## 2019-10-18 DIAGNOSIS — Z79899 Other long term (current) drug therapy: Secondary | ICD-10-CM | POA: Diagnosis not present

## 2019-10-18 DIAGNOSIS — Z885 Allergy status to narcotic agent status: Secondary | ICD-10-CM | POA: Diagnosis not present

## 2019-10-18 DIAGNOSIS — K8010 Calculus of gallbladder with chronic cholecystitis without obstruction: Secondary | ICD-10-CM | POA: Diagnosis not present

## 2019-10-18 LAB — SURGICAL PATHOLOGY

## 2019-10-18 LAB — CBC
HCT: 37.1 % (ref 36.0–46.0)
Hemoglobin: 12 g/dL (ref 12.0–15.0)
MCH: 28.2 pg (ref 26.0–34.0)
MCHC: 32.3 g/dL (ref 30.0–36.0)
MCV: 87.3 fL (ref 80.0–100.0)
Platelets: 178 10*3/uL (ref 150–400)
RBC: 4.25 MIL/uL (ref 3.87–5.11)
RDW: 13.7 % (ref 11.5–15.5)
WBC: 6.9 10*3/uL (ref 4.0–10.5)
nRBC: 0 % (ref 0.0–0.2)

## 2019-10-18 LAB — COMPREHENSIVE METABOLIC PANEL
ALT: 46 U/L — ABNORMAL HIGH (ref 0–44)
AST: 42 U/L — ABNORMAL HIGH (ref 15–41)
Albumin: 2.8 g/dL — ABNORMAL LOW (ref 3.5–5.0)
Alkaline Phosphatase: 39 U/L (ref 38–126)
Anion gap: 7 (ref 5–15)
BUN: 14 mg/dL (ref 8–23)
CO2: 25 mmol/L (ref 22–32)
Calcium: 8.6 mg/dL — ABNORMAL LOW (ref 8.9–10.3)
Chloride: 106 mmol/L (ref 98–111)
Creatinine, Ser: 0.86 mg/dL (ref 0.44–1.00)
GFR calc Af Amer: >60 mL/min (ref >60)
GFR calc non Af Amer: >60 mL/min (ref >60)
Glucose, Bld: 123 mg/dL — ABNORMAL HIGH (ref 70–99)
Potassium: 4.4 mmol/L (ref 3.5–5.1)
Sodium: 138 mmol/L (ref 135–145)
Total Bilirubin: 0.6 mg/dL (ref 0.3–1.2)
Total Protein: 5.2 g/dL — ABNORMAL LOW (ref 6.5–8.1)

## 2019-10-18 NOTE — Discharge Summary (Signed)
Physician Discharge Summary  Patient ID: Lauren English MRN: [id number] DOB/AGE: 77-Jul-1942 77 y.o.  Admit date: 10/17/2019 Discharge date: 10/18/2019  Admission Diagnoses: Patient Active Problem List   Diagnosis Date Noted   Chronic calculous cholecystitis 10/17/2019   Abscess of knee, left 01/25/2017   Arthrofibrosis of total knee arthroplasty ([place]) 03/25/2015   Quadriceps tendon rupture 12/09/2014   OA (osteoarthritis) of knee 11/26/2014    Discharge Diagnoses:  Active Problems:   Chronic calculous cholecystitis and same as above  Discharged Condition: stable  Hospital Course:  Pt was admitted to the floor following lap chole for chronic cholecystitis 10/17/2019.  Her gallbladder was significantly inflamed and the operation was difficult.  She had significant pain in the recovery room and it took quite a few doses of IV pain medication to get her to a reasonable level.  She continued to need pain medication into the evening.  She was then more comfortable and then was able to ambulate.  She tolerated her diet.  She was discharged on POD 1 in good condition.    Consults: None  Significant Diagnostic Studies: labs: mild bump in AST and ALT,  Alk phos and t bili normal on POD 1.  HCT 37.1.    Treatments: surgery: see above  Discharge Exam: Blood pressure (!) 113/55, pulse (!) 55, temperature 97.9 F (36.6 C), temperature source Oral, resp. rate 16, height 5' 10"  (1.778 m), weight 97.1 kg, SpO2 98 %. General appearance: alert and no distress Resp: breathing comfortably GI: soft, non distended.  umbilical incision bruised.  approp tender at incisions. Extremities: extremities normal, atraumatic, no cyanosis or edema  Disposition: Discharge disposition: 01-Home or Self Care       Discharge Instructions    Call MD for:  difficulty breathing, headache or visual disturbances   Complete by: As directed    Call MD for:  difficulty breathing, headache or visual disturbances    Complete by: As directed    Call MD for:  hives   Complete by: As directed    Call MD for:  persistant nausea and vomiting   Complete by: As directed    Call MD for:  persistant nausea and vomiting   Complete by: As directed    Call MD for:  redness, tenderness, or signs of infection (pain, swelling, redness, odor or green/yellow discharge around incision site)   Complete by: As directed    Call MD for:  redness, tenderness, or signs of infection (pain, swelling, redness, odor or green/yellow discharge around incision site)   Complete by: As directed    Call MD for:  severe uncontrolled pain   Complete by: As directed    Call MD for:  severe uncontrolled pain   Complete by: As directed    Call MD for:  temperature >100.4   Complete by: As directed    Call MD for:  temperature >100.4   Complete by: As directed    Diet - low sodium heart healthy   Complete by: As directed    Diet - low sodium heart healthy   Complete by: As directed    Increase activity slowly   Complete by: As directed    Increase activity slowly   Complete by: As directed      Allergies as of 10/18/2019      Reactions   Oxycodone Rash, Itching      Medication List    TAKE these medications   Acetyl L-Carnitine 500 MG Caps Take 500 mg  by mouth daily.   ALPRAZolam 0.5 MG tablet Commonly known as: XANAX Take 0.25-0.5 mg by mouth at bedtime as needed for sleep.   b complex vitamins tablet Take 1 tablet by mouth daily.   calcium carbonate 1500 (600 Ca) MG Tabs tablet Commonly known as: OSCAL Take 750 mg by mouth 2 (two) times daily with a meal.   docusate sodium 100 MG capsule Commonly known as: COLACE Take 100 mg by mouth 2 (two) times daily.   HYDROcodone-acetaminophen 5-325 MG tablet Commonly known as: Norco Take 1 tablet by mouth every 6 (six) hours as needed for severe pain.   OMEGA-3 PO Take 1 capsule by mouth 2 (two) times daily.   Refresh 1.4-0.6 % ophthalmic solution Generic drug:  polyvinyl alcohol-povidone Place 1 drop into both eyes daily.   TURMERIC CURCUMIN PO Take 1 capsule by mouth in the morning and at bedtime.   Vitamin D3 50 MCG (2000 UT) Tabs Take 2,000 mg by mouth daily.      Follow-up Information    Stark Klein, MD In 2 weeks.   Specialty: General Surgery Contact information: [address] [place] [address] [phone]           Signed: Stark Klein 10/18/2019, 10:19 AM

## 2019-10-18 NOTE — Progress Notes (Signed)
Patient discharged to home with instructions. 

## 2019-12-01 ENCOUNTER — Other Ambulatory Visit: Payer: Self-pay | Admitting: Family Medicine

## 2019-12-01 DIAGNOSIS — Z1231 Encounter for screening mammogram for malignant neoplasm of breast: Secondary | ICD-10-CM

## 2019-12-11 ENCOUNTER — Other Ambulatory Visit: Payer: Self-pay

## 2019-12-11 ENCOUNTER — Ambulatory Visit
Admission: RE | Admit: 2019-12-11 | Discharge: 2019-12-11 | Disposition: A | Discharge status: Home / Self Care | Payer: Medicare Other | Source: Ambulatory Visit | Attending: Family Medicine | Admitting: Family Medicine

## 2019-12-11 DIAGNOSIS — Z1231 Encounter for screening mammogram for malignant neoplasm of breast: Secondary | ICD-10-CM | POA: Diagnosis not present

## 2019-12-13 DIAGNOSIS — G4730 Sleep apnea, unspecified: Secondary | ICD-10-CM | POA: Diagnosis not present

## 2020-01-02 DIAGNOSIS — N390 Urinary tract infection, site not specified: Secondary | ICD-10-CM | POA: Diagnosis not present

## 2020-01-09 DIAGNOSIS — H26493 Other secondary cataract, bilateral: Secondary | ICD-10-CM | POA: Diagnosis not present

## 2020-01-09 DIAGNOSIS — H04123 Dry eye syndrome of bilateral lacrimal glands: Secondary | ICD-10-CM | POA: Diagnosis not present

## 2020-01-09 DIAGNOSIS — H43813 Vitreous degeneration, bilateral: Secondary | ICD-10-CM | POA: Diagnosis not present

## 2020-01-09 DIAGNOSIS — H40013 Open angle with borderline findings, low risk, bilateral: Secondary | ICD-10-CM | POA: Diagnosis not present

## 2020-01-16 DIAGNOSIS — M542 Cervicalgia: Secondary | ICD-10-CM | POA: Diagnosis not present

## 2020-01-16 DIAGNOSIS — M545 Low back pain: Secondary | ICD-10-CM | POA: Diagnosis not present

## 2020-01-28 DIAGNOSIS — R399 Unspecified symptoms and signs involving the genitourinary system: Secondary | ICD-10-CM | POA: Diagnosis not present

## 2020-01-28 DIAGNOSIS — B9689 Other specified bacterial agents as the cause of diseases classified elsewhere: Secondary | ICD-10-CM | POA: Diagnosis not present

## 2020-01-28 DIAGNOSIS — Z87440 Personal history of urinary (tract) infections: Secondary | ICD-10-CM | POA: Diagnosis not present

## 2020-01-28 DIAGNOSIS — N3000 Acute cystitis without hematuria: Secondary | ICD-10-CM | POA: Diagnosis not present

## 2020-02-02 DIAGNOSIS — I776 Arteritis, unspecified: Secondary | ICD-10-CM | POA: Diagnosis not present

## 2020-02-17 DIAGNOSIS — Z23 Encounter for immunization: Secondary | ICD-10-CM | POA: Diagnosis not present

## 2020-02-25 DIAGNOSIS — Z23 Encounter for immunization: Secondary | ICD-10-CM | POA: Diagnosis not present

## 2020-04-09 DIAGNOSIS — Z20822 Contact with and (suspected) exposure to covid-19: Secondary | ICD-10-CM | POA: Diagnosis not present

## 2020-04-12 DIAGNOSIS — Z03818 Encounter for observation for suspected exposure to other biological agents ruled out: Secondary | ICD-10-CM | POA: Diagnosis not present

## 2020-04-12 DIAGNOSIS — R0981 Nasal congestion: Secondary | ICD-10-CM | POA: Diagnosis not present

## 2020-04-12 DIAGNOSIS — R509 Fever, unspecified: Secondary | ICD-10-CM | POA: Diagnosis not present

## 2020-04-12 DIAGNOSIS — R059 Cough, unspecified: Secondary | ICD-10-CM | POA: Diagnosis not present

## 2020-04-22 DIAGNOSIS — R051 Acute cough: Secondary | ICD-10-CM | POA: Diagnosis not present

## 2020-04-22 DIAGNOSIS — F330 Major depressive disorder, recurrent, mild: Secondary | ICD-10-CM | POA: Diagnosis not present

## 2020-04-29 DIAGNOSIS — N3000 Acute cystitis without hematuria: Secondary | ICD-10-CM | POA: Diagnosis not present

## 2020-04-30 DIAGNOSIS — N3000 Acute cystitis without hematuria: Secondary | ICD-10-CM | POA: Diagnosis not present

## 2020-07-16 DIAGNOSIS — M179 Osteoarthritis of knee, unspecified: Secondary | ICD-10-CM | POA: Diagnosis not present

## 2020-07-16 DIAGNOSIS — E559 Vitamin D deficiency, unspecified: Secondary | ICD-10-CM | POA: Diagnosis not present

## 2020-07-16 DIAGNOSIS — R7303 Prediabetes: Secondary | ICD-10-CM | POA: Diagnosis not present

## 2020-07-16 DIAGNOSIS — F330 Major depressive disorder, recurrent, mild: Secondary | ICD-10-CM | POA: Diagnosis not present

## 2020-07-16 DIAGNOSIS — Z79899 Other long term (current) drug therapy: Secondary | ICD-10-CM | POA: Diagnosis not present

## 2020-07-16 DIAGNOSIS — N3281 Overactive bladder: Secondary | ICD-10-CM | POA: Diagnosis not present

## 2020-07-16 DIAGNOSIS — N3000 Acute cystitis without hematuria: Secondary | ICD-10-CM | POA: Diagnosis not present

## 2020-07-16 DIAGNOSIS — R600 Localized edema: Secondary | ICD-10-CM | POA: Diagnosis not present

## 2020-07-16 DIAGNOSIS — G4733 Obstructive sleep apnea (adult) (pediatric): Secondary | ICD-10-CM | POA: Diagnosis not present

## 2020-07-16 DIAGNOSIS — E7800 Pure hypercholesterolemia, unspecified: Secondary | ICD-10-CM | POA: Diagnosis not present

## 2020-07-16 DIAGNOSIS — M5416 Radiculopathy, lumbar region: Secondary | ICD-10-CM | POA: Diagnosis not present

## 2020-07-16 DIAGNOSIS — Z0000 Encounter for general adult medical examination without abnormal findings: Secondary | ICD-10-CM | POA: Diagnosis not present

## 2020-07-17 DIAGNOSIS — M65341 Trigger finger, right ring finger: Secondary | ICD-10-CM | POA: Diagnosis not present

## 2020-09-11 DIAGNOSIS — N3001 Acute cystitis with hematuria: Secondary | ICD-10-CM | POA: Diagnosis not present

## 2020-11-08 ENCOUNTER — Other Ambulatory Visit: Payer: Self-pay | Admitting: Family Medicine

## 2020-11-08 DIAGNOSIS — Z1231 Encounter for screening mammogram for malignant neoplasm of breast: Secondary | ICD-10-CM

## 2020-11-14 ENCOUNTER — Other Ambulatory Visit: Payer: Self-pay | Admitting: Family Medicine

## 2020-11-14 DIAGNOSIS — E2839 Other primary ovarian failure: Secondary | ICD-10-CM

## 2020-11-21 DIAGNOSIS — R3989 Other symptoms and signs involving the genitourinary system: Secondary | ICD-10-CM | POA: Diagnosis not present

## 2020-12-20 DIAGNOSIS — N390 Urinary tract infection, site not specified: Secondary | ICD-10-CM | POA: Diagnosis not present

## 2021-01-06 ENCOUNTER — Ambulatory Visit
Admission: RE | Admit: 2021-01-06 | Discharge: 2021-01-06 | Disposition: A | Discharge status: Home / Self Care | Payer: Medicare Other | Source: Ambulatory Visit | Attending: Family Medicine | Admitting: Family Medicine

## 2021-01-06 ENCOUNTER — Other Ambulatory Visit: Payer: Self-pay

## 2021-01-06 DIAGNOSIS — Z1231 Encounter for screening mammogram for malignant neoplasm of breast: Secondary | ICD-10-CM | POA: Diagnosis not present

## 2021-01-08 ENCOUNTER — Encounter (HOSPITAL_BASED_OUTPATIENT_CLINIC_OR_DEPARTMENT_OTHER): Payer: Self-pay | Admitting: *Deleted

## 2021-01-08 ENCOUNTER — Other Ambulatory Visit: Payer: Self-pay

## 2021-01-08 ENCOUNTER — Emergency Department (HOSPITAL_BASED_OUTPATIENT_CLINIC_OR_DEPARTMENT_OTHER)
Admission: EM | Admit: 2021-01-08 | Discharge: 2021-01-08 | Disposition: A | Discharge status: Home / Self Care | Payer: Medicare Other | Attending: Emergency Medicine | Admitting: Emergency Medicine

## 2021-01-08 ENCOUNTER — Emergency Department (HOSPITAL_BASED_OUTPATIENT_CLINIC_OR_DEPARTMENT_OTHER): Payer: Medicare Other

## 2021-01-08 DIAGNOSIS — Z96653 Presence of artificial knee joint, bilateral: Secondary | ICD-10-CM | POA: Diagnosis not present

## 2021-01-08 DIAGNOSIS — K5730 Diverticulosis of large intestine without perforation or abscess without bleeding: Secondary | ICD-10-CM | POA: Diagnosis not present

## 2021-01-08 DIAGNOSIS — M545 Low back pain, unspecified: Secondary | ICD-10-CM | POA: Diagnosis not present

## 2021-01-08 DIAGNOSIS — K5732 Diverticulitis of large intestine without perforation or abscess without bleeding: Secondary | ICD-10-CM | POA: Diagnosis not present

## 2021-01-08 DIAGNOSIS — R1032 Left lower quadrant pain: Secondary | ICD-10-CM | POA: Diagnosis present

## 2021-01-08 DIAGNOSIS — G8929 Other chronic pain: Secondary | ICD-10-CM | POA: Insufficient documentation

## 2021-01-08 DIAGNOSIS — K449 Diaphragmatic hernia without obstruction or gangrene: Secondary | ICD-10-CM | POA: Diagnosis not present

## 2021-01-08 DIAGNOSIS — K5792 Diverticulitis of intestine, part unspecified, without perforation or abscess without bleeding: Secondary | ICD-10-CM

## 2021-01-08 LAB — CBC WITH DIFFERENTIAL/PLATELET
Abs Immature Granulocytes: 0.02 10*3/uL (ref 0.00–0.07)
Basophils Absolute: 0 10*3/uL (ref 0.0–0.1)
Basophils Relative: 1 %
Eosinophils Absolute: 0.1 10*3/uL (ref 0.0–0.5)
Eosinophils Relative: 1 %
HCT: 42.3 % (ref 36.0–46.0)
Hemoglobin: 14.2 g/dL (ref 12.0–15.0)
Immature Granulocytes: 0 %
Lymphocytes Relative: 32 %
Lymphs Abs: 2.2 10*3/uL (ref 0.7–4.0)
MCH: 28.2 pg (ref 26.0–34.0)
MCHC: 33.6 g/dL (ref 30.0–36.0)
MCV: 84.1 fL (ref 80.0–100.0)
Monocytes Absolute: 0.5 10*3/uL (ref 0.1–1.0)
Monocytes Relative: 7 %
Neutro Abs: 4 10*3/uL (ref 1.7–7.7)
Neutrophils Relative %: 59 %
Platelets: 206 10*3/uL (ref 150–400)
RBC: 5.03 MIL/uL (ref 3.87–5.11)
RDW: 13.5 % (ref 11.5–15.5)
WBC: 6.8 10*3/uL (ref 4.0–10.5)
nRBC: 0 % (ref 0.0–0.2)

## 2021-01-08 LAB — URINALYSIS, ROUTINE W REFLEX MICROSCOPIC
Bilirubin Urine: NEGATIVE
Glucose, UA: NEGATIVE mg/dL
Hgb urine dipstick: NEGATIVE
Ketones, ur: NEGATIVE mg/dL
Nitrite: NEGATIVE
Specific Gravity, Urine: 1.024 (ref 1.005–1.030)
pH: 6 (ref 5.0–8.0)

## 2021-01-08 LAB — COMPREHENSIVE METABOLIC PANEL
ALT: 14 U/L (ref 0–44)
AST: 18 U/L (ref 15–41)
Albumin: 4.3 g/dL (ref 3.5–5.0)
Alkaline Phosphatase: 45 U/L (ref 38–126)
Anion gap: 12 (ref 5–15)
BUN: 17 mg/dL (ref 8–23)
CO2: 22 mmol/L (ref 22–32)
Calcium: 9.5 mg/dL (ref 8.9–10.3)
Chloride: 105 mmol/L (ref 98–111)
Creatinine, Ser: 0.91 mg/dL (ref 0.44–1.00)
GFR, Estimated: >60 mL/min (ref >60)
Glucose, Bld: 107 mg/dL — ABNORMAL HIGH (ref 70–99)
Potassium: 3.9 mmol/L (ref 3.5–5.1)
Sodium: 139 mmol/L (ref 135–145)
Total Bilirubin: 0.8 mg/dL (ref 0.3–1.2)
Total Protein: 7.2 g/dL (ref 6.5–8.1)

## 2021-01-08 LAB — LIPASE, BLOOD: Lipase: 13 U/L (ref 11–51)

## 2021-01-08 MED ORDER — IOHEXOL 350 MG/ML SOLN
75.0000 mL | Freq: Once | INTRAVENOUS | Status: AC | PRN
  Administered 2021-01-08: 75 mL via INTRAVENOUS

## 2021-01-08 MED ORDER — AMOXICILLIN-POT CLAVULANATE 875-125 MG PO TABS: 1.0000 | ORAL_TABLET | Freq: Two times a day (BID) | ORAL | 0 refills | Status: AC

## 2021-01-08 NOTE — ED Provider Notes (Signed)
Emergency Medicine Provider Triage Evaluation Note  Lauren English , a 80 y.o. female  was evaluated in triage.  Pt complains of left lower quad abdominal pain.  She says the pain has been intermittent for 2 weeks.  It got worse the past few days.  She where she has a history of diverticulosis but never diverticulitis or colitis.  She also has recurrent UTIs but this feels different to her.  She did finish a course of 7 days of ciprofloxacin for UTI on 12/27/20.  Denies diarrhea, constipation, nausea or vomiting.  History of appendectomy, partial hysterectomy (ovaries remain), cholecystectomy.  Review of Systems  Positive: Abdominal pain Negative: Fevers, chills, nausea, vomiting, diarrhea  Physical Exam  BP (!) 154/84 (BP Location: Right Arm)   Pulse 88   Temp 98.5 F (36.9 C)   Resp 16   Ht 5\' 9"  (1.753 m)   Wt 104.8 kg   SpO2 (!) 89%   BMI 34.11 kg/m  Gen:   Awake, no distress   Resp:  Normal effort  MSK:   Moves extremities without difficulty  Other:  Left lower quadrant abdominal tenderness.  Medical Decision Making  Medically screening exam initiated at 1:10 PM.  Appropriate orders placed.  SINA SUMPTER was informed that the remainder of the evaluation will be completed by another provider, this initial triage assessment does not replace that evaluation, and the importance of remaining in the ED until their evaluation is complete.  LLQ abdominal pain evaluation   [name], MD 01/08/21 1311

## 2021-01-08 NOTE — ED Provider Notes (Signed)
[facility] EMERGENCY DEPT Provider Note   CSN: [id number] Arrival date & time: 01/08/21  1239     History Chief Complaint  Patient presents with   Abdominal Pain    Lauren English is a 80 y.o. female presenting with intermittent left lower quadrant abdominal pain for 2 weeks.  It worsened the past few days.  She denies nausea, vomiting, constipation, diarrhea.  The pain is worse with palpation and ambulation.  She does suffer from chronic low back pain but feels that this is different.  She reports a history of diverticulosis but never diverticulitis or colitis.  She also reports a history of chronic recurring UTIs, and completed a course of ciprofloxacin 7 days on 12/27/20.  She denies active dysuria or hematuria.  She has a history of partial hysterectomy, appendectomy, and cholecystectomy.  HPI     Past Medical History:  Diagnosis Date   Anemia    Arthritis    "qwhere; feet, knees, neck" (04/21/2012)   Bladder incontinence    Chronic back pain    Closed angle glaucoma    "both eyes" (04/21/2012)   Difficult intubation    with intubation on November 26, 2014   Numbness    fingertips bilat and feet bilat    OSA on CPAP    Overactive bladder    Peripheral neuropathy    feet bilat    Pneumonia    hx. 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   Abscess of knee, left 01/25/2017   Arthrofibrosis of total knee arthroplasty ([facility]) 03/25/2015   Quadriceps tendon rupture 12/09/2014   OA (osteoarthritis) of knee 11/26/2014    Past Surgical History:  Procedure Laterality Date   ABDOMINAL HYSTERECTOMY  1980   "partial" (04/21/2012)   ANTERIOR FUSION CERVICAL SPINE  12/2005   APPENDECTOMY     BACK SURGERY     BLADDER SUSPENSION  1990   BREAST EXCISIONAL BIOPSY Bilateral    Benign    CHOLECYSTECTOMY N/A 10/17/2019   Procedure: LAPAROSCOPIC CHOLECYSTECTOMY;  Surgeon: Almond Lint, MD;  Location: [facility] OR;  Service: General;  Laterality: N/A;   DILATION AND CURETTAGE OF UTERUS  1980   INTRAOPERATIVE CHOLANGIOGRAM N/A 10/17/2019   Procedure: POSSIBLE INTRAOPERATIVE CHOLANGIOGRAM;  Surgeon: Almond Lint, MD;  Location: [facility] OR;  Service: General;  Laterality: N/A;   IRRIGATION AND DEBRIDEMENT KNEE Left 01/25/2017   Procedure: IRRIGATION AND DEBRIDEMENT KNEE;  Surgeon: Ollen Gross, MD;  Location: [facility] ORS;  Service: Orthopedics;  Laterality: Left;   KNEE ARTHROPLASTY  06/2008   "slipped on ice; torn ligaments & cartilage" (04/21/2012)   KNEE CLOSED REDUCTION Left 03/25/2015   Procedure: CLOSED MANIPULATION LEFT KNEE;  Surgeon: Ollen Gross, MD;  Location: [facility];  Service: Orthopedics;  Laterality: Left;   KNEE CLOSED REDUCTION Left 11/18/2015   Procedure: LEFT KNEE CLOSED MANIPULATION;  Surgeon: Ollen Gross, MD;  Location: [facility];  Service: Orthopedics;  Laterality: Left;   PATELLAR TENDON REPAIR Left 12/10/2014   Procedure: REPAIR PARTIAL QUAD TENDON TEAR;  Surgeon: Ollen Gross, MD;  Location: [facility];  Service: Orthopedics;  Laterality: Left;   POSTERIOR LUMBAR FUSION  04/21/2012   TONSILLECTOMY AND ADENOIDECTOMY  ~ 1950   TOTAL KNEE ARTHROPLASTY Left 11/26/2014   Procedure: TOTAL LEFT   KNEE ARTHROPLASTY;  Surgeon: Ollen Gross, MD;  Location: [facility];  Service: Orthopedics;  Laterality: Left;   TOTAL KNEE ARTHROPLASTY Right 11/18/2015   Procedure: RIGHT TOTAL KNEE ARTHROPLASTY;  Surgeon: Ollen Gross, MD;  Location: [facility];  Service: Orthopedics;  Laterality: Right;   TUBAL LIGATION  1970's     OB History     Gravida  3   Para  3   Term      Preterm      AB      Living         SAB      IAB      Ectopic      Multiple      Live Births              Family History  Problem Relation Age of Onset   Breast cancer Maternal Aunt     Social History    Tobacco Use   Smoking status: Never   Smokeless tobacco: Never  Vaping Use   Vaping Use: Never used  Substance Use Topics   Alcohol use: Yes    Alcohol/week: 1.0 standard drink    Types: 1 Glasses of wine per week    Comment: once per week   Drug use: No    Home Medications Prior to Admission medications   Medication Sig Start Date End Date Taking? Authorizing Provider  Acetylcarnitine HCl (ACETYL L-CARNITINE) 500 MG CAPS Take 500 mg by mouth daily.   Yes [provider]  amoxicillin-clavulanate (AUGMENTIN) 875-125 MG tablet Take 1 tablet by mouth every 12 (twelve) hours for 7 days. 01/08/21 01/15/21 Yes Moncerrat Burnstein, Kermit Balo, MD  b complex vitamins tablet Take 1 tablet by mouth daily.   Yes [provider]  calcium carbonate (OSCAL) 1500 (600 Ca) MG TABS tablet Take 750 mg by mouth 2 (two) times daily with a meal.   Yes [provider]  Cholecalciferol (VITAMIN D3) 2000 units TABS Take 2,000 mg by mouth daily.   Yes [provider]  docusate sodium (COLACE) 100 MG capsule Take 100 mg by mouth 2 (two) times daily.   Yes [provider]  Omega-3 Fatty Acids (OMEGA-3 PO) Take 1 capsule by mouth 2 (two) times daily.    Yes [provider]  polyvinyl alcohol-povidone (HYPOTEARS) 1.4-0.6 % ophthalmic solution Place 1 drop into both eyes daily.   Yes [provider]  TURMERIC CURCUMIN PO Take 1 capsule by mouth in the morning and at bedtime.    Yes [provider]  ALPRAZolam (XANAX) 0.5 MG tablet Take 0.25-0.5 mg by mouth at bedtime as needed for sleep. 07/12/19   [provider]    Allergies    Oxycodone  Review of Systems   Review of Systems  Constitutional:  Negative for chills and fever.  Respiratory:  Negative for cough and shortness of breath.   Cardiovascular:  Negative for chest pain and palpitations.  Gastrointestinal:  Positive for abdominal pain. Negative for constipation, diarrhea, nausea and  vomiting.  Genitourinary:  Negative for dysuria and hematuria.  Musculoskeletal:  Positive for back pain. Negative for arthralgias.  Skin:  Negative for color change and rash.  Neurological:  Negative for syncope and headaches.  All other systems reviewed and are negative.  Physical Exam Updated Vital Signs BP 139/71 (BP Location: Right Arm)   Pulse 87   Temp 98.5 F (36.9 C)   Resp 16   Ht 5\' 9"  (1.753 m)   Wt 104.8 kg   SpO2 96%   BMI  34.11 kg/m   Physical Exam Constitutional:      General: She is not in acute distress. HENT:     Head: Normocephalic and atraumatic.  Eyes:     Conjunctiva/sclera: Conjunctivae normal.     Pupils: Pupils are equal, round, and reactive to light.  Cardiovascular:     Rate and Rhythm: Normal rate and regular rhythm.  Pulmonary:     Effort: Pulmonary effort is normal. No respiratory distress.  Abdominal:     General: There is no distension.     Tenderness: There is abdominal tenderness in the left lower quadrant. There is no guarding or rebound.  Skin:    General: Skin is warm and dry.  Neurological:     General: No focal deficit present.     Mental Status: She is alert. Mental status is at baseline.  Psychiatric:        Mood and Affect: Mood normal.        Behavior: Behavior normal.    ED Results / Procedures / Treatments   Labs (all labs ordered are listed, but only abnormal results are displayed) Labs Reviewed  COMPREHENSIVE METABOLIC PANEL - Abnormal; Notable for the following components:      Result Value   Glucose, Bld 107 (*)    All other components within normal limits  URINALYSIS, ROUTINE W REFLEX MICROSCOPIC - Abnormal; Notable for the following components:   APPearance HAZY (*)    Protein, ur TRACE (*)    Leukocytes,Ua LARGE (*)    Bacteria, UA RARE (*)    Non Squamous Epithelial 0-5 (*)    All other components within normal limits  URINE CULTURE  LIPASE, BLOOD  CBC WITH DIFFERENTIAL/PLATELET  CBC     EKG None  Radiology CT ABDOMEN PELVIS W CONTRAST  Result Date: 01/08/2021 CLINICAL DATA:  Diverticulitis suspected LLQ abdominal pain EXAM: CT ABDOMEN AND PELVIS WITH CONTRAST TECHNIQUE: Multidetector CT imaging of the abdomen and pelvis was performed using the standard protocol following bolus administration of intravenous contrast. CONTRAST:  59mL OMNIPAQUE IOHEXOL 350 MG/ML SOLN COMPARISON:  Abdomen MRI 08/19/2019 FINDINGS: Lower chest: Bibasilar scarring versus linear atelectasis. Hepatobiliary: There is a tiny central liver hypodensity, likely a cyst. Prior cholecystectomy. Pancreas: Unremarkable. No pancreatic ductal dilatation or surrounding inflammatory changes. Spleen: Normal in size without focal abnormality. Adrenals/Urinary Tract: Adrenal glands are unremarkable. No hydronephrosis. There are tiny bilateral renal cysts. Bladder is unremarkable. Stomach/Bowel: Small hiatal hernia. The stomach is otherwise within normal limits. There is no evidence of bowel obstruction. Prior appendectomy. There is diverticulosis of the distal descending/proximal sigmoid colon, with adjacent significant inflammatory stranding. Vascular/Lymphatic: Aortoiliac atherosclerotic calcifications. No AAA. No lymphadenopathy. Reproductive: Prior hysterectomy. Other: No abdominal wall hernia or abnormality. No abdominopelvic ascites. No drainable fluid collection. No free intraperitoneal gas. Musculoskeletal: Trace retrolisthesis at L1-L2. Posterior and interbody fusion at L4-L5. Multilevel facet arthropathy. Multilevel degenerative disc disease. No acute osseous abnormality. No suspicious lytic or blastic lesions. Mild-to-moderate bilateral hip arthritis, right greater than left. IMPRESSION: Acute uncomplicated distal descending/proximal sigmoid colon diverticulitis. No drainable fluid collection. If not recently performed, recommend colonoscopy after treatment. Electronically Signed   By: Caprice Renshaw M.D.   On:  01/08/2021 15:15    Procedures Procedures   Medications Ordered in ED Medications  iohexol (OMNIPAQUE) 350 MG/ML injection 75 mL (75 mLs Intravenous Contrast Given 01/08/21 1427)    ED Course  I have reviewed the triage vital signs and the nursing notes.  Pertinent labs & imaging results that  were available during my care of the patient were reviewed by me and considered in my medical decision making (see chart for details).  This patient presents to the Emergency Department with complaint of abdominal pain. This involves an extensive number of treatment options, and is a complaint that carries with it a high risk of complications and morbidity.  The differential diagnosis includes, but is not limited to, gastritis vs biliary disease vs peptic ulcer vs constipation vs colitis vs UTI vs other  I ordered, reviewed, and interpreted labs, including BMP and CBC.  There were no immediate, life-threatening emergencies found in this labwork.   UA no nitrites - doubt acute infection I ordered imaging studies which included ct abdomen pelvis I independently visualized and interpreted imaging which showed uncomplicated diverticulitis   After the interventions stated above, I reevaluated the patient and found that they remained clinically stable.  Based on the patient's clinical exam, vital signs, risk factors, and ED testing, I felt that the patient's overall risk of life-threatening emergency such as bowel perforation, surgical emergency, or sepsis was quite low.  I suspect this clinical presentation is most consistent with diverticulitis, but explained to the patient that this evaluation was not a definitive diagnostic workup.  I discussed outpatient follow up with primary care provider, and provided specialist office number on the patient's discharge paper if a referral was deemed necessary.  I discussed return precautions with the patient. I felt the patient was clinically stable for  discharge.   Clinical Course as of 01/08/21 1645  Wed Jan 08, 2021  1531 CT scan consistent with sigmoid diverticulitis.  Uncomplicated.  However because she has symptoms for 2 weeks, I think it is reasonable to treat her with antibiotics.  No evidence of perforation or sepsis.  She otherwise well-appearing.  I suspect this initial hypoxia of 89% was an erroneous measurement as she has not hypoxic in the room.  I will start her on Augmentin for 7 days, and would prefer to avoid more fluoroquinolones given that she completed a course for UTI earlier this month. [MT]    Clinical Course User Index [MT] Tildon Silveria, Kermit BaloMatthew J, MD    Final Clinical Impression(s) / ED Diagnoses Final diagnoses:  Diverticulitis    Rx / DC Orders ED Discharge Orders          Ordered    amoxicillin-clavulanate (AUGMENTIN) 875-125 MG tablet  Every 12 hours        01/08/21 1531             Terald Sleeperrifan, Norine Reddington J, MD 01/08/21 1645

## 2021-01-08 NOTE — ED Triage Notes (Signed)
Mild LLQ pain since Saturday and it went away.  Last night the pain is really sever.  T=99.6.  No nausea or vomiting.

## 2021-01-11 LAB — URINE CULTURE: Culture: >=100000 — AB

## 2021-01-12 ENCOUNTER — Telehealth: Payer: Self-pay | Admitting: Emergency Medicine

## 2021-01-12 NOTE — Telephone Encounter (Signed)
Post ED Visit - Positive Culture Follow-up  Culture report reviewed by antimicrobial stewardship pharmacist: [facility] []  , Pharm.D. []  Enzo Bi, Pharm.D., BCPS AQ-ID []  , Pharm.D., BCPS []  Celedonio Miyamoto, Pharm.D., BCPS []  [place], Garvin Fila.D., BCPS, AAHIVP []  , Pharm.D., BCPS, AAHIVP []  Georgina Pillion, PharmD, BCPS []  , PharmD, BCPS []  [place], PharmD, BCPS [x]  [place], PharmD []  , PharmD, BCPS []  Estella Husk, PharmD  Pharmacy Team []  Lysle Pearl, PharmD []  , PharmD []  Phillips Climes, PharmD []  , Rph []  Agapito Games) , PharmD []  Loleta Dicker, PharmD []  , PharmD []  Mervyn Gay, PharmD []  , PharmD []  Vinnie Level, PharmD []  Wonda Olds, PharmD []  , PharmD []  Len Childs, PharmD   Positive urine culture Treated with Amoxicillin-Pot Clavulanate, organism sensitive to the same and no further patient follow-up is required at this time.  Monserat Prestigiacomo 01/12/2021, 10:48 AM

## 2021-01-14 ENCOUNTER — Other Ambulatory Visit: Payer: Self-pay | Admitting: Neurosurgery

## 2021-01-14 DIAGNOSIS — M545 Low back pain, unspecified: Secondary | ICD-10-CM | POA: Diagnosis not present

## 2021-01-14 DIAGNOSIS — G8929 Other chronic pain: Secondary | ICD-10-CM

## 2021-01-14 DIAGNOSIS — M542 Cervicalgia: Secondary | ICD-10-CM | POA: Diagnosis not present

## 2021-01-17 ENCOUNTER — Ambulatory Visit
Admission: RE | Admit: 2021-01-17 | Discharge: 2021-01-17 | Disposition: A | Discharge status: Home / Self Care | Payer: Medicare Other | Source: Ambulatory Visit | Attending: Neurosurgery | Admitting: Neurosurgery

## 2021-01-17 ENCOUNTER — Other Ambulatory Visit: Payer: Self-pay

## 2021-01-17 DIAGNOSIS — G8929 Other chronic pain: Secondary | ICD-10-CM

## 2021-01-17 DIAGNOSIS — M48061 Spinal stenosis, lumbar region without neurogenic claudication: Secondary | ICD-10-CM | POA: Diagnosis not present

## 2021-01-17 DIAGNOSIS — M545 Low back pain, unspecified: Secondary | ICD-10-CM

## 2021-01-17 DIAGNOSIS — M5136 Other intervertebral disc degeneration, lumbar region: Secondary | ICD-10-CM | POA: Diagnosis not present

## 2021-01-17 MED ORDER — ONDANSETRON HCL 4 MG/2ML IJ SOLN: 4.0000 mg | Freq: Once | INTRAMUSCULAR | Status: DC | PRN

## 2021-01-17 MED ORDER — IOPAMIDOL (ISOVUE-M 200) INJECTION 41%
15.0000 mL | Freq: Once | INTRAMUSCULAR | Status: AC
  Administered 2021-01-17: 15 mL via INTRATHECAL

## 2021-01-17 MED ORDER — MEPERIDINE HCL 50 MG/ML IJ SOLN
50.0000 mg | Freq: Once | INTRAMUSCULAR | Status: DC | PRN
Start: 2021-01-17 — End: 2021-01-18

## 2021-01-17 MED ORDER — DIAZEPAM 5 MG PO TABS
5.0000 mg | ORAL_TABLET | Freq: Once | ORAL | Status: AC
  Administered 2021-01-17: 5 mg via ORAL

## 2021-01-17 NOTE — Discharge Instructions (Signed)

## 2021-01-20 DIAGNOSIS — H40013 Open angle with borderline findings, low risk, bilateral: Secondary | ICD-10-CM | POA: Diagnosis not present

## 2021-01-20 DIAGNOSIS — H43813 Vitreous degeneration, bilateral: Secondary | ICD-10-CM | POA: Diagnosis not present

## 2021-01-20 DIAGNOSIS — H35363 Drusen (degenerative) of macula, bilateral: Secondary | ICD-10-CM | POA: Diagnosis not present

## 2021-01-20 DIAGNOSIS — H26493 Other secondary cataract, bilateral: Secondary | ICD-10-CM | POA: Diagnosis not present

## 2021-01-26 IMAGING — MR MR ABDOMEN WO/W CM MRCP
18 of 19 series · 43 of 48 positions shown · IV contrast (20 ml multihance)
Comparison: Ultrasound 08/14/2019.

CLINICAL DATA: Abdominal pain. Echogenic material and
hypervascularity in the gallbladder on recent ultrasound. Normal
LFTs 08/14/2019.

EXAM:
MRI ABDOMEN WITHOUT AND WITH CONTRAST (INCLUDING MRCP)
TECHNIQUE: Multiplanar multisequence MR imaging of the abdomen was performed
both before and after the administration of intravenous contrast.
Heavily T2-weighted images of the biliary and pancreatic ducts were
obtained, and three-dimensional MRCP images were rendered by post
processing.
CONTRAST:  20mL MULTIHANCE GADOBENATE DIMEGLUMINE 529 MG/ML IV SOLN

[Series 3: T2 · coronal · 5.0mm · 1.56mm/px · 2 of 36 slices shown (1 of 4)]
[im 1/36]
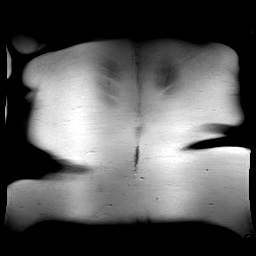
[im 36/36]
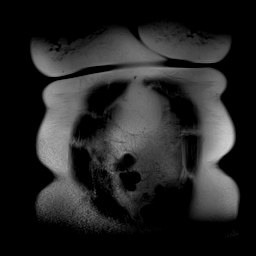

[Series 4: T1 · axial · 3.0mm · 1.19mm/px · z∈[-73,+140]mm · 6 of 144 slices shown]
[im 1/144]
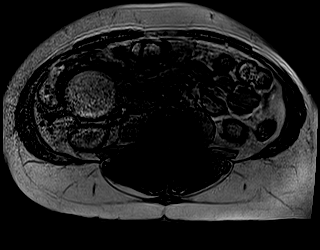
[im 29/144]
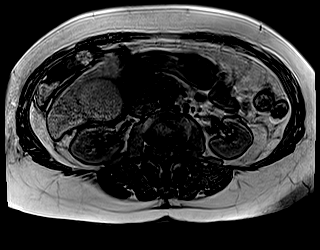
[im 58/144]
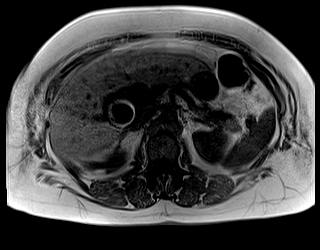
[im 86/144]
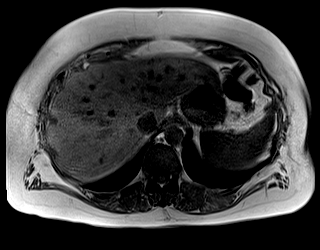
[im 115/144]
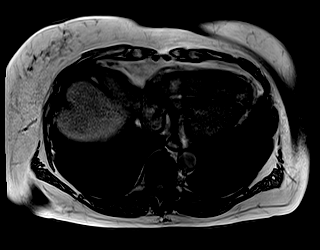
[im 144/144]
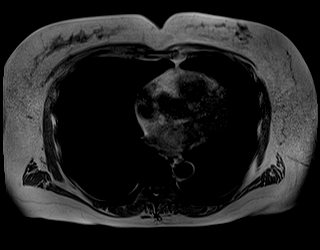

[Series 5: T2 · axial · 5.0mm · 1.48mm/px · 1 of 41 slices shown (2 of 4)]
[im 1/41]
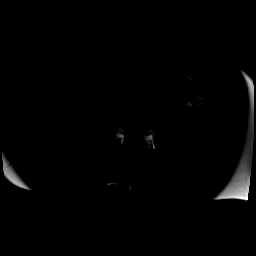

[Series 6: DWI · axial · 5.0mm · 1.42mm/px · z∈[-92,+142]mm · 4 of 120 slices shown (1 of 2)]
[im 1/120]
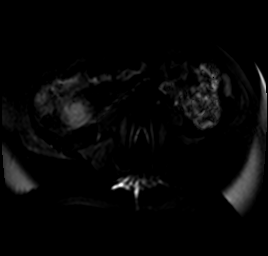
[im 40/120]
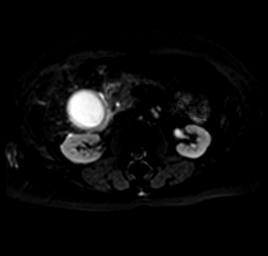
[im 80/120]
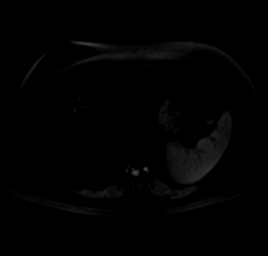
[im 120/120]
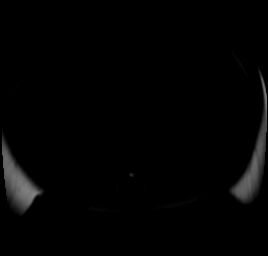

[Series 7: DWI · axial · 5.0mm · 1.42mm/px · 1 of 40 slices shown (2 of 2)]
[im 1/40]
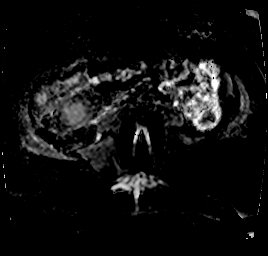

[Series 10: MRCP · coronal · 1.0mm · 0.49mm/px · 2 of 64 slices shown]
[im 1/64]
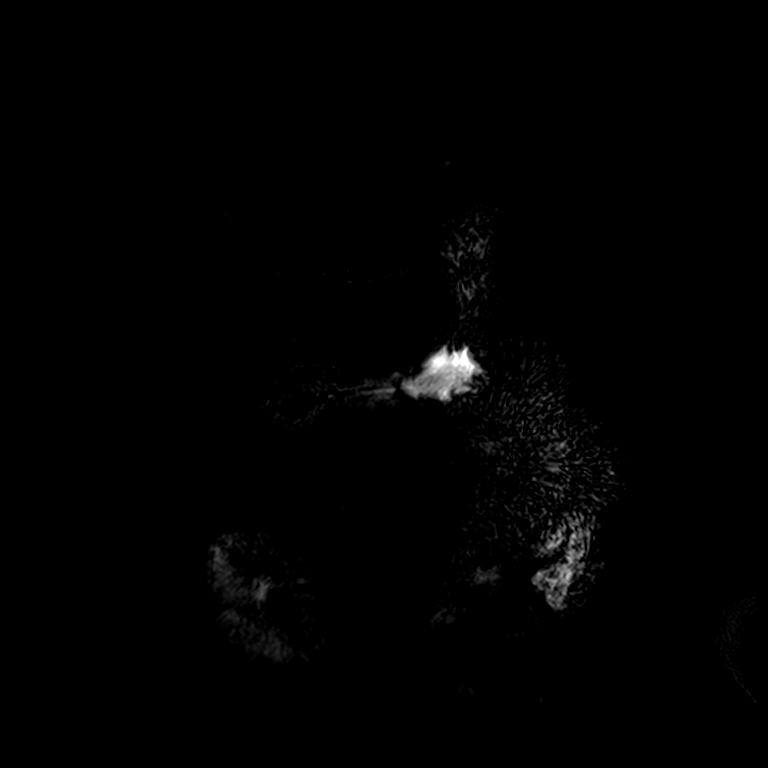
[im 64/64]
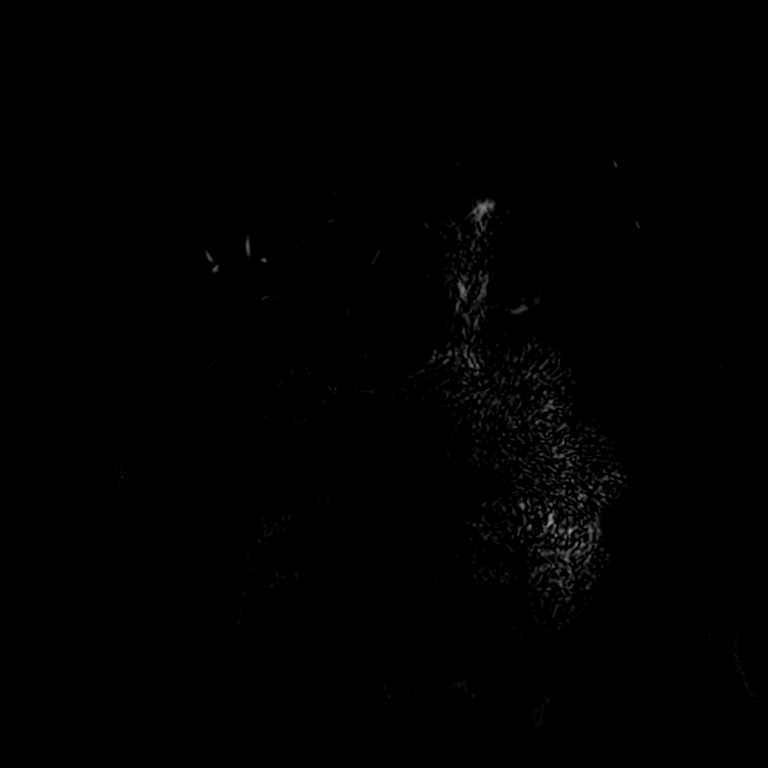

[Series 12: T2 · axial · 6.0mm · 1.22mm/px · 1 of 35 slices shown (3 of 4)]
[im 1/35]
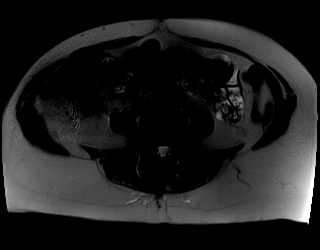

[Series 13: bSSFP · axial · 5.0mm · 1.25mm/px · 1 of 42 slices shown]
[im 1/42]
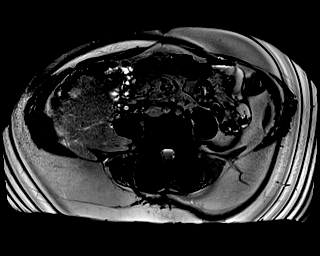

[Series 15: T2 · coronal · 3.0mm · 1.19mm/px · 1 of 21 slices shown (4 of 4)]
[im 1/21]
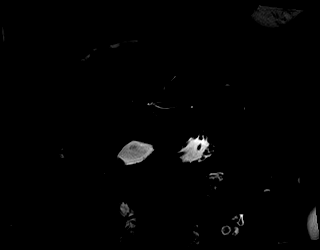

[Series 16: T1 dynamic · axial · non-contrast · 3.0mm · 1.25mm/px · z∈[-120,+141]mm · 3 of 88 slices shown]
[im 1/88]
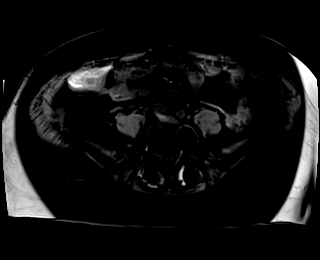
[im 44/88]
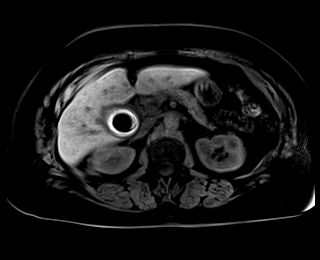
[im 88/88]
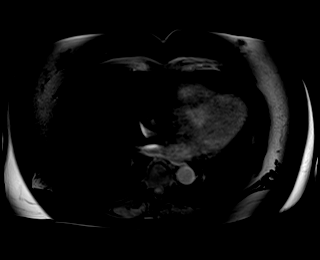

[Series 17: T1 dynamic post-contrast · axial · 3.0mm · 1.25mm/px · z∈[-120,+141]mm · 3 of 88 slices shown (1 of 8)]
[im 1/88]
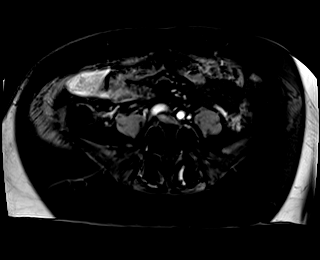
[im 44/88]
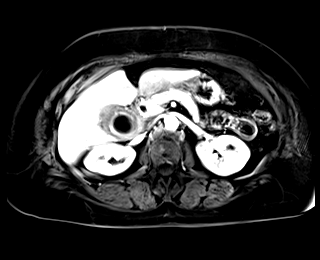
[im 88/88]
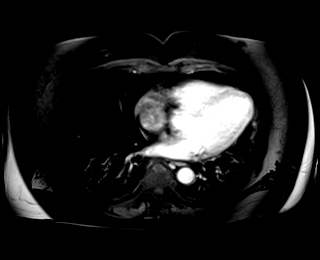

[Series 18: T1 dynamic post-contrast · axial · 3.0mm · 1.25mm/px · z∈[-120,+141]mm · 3 of 88 slices shown (2 of 8)]
[im 1/88]
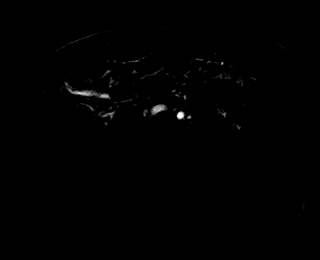
[im 44/88]
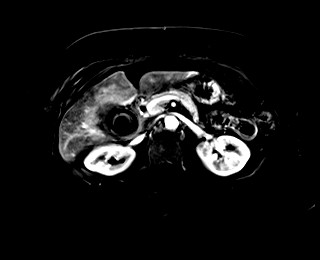
[im 88/88]
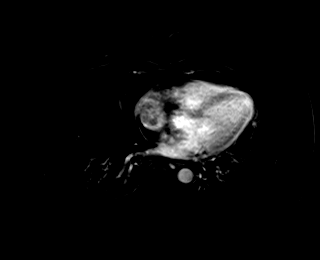

[Series 19: T1 dynamic post-contrast · axial · 3.0mm · 1.25mm/px · z∈[-120,+141]mm · 3 of 88 slices shown (3 of 8)]
[im 1/88]
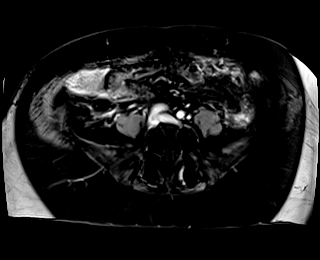
[im 44/88]
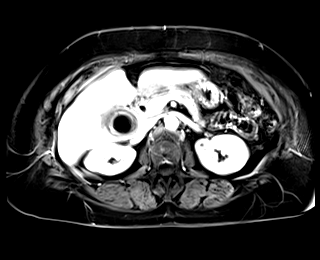
[im 88/88]
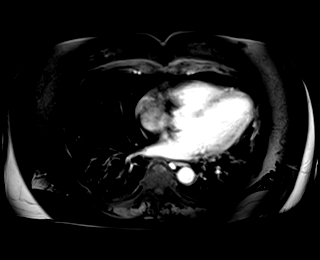

[Series 20: T1 dynamic post-contrast · axial · 3.0mm · 1.25mm/px · z∈[-120,+141]mm · 3 of 88 slices shown (4 of 8)]
[im 1/88]
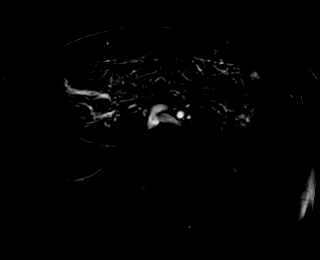
[im 44/88]
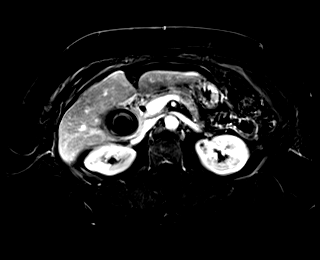
[im 88/88]
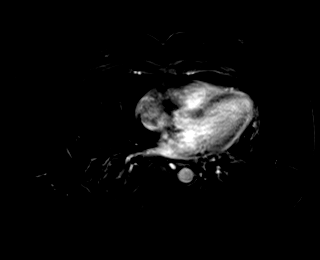

[Series 21: T1 dynamic post-contrast · axial · 3.0mm · 1.25mm/px · z∈[-120,+141]mm · 3 of 88 slices shown (5 of 8)]
[im 1/88]
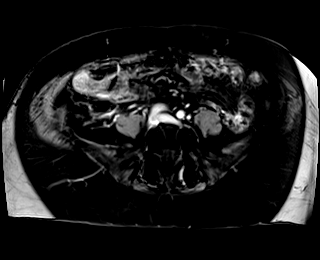
[im 44/88]
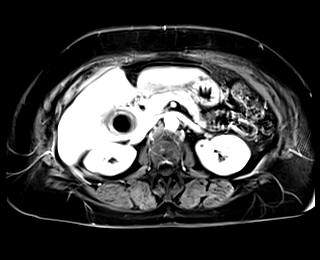
[im 88/88]
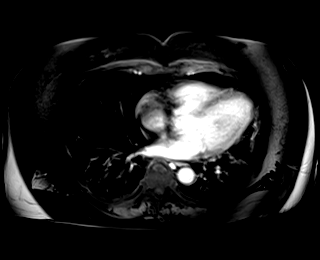

[Series 22: T1 dynamic post-contrast · axial · 3.0mm · 1.25mm/px · z∈[-120,+141]mm · 3 of 88 slices shown (6 of 8)]
[im 1/88]
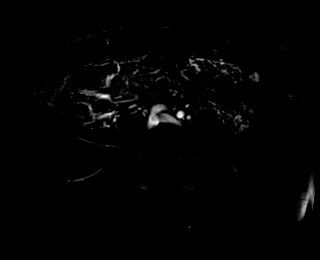
[im 44/88]
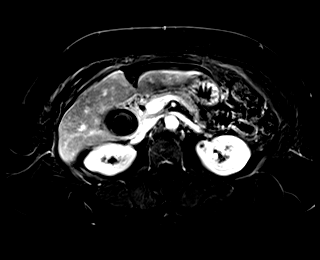
[im 88/88]
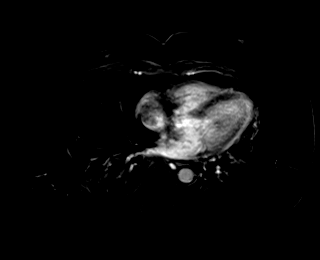

[Series 23: T1 dynamic post-contrast · coronal · 3.0mm · 1.25mm/px · 2 of 72 slices shown (7 of 8)]
[im 1/72]
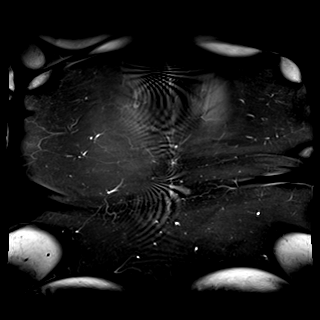
[im 72/72]
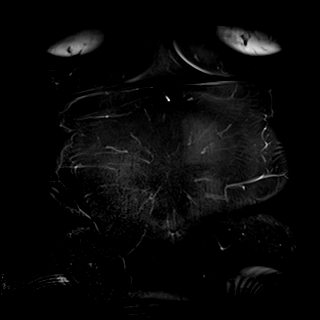

[Series 24: T1 dynamic post-contrast · axial · 3.0mm · 1.25mm/px · 1 of 88 slices shown (8 of 8)]
[im 1/88]
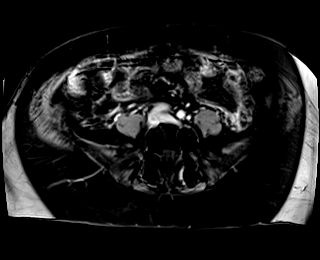

[43 of 48 positions shown; findings below may reference images not displayed]

FINDINGS: Lower chest:  The visualized lower chest appears unremarkable.

Hepatobiliary: There is a large (3.8 cm) gallstone which appears
impacted within the gallbladder neck. The gallbladder is distended
with wall thickening and mild surrounding inflammation. There is
mild extrinsic mass effect on the common hepatic duct. The common
bile duct is normal in caliber. No evidence of choledocholithiasis.
Minimal intrahepatic biliary dilatation. There is a small cyst
centrally in the left hepatic lobe. No evidence of enhancing hepatic
or gallbladder mass.

Pancreas: Unremarkable. No pancreatic ductal dilatation or
surrounding inflammatory changes.

Spleen: Normal in size without focal abnormality.

Adrenals/Urinary Tract: Both adrenal glands appear normal. Probable
small bilateral renal cysts. No evidence of renal mass or
hydronephrosis.

Stomach/Bowel: No evidence of bowel wall thickening, distention or
surrounding inflammatory change.

Vascular/Lymphatic: There are no enlarged abdominal lymph nodes. No
significant vascular findings. The IVC, portal, superior mesenteric,
splenic and hepatic veins are patent.

Other: No ascites.

Musculoskeletal: Postsurgical changes from previous lower lumbar
fusion.
IMPRESSION: 1. Distended gallbladder with wall thickening and surrounding
inflammation consistent with acute cholecystitis from large impacted
gallstone in the gallbladder neck.
2. Minimal intrahepatic biliary dilatation with mild extrinsic mass
effect on the common hepatic duct. No evidence of
choledocholithiasis.
3. These results will be called to the ordering clinician or
representative by the Radiologist Assistant, and communication
documented in the PACS or [REDACTED].

## 2021-02-03 DIAGNOSIS — M48062 Spinal stenosis, lumbar region with neurogenic claudication: Secondary | ICD-10-CM | POA: Diagnosis not present

## 2021-02-03 DIAGNOSIS — M5136 Other intervertebral disc degeneration, lumbar region: Secondary | ICD-10-CM | POA: Diagnosis not present

## 2021-02-10 ENCOUNTER — Encounter (HOSPITAL_COMMUNITY): Payer: Self-pay | Admitting: Radiology

## 2021-03-01 DIAGNOSIS — Z20822 Contact with and (suspected) exposure to covid-19: Secondary | ICD-10-CM | POA: Diagnosis not present

## 2021-03-03 DIAGNOSIS — N3001 Acute cystitis with hematuria: Secondary | ICD-10-CM | POA: Diagnosis not present

## 2021-04-23 DIAGNOSIS — N3000 Acute cystitis without hematuria: Secondary | ICD-10-CM | POA: Diagnosis not present

## 2021-04-23 DIAGNOSIS — Z23 Encounter for immunization: Secondary | ICD-10-CM | POA: Diagnosis not present

## 2021-04-23 DIAGNOSIS — F330 Major depressive disorder, recurrent, mild: Secondary | ICD-10-CM | POA: Diagnosis not present

## 2021-04-23 DIAGNOSIS — N390 Urinary tract infection, site not specified: Secondary | ICD-10-CM | POA: Diagnosis not present

## 2021-05-14 ENCOUNTER — Ambulatory Visit
Admission: RE | Admit: 2021-05-14 | Discharge: 2021-05-14 | Disposition: A | Discharge status: Home / Self Care | Payer: Medicare Other | Source: Ambulatory Visit | Attending: Family Medicine | Admitting: Family Medicine

## 2021-05-14 DIAGNOSIS — E2839 Other primary ovarian failure: Secondary | ICD-10-CM

## 2021-05-14 DIAGNOSIS — M85832 Other specified disorders of bone density and structure, left forearm: Secondary | ICD-10-CM | POA: Diagnosis not present

## 2021-05-14 DIAGNOSIS — Z780 Asymptomatic menopausal state: Secondary | ICD-10-CM | POA: Diagnosis not present

## 2021-05-19 DIAGNOSIS — N390 Urinary tract infection, site not specified: Secondary | ICD-10-CM | POA: Diagnosis not present

## 2021-06-07 DIAGNOSIS — N3000 Acute cystitis without hematuria: Secondary | ICD-10-CM | POA: Diagnosis not present

## 2021-06-24 DIAGNOSIS — M48062 Spinal stenosis, lumbar region with neurogenic claudication: Secondary | ICD-10-CM | POA: Diagnosis not present

## 2021-06-24 DIAGNOSIS — Z6833 Body mass index (BMI) 33.0-33.9, adult: Secondary | ICD-10-CM | POA: Diagnosis not present

## 2021-06-26 DIAGNOSIS — N952 Postmenopausal atrophic vaginitis: Secondary | ICD-10-CM | POA: Diagnosis not present

## 2021-06-26 DIAGNOSIS — N390 Urinary tract infection, site not specified: Secondary | ICD-10-CM | POA: Diagnosis not present

## 2021-06-26 DIAGNOSIS — K5900 Constipation, unspecified: Secondary | ICD-10-CM | POA: Diagnosis not present

## 2021-07-01 ENCOUNTER — Other Ambulatory Visit: Payer: Self-pay | Admitting: Neurosurgery

## 2021-07-02 ENCOUNTER — Other Ambulatory Visit: Payer: Self-pay | Admitting: Neurosurgery

## 2021-07-11 NOTE — Pre-Procedure Instructions (Signed)
? ? Lauren English ? 07/11/2021  ?  ?  ? Surgical Instructions ? ? Your procedure is scheduled on Thurs., July 17, 2021 from 1:22PM-3:53PM. ? Report to [facility] Main Entrance "A" at 11:20 A.M., then check in with the Admitting office. ? Call this number if you have problems the morning of surgery: ? [phone] ? ? Remember: ? Do not eat or drink after midnight on March 8th ?  ? Take these medicines the morning of surgery with A SIP OF WATER: ?Simvastatin (ZOCOR) ? ?If Needed: ?Acetaminophen (TYLENOL)  ? ?As of today, STOP taking any Aspirin (unless otherwise instructed by your surgeon) Aleve, Naproxen, Ibuprofen, Motrin, Advil, Goody's, BC's, all herbal medications, fish oil, and all vitamins. ? ?           Day of Surgery: ?Do not wear jewelry or makeup ?Do not wear lotions, powders, perfumes/colognes, or deodorant. ?Do not shave 48 hours prior to surgery.   ?Do not bring valuables to the hospital. ?DO Not wear nail polish, gel polish, artificial nails, or any other type of covering on natural nails (fingers and toes) ?If you have artificial nails or gel coating that need to be removed by a nail salon, please have this removed prior to surgery. Artificial nails or gel coating may interfere with anesthesia's ability to adequately monitor your vital signs. ? ?           [facility] is not responsible for any belongings or valuables. ? ?Do NOT Smoke (Tobacco/Vaping)  24 hours prior to your procedure ? ?If you use a CPAP at night, you may bring your mask and machine for your overnight stay. ?  ?Contacts, glasses, hearing aids, dentures or partials may not be worn into surgery, please bring cases for these belongings ?  ?For patients admitted to the hospital, discharge time will be determined by your treatment team. ?  ?Patients discharged the day of surgery will not be allowed to drive home, and someone needs to stay with them for 24 hours. ? ?NO VISITORS WILL BE ALLOWED IN PRE-OP WHERE PATIENTS ARE PREPPED FOR  SURGERY.  ONLY 1 SUPPORT PERSON MAY BE PRESENT IN THE WAITING ROOM WHILE YOU ARE IN SURGERY.  IF YOU ARE TO BE ADMITTED, ONCE YOU ARE IN YOUR ROOM YOU WILL BE ALLOWED TWO (2) VISITORS. 1 (ONE) VISITOR MAY STAY OVERNIGHT BUT MUST ARRIVE TO THE ROOM BY 8pm.  Minor children may have two parents present. Special consideration for safety and communication needs will be reviewed on a case by case basis. ? ?Special instructions:   ? ?Oral Hygiene is also important to reduce your risk of infection.  Remember - BRUSH YOUR TEETH THE MORNING OF SURGERY WITH YOUR REGULAR TOOTHPASTE ? ?[facility]- Preparing For Surgery ? ?Before surgery, you can play an important role. Because skin is not sterile, your skin needs to be as free of germs as possible. You can reduce the number of germs on your skin by washing with CHG (chlorahexidine gluconate) Soap before surgery.  CHG is an antiseptic cleaner which kills germs and bonds with the skin to continue killing germs even after washing.   ? ?Please do not use if you have an allergy to CHG or antibacterial soaps. If your skin becomes reddened/irritated stop using the CHG.  ?Do not shave (including legs and underarms) for at least 48 hours prior to first CHG shower. It is OK to shave your face. ? ?Please follow these instructions carefully. ?  ?  Shower the NIGHT BEFORE SURGERY and the MORNING OF SURGERY with CHG Soap.  ? If you chose to wash your hair, wash your hair first as usual with your normal shampoo. After you shampoo, rinse your hair and body thoroughly to remove the shampoo.  Then [employer] and genitals (private parts) with your normal soap and rinse thoroughly to remove soap. ? ?After that Use CHG Soap as you would any other liquid soap. You can apply CHG directly to the skin and wash gently with a scrungie or a clean washcloth.  ? ?Apply the CHG Soap to your body ONLY FROM THE NECK DOWN.  Do not use on open wounds or open sores. Avoid contact with your eyes, ears, mouth and  genitals (private parts). Wash Face and genitals (private parts)  with your normal soap.  ? ?Wash thoroughly, paying special attention to the area where your surgery will be performed. ? ?Thoroughly rinse your body with warm water from the neck down. ? ?DO NOT shower/wash with your normal soap after using and rinsing off the CHG Soap. ? ?Pat yourself dry with a CLEAN TOWEL. ? ?Wear CLEAN PAJAMAS to bed the night before surgery ? ?Place CLEAN SHEETS on your bed the night before your surgery ? ?DO NOT SLEEP WITH PETS. ? ?Reminder: ?Take a shower with CHG soap. ?Wear Clean/Comfortable clothing the morning of surgery ?Do not apply any deodorants/lotions.   ?Remember to brush your teeth WITH YOUR REGULAR TOOTHPASTE. ? ?After your COVID test  ? ?You are not required to quarantine however you are required to wear a well-fitting mask when you are out and around people not in your household.  If your mask becomes wet or soiled, replace with a new one. ? ?Wash your hands often with soap and water for 20 seconds or clean your hands with an alcohol-based hand sanitizer that contains at least 60% alcohol. ? ?Do not share personal items. ? ?Notify your provider: ?if you are in close contact with someone who has COVID  ?or if you develop a fever of 100.4 or greater, sneezing, cough, sore throat, shortness of breath or body aches. ? ?Please read over the following fact sheets that you were given.  ?  ? ? ?  ?

## 2021-07-14 ENCOUNTER — Other Ambulatory Visit: Payer: Self-pay

## 2021-07-14 ENCOUNTER — Encounter (HOSPITAL_COMMUNITY): Payer: Self-pay

## 2021-07-14 ENCOUNTER — Encounter (HOSPITAL_COMMUNITY)
Admission: RE | Admit: 2021-07-14 | Discharge: 2021-07-14 | Disposition: A | Discharge status: Home / Self Care | Payer: Medicare Other | Source: Ambulatory Visit | Attending: Neurosurgery | Admitting: Neurosurgery

## 2021-07-14 VITALS — BP 141/72 | HR 72 | Temp 98.3°F | Resp 17 | Ht 68.0 in | Wt 232.7 lb

## 2021-07-14 DIAGNOSIS — Z01818 Encounter for other preprocedural examination: Secondary | ICD-10-CM

## 2021-07-14 DIAGNOSIS — Z20822 Contact with and (suspected) exposure to covid-19: Secondary | ICD-10-CM | POA: Insufficient documentation

## 2021-07-14 DIAGNOSIS — Z01812 Encounter for preprocedural laboratory examination: Secondary | ICD-10-CM | POA: Insufficient documentation

## 2021-07-14 LAB — CBC
HCT: 44 % (ref 36.0–46.0)
Hemoglobin: 14.6 g/dL (ref 12.0–15.0)
MCH: 28.5 pg (ref 26.0–34.0)
MCHC: 33.2 g/dL (ref 30.0–36.0)
MCV: 85.8 fL (ref 80.0–100.0)
Platelets: 200 10*3/uL (ref 150–400)
RBC: 5.13 MIL/uL — ABNORMAL HIGH (ref 3.87–5.11)
RDW: 13.3 % (ref 11.5–15.5)
WBC: 5.8 10*3/uL (ref 4.0–10.5)
nRBC: 0 % (ref 0.0–0.2)

## 2021-07-14 LAB — SURGICAL PCR SCREEN
MRSA, PCR: NEGATIVE
Staphylococcus aureus: POSITIVE — AB

## 2021-07-14 LAB — SARS CORONAVIRUS 2 (TAT 6-24 HRS): SARS Coronavirus 2: NEGATIVE

## 2021-07-14 NOTE — Progress Notes (Signed)
PCP - Dr. Jonathon Jordan ?Cardiologist - denies ? ?PPM/ICD - denies ? ? ?Chest x-ray - 12/09/14 ?EKG - 08/14/19 ?Stress Test - 2004 per pt, normal ?ECHO - denies ?Cardiac Cath - denies ? ?Sleep Study - 20 years ago (Around 2000, per pt) OSA+ ?CPAP - yes ? ?DM- denies ? ?Blood Thinner Instructions: n/a ?Aspirin Instructions: n/a ? ?ERAS Protcol - no, NPO ? ? ?COVID TEST- 07/14/21 at PAT ? ? ?Anesthesia review: no ? ?Patient denies shortness of breath, fever, cough and chest pain at PAT appointment ? ? ?All instructions explained to the patient, with a verbal understanding of the material. Patient agrees to go over the instructions while at home for a better understanding. Patient also instructed to wear a mask in public after being tested for COVID-19. The opportunity to ask questions was provided. ?  ?

## 2021-07-17 ENCOUNTER — Ambulatory Visit (HOSPITAL_COMMUNITY): Payer: Medicare Other | Admitting: Certified Registered"

## 2021-07-17 ENCOUNTER — Other Ambulatory Visit: Payer: Self-pay

## 2021-07-17 ENCOUNTER — Ambulatory Visit (HOSPITAL_COMMUNITY)
Admission: RE | Admit: 2021-07-17 | Discharge: 2021-07-18 | Disposition: A | Discharge status: Home / Self Care | Payer: Medicare Other | Attending: Neurosurgery | Admitting: Neurosurgery

## 2021-07-17 ENCOUNTER — Ambulatory Visit (HOSPITAL_COMMUNITY): Payer: Medicare Other

## 2021-07-17 ENCOUNTER — Encounter (HOSPITAL_COMMUNITY): Payer: Self-pay | Admitting: Neurosurgery

## 2021-07-17 ENCOUNTER — Ambulatory Visit (HOSPITAL_BASED_OUTPATIENT_CLINIC_OR_DEPARTMENT_OTHER): Payer: Medicare Other | Admitting: Certified Registered"

## 2021-07-17 ENCOUNTER — Encounter (HOSPITAL_COMMUNITY)
Admission: RE | Disposition: A | Discharge status: Home / Self Care | Payer: Self-pay | Source: Home / Self Care | Attending: Neurosurgery

## 2021-07-17 DIAGNOSIS — G4730 Sleep apnea, unspecified: Secondary | ICD-10-CM | POA: Insufficient documentation

## 2021-07-17 DIAGNOSIS — M48062 Spinal stenosis, lumbar region with neurogenic claudication: Secondary | ICD-10-CM | POA: Diagnosis present

## 2021-07-17 DIAGNOSIS — M5416 Radiculopathy, lumbar region: Secondary | ICD-10-CM | POA: Insufficient documentation

## 2021-07-17 DIAGNOSIS — Z981 Arthrodesis status: Secondary | ICD-10-CM | POA: Diagnosis not present

## 2021-07-17 DIAGNOSIS — M4326 Fusion of spine, lumbar region: Secondary | ICD-10-CM | POA: Diagnosis not present

## 2021-07-17 DIAGNOSIS — Z419 Encounter for procedure for purposes other than remedying health state, unspecified: Secondary | ICD-10-CM

## 2021-07-17 HISTORY — PX: LUMBAR LAMINECTOMY/DECOMPRESSION MICRODISCECTOMY: SHX5026

## 2021-07-17 SURGERY — LUMBAR LAMINECTOMY/DECOMPRESSION MICRODISCECTOMY 3 LEVELS
Anesthesia: General | Laterality: Bilateral

## 2021-07-17 MED ORDER — ZOLPIDEM TARTRATE 5 MG PO TABS: 5.0000 mg | ORAL_TABLET | Freq: Every evening | ORAL | Status: DC | PRN

## 2021-07-17 MED ORDER — ACETAMINOPHEN 10 MG/ML IV SOLN
INTRAVENOUS | Status: AC
  Filled 2021-07-17: qty 100

## 2021-07-17 MED ORDER — BUPIVACAINE-EPINEPHRINE (PF) 0.5% -1:200000 IJ SOLN
INTRAMUSCULAR | Status: DC | PRN
  Administered 2021-07-17: 20 mL

## 2021-07-17 MED ORDER — PHENYLEPHRINE 40 MCG/ML (10ML) SYRINGE FOR IV PUSH (FOR BLOOD PRESSURE SUPPORT)
PREFILLED_SYRINGE | INTRAVENOUS | Status: DC | PRN
  Administered 2021-07-17: 80 ug via INTRAVENOUS

## 2021-07-17 MED ORDER — BUPIVACAINE-EPINEPHRINE 0.5% -1:200000 IJ SOLN
INTRAMUSCULAR | Status: AC
  Filled 2021-07-17: qty 1

## 2021-07-17 MED ORDER — LACTATED RINGERS IV SOLN: INTRAVENOUS | Status: DC

## 2021-07-17 MED ORDER — DEXAMETHASONE SODIUM PHOSPHATE 10 MG/ML IJ SOLN
INTRAMUSCULAR | Status: DC | PRN
  Administered 2021-07-17: 5 mg via INTRAVENOUS

## 2021-07-17 MED ORDER — ROCURONIUM BROMIDE 10 MG/ML (PF) SYRINGE
PREFILLED_SYRINGE | INTRAVENOUS | Status: DC | PRN
  Administered 2021-07-17: 20 mg via INTRAVENOUS
  Administered 2021-07-17: 100 mg via INTRAVENOUS

## 2021-07-17 MED ORDER — FENTANYL CITRATE (PF) 250 MCG/5ML IJ SOLN
INTRAMUSCULAR | Status: AC
  Filled 2021-07-17: qty 5

## 2021-07-17 MED ORDER — ACETAMINOPHEN 500 MG PO TABS: 1000.0000 mg | ORAL_TABLET | Freq: Once | ORAL | Status: DC | PRN

## 2021-07-17 MED ORDER — CEFAZOLIN SODIUM-DEXTROSE 2-4 GM/100ML-% IV SOLN
2.0000 g | Freq: Three times a day (TID) | INTRAVENOUS | Status: AC
  Administered 2021-07-17 – 2021-07-18 (×2): 2 g via INTRAVENOUS
  Filled 2021-07-17 (×2): qty 100

## 2021-07-17 MED ORDER — DOCUSATE SODIUM 100 MG PO CAPS
100.0000 mg | ORAL_CAPSULE | Freq: Two times a day (BID) | ORAL | Status: DC
  Administered 2021-07-17 – 2021-07-18 (×2): 100 mg via ORAL
  Filled 2021-07-17 (×2): qty 1

## 2021-07-17 MED ORDER — ACETAMINOPHEN 500 MG PO TABS
1000.0000 mg | ORAL_TABLET | Freq: Four times a day (QID) | ORAL | Status: DC
  Administered 2021-07-18 (×2): 1000 mg via ORAL
  Filled 2021-07-17 (×3): qty 2

## 2021-07-17 MED ORDER — ONDANSETRON HCL 4 MG/2ML IJ SOLN: 4.0000 mg | Freq: Four times a day (QID) | INTRAMUSCULAR | Status: DC | PRN

## 2021-07-17 MED ORDER — AMPHETAMINE-DEXTROAMPHETAMINE 10 MG PO TABS: 5.0000 mg | ORAL_TABLET | Freq: Every day | ORAL | Status: DC | PRN

## 2021-07-17 MED ORDER — SUGAMMADEX SODIUM 200 MG/2ML IV SOLN
INTRAVENOUS | Status: DC | PRN
  Administered 2021-07-17: 200 mg via INTRAVENOUS

## 2021-07-17 MED ORDER — HYDROMORPHONE HCL 2 MG PO TABS
2.0000 mg | ORAL_TABLET | ORAL | Status: DC | PRN
  Administered 2021-07-17 – 2021-07-18 (×4): 2 mg via ORAL
  Filled 2021-07-17: qty 2
  Filled 2021-07-17 (×2): qty 1

## 2021-07-17 MED ORDER — PHENOL 1.4 % MT LIQD: 1.0000 | OROMUCOSAL | Status: DC | PRN

## 2021-07-17 MED ORDER — ROCURONIUM BROMIDE 10 MG/ML (PF) SYRINGE
PREFILLED_SYRINGE | INTRAVENOUS | Status: AC
  Filled 2021-07-17: qty 10

## 2021-07-17 MED ORDER — PROPOFOL 10 MG/ML IV BOLUS
INTRAVENOUS | Status: DC | PRN
  Administered 2021-07-17: 200 mg via INTRAVENOUS

## 2021-07-17 MED ORDER — 0.9 % SODIUM CHLORIDE (POUR BTL) OPTIME
TOPICAL | Status: DC | PRN
  Administered 2021-07-17: 13:00:00 1000 mL

## 2021-07-17 MED ORDER — MENTHOL 3 MG MT LOZG: 1.0000 | LOZENGE | OROMUCOSAL | Status: DC | PRN

## 2021-07-17 MED ORDER — CHLORHEXIDINE GLUCONATE 0.12 % MT SOLN
OROMUCOSAL | Status: AC
  Administered 2021-07-17: 12:00:00 15 mL via OROMUCOSAL
  Filled 2021-07-17: qty 15

## 2021-07-17 MED ORDER — CHLORHEXIDINE GLUCONATE 0.12 % MT SOLN: 15.0000 mL | Freq: Once | OROMUCOSAL | Status: AC

## 2021-07-17 MED ORDER — ORAL CARE MOUTH RINSE: 15.0000 mL | Freq: Once | OROMUCOSAL | Status: AC

## 2021-07-17 MED ORDER — BISACODYL 10 MG RE SUPP: 10.0000 mg | Freq: Every day | RECTAL | Status: DC | PRN

## 2021-07-17 MED ORDER — THROMBIN 5000 UNITS EX SOLR
OROMUCOSAL | Status: DC | PRN
  Administered 2021-07-17: 13:00:00 5 mL via TOPICAL

## 2021-07-17 MED ORDER — CHLORHEXIDINE GLUCONATE CLOTH 2 % EX PADS: 6.0000 | MEDICATED_PAD | Freq: Once | CUTANEOUS | Status: DC

## 2021-07-17 MED ORDER — ACETAMINOPHEN 10 MG/ML IV SOLN
1000.0000 mg | Freq: Once | INTRAVENOUS | Status: DC | PRN
  Administered 2021-07-17: 16:00:00 1000 mg via INTRAVENOUS

## 2021-07-17 MED ORDER — PHENYLEPHRINE 40 MCG/ML (10ML) SYRINGE FOR IV PUSH (FOR BLOOD PRESSURE SUPPORT)
PREFILLED_SYRINGE | INTRAVENOUS | Status: AC
  Filled 2021-07-17: qty 10

## 2021-07-17 MED ORDER — LIDOCAINE 2% (20 MG/ML) 5 ML SYRINGE
INTRAMUSCULAR | Status: DC | PRN
  Administered 2021-07-17: 60 mg via INTRAVENOUS

## 2021-07-17 MED ORDER — CEFAZOLIN SODIUM-DEXTROSE 2-4 GM/100ML-% IV SOLN
INTRAVENOUS | Status: AC
  Filled 2021-07-17: qty 100

## 2021-07-17 MED ORDER — SIMVASTATIN 20 MG PO TABS
10.0000 mg | ORAL_TABLET | Freq: Every day | ORAL | Status: DC
  Administered 2021-07-17: 18:00:00 10 mg via ORAL
  Filled 2021-07-17: qty 1

## 2021-07-17 MED ORDER — FENTANYL CITRATE (PF) 100 MCG/2ML IJ SOLN
INTRAMUSCULAR | Status: AC
  Administered 2021-07-17: 16:00:00 25 ug via INTRAVENOUS
  Filled 2021-07-17: qty 2

## 2021-07-17 MED ORDER — ONDANSETRON HCL 4 MG PO TABS: 4.0000 mg | ORAL_TABLET | Freq: Four times a day (QID) | ORAL | Status: DC | PRN

## 2021-07-17 MED ORDER — SODIUM CHLORIDE 0.9% FLUSH: 3.0000 mL | INTRAVENOUS | Status: DC | PRN

## 2021-07-17 MED ORDER — FENTANYL CITRATE (PF) 100 MCG/2ML IJ SOLN
25.0000 ug | INTRAMUSCULAR | Status: DC | PRN
  Administered 2021-07-17: 16:00:00 50 ug via INTRAVENOUS
  Administered 2021-07-17: 16:00:00 25 ug via INTRAVENOUS

## 2021-07-17 MED ORDER — ACETAMINOPHEN 650 MG RE SUPP: 650.0000 mg | RECTAL | Status: DC | PRN

## 2021-07-17 MED ORDER — THROMBIN 5000 UNITS EX SOLR
CUTANEOUS | Status: AC
  Filled 2021-07-17: qty 5000

## 2021-07-17 MED ORDER — ACETAMINOPHEN 160 MG/5ML PO SOLN: 1000.0000 mg | Freq: Once | ORAL | Status: DC | PRN

## 2021-07-17 MED ORDER — CEFAZOLIN SODIUM-DEXTROSE 2-4 GM/100ML-% IV SOLN
2.0000 g | INTRAVENOUS | Status: AC
  Administered 2021-07-17: 13:00:00 2 g via INTRAVENOUS

## 2021-07-17 MED ORDER — CYCLOBENZAPRINE HCL 10 MG PO TABS
10.0000 mg | ORAL_TABLET | Freq: Three times a day (TID) | ORAL | Status: DC | PRN
  Administered 2021-07-17: 21:00:00 10 mg via ORAL
  Filled 2021-07-17: qty 1

## 2021-07-17 MED ORDER — ACETAMINOPHEN 325 MG PO TABS: 650.0000 mg | ORAL_TABLET | ORAL | Status: DC | PRN

## 2021-07-17 MED ORDER — ALPRAZOLAM 0.5 MG PO TABS: 0.2500 mg | ORAL_TABLET | Freq: Every evening | ORAL | Status: DC | PRN

## 2021-07-17 MED ORDER — FENTANYL CITRATE (PF) 100 MCG/2ML IJ SOLN
INTRAMUSCULAR | Status: AC
  Administered 2021-07-17: 16:00:00 50 ug via INTRAVENOUS
  Filled 2021-07-17: qty 2

## 2021-07-17 MED ORDER — BACITRACIN ZINC 500 UNIT/GM EX OINT
TOPICAL_OINTMENT | CUTANEOUS | Status: AC
  Filled 2021-07-17: qty 28.35

## 2021-07-17 MED ORDER — LIDOCAINE 2% (20 MG/ML) 5 ML SYRINGE
INTRAMUSCULAR | Status: AC
  Filled 2021-07-17: qty 5

## 2021-07-17 MED ORDER — PHENYLEPHRINE HCL-NACL 20-0.9 MG/250ML-% IV SOLN
INTRAVENOUS | Status: DC | PRN
  Administered 2021-07-17: 25 ug/min via INTRAVENOUS

## 2021-07-17 MED ORDER — MORPHINE SULFATE (PF) 4 MG/ML IV SOLN: 4.0000 mg | INTRAVENOUS | Status: DC | PRN

## 2021-07-17 MED ORDER — SODIUM CHLORIDE 0.9% FLUSH: 3.0000 mL | Freq: Two times a day (BID) | INTRAVENOUS | Status: DC

## 2021-07-17 MED ORDER — FENTANYL CITRATE (PF) 250 MCG/5ML IJ SOLN
INTRAMUSCULAR | Status: DC | PRN
Start: 2021-07-17 — End: 2021-07-17
  Administered 2021-07-17: 100 ug via INTRAVENOUS
  Administered 2021-07-17: 50 ug via INTRAVENOUS

## 2021-07-17 MED ORDER — SODIUM CHLORIDE 0.9 % IV SOLN: 250.0000 mL | INTRAVENOUS | Status: DC

## 2021-07-17 SURGICAL SUPPLY — 46 items
BAG COUNTER SPONGE SURGICOUNT (BAG) ×3 IMPLANT
BAND RUBBER #18 3X1/16 STRL (MISCELLANEOUS) ×4 IMPLANT
BENZOIN TINCTURE PRP APPL 2/3 (GAUZE/BANDAGES/DRESSINGS) ×2 IMPLANT
BLADE CLIPPER SURG (BLADE) IMPLANT
BUR MATCHSTICK NEURO 3.0 LAGG (BURR) ×2 IMPLANT
BUR PRECISION FLUTE 6.0 (BURR) ×2 IMPLANT
CANISTER SUCT 3000ML PPV (MISCELLANEOUS) ×2 IMPLANT
CARTRIDGE OIL MAESTRO DRILL (MISCELLANEOUS) ×1 IMPLANT
DIFFUSER DRILL AIR PNEUMATIC (MISCELLANEOUS) ×2 IMPLANT
DRAPE LAPAROTOMY 100X72X124 (DRAPES) ×2 IMPLANT
DRAPE MICROSCOPE LEICA (MISCELLANEOUS) ×2 IMPLANT
DRAPE SURG 17X23 STRL (DRAPES) ×4 IMPLANT
ELECT BLADE 4.0 EZ CLEAN MEGAD (MISCELLANEOUS) ×2
ELECT REM PT RETURN 9FT ADLT (ELECTROSURGICAL) ×2
ELECTRODE BLDE 4.0 EZ CLN MEGD (MISCELLANEOUS) ×1 IMPLANT
ELECTRODE REM PT RTRN 9FT ADLT (ELECTROSURGICAL) ×1 IMPLANT
GAUZE 4X4 16PLY ~~LOC~~+RFID DBL (SPONGE) ×1 IMPLANT
GAUZE SPONGE 4X4 12PLY STRL (GAUZE/BANDAGES/DRESSINGS) ×1 IMPLANT
GLOVE EXAM NITRILE XL STR (GLOVE) IMPLANT
GLOVE SURG ENC MOIS LTX SZ8 (GLOVE) ×2 IMPLANT
GLOVE SURG ENC MOIS LTX SZ8.5 (GLOVE) ×2 IMPLANT
GOWN STRL REUS W/ TWL LRG LVL3 (GOWN DISPOSABLE) IMPLANT
GOWN STRL REUS W/ TWL XL LVL3 (GOWN DISPOSABLE) ×1 IMPLANT
GOWN STRL REUS W/TWL 2XL LVL3 (GOWN DISPOSABLE) IMPLANT
GOWN STRL REUS W/TWL LRG LVL3 (GOWN DISPOSABLE) ×4
GOWN STRL REUS W/TWL XL LVL3 (GOWN DISPOSABLE) ×2
HEMOSTAT POWDER KIT SURGIFOAM (HEMOSTASIS) ×1 IMPLANT
KIT BASIN OR (CUSTOM PROCEDURE TRAY) ×2 IMPLANT
KIT TURNOVER KIT B (KITS) ×2 IMPLANT
NDL HYPO 21X1.5 SAFETY (NEEDLE) IMPLANT
NEEDLE HYPO 21X1.5 SAFETY (NEEDLE) ×2 IMPLANT
NEEDLE HYPO 22GX1.5 SAFETY (NEEDLE) ×2 IMPLANT
NS IRRIG 1000ML POUR BTL (IV SOLUTION) ×2 IMPLANT
OIL CARTRIDGE MAESTRO DRILL (MISCELLANEOUS) ×2
PACK LAMINECTOMY NEURO (CUSTOM PROCEDURE TRAY) ×2 IMPLANT
PAD ARMBOARD 7.5X6 YLW CONV (MISCELLANEOUS) ×5 IMPLANT
PATTIES SURGICAL .5 X1 (DISPOSABLE) IMPLANT
SPONGE SURGIFOAM ABS GEL SZ50 (HEMOSTASIS) ×1 IMPLANT
STRIP CLOSURE SKIN 1/2X4 (GAUZE/BANDAGES/DRESSINGS) ×2 IMPLANT
SUT VIC AB 1 CT1 18XBRD ANBCTR (SUTURE) ×2 IMPLANT
SUT VIC AB 1 CT1 8-18 (SUTURE) ×2
SUT VIC AB 2-0 CP2 18 (SUTURE) ×3 IMPLANT
TAPE STRIPS DRAPE STRL (GAUZE/BANDAGES/DRESSINGS) ×1 IMPLANT
TOWEL GREEN STERILE (TOWEL DISPOSABLE) ×3 IMPLANT
TOWEL GREEN STERILE FF (TOWEL DISPOSABLE) ×2 IMPLANT
WATER STERILE IRR 1000ML POUR (IV SOLUTION) ×2 IMPLANT

## 2021-07-17 NOTE — Op Note (Signed)
Brief history: The patient is a 81 year old white female on whom I performed an L4-5 decompression, instrumentation and fusion years ago.  She has done well until recently when she developed recurrent back and bilateral leg pain consistent with neurogenic claudication.  She failed medical management and worked up with a lumbar myelo CT which demonstrated lumbar spinal stenosis most prominent at L1-2, L2-3 and L3-4.  I discussed the various treatment options with her.  She has decided to proceed with surgery. ? ?Preoperative diagnosis: L1-2, L2-3 and L3-4 spinal stenosis, lumbago, lumbar radiculopathy, neurogenic claudication ? ?Postoperative diagnosis: The same ? ?Procedure: Bilateral L1-2, L2-3 and L3-4 laminotomy/foraminotomy using microdissection  ? ?Surgeon: Dr. Delma Officer ? ?Asst.: None ? ?Anesthesia: Gen. endotracheal ? ?Estimated blood loss: 125 cc ? ?Drains: None ? ?Complications: None ? ?Description of procedure: The patient was brought to the operating room by the anesthesia team. General endotracheal anesthesia was induced. The patient was turned to the prone position on the Wilson frame. The patient's lumbosacral region was then prepared with Betadine scrub and Betadine solution. Sterile drapes were applied. ? ?I then injected the area to be incised with Marcaine with epinephrine solution. I then used a scalpel to make a linear midline incision over the L1-2, L2-3 and L3-4 intervertebral disc space. I then used electrocautery to perform a left sided subperiosteal dissection exposing the spinous process and lamina of L1-2, L2-3 and L3-4. We obtained intraoperative radiograph to confirm our location. I then inserted the [employer] retractor for exposure. ? ?We then brought the operative microscope into the field. Under its magnification and illumination we completed the microdissection. I used a high-speed drill to perform a laminotomy at L1-2 on the left. I then used a Kerrison punches to widen the  laminotomy and removed the ligamentum flavum at L1-2 on the left. We then used microdissection to free up the thecal sacfrom the epidural tissue.  I drilled across the midline and I then used a Kerrison punch to remove the right L1-2 ligamentum flavum.  I then performed a foraminotomy at about the bilateral L2 nerve root.  Completed the decompression this level. ? ?I then repeated this procedure in analogous fashion L2-3 and L3-4 decompressing the thecal sac and the bilateral L3 and L4 nerve roots. ? ?I then palpated along the ventral surface of the thecal sac and along exit route of the bilateral L2, L3 and L4 nerve root and noted that the neural structures were well decompressed. This completed the decompression. ? ?We then obtained hemostasis using bipolar electrocautery. We irrigated the wound out with bacitracin solution. We then removed the retractor. We then reapproximated the patient's thoracolumbar fascia with interrupted #1 Vicryl suture. We then reapproximated the patient's subcutaneous tissue with interrupted 2-0 Vicryl suture. We then reapproximated patient's skin with Steri-Strips and benzoin. The was then coated with bacitracin ointment. The drapes were removed. The patient was subsequently returned to the supine position where they were extubated by the anesthesia team. The patient was then transported to the postanesthesia care unit in stable condition. All sponge instrument and needle counts were reportedly correct at the end of this case. ?  ?

## 2021-07-17 NOTE — Anesthesia Preprocedure Evaluation (Addendum)
Anesthesia Evaluation  ?Patient identified by MRN, date of birth, ID band ?Patient awake ? ?General Assessment Comment:Vocal cord issue after anesthesia and surgery. Oral 7.5 ETT placed. Now resolved.  ? ?Reviewed: ?Allergy & Precautions, NPO status , Patient's Chart, lab work & pertinent test results ? ?History of Anesthesia Complications ?(+) PONV, DIFFICULT AIRWAY and history of anesthetic complications ? ?Airway ?Mallampati: IV ? ?TM Distance: >3 FB ?Neck ROM: Limited ? ? ? Dental ? ?(+) Dental Advisory Given, Teeth Intact ?  ?Pulmonary ?sleep apnea and Continuous Positive Airway Pressure Ventilation ,  ?  ?breath sounds clear to auscultation ? ? ? ? ? ? Cardiovascular ?negative cardio ROS ? ? ?Rhythm:Regular  ?ECG: SR, rate 64 ?  ?Neuro/Psych ?PSYCHIATRIC DISORDERS  Neuromuscular disease   ? GI/Hepatic ?negative GI ROS, Neg liver ROS,   ?Endo/Other  ?negative endocrine ROS ? Renal/GU ?negative Renal ROS  ? ?  ?Musculoskeletal ? ?(+) Arthritis , Chronic back pain  ? Abdominal ?(+) + obese,   ?Peds ? Hematology ?negative hematology ROS ?(+) Lab Results ?     Component                Value               Date                 ?     WBC                      5.8                 07/14/2021           ?     HGB                      14.6                07/14/2021           ?     HCT                      44.0                07/14/2021           ?     MCV                      85.8                07/14/2021           ?     PLT                      200                 07/14/2021           ?   ?Anesthesia Other Findings ?SYMPTOMATIC CHOLELITHIASIS ? Reproductive/Obstetrics ? ?  ? ? ? ? ? ? ? ? ? ? ? ? ? ?  ?  ? ? ? ? ? ? ? ?Anesthesia Physical ?Anesthesia Plan ? ?ASA: 2 ? ?Anesthesia Plan: General  ? ?Post-op Pain Management: Ofirmev IV (intra-op)*  ? ?Induction:  ? ?PONV Risk Score and Plan: 4 or greater ? ?Airway Management Planned: Oral ETT and Video Laryngoscope Planned ? ?Additional  Equipment: None ? ?Intra-op Plan:  ? ?Post-operative Plan: Extubation in OR ? ?  Informed Consent: I have reviewed the patients History and Physical, chart, labs and discussed the procedure including the risks, benefits and alternatives for the proposed anesthesia with the patient or authorized representative who has indicated his/her understanding and acceptance.  ? ? ? ?Dental advisory given ? ?Plan Discussed with: CRNA and Anesthesiologist ? ?Anesthesia Plan Comments:   ? ? ? ? ? ?Anesthesia Quick Evaluation ? ?

## 2021-07-17 NOTE — Transfer of Care (Signed)
Immediate Anesthesia Transfer of Care Note ? ?Patient: Lauren English ? ?Procedure(s) Performed: LUMBAR LAMINECTOMY/FORAMINOTOMY LUMBAR ONE-TWO , LUMBAR TWO- THREE, LUMBAR THREE- FOUR (Bilateral) ? ?Patient Location: PACU ? ?Anesthesia Type:General ? ?Level of Consciousness: awake, alert  and oriented ? ?Airway & Oxygen Therapy: Patient Spontanous Breathing and Patient connected to face mask oxygen ? ?Post-op Assessment: Report given to RN and Post -op Vital signs reviewed and stable ? ?Post vital signs: Reviewed and stable ? ?Last Vitals:  ?Vitals Value Taken Time  ?BP 160/58 07/17/21 1527  ?Temp    ?Pulse 86 07/17/21 1529  ?Resp 12 07/17/21 1529  ?SpO2 100 % 07/17/21 1529  ?Vitals shown include unvalidated device data. ? ?Last Pain:  ?Vitals:  ? 07/17/21 1151  ?TempSrc:   ?PainSc: 0-No pain  ?   ? ?  ? ?Complications: No notable events documented. ?

## 2021-07-17 NOTE — H&P (Signed)
Subjective: The patient is a 81 year old white female on whom I performed an L4-5 decompression, instrumentation and fusion years ago.  She initially did well but has developed recurrent back and bilateral leg pain consistent with neurogenic claudication.  She failed medical management and was worked up with a lumbar myelo CT which demonstrated spinal stenosis at L1-2, L2-3 and L3-4.  I discussed the various treatment options with her.  She has decided proceed with surgery.  Past Medical History:  Diagnosis Date   Anemia    pt claims this was resolved   Arthritis    "qwhere; feet, knees, neck" (04/21/2012)   Bladder incontinence    Chronic back pain    Closed angle glaucoma    "both eyes" (04/21/2012)   Difficult intubation    with intubation on November 26, 2014. Glidescope and 4 noted in op notes. "Difficult Airway- due to reduced neck mobility and Difficult Airway- due to anterior larynx" also noted in anesthesia notes 11-2014   Numbness    fingertips bilat and feet bilat    OSA on CPAP    Overactive bladder    Peripheral neuropathy    feet bilat    Pneumonia    hx. of as child   PONV (postoperative nausea and vomiting)    Prediabetes    Urinary tract bacterial infections    pt states urologist has resolved this so far, but she is "prone to UTIs"    Past Surgical History:  Procedure Laterality Date   ABDOMINAL HYSTERECTOMY  1980   "partial" (04/21/2012)   ANTERIOR FUSION CERVICAL SPINE  12/2005   APPENDECTOMY     BLADDER SUSPENSION  1990   BREAST EXCISIONAL BIOPSY Bilateral    Benign   CHOLECYSTECTOMY N/A 10/17/2019   Procedure: LAPAROSCOPIC CHOLECYSTECTOMY;  Surgeon: Almond Lint, MD;  Location: [facility] OR;  Service: General;  Laterality: N/A;   DILATION AND CURETTAGE OF UTERUS  1980   EYE SURGERY Bilateral 2019   cataracts removed   INTRAOPERATIVE CHOLANGIOGRAM N/A 10/17/2019   Procedure: POSSIBLE INTRAOPERATIVE CHOLANGIOGRAM;  Surgeon: Almond Lint, MD;  Location: [facility] OR;   Service: General;  Laterality: N/A;   IRRIGATION AND DEBRIDEMENT KNEE Left 01/25/2017   Procedure: IRRIGATION AND DEBRIDEMENT KNEE;  Surgeon: Ollen Gross, MD;  Location: [facility];  Service: Orthopedics;  Laterality: Left;   KNEE ARTHROPLASTY Left 06/2008   "slipped on ice; torn ligaments & cartilage" (04/21/2012)   KNEE CLOSED REDUCTION Left 03/25/2015   Procedure: CLOSED MANIPULATION LEFT KNEE;  Surgeon: Ollen Gross, MD;  Location: [facility];  Service: Orthopedics;  Laterality: Left;   KNEE CLOSED REDUCTION Left 11/18/2015   Procedure: LEFT KNEE CLOSED MANIPULATION;  Surgeon: Ollen Gross, MD;  Location: [facility];  Service: Orthopedics;  Laterality: Left;   PATELLAR TENDON REPAIR Left 12/10/2014   Procedure: REPAIR PARTIAL QUAD TENDON TEAR;  Surgeon: Ollen Gross, MD;  Location: [facility];  Service: Orthopedics;  Laterality: Left;   POSTERIOR LUMBAR FUSION  04/21/2012   TONSILLECTOMY AND ADENOIDECTOMY  ~ 1950   TOTAL KNEE ARTHROPLASTY Left 11/26/2014   Procedure: TOTAL LEFT   KNEE ARTHROPLASTY;  Surgeon: Ollen Gross, MD;  Location: [facility];  Service: Orthopedics;  Laterality: Left;   TOTAL KNEE ARTHROPLASTY Right 11/18/2015   Procedure: RIGHT TOTAL KNEE ARTHROPLASTY;  Surgeon: Ollen Gross, MD;  Location: [facility];  Service: Orthopedics;  Laterality: Right;   TUBAL LIGATION  1970's    Allergies  Allergen Reactions   Oxycodone Rash and Itching    Social History  Tobacco Use   Smoking status: Never   Smokeless tobacco: Never  Substance Use Topics   Alcohol use: Yes    Alcohol/week: 1.0 standard drink    Types: 1 Glasses of wine per week    Comment: once per week    Family History  Problem Relation Age of Onset   Breast cancer Maternal Aunt    Prior to Admission medications   Medication Sig Start Date End Date Taking? Authorizing Provider  acetaminophen (TYLENOL) 500 MG tablet Take 1,000 mg by mouth every 6 (six) hours as needed for moderate pain.   Yes [provider]   Acetylcarnitine HCl (ACETYL L-CARNITINE) 500 MG CAPS Take 500 mg by mouth daily.   Yes [provider]  ALPRAZolam (XANAX) 0.25 MG tablet Take 0.25 mg by mouth at bedtime as needed for sleep. 07/12/19  Yes [provider]  b complex vitamins tablet Take 1 tablet by mouth daily.   Yes [provider]  calcium carbonate (OSCAL) 1500 (600 Ca) MG TABS tablet Take 600 mg of elemental calcium by mouth 3 (three) times daily with meals.   Yes [provider]  Cholecalciferol (VITAMIN D3) 2000 units TABS Take 2,000 mg by mouth daily.   Yes [provider]  docusate sodium (COLACE) 100 MG capsule Take 100 mg by mouth 2 (two) times daily.   Yes [provider]  estradiol (ESTRACE) 0.1 MG/GM vaginal cream Place 0.5 Applicatorfuls vaginally every other day. 06/26/21  Yes [provider]  Multiple Vitamins-Minerals (HAIR/SKIN/NAILS) TABS Take 1 tablet by mouth daily.   Yes [provider]  NON FORMULARY Pt uses a cpap nightly   Yes [provider]  Omega-3 Fatty Acids (OMEGA III EPA+DHA PO) Take 1 capsule by mouth in the morning and at bedtime.   Yes [provider]  OVER THE COUNTER MEDICATION Take 2 capsules by mouth daily. Darrold JunkerUqoraStasia Cavalier: Defend   Yes [provider]  OVER THE COUNTER MEDICATION Take 1 capsule by mouth daily. Darrold JunkerUqora: Promote   Yes [provider]  OVER THE COUNTER MEDICATION Take 1 Package by mouth 3 (three) times a week. Darrold JunkerUqora: Flush   Yes [provider]  simvastatin (ZOCOR) 10 MG tablet Take 10 mg by mouth daily. 05/15/21  Yes [provider]  TURMERIC CURCUMIN PO Take 500 mg by mouth in the morning and at bedtime.   Yes [provider]  amphetamine-dextroamphetamine (ADDERALL) 5 MG tablet Take 5 mg by mouth daily as needed (driving long distances). 07/16/20   [provider]     Review of Systems  Positive ROS: As above  All other systems have been reviewed  and were otherwise negative with the exception of those mentioned in the HPI and as above.  Objective: Vital signs in last 24 hours: Temp:  [98.4 F (36.9 C)] 98.4 F (36.9 C) (03/09 1144) Pulse Rate:  [73] 73 (03/09 1144) Resp:  [18] 18 (03/09 1144) BP: (166)/(54) 166/54 (03/09 1144) SpO2:  [94 %] 94 % (03/09 1144) Weight:  [103.4 kg] 103.4 kg (03/09 1144) Estimated body mass index is 34.67 kg/m as calculated from the following:   Height as of this encounter: 5\' 8"  (1.727 m).   Weight as of this encounter: 103.4 kg.   General Appearance: Alert Head: Normocephalic, without obvious abnormality, atraumatic Eyes: PERRL, conjunctiva/corneas clear, EOM's intact,    Ears: Normal  Throat: Normal  Neck: Supple, Back: Lumbar incision is well-healed. Lungs: Clear to auscultation bilaterally, respirations unlabored Heart:  Regular rate and rhythm, no murmur, rub or gallop Abdomen: Soft, non-tender Extremities: Extremities normal, atraumatic, no cyanosis or edema, she has decreased range of motion of the left knee Skin: unremarkable  NEUROLOGIC:   Mental status: alert and oriented,Motor Exam - grossly normal Sensory Exam - grossly normal Reflexes:  Coordination - grossly normal Gait - grossly normal Balance - grossly normal Cranial Nerves: I: smell Not tested  II: visual acuity  OS: Normal  OD: Normal   II: visual fields Full to confrontation  II: pupils Equal, round, reactive to light  III,VII: ptosis None  III,IV,VI: extraocular muscles  Full ROM  V: mastication Normal  V: facial light touch sensation  Normal  V,VII: corneal reflex  Present  VII: facial muscle function - upper  Normal  VII: facial muscle function - lower Normal  VIII: hearing Not tested  IX: soft palate elevation  Normal  IX,X: gag reflex Present  XI: trapezius strength  5/5  XI: sternocleidomastoid strength 5/5  XI: neck flexion strength  5/5  XII: tongue strength  Normal    Data Review Lab Results   Component Value Date   WBC 5.8 07/14/2021   HGB 14.6 07/14/2021   HCT 44.0 07/14/2021   MCV 85.8 07/14/2021   PLT 200 07/14/2021   Lab Results  Component Value Date   NA 139 01/08/2021   K 3.9 01/08/2021   CL 105 01/08/2021   CO2 22 01/08/2021   BUN 17 01/08/2021   CREATININE 0.91 01/08/2021   GLUCOSE 107 (H) 01/08/2021   Lab Results  Component Value Date   INR 1.01 11/08/2015    Assessment/Plan: Lumbar spinal stenosis, lumbago, lumbar radiculopathy, neurogenic claudication: I have discussed the situation with the patient.  I reviewed her imaging studies with her and pointed out the abnormalities.  We have discussed the various treatment options including surgery.  I have described the surgical treatment option of bilateral L1-2, L2-3 and L3-4 laminotomy/foraminotomies to treat her spinal stenosis.  I have shown her surgical models.  I have given her a surgical template.  We have discussed the risk, benefits, alternatives, expected postop course, and likelihood of achieving our goals with surgery.  I have answered all her questions.  She has decided to proceed with surgery.   Cristi Loron 07/17/2021 12:20 PM

## 2021-07-17 NOTE — Anesthesia Procedure Notes (Signed)
Procedure Name: Intubation ?Date/Time: 07/17/2021 1:08 PM ?Performed by: Lanell Matar, CRNA ?Pre-anesthesia Checklist: Patient identified, Emergency Drugs available, Suction available and Patient being monitored ?Patient Re-evaluated:Patient Re-evaluated prior to induction ?Oxygen Delivery Method: Circle System Utilized ?Preoxygenation: Pre-oxygenation with 100% oxygen ?Induction Type: IV induction ?Ventilation: Mask ventilation without difficulty and Oral airway inserted - appropriate to patient size ?Laryngoscope Size: Glidescope and 3 ?Grade View: Grade I ?Tube type: Oral ?Tube size: 7.0 mm ?Number of attempts: 1 ?Airway Equipment and Method: Stylet and Oral airway ?Placement Confirmation: ETT inserted through vocal cords under direct vision, positive ETCO2 and breath sounds checked- equal and bilateral ?Secured at: 22 cm ?Tube secured with: Tape ?Dental Injury: Teeth and Oropharynx as per pre-operative assessment  ?Difficulty Due To: Difficulty was anticipated, Difficult Airway- due to reduced neck mobility, Difficult Airway- due to dentition and Difficult Airway- due to anterior larynx ?Comments: Elective glidescope due to history of difficult intubation. Smooth induction, easy mask with OA, AOI without difficulty.  ? ? ? ? ?

## 2021-07-18 ENCOUNTER — Encounter (HOSPITAL_COMMUNITY): Payer: Self-pay | Admitting: Neurosurgery

## 2021-07-18 DIAGNOSIS — M5416 Radiculopathy, lumbar region: Secondary | ICD-10-CM | POA: Diagnosis not present

## 2021-07-18 DIAGNOSIS — G4730 Sleep apnea, unspecified: Secondary | ICD-10-CM | POA: Diagnosis not present

## 2021-07-18 DIAGNOSIS — M48062 Spinal stenosis, lumbar region with neurogenic claudication: Secondary | ICD-10-CM | POA: Diagnosis not present

## 2021-07-18 MED ORDER — OXYCODONE-ACETAMINOPHEN 5-325 MG PO TABS: 1.0000 | ORAL_TABLET | ORAL | Status: DC | PRN

## 2021-07-18 MED ORDER — HYDROMORPHONE HCL 2 MG PO TABS: 2.0000 mg | ORAL_TABLET | ORAL | 0 refills | Status: AC | PRN

## 2021-07-18 NOTE — Anesthesia Postprocedure Evaluation (Signed)
Anesthesia Post Note ? ?Patient: Lauren English ? ?Procedure(s) Performed: LUMBAR LAMINECTOMY/FORAMINOTOMY LUMBAR ONE-TWO , LUMBAR TWO- THREE, LUMBAR THREE- FOUR (Bilateral) ? ?  ? ?Patient location during evaluation: PACU ?Anesthesia Type: General ?Level of consciousness: awake and alert ?Pain management: pain level controlled ?Vital Signs Assessment: post-procedure vital signs reviewed and stable ?Respiratory status: spontaneous breathing, nonlabored ventilation, respiratory function stable and patient connected to nasal cannula oxygen ?Cardiovascular status: blood pressure returned to baseline and stable ?Postop Assessment: no apparent nausea or vomiting ?Anesthetic complications: no ? ? ?No notable events documented. ? ?Last Vitals:  ?Vitals:  ? 07/18/21 0411 07/18/21 0724  ?BP: (!) 142/56 (!) 123/46  ?Pulse: 63 62  ?Resp: 18 16  ?Temp: 37 ?C 37 ?C  ?SpO2: 97% 94%  ?  ?Last Pain:  ?Vitals:  ? 07/18/21 1101  ?TempSrc:   ?PainSc: 2   ? ? ?  ?  ?  ?  ?  ?  ? ?[name] ? ? ? ? ?

## 2021-07-18 NOTE — Progress Notes (Signed)
Patient awaiting transport via wheelchair by volunteer for discharge home; in no acute distress nor complaints of pain nor discomfort; incision on her back with honeycomb dressing and is clean, dry and intact; room was checked and accounted for all her belongings; discharge instructions concerning her medications, incision care, follow up appointment and when to call the doctor as needed were all discussed with patient and her stepdaughter by RN and both verbalized understanding on the instructions given.  ?  ?  ?  ? ?

## 2021-07-18 NOTE — Discharge Summary (Signed)
Physician Discharge Summary  ?Patient ID: ?Lauren English ?MRN: [id number] ?DOB/AGE: 1941-04-21 81 y.o. ? ?Admit date: 07/17/2021 ?Discharge date: 07/18/2021 ? ?Admission Diagnoses: Lumbar spinal stenosis with neurogenic claudication, lumbago, lumbar radiculopathy ? ?Discharge Diagnoses: Lumbago, lumbar radiculopathy ?Principal Problem: ?  Spinal stenosis of lumbar region with neurogenic claudication ? ? ?Discharged Condition: good ? ?Hospital Course: I performed bilateral L1-2, L2-3 and L3-4 laminotomy/foraminotomies on the patient on 07/17/2021.  The surgery went well. ? ?The patient's postoperative course was unremarkable.  On postoperative day #1 she requested discharge home.  The patient, and her stepdaughter, were given verbal and written discharge instructions.  All their questions were answered. ? ?Consults: PT, OT, care management ?Significant Diagnostic Studies: None ?Treatments: Bilateral L1-2, L2-3 and L3-4 laminotomy/foraminotomies using microdissection ?Discharge Exam: ?Blood pressure (!) 123/46, pulse 62, temperature 98.6 ?F (37 ?C), temperature source Oral, resp. rate 16, height 5\' 8"  (1.727 m), weight 103.4 kg, SpO2 94 %. ?The patient is alert and pleasant.  Her strength is normal.  She looks well. ? ?Disposition: Home ? ?Discharge Instructions   ? ? Call MD for:  difficulty breathing, headache or visual disturbances   Complete by: As directed ?  ? Call MD for:  extreme fatigue   Complete by: As directed ?  ? Call MD for:  hives   Complete by: As directed ?  ? Call MD for:  persistant dizziness or light-headedness   Complete by: As directed ?  ? Call MD for:  persistant nausea and vomiting   Complete by: As directed ?  ? Call MD for:  redness, tenderness, or signs of infection (pain, swelling, redness, odor or green/yellow discharge around incision site)   Complete by: As directed ?  ? Call MD for:  severe uncontrolled pain   Complete by: As directed ?  ? Call MD for:  temperature >100.4   Complete by: As  directed ?  ? Diet - low sodium heart healthy   Complete by: As directed ?  ? Discharge instructions   Complete by: As directed ?  ? Call [phone] for a followup appointment. Take a stool softener while you are using pain medications.  ? Driving Restrictions   Complete by: As directed ?  ? Do not drive for 2 weeks.  ? Increase activity slowly   Complete by: As directed ?  ? Lifting restrictions   Complete by: As directed ?  ? Do not lift more than 5 pounds. No excessive bending or twisting.  ? May shower / Bathe   Complete by: As directed ?  ? Remove the dressing for 3 days after surgery.  You may shower, but leave the incision alone.  ? Remove dressing in 48 hours   Complete by: As directed ?  ? ?  ? ?Allergies as of 07/18/2021   ? ?   Reactions  ? Oxycodone Rash, Itching  ? ?  ? ?  ?Medication List  ?  ? ?STOP taking these medications   ? ?Vitamin D3 50 MCG (2000 UT) Tabs ?  ? ?  ? ?TAKE these medications   ? ?acetaminophen 500 MG tablet ?Commonly known as: TYLENOL ?Take 1,000 mg by mouth every 6 (six) hours as needed for moderate pain. ?  ?Acetyl L-Carnitine 500 MG Caps ?Take 500 mg by mouth daily. ?  ?ALPRAZolam 0.25 MG tablet ?Commonly known as: Duanne Moron ?Take 0.25 mg by mouth at bedtime as needed for sleep. ?  ?amphetamine-dextroamphetamine 5 MG tablet ?Commonly known as: ADDERALL ?  Take 5 mg by mouth daily as needed (driving long distances). ?  ?b complex vitamins tablet ?Take 1 tablet by mouth daily. ?  ?calcium carbonate 1500 (600 Ca) MG Tabs tablet ?Commonly known as: OSCAL ?Take 600 mg of elemental calcium by mouth 3 (three) times daily with meals. ?  ?docusate sodium 100 MG capsule ?Commonly known as: COLACE ?Take 100 mg by mouth 2 (two) times daily. ?  ?estradiol 0.1 MG/GM vaginal cream ?Commonly known as: ESTRACE ?Place 0.5 Applicatorfuls vaginally every other day. ?  ?Hair/Skin/Nails Tabs ?Take 1 tablet by mouth daily. ?  ?HYDROmorphone 2 MG tablet ?Commonly known as: DILAUDID ?Take 1-2 tablets (2-4 mg  total) by mouth every 4 (four) hours as needed for moderate pain. ?  ?NON FORMULARY ?Pt uses a cpap nightly ?  ?OMEGA III EPA+DHA PO ?Take 1 capsule by mouth in the morning and at bedtime. ?  ?OVER THE COUNTER MEDICATION ?Take 2 capsules by mouth daily. Jonette EvaGeorgeanne Nim ?  ?OVER THE COUNTER MEDICATION ?Take 1 capsule by mouth daily. Jonette Eva: Promote ?  ?OVER THE COUNTER MEDICATION ?Take 1 Package by mouth 3 (three) times a week. Uqora: Flush ?  ?simvastatin 10 MG tablet ?Commonly known as: ZOCOR ?Take 10 mg by mouth daily. ?  ?TURMERIC CURCUMIN PO ?Take 500 mg by mouth in the morning and at bedtime. ?  ? ?  ? ? ? ?Signed: ?Ophelia Charter ?07/18/2021, 8:14 AM ? ? ? ? ?

## 2021-07-18 NOTE — Evaluation (Signed)
Occupational Therapy Evaluation ?Patient Details ?Name: Lauren English ?MRN: [id number] ?DOB: 07-16-1940 ?Today's Date: 07/18/2021 ? ? ?History of Present Illness 81 y.o. F admitted on 07/17/21 for bilateral L1-2, L2-3 and L3-4 laminotomy/foraminotomies. PMH significant for Arthritis, Glaucoma, Peripheral neuropathy, and prediabetes.  ? ?Clinical Impression ?  ?Pt admitted for concerns listed above. PTA pt reported that she was independent with all ADL's and IADL's, including quilting. At this time, pt demonstrates independence with all BADL's, following compensatory strategies. She has no further OT needs and acute OT will sign off.  ?   ? ?Recommendations for follow up therapy are one component of a multi-disciplinary discharge planning process, led by the attending physician.  Recommendations may be updated based on patient status, additional functional criteria and insurance authorization.  ? ?Follow Up Recommendations ? No OT follow up  ?  ?Assistance Recommended at Discharge PRN  ?Patient can return home with the following A little help with bathing/dressing/bathroom;Assistance with cooking/housework ? ?  ?Functional Status Assessment ? Patient has had a recent decline in their functional status and demonstrates the ability to make significant improvements in function in a reasonable and predictable amount of time.  ?Equipment Recommendations ? None recommended by OT  ?  ?Recommendations for Other Services   ? ? ?  ?Precautions / Restrictions Precautions ?Precautions: Back ?Precaution Booklet Issued: Yes (comment) ?Precaution Comments: Reviewed precautions and compensatory strategies. ?Restrictions ?Weight Bearing Restrictions: No  ? ?  ? ?Mobility Bed Mobility ?Overal bed mobility: Modified Independent ?  ?  ?  ?  ?  ?  ?General bed mobility comments: completed log roll with no assist ?  ? ?Transfers ?Overall transfer level: Modified independent ?Equipment used: None ?  ?  ?  ?  ?  ?  ?  ?General transfer  comment: increased time for steadying ?  ? ?  ?Balance Overall balance assessment: No apparent balance deficits (not formally assessed) ?  ?  ?  ?  ?  ?  ?  ?  ?  ?  ?  ?  ?  ?  ?  ?  ?  ?  ?   ? ?ADL either performed or assessed with clinical judgement  ? ?ADL Overall ADL's : At baseline ?  ?  ?  ?  ?  ?  ?  ?  ?  ?  ?  ?  ?  ?  ?  ?  ?  ?  ?  ?General ADL Comments: Pt able to complete all ADL's with no difficulties, safely, following compensatory strategies.  ? ? ? ?Vision Baseline Vision/History: 1 Wears glasses ?Ability to See in Adequate Light: 0 Adequate ?Patient Visual Report: No change from baseline ?Vision Assessment?: No apparent visual deficits  ?   ?Perception   ?  ?Praxis   ?  ? ?Pertinent Vitals/Pain Pain Assessment ?Pain Assessment: No/denies pain  ? ? ? ?Hand Dominance Right ?  ?Extremity/Trunk Assessment Upper Extremity Assessment ?Upper Extremity Assessment: Generalized weakness ?  ?Lower Extremity Assessment ?Lower Extremity Assessment: Defer to PT evaluation ?  ?Cervical / Trunk Assessment ?Cervical / Trunk Assessment: Back Surgery ?  ?Communication Communication ?Communication: No difficulties ?  ?Cognition Arousal/Alertness: Awake/alert ?Behavior During Therapy: [facility] for tasks assessed/performed ?Overall Cognitive Status: Within Functional Limits for tasks assessed ?  ?  ?  ?  ?  ?  ?  ?  ?  ?  ?  ?  ?  ?  ?  ?  ?  ?  ?  ?  General Comments  VSS on RA, honeycomb dressing pulling up some at the bottom, RN aware ? ?  ?Exercises   ?  ?Shoulder Instructions    ? ? ?Home Living Family/patient expects to be discharged to:: Private residence ?Living Arrangements: Alone ?Available Help at Discharge: Family ?Type of Home: House ?Home Access: Stairs to enter ?Entrance Stairs-Number of Steps: 2 steps ?  ?Home Layout: Multi-level;Able to live on main level with bedroom/bathroom ?  ?  ?Bathroom Shower/Tub: Walk-in shower ?  ?Bathroom Toilet: Handicapped height ?Bathroom Accessibility: Yes ?How Accessible:  Accessible via walker ?Home Equipment: Rolling Walker (2 wheels);BSC/3in1;Shower seat;Grab bars - toilet;Grab bars - tub/shower;Hand held shower head ?  ?  ?  ? ?  ?Prior Functioning/Environment Prior Level of Function : Independent/Modified Independent;Driving ?  ?  ?  ?  ?  ?  ?  ?  ?  ? ?  ?  ?OT Problem List: Decreased strength;Decreased activity tolerance;Impaired balance (sitting and/or standing);Pain ?  ?   ?OT Treatment/Interventions:    ?  ?OT Goals(Current goals can be found in the care plan section) Acute Rehab OT Goals ?Patient Stated Goal: To go home ?OT Goal Formulation: All assessment and education complete, DC therapy ?Time For Goal Achievement: 07/18/21 ?Potential to Achieve Goals: Good  ?OT Frequency:   ?  ? ?Co-evaluation   ?  ?  ?  ?  ? ?  ?AM-PAC OT "6 Clicks" Daily Activity     ?Outcome Measure Help from another person eating meals?: None ?Help from another person taking care of personal grooming?: None ?Help from another person toileting, which includes using toliet, bedpan, or urinal?: None ?Help from another person bathing (including washing, rinsing, drying)?: None ?Help from another person to put on and taking off regular upper body clothing?: None ?Help from another person to put on and taking off regular lower body clothing?: None ?6 Click Score: 24 ?  ?End of Session Nurse Communication: Mobility status ? ?Activity Tolerance: Patient tolerated treatment well ?Patient left: Other (comment) (sitting EOB) ? ?OT Visit Diagnosis: Unsteadiness on feet (R26.81);Other abnormalities of gait and mobility (R26.89);Muscle weakness (generalized) (M62.81)  ?              ?Time: [id number] ?OT Time Calculation (min): 26 min ?Charges:  OT General Charges ?$OT Visit: 1 Visit ?OT Evaluation ?$OT Eval Moderate Complexity: 1 Mod ?OT Treatments ?$Self Care/Home Management : 8-22 mins ? ?Nashay Brickley H., OTR/L ?Acute Rehabilitation ? ?Najla Aughenbaugh Elane Yolanda Bonine ?07/18/2021, 11:02 AM ?

## 2021-07-18 NOTE — Plan of Care (Signed)
?  Problem: Education: Goal: Ability to verbalize activity precautions or restrictions will improve Outcome: Completed/Met Goal: Knowledge of the prescribed therapeutic regimen will improve Outcome: Completed/Met Goal: Understanding of discharge needs will improve Outcome: Completed/Met   Problem: Activity: Goal: Ability to avoid complications of mobility impairment will improve Outcome: Completed/Met Goal: Ability to tolerate increased activity will improve Outcome: Completed/Met Goal: Will remain free from falls Outcome: Completed/Met   Problem: Bowel/Gastric: Goal: Gastrointestinal status for postoperative course will improve Outcome: Completed/Met   Problem: Clinical Measurements: Goal: Ability to maintain clinical measurements within normal limits will improve Outcome: Completed/Met Goal: Postoperative complications will be avoided or minimized Outcome: Completed/Met Goal: Diagnostic test results will improve Outcome: Completed/Met   Problem: Pain Management: Goal: Pain level will decrease Outcome: Completed/Met   Problem: Skin Integrity: Goal: Will show signs of wound healing Outcome: Completed/Met   Problem: Health Behavior/Discharge Planning: Goal: Identification of resources available to assist in meeting health care needs will improve Outcome: Completed/Met   Problem: Bladder/Genitourinary: Goal: Urinary functional status for postoperative course will improve Outcome: Completed/Met   

## 2021-07-18 NOTE — Evaluation (Signed)
Physical Therapy Evaluation and Discharge ?Patient Details ?Name: Lauren English ?MRN: [id number] ?DOB: [birth year] ?Today's Date: 07/18/2021 ? ?History of Present Illness ? Pt is a 81 y/o F admitted s/p for bilateral L1-L4 laminotomy/foraminotomies on 07/17/2021. PMH significant for prior TKR (L), Glaucoma, Peripheral neuropathy, and prediabetes.  ?Clinical Impression ? Patient evaluated by Physical Therapy with no further acute PT needs identified. All education has been completed and the patient has no further questions. Pt was able to demonstrate transfers and ambulation with gross min guard assist and no AD. Pt will have adequate assistance available at home initially upon d/c. Pt was educated on precautions, positioning recommendations, appropriate activity progression, and car transfer. See below for any follow-up Physical Therapy or equipment needs. PT is signing off. Thank you for this referral.    ?   ? ?Recommendations for follow up therapy are one component of a multi-disciplinary discharge planning process, led by the attending physician.  Recommendations may be updated based on patient status, additional functional criteria and insurance authorization. ? ?Follow Up Recommendations No PT follow up ? ?  ?Assistance Recommended at Discharge Intermittent Supervision/Assistance  ?Patient can return home with the following ? A little help with walking and/or transfers;Assist for transportation;Help with stairs or ramp for entrance ? ?  ?Equipment Recommendations None recommended by PT  ?Recommendations for Other Services ?    ?  ?Functional Status Assessment Patient has had a recent decline in their functional status and demonstrates the ability to make significant improvements in function in a reasonable and predictable amount of time.  ? ?  ?Precautions / Restrictions Precautions ?Precautions: Back;Fall ?Precaution Booklet Issued: Yes (comment) ?Precaution Comments: Reviewed handout and pt was cued for  precautions during functional mobility. ?Restrictions ?Weight Bearing Restrictions: No  ? ?  ? ?Mobility ? Bed Mobility ?  ?  ?  ?  ?  ?  ?  ?General bed mobility comments: Reviewed log roll technique. Pt reports having wedge to sleep on at home. ?  ? ?Transfers ?Overall transfer level: Needs assistance ?Equipment used: None ?Transfers: Sit to/from Stand ?Sit to Stand: Min guard ?  ?  ?  ?  ?  ?General transfer comment: Pt was able to power up to full stand without assist. VC's for optimal posture and wide BOS to ease transfers. ?  ? ?Ambulation/Gait ?Ambulation/Gait assistance: Min guard ?Gait Distance (Feet): 500 Feet ?Assistive device: None ?Gait Pattern/deviations: Step-through pattern, Decreased stride length, Trunk flexed, Antalgic ?Gait velocity: Decreased ?Gait velocity interpretation: 1.31 - 2.62 ft/sec, indicative of limited community ambulator ?  ?General Gait Details: VC's for improved posture throughout gait training. Pt mildly antalgic 2? L knee deficits. Unsteady at times but no assist required and no overt LOB noted. ? ?Stairs ?Stairs: Yes ?Stairs assistance: Min guard ?Stair Management: One rail Right, Step to pattern, Forwards ?Number of Stairs: 10 ?General stair comments: VC's for sequencing and general safety. Pt was able to negotiate a full flight of stairs without difficulty. ? ?Wheelchair Mobility ?  ? ?Modified Rankin (Stroke Patients Only) ?  ? ?  ? ?Balance Overall balance assessment: Mild deficits observed, not formally tested ?  ?  ?  ?  ?  ?  ?  ?  ?  ?  ?  ?  ?  ?  ?  ?  ?  ?  ?   ? ? ? ?Pertinent Vitals/Pain Pain Assessment ?Pain Assessment: Faces ?Faces Pain Scale: Hurts little more ?Pain Location: Incision site ?Pain Descriptors /  Indicators: Operative site guarding, Sore ?Pain Intervention(s): Limited activity within patient's tolerance, Monitored during session, Repositioned  ? ? ?Home Living Family/patient expects to be discharged to:: Private residence ?Living Arrangements:  Alone ?Available Help at Discharge: Family ?Type of Home: House ?Home Access: Stairs to enter ?Entrance Stairs-Rails: Right ?Entrance Stairs-Number of Steps: 3 ?Alternate Level Stairs-Number of Steps: flight ?Home Layout: Multi-level;Able to live on main level with bedroom/bathroom ?Home Equipment: Rolling Walker (2 wheels);BSC/3in1;Shower seat;Grab bars - toilet;Grab bars - tub/shower;Hand held shower head ?Additional Comments: Sewing room upstairs, laundry downstairs, pt able to stay on main level.  ?  ?Prior Function Prior Level of Function : Independent/Modified Independent ?  ?  ?  ?  ?  ?  ?  ?  ?  ? ? ?Hand Dominance  ? Dominant Hand: Right ? ?  ?Extremity/Trunk Assessment  ? Upper Extremity Assessment ?Upper Extremity Assessment: Defer to OT evaluation ?  ? ?Lower Extremity Assessment ?Lower Extremity Assessment: Generalized weakness;LLE deficits/detail ?LLE Deficits / Details: Decreased strength and AROM from prior TKR. Pt reports ~90? flexion in L knee. ?  ? ?Cervical / Trunk Assessment ?Cervical / Trunk Assessment: Back Surgery  ?Communication  ? Communication: No difficulties  ?Cognition Arousal/Alertness: Awake/alert ?Behavior During Therapy: [facility] for tasks assessed/performed ?Overall Cognitive Status: Within Functional Limits for tasks assessed ?  ?  ?  ?  ?  ?  ?  ?  ?  ?  ?  ?  ?  ?  ?  ?  ?  ?  ?  ? ?  ?General Comments General comments (skin integrity, edema, etc.): VSS on RA, honeycomb dressing pulling up some at the bottom, RN aware ? ?  ?Exercises    ? ?Assessment/Plan  ?  ?PT Assessment Patient does not need any further PT services  ?PT Problem List   ? ?   ?  ?PT Treatment Interventions     ? ?PT Goals (Current goals can be found in the Care Plan section)  ?Acute Rehab PT Goals ?Patient Stated Goal: Be independent at home ?PT Goal Formulation: All assessment and education complete, DC therapy ? ?  ?Frequency   ?  ? ? ?Co-evaluation   ?  ?  ?  ?  ? ? ?  ?AM-PAC PT "6 Clicks" Mobility  ?Outcome  Measure Help needed turning from your back to your side while in a flat bed without using bedrails?: None ?Help needed moving from lying on your back to sitting on the side of a flat bed without using bedrails?: None ?Help needed moving to and from a bed to a chair (including a wheelchair)?: A Little ?Help needed standing up from a chair using your arms (e.g., wheelchair or bedside chair)?: A Little ?Help needed to walk in hospital room?: None ?Help needed climbing 3-5 steps with a railing? : A Little ?6 Click Score: 21 ? ?  ?End of Session   ?Activity Tolerance: Patient tolerated treatment well ?Patient left: in chair;with call bell/phone within reach;with family/visitor present ?Nurse Communication: Mobility status ?PT Visit Diagnosis: Unsteadiness on feet (R26.81);Pain ?Pain - part of body:  (back) ?  ? ?Time: [id number] ?PT Time Calculation (min) (ACUTE ONLY): 22 min ? ? ?Charges:   PT Evaluation ?$PT Eval Low Complexity: 1 Low ?  ?  ?   ? ? ?Rolinda Roan, PT, DPT ?Acute Rehabilitation Services ?Pager: [phone] ?Office: [phone]  ? ?Lauren English ?07/18/2021, 11:10 AM ? ?

## 2021-07-21 DIAGNOSIS — Z48811 Encounter for surgical aftercare following surgery on the nervous system: Secondary | ICD-10-CM | POA: Diagnosis not present

## 2021-07-21 DIAGNOSIS — E669 Obesity, unspecified: Secondary | ICD-10-CM | POA: Diagnosis not present

## 2021-07-23 DIAGNOSIS — E669 Obesity, unspecified: Secondary | ICD-10-CM | POA: Diagnosis not present

## 2021-07-23 DIAGNOSIS — Z48811 Encounter for surgical aftercare following surgery on the nervous system: Secondary | ICD-10-CM | POA: Diagnosis not present

## 2021-07-25 DIAGNOSIS — Z48811 Encounter for surgical aftercare following surgery on the nervous system: Secondary | ICD-10-CM | POA: Diagnosis not present

## 2021-07-25 DIAGNOSIS — E669 Obesity, unspecified: Secondary | ICD-10-CM | POA: Diagnosis not present

## 2021-07-28 DIAGNOSIS — Z79899 Other long term (current) drug therapy: Secondary | ICD-10-CM | POA: Diagnosis not present

## 2021-07-28 DIAGNOSIS — E7800 Pure hypercholesterolemia, unspecified: Secondary | ICD-10-CM | POA: Diagnosis not present

## 2021-07-28 DIAGNOSIS — Z0000 Encounter for general adult medical examination without abnormal findings: Secondary | ICD-10-CM | POA: Diagnosis not present

## 2021-07-28 DIAGNOSIS — I700 Atherosclerosis of aorta: Secondary | ICD-10-CM | POA: Diagnosis not present

## 2021-07-28 DIAGNOSIS — R7303 Prediabetes: Secondary | ICD-10-CM | POA: Diagnosis not present

## 2021-07-28 DIAGNOSIS — Z23 Encounter for immunization: Secondary | ICD-10-CM | POA: Diagnosis not present

## 2021-07-28 DIAGNOSIS — E559 Vitamin D deficiency, unspecified: Secondary | ICD-10-CM | POA: Diagnosis not present

## 2021-07-28 DIAGNOSIS — D692 Other nonthrombocytopenic purpura: Secondary | ICD-10-CM | POA: Diagnosis not present

## 2021-07-28 DIAGNOSIS — F330 Major depressive disorder, recurrent, mild: Secondary | ICD-10-CM | POA: Diagnosis not present

## 2021-07-28 DIAGNOSIS — G4730 Sleep apnea, unspecified: Secondary | ICD-10-CM | POA: Diagnosis not present

## 2021-07-28 DIAGNOSIS — M179 Osteoarthritis of knee, unspecified: Secondary | ICD-10-CM | POA: Diagnosis not present

## 2021-07-29 DIAGNOSIS — E669 Obesity, unspecified: Secondary | ICD-10-CM | POA: Diagnosis not present

## 2021-07-29 DIAGNOSIS — Z48811 Encounter for surgical aftercare following surgery on the nervous system: Secondary | ICD-10-CM | POA: Diagnosis not present

## 2021-08-01 DIAGNOSIS — E669 Obesity, unspecified: Secondary | ICD-10-CM | POA: Diagnosis not present

## 2021-08-01 DIAGNOSIS — Z48811 Encounter for surgical aftercare following surgery on the nervous system: Secondary | ICD-10-CM | POA: Diagnosis not present

## 2021-08-04 DIAGNOSIS — Z48811 Encounter for surgical aftercare following surgery on the nervous system: Secondary | ICD-10-CM | POA: Diagnosis not present

## 2021-08-04 DIAGNOSIS — E669 Obesity, unspecified: Secondary | ICD-10-CM | POA: Diagnosis not present

## 2021-09-24 DIAGNOSIS — N390 Urinary tract infection, site not specified: Secondary | ICD-10-CM | POA: Diagnosis not present

## 2021-09-25 DIAGNOSIS — Z87440 Personal history of urinary (tract) infections: Secondary | ICD-10-CM | POA: Diagnosis not present

## 2021-09-25 DIAGNOSIS — N952 Postmenopausal atrophic vaginitis: Secondary | ICD-10-CM | POA: Diagnosis not present

## 2021-12-09 DIAGNOSIS — G8929 Other chronic pain: Secondary | ICD-10-CM | POA: Diagnosis not present

## 2021-12-09 DIAGNOSIS — M48062 Spinal stenosis, lumbar region with neurogenic claudication: Secondary | ICD-10-CM | POA: Diagnosis not present

## 2021-12-23 ENCOUNTER — Other Ambulatory Visit: Payer: Self-pay

## 2021-12-23 ENCOUNTER — Encounter: Payer: Self-pay | Admitting: Physical Therapy

## 2021-12-23 ENCOUNTER — Ambulatory Visit: Payer: Medicare Other | Attending: Neurosurgery | Admitting: Physical Therapy

## 2021-12-23 DIAGNOSIS — R262 Difficulty in walking, not elsewhere classified: Secondary | ICD-10-CM | POA: Diagnosis not present

## 2021-12-23 DIAGNOSIS — M62830 Muscle spasm of back: Secondary | ICD-10-CM | POA: Diagnosis not present

## 2021-12-23 DIAGNOSIS — M5459 Other low back pain: Secondary | ICD-10-CM | POA: Insufficient documentation

## 2021-12-23 NOTE — Therapy (Signed)
OUTPATIENT PHYSICAL THERAPY THORACOLUMBAR EVALUATION   Patient Name: Lauren English MRN: [id number] DOB:1941-01-18, 81 y.o., female Today's Date: 12/23/2021   PT End of Session - 12/23/21 1715     Visit Number 1    Date for PT Re-Evaluation 02/13/22    Authorization Type Medicare    Authorization Time Period 12/23/21 to 02/13/22    PT Start Time 1603    PT Stop Time 1700    PT Time Calculation (min) 57 min    Activity Tolerance No increased pain;Patient tolerated treatment well    Behavior During Therapy [facility] for tasks assessed/performed             Past Medical History:  Diagnosis Date   Anemia    pt claims this was resolved   Arthritis    "qwhere; feet, knees, neck" (04/21/2012)   Bladder incontinence    Chronic back pain    Closed angle glaucoma    "both eyes" (04/21/2012)   Difficult intubation    with intubation on November 26, 2014. Glidescope and 4 noted in op notes. "Difficult Airway- due to reduced neck mobility and Difficult Airway- due to anterior larynx" also noted in anesthesia notes 11-2014   Numbness    fingertips bilat and feet bilat    OSA on CPAP    Overactive bladder    Peripheral neuropathy    feet bilat    Pneumonia    hx. of as child   PONV (postoperative nausea and vomiting)    Prediabetes    Urinary tract bacterial infections    pt states urologist has resolved this so far, but she is "prone to UTIs"   Past Surgical History:  Procedure Laterality Date   ABDOMINAL HYSTERECTOMY  1980   "partial" (04/21/2012)   ANTERIOR FUSION CERVICAL SPINE  12/2005   APPENDECTOMY     BLADDER SUSPENSION  1990   BREAST EXCISIONAL BIOPSY Bilateral    Benign   CHOLECYSTECTOMY N/A 10/17/2019   Procedure: LAPAROSCOPIC CHOLECYSTECTOMY;  Surgeon: Stark Klein, MD;  Location: [place];  Service: General;  Laterality: N/A;   [place] Bilateral 2019   cataracts removed   INTRAOPERATIVE CHOLANGIOGRAM N/A 10/17/2019    Procedure: POSSIBLE INTRAOPERATIVE CHOLANGIOGRAM;  Surgeon: Stark Klein, MD;  Location: [place];  Service: General;  Laterality: N/A;   IRRIGATION AND DEBRIDEMENT KNEE Left 01/25/2017   Procedure: IRRIGATION AND DEBRIDEMENT KNEE;  Surgeon: Gaynelle Arabian, MD;  Location: [facility];  Service: Orthopedics;  Laterality: Left;   KNEE ARTHROPLASTY Left 06/2008   "slipped on ice; torn ligaments & cartilage" (04/21/2012)   KNEE CLOSED REDUCTION Left 03/25/2015   Procedure: CLOSED MANIPULATION LEFT KNEE;  Surgeon: Gaynelle Arabian, MD;  Location: [facility];  Service: Orthopedics;  Laterality: Left;   KNEE CLOSED REDUCTION Left 11/18/2015   Procedure: LEFT KNEE CLOSED MANIPULATION;  Surgeon: Gaynelle Arabian, MD;  Location: [facility];  Service: Orthopedics;  Laterality: Left;   LUMBAR LAMINECTOMY/DECOMPRESSION MICRODISCECTOMY Bilateral 07/17/2021   Procedure: LUMBAR LAMINECTOMY/FORAMINOTOMY LUMBAR ONE-TWO , LUMBAR TWO- THREE, LUMBAR THREE- FOUR;  Surgeon: Newman Pies, MD;  Location: [place];  Service: Neurosurgery;  Laterality: Bilateral;   PATELLAR TENDON REPAIR Left 12/10/2014   Procedure: REPAIR PARTIAL QUAD TENDON TEAR;  Surgeon: Gaynelle Arabian, MD;  Location: [facility];  Service: Orthopedics;  Laterality: Left;   POSTERIOR LUMBAR FUSION  04/21/2012   TONSILLECTOMY AND ADENOIDECTOMY  ~ 1950   TOTAL KNEE ARTHROPLASTY Left 11/26/2014   Procedure: TOTAL  LEFT   KNEE ARTHROPLASTY;  Surgeon: Ollen Gross, MD;  Location: [facility];  Service: Orthopedics;  Laterality: Left;   TOTAL KNEE ARTHROPLASTY Right 11/18/2015   Procedure: RIGHT TOTAL KNEE ARTHROPLASTY;  Surgeon: Ollen Gross, MD;  Location: [facility];  Service: Orthopedics;  Laterality: Right;   TUBAL LIGATION  1970's   Patient Active Problem List   Diagnosis Date Noted   Spinal stenosis of lumbar region with neurogenic claudication 07/17/2021   Chronic calculous cholecystitis 10/17/2019   Abscess of knee, left 01/25/2017   Arthrofibrosis of total knee arthroplasty  (HCC) 03/25/2015   Quadriceps tendon rupture 12/09/2014   OA (osteoarthritis) of knee 11/26/2014    PCP: [name], MD  REFERRING PROVIDER: Tressie Stalker, MD  REFERRING DIAG: Spinal Stenosis, Lumbar region with neurogenic claudication  Rationale for Evaluation and Treatment Rehabilitation  THERAPY DIAG:  Other low back pain  Muscle spasm of back  ONSET DATE: April 2023  SUBJECTIVE:                                                                                                                                                                                           SUBJECTIVE STATEMENT: Pt had lumbar surgery 07/17/21. She was doing great with her recovery, but in April she started to have pain with standing/bending for longer than . Denies numbness/tingling in the LEs. Pain is just in low back. Pain goes away immediately upon sitting. Pt works out 2x/week at [employer] with a Psychologist, educational. Denies back pain with this.  PERTINENT HISTORY:    PAIN:  Are you having pain? Yes: NPRS scale: 0/10 Pain location: low lumbar spine Pain description: aching Aggravating factors: standing 15 min+, bending, twisting-mopping or sweeping Relieving factors: sitting   PRECAUTIONS: None  WEIGHT BEARING RESTRICTIONS No  FALLS:  Has patient fallen in last 6 months? 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  PLOF: Independent  PATIENT GOALS improve pain with standing/household activity    OBJECTIVE:   DIAGNOSTIC FINDINGS:    PATIENT SURVEYS:  FOTO 40  SCREENING FOR RED FLAGS: Bowel or bladder incontinence: No Spinal tumors: No Cauda equina syndrome: No Compression fracture: No Abdominal aneurysm:  No  COGNITION:  Overall cognitive status: Within functional limits for tasks assessed     SENSATION: WFL Not tested- pt denies numbness/tingling  MUSCLE LENGTH: Hamstrings: Right  deg; Left  deg Maisie Fus test: Right  deg;  Left  deg  POSTURE: rounded shoulders and forward head  PALPATION: Scar adhesions noted lumbar spine from recent surgery, tenderness lumbar paraspinals  LUMBAR ROM:   Active  A/PROM  eval  Flexion WNL  Extension Limited 50% pain end range   Right lateral flexion   Left lateral flexion   Right rotation WNL  Left rotation WNL   (Blank rows = not tested)  LOWER EXTREMITY ROM:     Active  Right eval Left eval  Hip flexion    Hip extension    Hip abduction    Hip adduction    Hip internal rotation 25 20  Hip external rotation 30 20  Knee flexion    Knee extension    Ankle dorsiflexion    Ankle plantarflexion    Ankle inversion    Ankle eversion     (Blank rows = not tested)  LOWER EXTREMITY MMT:    MMT Right eval Left eval  Hip flexion 5 5  Hip extension 5 5  Hip abduction 5 5  Hip adduction    Hip internal rotation    Hip external rotation    Knee flexion    Knee extension    Ankle dorsiflexion    Ankle plantarflexion    Ankle inversion    Ankle eversion     (Blank rows = not tested)  LUMBAR SPECIAL TESTS:    FUNCTIONAL TESTS:  5 times sit to stand: 13 sec, Lt LE unable to make it under table  GAIT:  Comments: decreased hip extension, decreased trunk rotation     TODAY'S TREATMENT   Supine hip 90/90 rotation stretch- making several adjustments and modifications to exercise set up because of limited Lt knee ROM Self-care: bed mobility-log roll verbal and demo instructions    PATIENT EDUCATION:  Education details: eval findings/POC Person educated: Patient Education method: Programmer, multimedia, Facilities manager, and Handouts Education comprehension: verbalized understanding and returned demonstration   HOME EXERCISE  PROGRAM: Access Code: [facility] URL: [email or web address] Date: 12/23/2021 Prepared by: [facility]  Exercises - Supine Hip Internal and External Rotation  - 2 x daily - 7 x weekly - 15 reps  ASSESSMENT:  CLINICAL IMPRESSION: Patient is a 81 y.o. F who was seen today for physical therapy evaluation and treatment for LBP following lumbar laminectomy/microdiscectomy on 07/17/21. She denies numbness and tingling. Pt has history of multiple Lt knee surgeries and complications post TKA which resulted in significantly limited Lt knee ROM (<90 deg flexion). She has low back spasm with bed mobility and daily activity standing/walking for greater than 15 minutes. Scar adhesions noted at surgical site. Limited hip rotation ROM likely contributes to her low back strain with daily activity and bending.    OBJECTIVE IMPAIRMENTS Abnormal gait, decreased activity tolerance, decreased mobility, decreased ROM, decreased strength, hypomobility, increased fascial restrictions, increased muscle spasms, impaired flexibility, improper body mechanics, and pain.   ACTIVITY LIMITATIONS bending, standing, squatting, stairs, and bed mobility  PARTICIPATION LIMITATIONS: cleaning, laundry, community activity, yard work, and sewing  PERSONAL FACTORS Age, Past/current experiences, and 3 comorbidities: Lt knee ROM limitations post op complications, Lumbar surgery x2, hip arthritis are also affecting patient's functional outcome.  are also affecting patient's functional outcome.   REHAB POTENTIAL: Good  CLINICAL DECISION MAKING: Evolving/moderate complexity  EVALUATION COMPLEXITY: Moderate   GOALS: Goals reviewed with patient? Yes  SHORT TERM GOALS: Target date: 01/06/2022  Pt will be independent with her initial HEP to improve lumbar pain and  LE strength.  Baseline: Goal status: INITIAL  2.  Pt will demo proper log roll technique with bed mobility  during session, without the need for PT cuing.  Baseline:  Goal status: INITIAL      LONG TERM GOALS: Target date: 02/03/2022  Pt will have greater than 35 deg hip IR and ER bilaterally to improve her mechanics with walking and rotational activity.  Baseline: 20-30 deg rotation bilaterally, Lt worse than Rt  Goal status: INITIAL  2.  Pt will have improved trunk strength evident by her ability to get in/out of bed without increase in low back pain.  Baseline: painful Goal status: INITIAL  3.  Pt will be able to demonstrate proper mechanics with picking up objects from the floor without excessive lumbar flexion compensation. Baseline:  Goal status: INITIAL  4.  Pt will report  being able to stand and complete mopping/sweeping activity for greater than 15 minutes without increase in low back pain.  Baseline:  Goal status: INITIAL  5.  Pt's FOTO score will increased to atleast 49 from start of PT. Baseline: 40 Goal status: INITIAL     PLAN: PT FREQUENCY: 1x/week  PT DURATION: 6 weeks  PLANNED INTERVENTIONS: Therapeutic exercises, Therapeutic activity, Neuromuscular re-education, Gait training, Patient/Family education, Self Care, Joint mobilization, Dry Needling, Spinal mobilization, Moist heat, Taping, Manual therapy, and Re-evaluation.  PLAN FOR NEXT SESSION: add to HEP; trunk strengthening including rotation, hip ER and IR rotation flexibility- need to consider Lt knee ROM limitations; scar mobilization   5:18 PM,12/23/21 Sherol Dade PT, DPT [place] [facility] [place]

## 2022-01-01 ENCOUNTER — Ambulatory Visit: Payer: Medicare Other

## 2022-01-01 DIAGNOSIS — M62830 Muscle spasm of back: Secondary | ICD-10-CM

## 2022-01-01 DIAGNOSIS — M5459 Other low back pain: Secondary | ICD-10-CM

## 2022-01-01 DIAGNOSIS — R262 Difficulty in walking, not elsewhere classified: Secondary | ICD-10-CM | POA: Diagnosis not present

## 2022-01-01 NOTE — Therapy (Signed)
OUTPATIENT PHYSICAL THERAPY TREATMENT NOTE   Patient Name: Lauren English MRN: [id number] DOB:June 20, 1940, 81 y.o., female Today's Date: 01/01/2022  PCP: [name], MD REFERRING PROVIDER: Tressie Stalker, MD  END OF SESSION:   PT End of Session - 01/01/22 1530     Visit Number 2    Date for PT Re-Evaluation 02/13/22    Authorization Type Medicare    Authorization Time Period 12/23/21 to 02/13/22    PT Start Time 1530    PT Stop Time 1615    PT Time Calculation (min) 45 min    Activity Tolerance No increased pain;Patient tolerated treatment well    Behavior During Therapy [facility] for tasks assessed/performed             Past Medical History:  Diagnosis Date   Anemia    pt claims this was resolved   Arthritis    "qwhere; feet, knees, neck" (04/21/2012)   Bladder incontinence    Chronic back pain    Closed angle glaucoma    "both eyes" (04/21/2012)   Difficult intubation    with intubation on November 26, 2014. Glidescope and 4 noted in op notes. "Difficult Airway- due to reduced neck mobility and Difficult Airway- due to anterior larynx" also noted in anesthesia notes 11-2014   Numbness    fingertips bilat and feet bilat    OSA on CPAP    Overactive bladder    Peripheral neuropathy    feet bilat    Pneumonia    hx. of as child   PONV (postoperative nausea and vomiting)    Prediabetes    Urinary tract bacterial infections    pt states urologist has resolved this so far, but she is "prone to UTIs"   Past Surgical History:  Procedure Laterality Date   ABDOMINAL HYSTERECTOMY  1980   "partial" (04/21/2012)   ANTERIOR FUSION CERVICAL SPINE  12/2005   APPENDECTOMY     BLADDER SUSPENSION  1990   BREAST EXCISIONAL BIOPSY Bilateral    Benign   CHOLECYSTECTOMY N/A 10/17/2019   Procedure: LAPAROSCOPIC CHOLECYSTECTOMY;  Surgeon: Almond Lint, MD;  Location: [facility];  Service: General;  Laterality: N/A;   DILATION AND CURETTAGE OF UTERUS  1980   EYE SURGERY Bilateral 2019    cataracts removed   INTRAOPERATIVE CHOLANGIOGRAM N/A 10/17/2019   Procedure: POSSIBLE INTRAOPERATIVE CHOLANGIOGRAM;  Surgeon: Almond Lint, MD;  Location: [facility];  Service: General;  Laterality: N/A;   IRRIGATION AND DEBRIDEMENT KNEE Left 01/25/2017   Procedure: IRRIGATION AND DEBRIDEMENT KNEE;  Surgeon: Ollen Gross, MD;  Location: [facility] ORS;  Service: Orthopedics;  Laterality: Left;   KNEE ARTHROPLASTY Left 06/2008   "slipped on ice; torn ligaments & cartilage" (04/21/2012)   KNEE CLOSED REDUCTION Left 03/25/2015   Procedure: CLOSED MANIPULATION LEFT KNEE;  Surgeon: Ollen Gross, MD;  [facility]: [facility];  Service: Orthopedics;  Laterality: Left;   KNEE CLOSED REDUCTION Left 11/18/2015   Procedure: LEFT KNEE CLOSED MANIPULATION;  Surgeon: Ollen Gross, MD;  [facility];  Service: Orthopedics;  Laterality: Left;   LUMBAR LAMINECTOMY/DECOMPRESSION MICRODISCECTOMY Bilateral 07/17/2021   Procedure: LUMBAR LAMINECTOMY/FORAMINOTOMY LUMBAR ONE-TWO , LUMBAR TWO- THREE, LUMBAR THREE- FOUR;  Surgeon: Tressie Stalker, MD;  Location: [facility] OR;  Service: Neurosurgery;  Laterality: Bilateral;   PATELLAR TENDON REPAIR Left 12/10/2014   Procedure: REPAIR PARTIAL QUAD TENDON TEAR;  Surgeon: Ollen Gross, MD;  [facility];  Service: Orthopedics;  Laterality: Left;   POSTERIOR LUMBAR FUSION  04/21/2012   TONSILLECTOMY AND ADENOIDECTOMY  ~  1950   TOTAL KNEE ARTHROPLASTY Left 11/26/2014   Procedure: TOTAL LEFT   KNEE ARTHROPLASTY;  Surgeon: Ollen Gross, MD;  [facility];  Service: Orthopedics;  Laterality: Left;   TOTAL KNEE ARTHROPLASTY Right 11/18/2015   Procedure: RIGHT TOTAL KNEE ARTHROPLASTY;  Surgeon: Ollen Gross, MD;  [facility];  Service: Orthopedics;  Laterality: Right;   TUBAL LIGATION  1970's   Patient Active Problem List   Diagnosis Date Noted   Spinal stenosis of lumbar region with neurogenic claudication 07/17/2021   Chronic calculous cholecystitis 10/17/2019   Abscess of  knee, left 01/25/2017   Arthrofibrosis of total knee arthroplasty (HCC) 03/25/2015   Quadriceps tendon rupture 12/09/2014   OA (osteoarthritis) of knee 11/26/2014    REFERRING DIAG: Spinal Stenosis, Lumbar region with neurogenic claudication  THERAPY DIAG:  Other low back pain  Muscle spasm of back  Difficulty in walking, not elsewhere classified  Rationale for Evaluation and Treatment Rehabilitation  PERTINENT HISTORY: lumbar laminectomy 07-17-21 (fusion L4-L5 fusion and cervical fusion)  PRECAUTIONS: none  SUBJECTIVE: Patient states she is doing ok.  She is working out with a Psychologist, educational at [employer].  She rates her pain at 3/10 today.    PAIN:  Are you having pain? Yes: NPRS scale: 3/10 Pain location: low back Pain description: mild ache Aggravating factors: prolonged standing Relieving factors: meds, exercises   OBJECTIVE: (objective measures completed at initial evaluation unless otherwise dated)   OBJECTIVE:    DIAGNOSTIC FINDINGS:      PATIENT SURVEYS:  FOTO 40   SCREENING FOR RED FLAGS: Bowel or bladder incontinence: No Spinal tumors: No Cauda equina syndrome: No Compression fracture: No Abdominal aneurysm: No   COGNITION:           Overall cognitive status: Within functional limits for tasks assessed                          SENSATION: WFL Not tested- pt denies numbness/tingling   MUSCLE LENGTH: Hamstrings: Right  deg; Left  deg Maisie Fus test: Right  deg; Left  deg   POSTURE: rounded shoulders and forward head   PALPATION: Scar adhesions noted lumbar spine from recent surgery, tenderness lumbar paraspinals   LUMBAR ROM:    Active  A/PROM  eval  Flexion WNL  Extension Limited 50% pain end range   Right lateral flexion    Left lateral flexion    Right rotation WNL  Left rotation WNL   (Blank rows = not tested)   LOWER EXTREMITY ROM:      Active  Right eval Left eval  Hip flexion      Hip extension      Hip abduction      Hip adduction       Hip internal rotation 25 20  Hip external rotation 30 20  Knee flexion      Knee extension      Ankle dorsiflexion      Ankle plantarflexion      Ankle inversion      Ankle eversion       (Blank rows = not tested)   LOWER EXTREMITY MMT:     MMT Right eval Left eval  Hip flexion 5 5  Hip extension 5 5  Hip abduction 5 5  Hip adduction      Hip internal rotation      Hip external rotation      Knee flexion      Knee  extension      Ankle dorsiflexion      Ankle plantarflexion      Ankle inversion      Ankle eversion       (Blank rows = not tested)   LUMBAR SPECIAL TESTS:      FUNCTIONAL TESTS:  5 times sit to stand: 13 sec, Lt LE unable to make it under table   GAIT:   Comments: decreased hip extension, decreased trunk rotation        TODAY'S TREATMENT  01/01/22: Nustep x 5 min level 3 (PT present to discuss status) Supine hamstring stretch with strap 3 x 20 sec each both Supine IT band/ lateral hip stretch 3 x 20 sec both Lower trunk rotation x 20 PPT x 20 PPT with 90/90 heel tap x 20 Updated and reviewed HEP   12/23/21: Supine hip 90/90 rotation stretch- making several adjustments and modifications to exercise set up because of limited Lt knee ROM Self-care: bed mobility-log roll verbal and demo instructions      PATIENT EDUCATION:  Education details: eval findings/POC Person educated: Patient Education method: Explanation, Demonstration, and Handouts Education comprehension: verbalized understanding and returned demonstration     HOME EXERCISE PROGRAM: Access Code: [facility] URL: [email or web address] Date: 01/01/2022 Prepared by: Mikey Kirschner  Exercises - Supine Hip Internal and External Rotation  - 2 x daily - 7 x weekly - 15 reps - Supine Hamstring Stretch with Strap  - 1 x daily - 7 x weekly - 1 sets - 2 reps - 20 sec hold - Supine ITB Stretch with Strap  - 1 x daily - 7 x weekly - 1 sets - 2 reps - 30 sec hold - Supine  Lower Trunk Rotation  - 1 x daily - 7 x weekly - 1 sets - 10 reps - Supine Posterior Pelvic Tilt  - 1 x daily - 7 x weekly - 2 sets - 10 reps - Supine 90/90 Alternating Heel Touches with Posterior Pelvic Tilt  - 1 x daily - 7 x weekly - 2 sets - 10 repsAccess Code: [facility] URL: [email or web address] Date: 12/23/2021 Prepared by: [facility]   Exercises - Supine Hip Internal and External Rotation  - 2 x daily - 7 x weekly - 15 reps   ASSESSMENT:   CLINICAL IMPRESSION: Patient was able to do all activities today with minimal pain.  She needed some education on each exercise as to purpose of each along with lumbar spine anatomy.  She appears to be very compliant and well motivated.  We added to her HEP and handouts provided.  She should continue to do well.  She would benefit from continued skilled PT for core stabilization and LE flexibility exercises.       OBJECTIVE IMPAIRMENTS Abnormal gait, decreased activity tolerance, decreased mobility, decreased ROM, decreased strength, hypomobility, increased fascial restrictions, increased muscle spasms, impaired flexibility, improper body mechanics, and pain.    ACTIVITY LIMITATIONS bending, standing, squatting, stairs, and bed mobility   PARTICIPATION LIMITATIONS: cleaning, laundry, community activity, yard work, and sewing   PERSONAL FACTORS Age, Past/current experiences, and 3 comorbidities: Lt knee ROM limitations post op complications, Lumbar surgery x2, hip arthritis are also affecting patient's functional outcome.  are also affecting patient's functional outcome.    REHAB POTENTIAL: Good   CLINICAL DECISION MAKING: Evolving/moderate complexity   EVALUATION COMPLEXITY: Moderate     GOALS: Goals reviewed with patient? Yes   SHORT TERM GOALS: Target date:  01/06/2022   Pt will be independent with her initial HEP to improve lumbar pain and LE strength.  Baseline: Goal status:  INITIAL   2.  Pt will demo proper log roll technique with bed mobility during session, without the need for PT cuing.  Baseline:  Goal status: INITIAL           LONG TERM GOALS: Target date: 02/03/2022   Pt will have greater than 35 deg hip IR and ER bilaterally to improve her mechanics with walking and rotational activity.  Baseline: 20-30 deg rotation bilaterally, Lt worse than Rt  Goal status: INITIAL   2.  Pt will have improved trunk strength evident by her ability to get in/out of bed without increase in low back pain.  Baseline: painful Goal status: INITIAL   3.  Pt will be able to demonstrate proper mechanics with picking up objects from the floor without excessive lumbar flexion compensation. Baseline:  Goal status: INITIAL   4.  Pt will report  being able to stand and complete mopping/sweeping activity for greater than 15 minutes without increase in low back pain.  Baseline:  Goal status: INITIAL   5.  Pt's FOTO score will increased to atleast 49 from start of PT. Baseline: 40 Goal status: INITIAL         PLAN: PT FREQUENCY: 1x/week   PT DURATION: 6 weeks   PLANNED INTERVENTIONS: Therapeutic exercises, Therapeutic activity, Neuromuscular re-education, Gait training, Patient/Family education, Self Care, Joint mobilization, Dry Needling, Spinal mobilization, Moist heat, Taping, Manual therapy, and Re-evaluation.   PLAN FOR NEXT SESSION: add to HEP; trunk strengthening including rotation, hip ER and IR rotation flexibility- need to consider Lt knee ROM limitations; scar mobilization   Nesbit Michon B. Sabrinna Yearwood, PT 01/01/22 5:21 PM  [facility] [address], [address] [place], [place] [address] Phone # [phone] Fax [phone]

## 2022-01-06 ENCOUNTER — Ambulatory Visit: Payer: Medicare Other

## 2022-01-06 DIAGNOSIS — M5459 Other low back pain: Secondary | ICD-10-CM | POA: Diagnosis not present

## 2022-01-06 DIAGNOSIS — R262 Difficulty in walking, not elsewhere classified: Secondary | ICD-10-CM

## 2022-01-06 DIAGNOSIS — M62830 Muscle spasm of back: Secondary | ICD-10-CM | POA: Diagnosis not present

## 2022-01-06 NOTE — Therapy (Signed)
OUTPATIENT PHYSICAL THERAPY TREATMENT NOTE   Patient Name: Lauren English MRN: [id number] DOB:Jun 21, 1940, 81 y.o., female Today's Date: 01/06/2022  PCP: [name], MD REFERRING PROVIDER: Tressie Stalker, MD  END OF SESSION:   PT End of Session - 01/06/22 1622     Visit Number 3    Date for PT Re-Evaluation 02/13/22    Authorization Type Medicare    Authorization Time Period 12/23/21 to 02/13/22    PT Start Time 1622    PT Stop Time 1700    PT Time Calculation (min) 38 min    Activity Tolerance No increased pain;Patient tolerated treatment well    Behavior During Therapy [facility] for tasks assessed/performed             Past Medical History:  Diagnosis Date   Anemia    pt claims this was resolved   Arthritis    "qwhere; feet, knees, neck" (04/21/2012)   Bladder incontinence    Chronic back pain    Closed angle glaucoma    "both eyes" (04/21/2012)   Difficult intubation    with intubation on November 26, 2014. Glidescope and 4 noted in op notes. "Difficult Airway- due to reduced neck mobility and Difficult Airway- due to anterior larynx" also noted in anesthesia notes 11-2014   Numbness    fingertips bilat and feet bilat    OSA on CPAP    Overactive bladder    Peripheral neuropathy    feet bilat    Pneumonia    hx. of as child   PONV (postoperative nausea and vomiting)    Prediabetes    Urinary tract bacterial infections    pt states urologist has resolved this so far, but she is "prone to UTIs"   Past Surgical History:  Procedure Laterality Date   ABDOMINAL HYSTERECTOMY  1980   "partial" (04/21/2012)   ANTERIOR FUSION CERVICAL SPINE  12/2005   APPENDECTOMY     BLADDER SUSPENSION  1990   BREAST EXCISIONAL BIOPSY Bilateral    Benign   CHOLECYSTECTOMY N/A 10/17/2019   Procedure: LAPAROSCOPIC CHOLECYSTECTOMY;  Surgeon: Almond Lint, MD;  Location: [facility] OR;  Service: General;  Laterality: N/A;   DILATION AND CURETTAGE OF UTERUS  1980   EYE SURGERY Bilateral 2019    cataracts removed   INTRAOPERATIVE CHOLANGIOGRAM N/A 10/17/2019   Procedure: POSSIBLE INTRAOPERATIVE CHOLANGIOGRAM;  Surgeon: Almond Lint, MD;  Location: [facility] OR;  Service: General;  Laterality: N/A;   IRRIGATION AND DEBRIDEMENT KNEE Left 01/25/2017   Procedure: IRRIGATION AND DEBRIDEMENT KNEE;  Surgeon: Ollen Gross, MD;  [facility]: [facility];  Service: Orthopedics;  Laterality: Left;   KNEE ARTHROPLASTY Left 06/2008   "slipped on ice; torn ligaments & cartilage" (04/21/2012)   KNEE CLOSED REDUCTION Left 03/25/2015   Procedure: CLOSED MANIPULATION LEFT KNEE;  Surgeon: Ollen Gross, MD;  Location: [facility];  Service: Orthopedics;  Laterality: Left;   KNEE CLOSED REDUCTION Left 11/18/2015   Procedure: LEFT KNEE CLOSED MANIPULATION;  Surgeon: Ollen Gross, MD;  [facility]: [facility];  Service: Orthopedics;  Laterality: Left;   LUMBAR LAMINECTOMY/DECOMPRESSION MICRODISCECTOMY Bilateral 07/17/2021   Procedure: LUMBAR LAMINECTOMY/FORAMINOTOMY LUMBAR ONE-TWO , LUMBAR TWO- THREE, LUMBAR THREE- FOUR;  Surgeon: Tressie Stalker, MD;  Location: [facility] OR;  Service: Neurosurgery;  Laterality: Bilateral;   PATELLAR TENDON REPAIR Left 12/10/2014   Procedure: REPAIR PARTIAL QUAD TENDON TEAR;  Surgeon: Ollen Gross, MD;  Location: [facility];  Service: Orthopedics;  Laterality: Left;   POSTERIOR LUMBAR FUSION  04/21/2012   TONSILLECTOMY AND ADENOIDECTOMY  ~  1950   TOTAL KNEE ARTHROPLASTY Left 11/26/2014   Procedure: TOTAL LEFT   KNEE ARTHROPLASTY;  Surgeon: Ollen Gross, MD;  [facility]: [facility];  Service: Orthopedics;  Laterality: Left;   TOTAL KNEE ARTHROPLASTY Right 11/18/2015   Procedure: RIGHT TOTAL KNEE ARTHROPLASTY;  Surgeon: Ollen Gross, MD;  Location: [facility];  Service: Orthopedics;  Laterality: Right;   TUBAL LIGATION  1970's   Patient Active Problem List   Diagnosis Date Noted   Spinal stenosis of lumbar region with neurogenic claudication 07/17/2021   Chronic calculous cholecystitis 10/17/2019   Abscess of  knee, left 01/25/2017   Arthrofibrosis of total knee arthroplasty (HCC) 03/25/2015   Quadriceps tendon rupture 12/09/2014   OA (osteoarthritis) of knee 11/26/2014    REFERRING DIAG: Spinal Stenosis, Lumbar region with neurogenic claudication  THERAPY DIAG:  Other low back pain  Muscle spasm of back  Difficulty in walking, not elsewhere classified  Rationale for Evaluation and Treatment Rehabilitation  PERTINENT HISTORY: lumbar laminectomy 07-17-21 (fusion L4-L5 fusion and cervical fusion)  PRECAUTIONS: none  SUBJECTIVE: Patient states had her workout this morning.  I just feel pressure.  Not really having pain.    PAIN:  Are you having pain? Yes: NPRS scale: 0/10 Pain location: low back Pain description: mild ache Aggravating factors: prolonged standing Relieving factors: meds, exercises   OBJECTIVE: (objective measures completed at initial evaluation unless otherwise dated)   OBJECTIVE:    DIAGNOSTIC FINDINGS:      PATIENT SURVEYS:  FOTO 40   SCREENING FOR RED FLAGS: Bowel or bladder incontinence: No Spinal tumors: No Cauda equina syndrome: No Compression fracture: No Abdominal aneurysm: No   COGNITION:           Overall cognitive status: Within functional limits for tasks assessed                          SENSATION: WFL Not tested- pt denies numbness/tingling   MUSCLE LENGTH: Hamstrings: Right  deg; Left  deg Maisie Fus test: Right  deg; Left  deg   POSTURE: rounded shoulders and forward head   PALPATION: Scar adhesions noted lumbar spine from recent surgery, tenderness lumbar paraspinals   LUMBAR ROM:    Active  A/PROM  eval  Flexion WNL  Extension Limited 50% pain end range   Right lateral flexion    Left lateral flexion    Right rotation WNL  Left rotation WNL   (Blank rows = not tested)   LOWER EXTREMITY ROM:      Active  Right eval Left eval  Hip flexion      Hip extension      Hip abduction      Hip adduction      Hip internal  rotation 25 20  Hip external rotation 30 20  Knee flexion      Knee extension      Ankle dorsiflexion      Ankle plantarflexion      Ankle inversion      Ankle eversion       (Blank rows = not tested)   LOWER EXTREMITY MMT:     MMT Right eval Left eval  Hip flexion 5 5  Hip extension 5 5  Hip abduction 5 5  Hip adduction      Hip internal rotation      Hip external rotation      Knee flexion      Knee extension      Ankle  dorsiflexion      Ankle plantarflexion      Ankle inversion      Ankle eversion       (Blank rows = not tested)   LUMBAR SPECIAL TESTS:      FUNCTIONAL TESTS:  5 times sit to stand: 13 sec, Lt LE unable to make it under table   GAIT:   Comments: decreased hip extension, decreased trunk rotation        TODAY'S TREATMENT  01/06/22: Nustep x 5 min level 3 (PT present to discuss status) Supine hamstring stretch with strap 2 x 30 sec each both Supine IT band/ lateral hip stretch 3 x 20 sec both Lower trunk rotation x 20 PPT x 20 PPT with 90/90 heel tap x 20 PPT with SLR to plyo ball shoulder flexion (red plyo ball)  2 x 10 each LE PPT with dying bug unsupported x 20  01/01/22: Nustep x 5 min level 3 (PT present to discuss status) Supine hamstring stretch with strap 3 x 20 sec each both Supine IT band/ lateral hip stretch 3 x 20 sec both Lower trunk rotation x 20 PPT x 20 PPT with 90/90 heel tap x 20 Updated and reviewed HEP   12/23/21: Supine hip 90/90 rotation stretch- making several adjustments and modifications to exercise set up because of limited Lt knee ROM Self-care: bed mobility-log roll verbal and demo instructions      PATIENT EDUCATION:  Education details: eval findings/POC Person educated: Patient Education method: Explanation, Demonstration, and Handouts Education comprehension: verbalized understanding and returned demonstration     HOME EXERCISE PROGRAM: Access Code: [facility] URL:  [email or web address] Date: 01/06/2022 Prepared by: Mikey Kirschner  Exercises - Supine Hip Internal and External Rotation  - 2 x daily - 7 x weekly - 15 reps - Supine Hamstring Stretch with Strap  - 1 x daily - 7 x weekly - 1 sets - 2 reps - 20 sec hold - Supine ITB Stretch with Strap  - 1 x daily - 7 x weekly - 1 sets - 2 reps - 30 sec hold - Supine Lower Trunk Rotation  - 1 x daily - 7 x weekly - 1 sets - 10 reps - Supine Posterior Pelvic Tilt  - 1 x daily - 7 x weekly - 2 sets - 10 reps - Supine 90/90 Alternating Heel Touches with Posterior Pelvic Tilt  - 1 x daily - 7 x weekly - 2 sets - 10 reps - Straight Leg Raise  - 1 x daily - 7 x weekly - 2 sets - 10 reps - Supine Dead Bug with Leg Extension  - 1 x daily - 7 x weekly - 2 sets - 10 repsAccess Code: [facility] URL: [email or web address] Date: 01/01/2022 Prepared by: Mikey Kirschner  Exercises - Supine Hip Internal and External Rotation  - 2 x daily - 7 x weekly - 15 reps - Supine Hamstring Stretch with Strap  - 1 x daily - 7 x weekly - 1 sets - 2 reps - 20 sec hold - Supine ITB Stretch with Strap  - 1 x daily - 7 x weekly - 1 sets - 2 reps - 30 sec hold - Supine Lower Trunk Rotation  - 1 x daily - 7 x weekly - 1 sets - 10 reps - Supine Posterior Pelvic Tilt  - 1 x daily - 7 x weekly - 2 sets - 10 reps - Supine 90/90 Alternating Heel Touches with Posterior Pelvic Tilt  - 1 x  daily - 7 x weekly - 2 sets - 10 repsAccess Code: [facility] URL: [email or web address] Date: 12/23/2021 Prepared by: [facility]   Exercises - Supine Hip Internal and External Rotation  - 2 x daily - 7 x weekly - 15 reps   ASSESSMENT:   CLINICAL IMPRESSION: Patient is progressing appropriately.   She is very diligent with her HEP.  She is able to do additional, more advanced core strengthening today without increased pain.  Her left knee ROM limitations likely cause her  asymmetrical gait and compensatory issues when doing tasks such as bending, stooping and squatting or sit to stand.  She is responding well to flexibility and core strengthening.  She would benefit from continued skilled PT for core stabilization and LE flexibility exercises.       OBJECTIVE IMPAIRMENTS Abnormal gait, decreased activity tolerance, decreased mobility, decreased ROM, decreased strength, hypomobility, increased fascial restrictions, increased muscle spasms, impaired flexibility, improper body mechanics, and pain.    ACTIVITY LIMITATIONS bending, standing, squatting, stairs, and bed mobility   PARTICIPATION LIMITATIONS: cleaning, laundry, community activity, yard work, and sewing   PERSONAL FACTORS Age, Past/current experiences, and 3 comorbidities: Lt knee ROM limitations post op complications, Lumbar surgery x2, hip arthritis are also affecting patient's functional outcome.  are also affecting patient's functional outcome.    REHAB POTENTIAL: Good   CLINICAL DECISION MAKING: Evolving/moderate complexity   EVALUATION COMPLEXITY: Moderate     GOALS: Goals reviewed with patient? Yes   SHORT TERM GOALS: Target date: 01/06/2022   Pt will be independent with her initial HEP to improve lumbar pain and LE strength.  Baseline: Goal status: INITIAL   2.  Pt will demo proper log roll technique with bed mobility during session, without the need for PT cuing.  Baseline:  Goal status: INITIAL           LONG TERM GOALS: Target date: 02/03/2022   Pt will have greater than 35 deg hip IR and ER bilaterally to improve her mechanics with walking and rotational activity.  Baseline: 20-30 deg rotation bilaterally, Lt worse than Rt  Goal status: INITIAL   2.  Pt will have improved trunk strength evident by her ability to get in/out of bed without increase in low back pain.  Baseline: painful Goal status: INITIAL   3.  Pt will be able to demonstrate proper mechanics with picking up  objects from the floor without excessive lumbar flexion compensation. Baseline:  Goal status: INITIAL   4.  Pt will report  being able to stand and complete mopping/sweeping activity for greater than 15 minutes without increase in low back pain.  Baseline:  Goal status: INITIAL   5.  Pt's FOTO score will increased to atleast 49 from start of PT. Baseline: 40 Goal status: INITIAL         PLAN: PT FREQUENCY: 1x/week   PT DURATION: 6 weeks   PLANNED INTERVENTIONS: Therapeutic exercises, Therapeutic activity, Neuromuscular re-education, Gait training, Patient/Family education, Self Care, Joint mobilization, Dry Needling, Spinal mobilization, Moist heat, Taping, Manual therapy, and Re-evaluation.   PLAN FOR NEXT SESSION: add to HEP; trunk strengthening including rotation, hip ER and IR rotation flexibility- need to consider Lt knee ROM limitations; scar mobilization   Mariyanna Mucha B. Davon Folta, PT 01/06/22 5:03 PM  [facility] [address] [place], [place] [address] Phone # [phone] Fax [phone]

## 2022-01-15 ENCOUNTER — Ambulatory Visit: Payer: Medicare Other | Attending: Neurosurgery

## 2022-01-15 DIAGNOSIS — M5459 Other low back pain: Secondary | ICD-10-CM | POA: Insufficient documentation

## 2022-01-15 DIAGNOSIS — R262 Difficulty in walking, not elsewhere classified: Secondary | ICD-10-CM | POA: Insufficient documentation

## 2022-01-15 DIAGNOSIS — M62830 Muscle spasm of back: Secondary | ICD-10-CM | POA: Diagnosis not present

## 2022-01-15 NOTE — Therapy (Signed)
OUTPATIENT PHYSICAL THERAPY TREATMENT NOTE   Patient Name: Lauren English MRN: [id number] DOB:[birth year], 81 y.o., female Today's Date: 01/15/2022  PCP: [name], MD REFERRING PROVIDER: Tressie Stalker, MD  END OF SESSION:   PT End of Session - 01/15/22 1537     Visit Number 4    Date for PT Re-Evaluation 02/13/22    Authorization Type Medicare    Authorization Time Period 12/23/21 to 02/13/22    PT Start Time 1530    PT Stop Time 1608    PT Time Calculation (min) 38 min    Activity Tolerance No increased pain;Patient tolerated treatment well    Behavior During Therapy [facility] for tasks assessed/performed             Past Medical History:  Diagnosis Date   Anemia    pt claims this was resolved   Arthritis    "qwhere; feet, knees, neck" (04/21/2012)   Bladder incontinence    Chronic back pain    Closed angle glaucoma    "both eyes" (04/21/2012)   Difficult intubation    with intubation on November 26, 2014. Glidescope and 4 noted in op notes. "Difficult Airway- due to reduced neck mobility and Difficult Airway- due to anterior larynx" also noted in anesthesia notes 11-2014   Numbness    fingertips bilat and feet bilat    OSA on CPAP    Overactive bladder    Peripheral neuropathy    feet bilat    Pneumonia    hx. of as child   PONV (postoperative nausea and vomiting)    Prediabetes    Urinary tract bacterial infections    pt states urologist has resolved this so far, but she is "prone to UTIs"   Past Surgical History:  Procedure Laterality Date   ABDOMINAL HYSTERECTOMY  1980   "partial" (04/21/2012)   ANTERIOR FUSION CERVICAL SPINE  12/2005   APPENDECTOMY     BLADDER SUSPENSION  1990   BREAST EXCISIONAL BIOPSY Bilateral    Benign   CHOLECYSTECTOMY N/A 10/17/2019   Procedure: LAPAROSCOPIC CHOLECYSTECTOMY;  Surgeon: Almond Lint, MD;  Location: [facility] OR;  Service: General;  Laterality: N/A;   DILATION AND CURETTAGE OF UTERUS  1980   EYE SURGERY Bilateral 2019    cataracts removed   INTRAOPERATIVE CHOLANGIOGRAM N/A 10/17/2019   Procedure: POSSIBLE INTRAOPERATIVE CHOLANGIOGRAM;  Surgeon: Almond Lint, MD;  Location: [facility] OR;  Service: General;  Laterality: N/A;   IRRIGATION AND DEBRIDEMENT KNEE Left 01/25/2017   Procedure: IRRIGATION AND DEBRIDEMENT KNEE;  Surgeon: Ollen Gross, MD;  Location: [facility] ORS;  Service: Orthopedics;  Laterality: Left;   KNEE ARTHROPLASTY Left 06/2008   "slipped on ice; torn ligaments & cartilage" (04/21/2012)   KNEE CLOSED REDUCTION Left 03/25/2015   Procedure: CLOSED MANIPULATION LEFT KNEE;  Surgeon: Ollen Gross, MD;  Location: [facility];  Service: Orthopedics;  Laterality: Left;   KNEE CLOSED REDUCTION Left 11/18/2015   Procedure: LEFT KNEE CLOSED MANIPULATION;  Surgeon: Ollen Gross, MD;  Location: [facility];  Service: Orthopedics;  Laterality: Left;   LUMBAR LAMINECTOMY/DECOMPRESSION MICRODISCECTOMY Bilateral 07/17/2021   Procedure: LUMBAR LAMINECTOMY/FORAMINOTOMY LUMBAR ONE-TWO , LUMBAR TWO- THREE, LUMBAR THREE- FOUR;  Surgeon: Tressie Stalker, MD;  Location: [facility] OR;  Service: Neurosurgery;  Laterality: Bilateral;   PATELLAR TENDON REPAIR Left 12/10/2014   Procedure: REPAIR PARTIAL QUAD TENDON TEAR;  Surgeon: Ollen Gross, MD;  Location: [facility];  Service: Orthopedics;  Laterality: Left;   POSTERIOR LUMBAR FUSION  04/21/2012   TONSILLECTOMY AND ADENOIDECTOMY  ~  1950   TOTAL KNEE ARTHROPLASTY Left 11/26/2014   Procedure: TOTAL LEFT   KNEE ARTHROPLASTY;  Surgeon: Ollen GrossFrank Aluisio, MD;  Location: [facility];  Service: Orthopedics;  Laterality: Left;   TOTAL KNEE ARTHROPLASTY Right 11/18/2015   Procedure: RIGHT TOTAL KNEE ARTHROPLASTY;  Surgeon: Ollen GrossFrank Aluisio, MD;  Location: [facility];  Service: Orthopedics;  Laterality: Right;   TUBAL LIGATION  1970's   Patient Active Problem List   Diagnosis Date Noted   Spinal stenosis of lumbar region with neurogenic claudication 07/17/2021   Chronic calculous cholecystitis 10/17/2019   Abscess of  knee, left 01/25/2017   Arthrofibrosis of total knee arthroplasty (HCC) 03/25/2015   Quadriceps tendon rupture 12/09/2014   OA (osteoarthritis) of knee 11/26/2014    REFERRING DIAG: Spinal Stenosis, Lumbar region with neurogenic claudication  THERAPY DIAG:  Other low back pain  Muscle spasm of back  Difficulty in walking, not elsewhere classified  Rationale for Evaluation and Treatment Rehabilitation  PERTINENT HISTORY: lumbar laminectomy 07-17-21 (fusion L4-L5 fusion and cervical fusion)  PRECAUTIONS: none  SUBJECTIVE: Patient states she is having some total body cramping/muscle aching in the past few days.  She is taking a statin x 1 year.  Also was prescribed Darrold JunkerUqora which is for UTI and has a "flush" component typical of a diuretic.     PAIN:  Are you having pain? Yes: NPRS scale: 0/10 Pain location: low back Pain description: mild ache Aggravating factors: prolonged standing Relieving factors: meds, exercises   OBJECTIVE: (objective measures completed at initial evaluation unless otherwise dated)   OBJECTIVE:    DIAGNOSTIC FINDINGS:      PATIENT SURVEYS:  FOTO 40   SCREENING FOR RED FLAGS: Bowel or bladder incontinence: No Spinal tumors: No Cauda equina syndrome: No Compression fracture: No Abdominal aneurysm: No   COGNITION:           Overall cognitive status: Within functional limits for tasks assessed                          SENSATION: WFL Not tested- pt denies numbness/tingling   MUSCLE LENGTH: Hamstrings: Right  deg; Left  deg Maisie Fushomas test: Right  deg; Left  deg   POSTURE: rounded shoulders and forward head   PALPATION: Scar adhesions noted lumbar spine from recent surgery, tenderness lumbar paraspinals   LUMBAR ROM:    Active  A/PROM  eval  Flexion WNL  Extension Limited 50% pain end range   Right lateral flexion    Left lateral flexion    Right rotation WNL  Left rotation WNL   (Blank rows = not tested)   LOWER EXTREMITY ROM:       Active  Right eval Left eval  Hip flexion      Hip extension      Hip abduction      Hip adduction      Hip internal rotation 25 20  Hip external rotation 30 20  Knee flexion      Knee extension      Ankle dorsiflexion      Ankle plantarflexion      Ankle inversion      Ankle eversion       (Blank rows = not tested)   LOWER EXTREMITY MMT:     MMT Right eval Left eval  Hip flexion 5 5  Hip extension 5 5  Hip abduction 5 5  Hip adduction      Hip internal rotation  Hip external rotation      Knee flexion      Knee extension      Ankle dorsiflexion      Ankle plantarflexion      Ankle inversion      Ankle eversion       (Blank rows = not tested)   LUMBAR SPECIAL TESTS:      FUNCTIONAL TESTS:  5 times sit to stand: 13 sec, Lt LE unable to make it under table   GAIT:   Comments: decreased hip extension, decreased trunk rotation        TODAY'S TREATMENT  01/15/22: Nustep x 5 min level 3 (PT present to discuss status) Lower trunk rotation x 20 PPT x 20 PPT with 90/90 heel tap x 20 PPT with SLR to plyo ball shoulder flexion (red plyo ball)  2 x 10 each LE PPT with dying bug unsupported x 20 Hook lying trunk rotation with plyo ball  Plank position at counter : Cat/camel x 10 Plank at counter x 10 sec, x 15 sec, x 20 sec (patient did not feel challenged- switched to edge of mat table)  01/06/22: Nustep x 5 min level 3 (PT present to discuss status) Supine hamstring stretch with strap 2 x 30 sec each both Supine IT band/ lateral hip stretch 3 x 20 sec both Lower trunk rotation x 20 PPT x 20 PPT with 90/90 heel tap x 20 PPT with SLR to plyo ball shoulder flexion (red plyo ball)  2 x 10 each LE PPT with dying bug unsupported x 20  01/01/22: Nustep x 5 min level 3 (PT present to discuss status) Supine hamstring stretch with strap 3 x 20 sec each both Supine IT band/ lateral hip stretch 3 x 20 sec both Lower trunk rotation x 20 PPT x 20 PPT with 90/90  heel tap x 20 Updated and reviewed HEP    PATIENT EDUCATION:  Education details: eval findings/POC Person educated: Patient Education method: Explanation, Demonstration, and Handouts Education comprehension: verbalized understanding and returned demonstration     HOME EXERCISE PROGRAM: Access Code: [facility] URL: [email or web address] Date: 01/06/2022 Prepared by: Mikey Kirschner  Exercises - Supine Hip Internal and External Rotation  - 2 x daily - 7 x weekly - 15 reps - Supine Hamstring Stretch with Strap  - 1 x daily - 7 x weekly - 1 sets - 2 reps - 20 sec hold - Supine ITB Stretch with Strap  - 1 x daily - 7 x weekly - 1 sets - 2 reps - 30 sec hold - Supine Lower Trunk Rotation  - 1 x daily - 7 x weekly - 1 sets - 10 reps - Supine Posterior Pelvic Tilt  - 1 x daily - 7 x weekly - 2 sets - 10 reps - Supine 90/90 Alternating Heel Touches with Posterior Pelvic Tilt  - 1 x daily - 7 x weekly - 2 sets - 10 reps - Straight Leg Raise  - 1 x daily - 7 x weekly - 2 sets - 10 reps - Supine Dead Bug with Leg Extension  - 1 x daily - 7 x weekly - 2 sets - 10 repsAccess Code: [facility] URL: [email or web address] Date: 01/01/2022 Prepared by: Mikey Kirschner  Exercises - Supine Hip Internal and External Rotation  - 2 x daily - 7 x weekly - 15 reps - Supine Hamstring Stretch with Strap  - 1 x daily - 7 x weekly - 1 sets - 2 reps - 20  sec hold - Supine ITB Stretch with Strap  - 1 x daily - 7 x weekly - 1 sets - 2 reps - 30 sec hold - Supine Lower Trunk Rotation  - 1 x daily - 7 x weekly - 1 sets - 10 reps - Supine Posterior Pelvic Tilt  - 1 x daily - 7 x weekly - 2 sets - 10 reps - Supine 90/90 Alternating Heel Touches with Posterior Pelvic Tilt  - 1 x daily - 7 x weekly - 2 sets - 10 repsAccess Code: [facility] URL: [email or web address] Date: 12/23/2021 Prepared by: [facility]   Exercises - Supine Hip  Internal and External Rotation  - 2 x daily - 7 x weekly - 15 reps   ASSESSMENT:   CLINICAL IMPRESSION: Miniya was able to complete all tasks in supine and hook lying today despite the "cramping".  We had planned on doing some quadruped exercises but patient states she is unable to get on her knees ever since her knee replacement. We did these activities in plank position.  She will discuss the cramping with her PCP and make sure she knows about the uqora supplement to be sure this wouldn't be causing the cramping.  She would benefit from continued skilled PT for core stabilization and LE flexibility exercises.       OBJECTIVE IMPAIRMENTS Abnormal gait, decreased activity tolerance, decreased mobility, decreased ROM, decreased strength, hypomobility, increased fascial restrictions, increased muscle spasms, impaired flexibility, improper body mechanics, and pain.    ACTIVITY LIMITATIONS bending, standing, squatting, stairs, and bed mobility   PARTICIPATION LIMITATIONS: cleaning, laundry, community activity, yard work, and sewing   PERSONAL FACTORS Age, Past/current experiences, and 3 comorbidities: Lt knee ROM limitations post op complications, Lumbar surgery x2, hip arthritis are also affecting patient's functional outcome.  are also affecting patient's functional outcome.    REHAB POTENTIAL: Good   CLINICAL DECISION MAKING: Evolving/moderate complexity   EVALUATION COMPLEXITY: Moderate     GOALS: Goals reviewed with patient? Yes   SHORT TERM GOALS: Target date: 01/06/2022   Pt will be independent with her initial HEP to improve lumbar pain and LE strength.  Baseline: Goal status: INITIAL   2.  Pt will demo proper log roll technique with bed mobility during session, without the need for PT cuing.  Baseline:  Goal status: INITIAL           LONG TERM GOALS: Target date: 02/03/2022   Pt will have greater than 35 deg hip IR and ER bilaterally to improve her mechanics with walking  and rotational activity.  Baseline: 20-30 deg rotation bilaterally, Lt worse than Rt  Goal status: INITIAL   2.  Pt will have improved trunk strength evident by her ability to get in/out of bed without increase in low back pain.  Baseline: painful Goal status: INITIAL   3.  Pt will be able to demonstrate proper mechanics with picking up objects from the floor without excessive lumbar flexion compensation. Baseline:  Goal status: INITIAL   4.  Pt will report  being able to stand and complete mopping/sweeping activity for greater than 15 minutes without increase in low back pain.  Baseline:  Goal status: INITIAL   5.  Pt's FOTO score will increased to atleast 49 from start of PT. Baseline: 40 Goal status: INITIAL         PLAN: PT FREQUENCY: 1x/week   PT DURATION: 6 weeks   PLANNED INTERVENTIONS: Therapeutic exercises, Therapeutic  activity, Neuromuscular re-education, Gait training, Patient/Family education, Self Care, Joint mobilization, Dry Needling, Spinal mobilization, Moist heat, Taping, Manual therapy, and Re-evaluation.   PLAN FOR NEXT SESSION: add to HEP; trunk strengthening including rotation, hip ER and IR rotation flexibility- need to consider Lt knee ROM limitations; scar mobilization   Quincey Quesinberry B. Safir Michalec, PT 01/15/22 4:21 PM  [facility] [address] [place], [place] [address] Phone # [phone] Fax [phone]

## 2022-01-19 DIAGNOSIS — U071 COVID-19: Secondary | ICD-10-CM | POA: Diagnosis not present

## 2022-01-20 ENCOUNTER — Encounter: Payer: Medicare Other | Admitting: Physical Therapy

## 2022-01-23 ENCOUNTER — Other Ambulatory Visit: Payer: Self-pay | Admitting: Family Medicine

## 2022-01-23 DIAGNOSIS — Z1231 Encounter for screening mammogram for malignant neoplasm of breast: Secondary | ICD-10-CM

## 2022-02-03 ENCOUNTER — Ambulatory Visit: Payer: Medicare Other | Admitting: Physical Therapy

## 2022-02-03 DIAGNOSIS — M5459 Other low back pain: Secondary | ICD-10-CM | POA: Diagnosis not present

## 2022-02-03 DIAGNOSIS — M62830 Muscle spasm of back: Secondary | ICD-10-CM | POA: Diagnosis not present

## 2022-02-03 DIAGNOSIS — R262 Difficulty in walking, not elsewhere classified: Secondary | ICD-10-CM

## 2022-02-03 NOTE — Therapy (Signed)
OUTPATIENT PHYSICAL THERAPY TREATMENT NOTE   Patient Name: Lauren English MRN: [id number] DOB:1940/10/02, 81 y.o., female Today's Date: 02/03/2022  PCP: [name], MD REFERRING PROVIDER: Tressie Stalker, MD  END OF SESSION:   PT End of Session - 02/03/22 1559     Visit Number 5    Date for PT Re-Evaluation 02/13/22    Authorization Type Medicare    Authorization Time Period 12/23/21 to 02/13/22    PT Start Time 1601    PT Stop Time 1645    PT Time Calculation (min) 44 min    Activity Tolerance Patient tolerated treatment well             Past Medical History:  Diagnosis Date   Anemia    pt claims this was resolved   Arthritis    "qwhere; feet, knees, neck" (04/21/2012)   Bladder incontinence    Chronic back pain    Closed angle glaucoma    "both eyes" (04/21/2012)   Difficult intubation    with intubation on November 26, 2014. Glidescope and 4 noted in op notes. "Difficult Airway- due to reduced neck mobility and Difficult Airway- due to anterior larynx" also noted in anesthesia notes 11-2014   Numbness    fingertips bilat and feet bilat    OSA on CPAP    Overactive bladder    Peripheral neuropathy    feet bilat    Pneumonia    hx. of as child   PONV (postoperative nausea and vomiting)    Prediabetes    Urinary tract bacterial infections    pt states urologist has resolved this so far, but she is "prone to UTIs"   Past Surgical History:  Procedure Laterality Date   ABDOMINAL HYSTERECTOMY  1980   "partial" (04/21/2012)   ANTERIOR FUSION CERVICAL SPINE  12/2005   APPENDECTOMY     BLADDER SUSPENSION  1990   BREAST EXCISIONAL BIOPSY Bilateral    Benign   CHOLECYSTECTOMY N/A 10/17/2019   Procedure: LAPAROSCOPIC CHOLECYSTECTOMY;  Surgeon: Almond Lint, MD;  Location: [facility] OR;  Service: General;  Laterality: N/A;   DILATION AND CURETTAGE OF UTERUS  1980   EYE SURGERY Bilateral 2019   cataracts removed   INTRAOPERATIVE CHOLANGIOGRAM N/A 10/17/2019    Procedure: POSSIBLE INTRAOPERATIVE CHOLANGIOGRAM;  Surgeon: Almond Lint, MD;  Location: [facility] OR;  Service: General;  Laterality: N/A;   IRRIGATION AND DEBRIDEMENT KNEE Left 01/25/2017   Procedure: IRRIGATION AND DEBRIDEMENT KNEE;  Surgeon: Ollen Gross, MD;  Location: [facility];  Service: Orthopedics;  Laterality: Left;   KNEE ARTHROPLASTY Left 06/2008   "slipped on ice; torn ligaments & cartilage" (04/21/2012)   KNEE CLOSED REDUCTION Left 03/25/2015   Procedure: CLOSED MANIPULATION LEFT KNEE;  Surgeon: Ollen Gross, MD;  Location: [facility];  Service: Orthopedics;  Laterality: Left;   KNEE CLOSED REDUCTION Left 11/18/2015   Procedure: LEFT KNEE CLOSED MANIPULATION;  Surgeon: Ollen Gross, MD;  Location: [facility];  Service: Orthopedics;  Laterality: Left;   LUMBAR LAMINECTOMY/DECOMPRESSION MICRODISCECTOMY Bilateral 07/17/2021   Procedure: LUMBAR LAMINECTOMY/FORAMINOTOMY LUMBAR ONE-TWO , LUMBAR TWO- THREE, LUMBAR THREE- FOUR;  Surgeon: Tressie Stalker, MD;  Location: [facility] OR;  Service: Neurosurgery;  Laterality: Bilateral;   PATELLAR TENDON REPAIR Left 12/10/2014   Procedure: REPAIR PARTIAL QUAD TENDON TEAR;  Surgeon: Ollen Gross, MD;  Location: [facility];  Service: Orthopedics;  Laterality: Left;   POSTERIOR LUMBAR FUSION  04/21/2012   TONSILLECTOMY AND ADENOIDECTOMY  ~ 1950   TOTAL KNEE ARTHROPLASTY Left 11/26/2014  Procedure: TOTAL LEFT   KNEE ARTHROPLASTY;  Surgeon: Gaynelle Arabian, MD;  Location: [facility];  Service: Orthopedics;  Laterality: Left;   TOTAL KNEE ARTHROPLASTY Right 11/18/2015   Procedure: RIGHT TOTAL KNEE ARTHROPLASTY;  Surgeon: Gaynelle Arabian, MD;  Location: [facility];  Service: Orthopedics;  Laterality: Right;   TUBAL LIGATION  1970's   Patient Active Problem List   Diagnosis Date Noted   Spinal stenosis of lumbar region with neurogenic claudication 07/17/2021   Chronic calculous cholecystitis 10/17/2019   Abscess of knee, left 01/25/2017   Arthrofibrosis of total knee arthroplasty  ([place]) 03/25/2015   Quadriceps tendon rupture 12/09/2014   OA (osteoarthritis) of knee 11/26/2014    REFERRING DIAG: Spinal Stenosis, Lumbar region with neurogenic claudication  THERAPY DIAG:  Other low back pain  Muscle spasm of back  Difficulty in walking, not elsewhere classified  Rationale for Evaluation and Treatment Rehabilitation  PERTINENT HISTORY: lumbar laminectomy 07-17-21 (fusion L4-L5 fusion and cervical fusion)  PRECAUTIONS: none  SUBJECTIVE: No more cramping since went off medication.  Will see PCP tomorrow.  The modified plank was wonderful, if I can find a place to do that it relieves the pain.  The pelvic tilt is very comforting.   Missing 2 weeks b/c of Covid caused fatigue.  I think I need to work on my hips.  I did rowing, chest press, squats, leg extension machine, rope pulls.  PAIN:  Are you having pain? Yes: NPRS scale: 2-3/10 Pain location: low back Pain description: mild ache Aggravating factors: prolonged standing Relieving factors: meds, exercises   OBJECTIVE: (objective measures completed at initial evaluation unless otherwise dated)   OBJECTIVE:    DIAGNOSTIC FINDINGS:      PATIENT SURVEYS:  FOTO 40   SCREENING FOR RED FLAGS: Bowel or bladder incontinence: No Spinal tumors: No Cauda equina syndrome: No Compression fracture: No Abdominal aneurysm: No   COGNITION:           Overall cognitive status: Within functional limits for tasks assessed                          SENSATION: WFL Not tested- pt denies numbness/tingling   MUSCLE LENGTH: Hamstrings: Right  deg; Left  deg Marcello Moores test: Right  deg; Left  deg   POSTURE: rounded shoulders and forward head   PALPATION: Scar adhesions noted lumbar spine from recent surgery, tenderness lumbar paraspinals   LUMBAR ROM:    Active  A/PROM  eval  Flexion WNL  Extension Limited 50% pain end range   Right lateral flexion    Left lateral flexion    Right rotation WNL  Left rotation  WNL   (Blank rows = not tested)   LOWER EXTREMITY ROM:      Active  Right eval Left eval  Hip flexion      Hip extension      Hip abduction      Hip adduction      Hip internal rotation 25 20  Hip external rotation 30 20  Knee flexion      Knee extension      Ankle dorsiflexion      Ankle plantarflexion      Ankle inversion      Ankle eversion       (Blank rows = not tested)   LOWER EXTREMITY MMT:     MMT Right eval Left eval  Hip flexion 5 5  Hip extension 5 5  Hip abduction  5 5  Hip adduction      Hip internal rotation      Hip external rotation      Knee flexion      Knee extension      Ankle dorsiflexion      Ankle plantarflexion      Ankle inversion      Ankle eversion       (Blank rows = not tested)   LUMBAR SPECIAL TESTS:      FUNCTIONAL TESTS:  5 times sit to stand: 13 sec, Lt LE unable to make it under table   GAIT:   Comments: decreased hip extension, decreased trunk rotation        TODAY'S TREATMENT  9/26: Nustep x 5 min level 5(PT present to discuss status) Lower trunk rotation x 10 PPT x 10 Supine clams green band bil 10x; single leg 10x each Supine green band heel slides and opposite arm elevation 15x Seated cat/camel 10x Planks at the counter 15 sec Standing Floor sliders lateral slides 10x Standing Floor sliders extension 10x Green band around thighs side stepping 5 laps Neuromuscular re-education: muscle activation with verbal and tactile cues to improve lumbo/pelvic/hip stability     PPT with 90/90 heel tap x 20 PPT with SLR to plyo ball shoulder flexion (red plyo ball)  2 x 10 each LE PPT with dying bug unsupported x 20 Hook lying trunk rotation with plyo ball  Plank position at counter : Cat/camel x 10 Plank at counter x 10 sec, x 15 sec, x 20 sec (patient did not feel challenged- switched to edge of mat table)     01/15/22: Nustep x 5 min level 3 (PT present to discuss status) Lower trunk rotation x 20 PPT x 20 PPT  with 90/90 heel tap x 20 PPT with SLR to plyo ball shoulder flexion (red plyo ball)  2 x 10 each LE PPT with dying bug unsupported x 20 Hook lying trunk rotation with plyo ball  Plank position at counter : Cat/camel x 10 Plank at counter x 10 sec, x 15 sec, x 20 sec (patient did not feel challenged- switched to edge of mat table)     PATIENT EDUCATION:  Education details: eval findings/POC Person educated: Patient Education method: Explanation, Demonstration, and Handouts Education comprehension: verbalized understanding and returned demonstration     HOME EXERCISE PROGRAM: Access Code: [facility] URL: [email or web address] Date: 02/03/2022 Prepared by: Lavinia Sharps  Exercises - Supine Hip Internal and External Rotation  - 2 x daily - 7 x weekly - 15 reps - Supine Hamstring Stretch with Strap  - 1 x daily - 7 x weekly - 1 sets - 2 reps - 20 sec hold - Supine ITB Stretch with Strap  - 1 x daily - 7 x weekly - 1 sets - 2 reps - 30 sec hold - Supine Lower Trunk Rotation  - 1 x daily - 7 x weekly - 1 sets - 10 reps - Supine Posterior Pelvic Tilt  - 1 x daily - 7 x weekly - 2 sets - 10 reps - Supine 90/90 Alternating Heel Touches with Posterior Pelvic Tilt  - 1 x daily - 7 x weekly - 2 sets - 10 reps - Straight Leg Raise  - 1 x daily - 7 x weekly - 2 sets - 10 reps - Supine Dead Bug with Leg Extension  - 1 x daily - 7 x weekly - 2 sets - 10 reps - Hooklying Clamshell with Resistance  -  1 x daily - 7 x weekly - 1 sets - 10 reps - Standing Hip Abduction on Slider  - 1 x daily - 7 x weekly - 1 sets - 10 reps - Side Stepping with Resistance at Thighs  - 1 x daily - 7 x weekly - 1 sets - 5 reps   ASSESSMENT:   CLINICAL IMPRESSION: Progression of HEP with addition of pelvic/hip strengthening in supine and standing positions.  Increased pain with supine initially but improves with time.  Good response to standing ex's especially with planks at the counter which provide pain  relief.  Therapist monitoring response to all interventions and modifying treatment accordingly.  Cues provided to optimize muscle activation.       OBJECTIVE IMPAIRMENTS Abnormal gait, decreased activity tolerance, decreased mobility, decreased ROM, decreased strength, hypomobility, increased fascial restrictions, increased muscle spasms, impaired flexibility, improper body mechanics, and pain.    ACTIVITY LIMITATIONS bending, standing, squatting, stairs, and bed mobility   PARTICIPATION LIMITATIONS: cleaning, laundry, community activity, yard work, and sewing   PERSONAL FACTORS Age, Past/current experiences, and 3 comorbidities: Lt knee ROM limitations post op complications, Lumbar surgery x2, hip arthritis are also affecting patient's functional outcome.  are also affecting patient's functional outcome.    REHAB POTENTIAL: Good   CLINICAL DECISION MAKING: Evolving/moderate complexity   EVALUATION COMPLEXITY: Moderate     GOALS: Goals reviewed with patient? Yes   SHORT TERM GOALS: Target date: 01/06/2022   Pt will be independent with her initial HEP to improve lumbar pain and LE strength.  Baseline: Goal status: INITIAL   2.  Pt will demo proper log roll technique with bed mobility during session, without the need for PT cuing.  Baseline:  Goal status: INITIAL           LONG TERM GOALS: Target date: 02/03/2022   Pt will have greater than 35 deg hip IR and ER bilaterally to improve her mechanics with walking and rotational activity.  Baseline: 20-30 deg rotation bilaterally, Lt worse than Rt  Goal status: INITIAL   2.  Pt will have improved trunk strength evident by her ability to get in/out of bed without increase in low back pain.  Baseline: painful Goal status: INITIAL   3.  Pt will be able to demonstrate proper mechanics with picking up objects from the floor without excessive lumbar flexion compensation. Baseline:  Goal status: INITIAL   4.  Pt will report  being  able to stand and complete mopping/sweeping activity for greater than 15 minutes without increase in low back pain.  Baseline:  Goal status: INITIAL   5.  Pt's FOTO score will increased to atleast 49 from start of PT. Baseline: 40 Goal status: INITIAL         PLAN: PT FREQUENCY: 1x/week   PT DURATION: 6 weeks   PLANNED INTERVENTIONS: Therapeutic exercises, Therapeutic activity, Neuromuscular re-education, Gait training, Patient/Family education, Self Care, Joint mobilization, Dry Needling, Spinal mobilization, Moist heat, Taping, Manual therapy, and Re-evaluation.   PLAN FOR NEXT SESSION: hip strengthening; trunk strengthening including rotation, try pallof series;  hip ER and IR rotation flexibility- need to consider Lt knee ROM limitations; scar mobilization   Lavinia Sharps, PT 02/03/22 4:45 PM Phone: [phone] Fax: [phone]  [facility] [address] [place], [place] [address] Phone # [phone] Fax [phone]

## 2022-02-04 DIAGNOSIS — M791 Myalgia, unspecified site: Secondary | ICD-10-CM | POA: Diagnosis not present

## 2022-02-04 DIAGNOSIS — U071 COVID-19: Secondary | ICD-10-CM | POA: Diagnosis not present

## 2022-02-04 DIAGNOSIS — E7800 Pure hypercholesterolemia, unspecified: Secondary | ICD-10-CM | POA: Diagnosis not present

## 2022-02-09 ENCOUNTER — Ambulatory Visit: Payer: Medicare Other | Attending: Neurosurgery

## 2022-02-09 DIAGNOSIS — M62830 Muscle spasm of back: Secondary | ICD-10-CM | POA: Insufficient documentation

## 2022-02-09 DIAGNOSIS — M5459 Other low back pain: Secondary | ICD-10-CM | POA: Insufficient documentation

## 2022-02-09 DIAGNOSIS — M6281 Muscle weakness (generalized): Secondary | ICD-10-CM | POA: Diagnosis not present

## 2022-02-09 DIAGNOSIS — R262 Difficulty in walking, not elsewhere classified: Secondary | ICD-10-CM | POA: Diagnosis not present

## 2022-02-09 NOTE — Therapy (Signed)
OUTPATIENT PHYSICAL THERAPY TREATMENT NOTE   Patient Name: Lauren English MRN: [id number] DOB:1940-11-28, 81 y.o., female Today's Date: 02/09/2022  PCP: [name], MD REFERRING PROVIDER: Tressie Stalker, MD  END OF SESSION:   PT End of Session - 02/09/22 1102     Visit Number 6    Date for PT Re-Evaluation 02/13/22    Authorization Type Medicare    Authorization Time Period 12/23/21 to 02/13/22    PT Start Time 1102    PT Stop Time 1150    PT Time Calculation (min) 48 min    Activity Tolerance Patient tolerated treatment well    Behavior During Therapy [facility] for tasks assessed/performed             Past Medical History:  Diagnosis Date   Anemia    pt claims this was resolved   Arthritis    "qwhere; feet, knees, neck" (04/21/2012)   Bladder incontinence    Chronic back pain    Closed angle glaucoma    "both eyes" (04/21/2012)   Difficult intubation    with intubation on November 26, 2014. Glidescope and 4 noted in op notes. "Difficult Airway- due to reduced neck mobility and Difficult Airway- due to anterior larynx" also noted in anesthesia notes 11-2014   Numbness    fingertips bilat and feet bilat    OSA on CPAP    Overactive bladder    Peripheral neuropathy    feet bilat    Pneumonia    hx. of as child   PONV (postoperative nausea and vomiting)    Prediabetes    Urinary tract bacterial infections    pt states urologist has resolved this so far, but she is "prone to UTIs"   Past Surgical History:  Procedure Laterality Date   ABDOMINAL HYSTERECTOMY  1980   "partial" (04/21/2012)   ANTERIOR FUSION CERVICAL SPINE  12/2005   APPENDECTOMY     BLADDER SUSPENSION  1990   BREAST EXCISIONAL BIOPSY Bilateral    Benign   CHOLECYSTECTOMY N/A 10/17/2019   Procedure: LAPAROSCOPIC CHOLECYSTECTOMY;  Surgeon: Almond Lint, MD;  Location: [facility] OR;  Service: General;  Laterality: N/A;   DILATION AND CURETTAGE OF UTERUS  1980   EYE SURGERY Bilateral 2019   cataracts  removed   INTRAOPERATIVE CHOLANGIOGRAM N/A 10/17/2019   Procedure: POSSIBLE INTRAOPERATIVE CHOLANGIOGRAM;  Surgeon: Almond Lint, MD;  Location: [facility] OR;  Service: General;  Laterality: N/A;   IRRIGATION AND DEBRIDEMENT KNEE Left 01/25/2017   Procedure: IRRIGATION AND DEBRIDEMENT KNEE;  Surgeon: Ollen Gross, MD;  Location: [facility] ORS;  Service: Orthopedics;  Laterality: Left;   KNEE ARTHROPLASTY Left 06/2008   "slipped on ice; torn ligaments & cartilage" (04/21/2012)   KNEE CLOSED REDUCTION Left 03/25/2015   Procedure: CLOSED MANIPULATION LEFT KNEE;  Surgeon: Ollen Gross, MD;  Location: [facility];  Service: Orthopedics;  Laterality: Left;   KNEE CLOSED REDUCTION Left 11/18/2015   Procedure: LEFT KNEE CLOSED MANIPULATION;  Surgeon: Ollen Gross, MD;  Location: [facility];  Service: Orthopedics;  Laterality: Left;   LUMBAR LAMINECTOMY/DECOMPRESSION MICRODISCECTOMY Bilateral 07/17/2021   Procedure: LUMBAR LAMINECTOMY/FORAMINOTOMY LUMBAR ONE-TWO , LUMBAR TWO- THREE, LUMBAR THREE- FOUR;  Surgeon: Tressie Stalker, MD;  Location: [facility] OR;  Service: Neurosurgery;  Laterality: Bilateral;   PATELLAR TENDON REPAIR Left 12/10/2014   Procedure: REPAIR PARTIAL QUAD TENDON TEAR;  Surgeon: Ollen Gross, MD;  Location: [facility];  Service: Orthopedics;  Laterality: Left;   POSTERIOR LUMBAR FUSION  04/21/2012   TONSILLECTOMY AND ADENOIDECTOMY  ~  1950   TOTAL KNEE ARTHROPLASTY Left 11/26/2014   Procedure: TOTAL LEFT   KNEE ARTHROPLASTY;  Surgeon: Ollen Gross, MD;  Location: [facility];  Service: Orthopedics;  Laterality: Left;   TOTAL KNEE ARTHROPLASTY Right 11/18/2015   Procedure: RIGHT TOTAL KNEE ARTHROPLASTY;  Surgeon: Ollen Gross, MD;  Location: [facility];  Service: Orthopedics;  Laterality: Right;   TUBAL LIGATION  1970's   Patient Active Problem List   Diagnosis Date Noted   Spinal stenosis of lumbar region with neurogenic claudication 07/17/2021   Chronic calculous cholecystitis 10/17/2019   Abscess of knee, left  01/25/2017   Arthrofibrosis of total knee arthroplasty (HCC) 03/25/2015   Quadriceps tendon rupture 12/09/2014   OA (osteoarthritis) of knee 11/26/2014    REFERRING DIAG: Spinal Stenosis, Lumbar region with neurogenic claudication  THERAPY DIAG:  Other low back pain  Muscle spasm of back  Difficulty in walking, not elsewhere classified  Muscle weakness (generalized)  Rationale for Evaluation and Treatment Rehabilitation  PERTINENT HISTORY: lumbar laminectomy 07-17-21 (fusion L4-L5 fusion and cervical fusion)  PRECAUTIONS: none  SUBJECTIVE: Patient states she still has some "bad days" but these seem to be less frequent.    PAIN:  Are you having pain? Yes: NPRS scale: 2-3/10 Pain location: low back Pain description: mild ache Aggravating factors: prolonged standing Relieving factors: meds, exercises   OBJECTIVE: (objective measures completed at initial evaluation unless otherwise dated)   OBJECTIVE:    DIAGNOSTIC FINDINGS:      PATIENT SURVEYS:  FOTO 40   SCREENING FOR RED FLAGS: Bowel or bladder incontinence: No Spinal tumors: No Cauda equina syndrome: No Compression fracture: No Abdominal aneurysm: No   COGNITION:           Overall cognitive status: Within functional limits for tasks assessed                          SENSATION: WFL Not tested- pt denies numbness/tingling   MUSCLE LENGTH: Hamstrings: Right  deg; Left  deg Maisie Fus test: Right  deg; Left  deg   POSTURE: rounded shoulders and forward head   PALPATION: Scar adhesions noted lumbar spine from recent surgery, tenderness lumbar paraspinals   LUMBAR ROM:    Active  A/PROM  eval  Flexion WNL  Extension Limited 50% pain end range   Right lateral flexion    Left lateral flexion    Right rotation WNL  Left rotation WNL   (Blank rows = not tested)   LOWER EXTREMITY ROM:      Active  Right eval Left eval  Hip flexion      Hip extension      Hip abduction      Hip adduction      Hip  internal rotation 25 20  Hip external rotation 30 20  Knee flexion      Knee extension      Ankle dorsiflexion      Ankle plantarflexion      Ankle inversion      Ankle eversion       (Blank rows = not tested)   LOWER EXTREMITY MMT:     MMT Right eval Left eval  Hip flexion 5 5  Hip extension 5 5  Hip abduction 5 5  Hip adduction      Hip internal rotation      Hip external rotation      Knee flexion      Knee extension  Ankle dorsiflexion      Ankle plantarflexion      Ankle inversion      Ankle eversion       (Blank rows = not tested)   LUMBAR SPECIAL TESTS:      FUNCTIONAL TESTS:  5 times sit to stand: 13 sec, Lt LE unable to make it under table   GAIT:   Comments: decreased hip extension, decreased trunk rotation        TODAY'S TREATMENT  10/2: Nustep x 5 min level 5(PT present to discuss status) Planks at the counter 30 sec x 3 reps Green band around ankles side stepping 3 laps (10 steps) Standing Floor sliders lateral slides 2 x 10 each LE Standing Floor sliders extension 2 x 10 each LE Plank position at counter : Cat/camel x 10 Lower trunk rotation x 10 PPT x 10 PPT with 90/90 heel tap x 20 PPT with SLR to plyo ball shoulder flexion (red plyo ball)  2 x 10 each LE PPT with dying bug unsupported x 20 Hook lying trunk rotation with plyo ball x 10 each side  9/26: Nustep x 5 min level 5(PT present to discuss status) Lower trunk rotation x 10 PPT x 10 Supine clams green band bil 10x; single leg 10x each Supine green band heel slides and opposite arm elevation 15x Seated cat/camel 10x Planks at the counter 15 sec Standing Floor sliders lateral slides 10x Standing Floor sliders extension 10x Green band around thighs side stepping 5 laps Neuromuscular re-education: muscle activation with verbal and tactile cues to improve lumbo/pelvic/hip stability PPT with 90/90 heel tap x 20 PPT with SLR to plyo ball shoulder flexion (red plyo ball)  2 x 10  each LE PPT with dying bug unsupported x 20 Hook lying trunk rotation with plyo ball  Plank position at counter : Cat/camel x 10 Plank at counter x 10 sec, x 15 sec, x 20 sec (patient did not feel challenged- switched to edge of mat table)  01/15/22: Nustep x 5 min level 3 (PT present to discuss status) Lower trunk rotation x 20 PPT x 20 PPT with 90/90 heel tap x 20 PPT with SLR to plyo ball shoulder flexion (red plyo ball)  2 x 10 each LE PPT with dying bug unsupported x 20 Hook lying trunk rotation with plyo ball  Plank position at counter : Cat/camel x 10 Plank at counter x 10 sec, x 15 sec, x 20 sec (patient did not feel challenged- switched to edge of mat table)     PATIENT EDUCATION:  Education details: eval findings/POC Person educated: Patient Education method: Explanation, Demonstration, and Handouts Education comprehension: verbalized understanding and returned demonstration     HOME EXERCISE PROGRAM: Access Code: [facility] URL: [email or web address] Date: 02/03/2022 Prepared by: Lavinia Sharps  Exercises - Supine Hip Internal and External Rotation  - 2 x daily - 7 x weekly - 15 reps - Supine Hamstring Stretch with Strap  - 1 x daily - 7 x weekly - 1 sets - 2 reps - 20 sec hold - Supine ITB Stretch with Strap  - 1 x daily - 7 x weekly - 1 sets - 2 reps - 30 sec hold - Supine Lower Trunk Rotation  - 1 x daily - 7 x weekly - 1 sets - 10 reps - Supine Posterior Pelvic Tilt  - 1 x daily - 7 x weekly - 2 sets - 10 reps - Supine 90/90 Alternating Heel Touches with Posterior Pelvic Tilt  -  1 x daily - 7 x weekly - 2 sets - 10 reps - Straight Leg Raise  - 1 x daily - 7 x weekly - 2 sets - 10 reps - Supine Dead Bug with Leg Extension  - 1 x daily - 7 x weekly - 2 sets - 10 reps - Hooklying Clamshell with Resistance  - 1 x daily - 7 x weekly - 1 sets - 10 reps - Standing Hip Abduction on Slider  - 1 x daily - 7 x weekly - 1 sets - 10 reps - Side Stepping with  Resistance at Thighs  - 1 x daily - 7 x weekly - 1 sets - 5 reps   ASSESSMENT:   CLINICAL IMPRESSION: Patient is progressing appropriately.  She is able to tolerate a fairly high level core strengthening program.  She is well motivated and compliant.  Re-assessment due next visit.  She has follow up scheduled with referring MD on 03-20-22.  We will likely extend her therapy through that date.  She would benefit from skilled PT to meet her goals of routine daily activity with min need for rest.  Also wants to avoid surgery.        OBJECTIVE IMPAIRMENTS Abnormal gait, decreased activity tolerance, decreased mobility, decreased ROM, decreased strength, hypomobility, increased fascial restrictions, increased muscle spasms, impaired flexibility, improper body mechanics, and pain.    ACTIVITY LIMITATIONS bending, standing, squatting, stairs, and bed mobility   PARTICIPATION LIMITATIONS: cleaning, laundry, community activity, yard work, and sewing   PERSONAL FACTORS Age, Past/current experiences, and 3 comorbidities: Lt knee ROM limitations post op complications, Lumbar surgery x2, hip arthritis are also affecting patient's functional outcome.  are also affecting patient's functional outcome.    REHAB POTENTIAL: Good   CLINICAL DECISION MAKING: Evolving/moderate complexity   EVALUATION COMPLEXITY: Moderate     GOALS: Goals reviewed with patient? Yes   SHORT TERM GOALS: Target date: 01/06/2022   Pt will be independent with her initial HEP to improve lumbar pain and LE strength.  Baseline: Goal status: INITIAL   2.  Pt will demo proper log roll technique with bed mobility during session, without the need for PT cuing.  Baseline:  Goal status: INITIAL           LONG TERM GOALS: Target date: 02/03/2022   Pt will have greater than 35 deg hip IR and ER bilaterally to improve her mechanics with walking and rotational activity.  Baseline: 20-30 deg rotation bilaterally, Lt worse than Rt   Goal status: INITIAL   2.  Pt will have improved trunk strength evident by her ability to get in/out of bed without increase in low back pain.  Baseline: painful Goal status: INITIAL   3.  Pt will be able to demonstrate proper mechanics with picking up objects from the floor without excessive lumbar flexion compensation. Baseline:  Goal status: INITIAL   4.  Pt will report  being able to stand and complete mopping/sweeping activity for greater than 15 minutes without increase in low back pain.  Baseline:  Goal status: INITIAL   5.  Pt's FOTO score will increased to atleast 49 from start of PT. Baseline: 40 Goal status: INITIAL         PLAN: PT FREQUENCY: 1x/week   PT DURATION: 6 weeks   PLANNED INTERVENTIONS: Therapeutic exercises, Therapeutic activity, Neuromuscular re-education, Gait training, Patient/Family education, Self Care, Joint mobilization, Dry Needling, Spinal mobilization, Moist heat, Taping, Manual therapy, and Re-evaluation.   PLAN FOR NEXT  SESSION: hip strengthening; trunk strengthening including rotation, try pallof series;  hip ER and IR rotation flexibility- need to consider Lt knee ROM limitations; scar mobilization   Kassidy Dockendorf B. Demika Langenderfer, PT 02/09/22 11:52 AM  Phone: [phone] Fax: [phone]  [facility] [address] [place], [place] [address] Phone # [phone] Fax [phone]

## 2022-02-10 ENCOUNTER — Ambulatory Visit: Payer: Medicare Other | Admitting: Physical Therapy

## 2022-02-12 ENCOUNTER — Ambulatory Visit: Payer: Medicare Other

## 2022-02-12 ENCOUNTER — Ambulatory Visit
Admission: RE | Admit: 2022-02-12 | Discharge: 2022-02-12 | Disposition: A | Discharge status: Home / Self Care | Payer: Medicare Other | Source: Ambulatory Visit | Attending: Family Medicine | Admitting: Family Medicine

## 2022-02-12 DIAGNOSIS — Z1231 Encounter for screening mammogram for malignant neoplasm of breast: Secondary | ICD-10-CM | POA: Diagnosis not present

## 2022-02-13 ENCOUNTER — Ambulatory Visit: Payer: Medicare Other

## 2022-02-13 DIAGNOSIS — M62830 Muscle spasm of back: Secondary | ICD-10-CM | POA: Diagnosis not present

## 2022-02-13 DIAGNOSIS — R262 Difficulty in walking, not elsewhere classified: Secondary | ICD-10-CM

## 2022-02-13 DIAGNOSIS — M5459 Other low back pain: Secondary | ICD-10-CM | POA: Diagnosis not present

## 2022-02-13 DIAGNOSIS — M6281 Muscle weakness (generalized): Secondary | ICD-10-CM

## 2022-02-13 NOTE — Therapy (Signed)
OUTPATIENT PHYSICAL THERAPY TREATMENT NOTE   Patient Name: Lauren English MRN: [id number] DOB:07-15-1940, 81 y.o., female Today's Date: 02/13/2022  PCP: Jonathon Jordan, MD REFERRING PROVIDER: Newman Pies, MD  END OF SESSION:   PT End of Session - 02/13/22 0851     Visit Number 7    Date for PT Re-Evaluation 03/12/22    Authorization Type Medicare    Authorization Time Period 02/13/22 through 03/12/22    PT Start Time 0848    PT Stop Time 0933    PT Time Calculation (min) 45 min    Activity Tolerance Patient tolerated treatment well    Behavior During Therapy [facility] for tasks assessed/performed             Past Medical History:  Diagnosis Date   Anemia    pt claims this was resolved   Arthritis    "qwhere; feet, knees, neck" (04/21/2012)   Bladder incontinence    Chronic back pain    Closed angle glaucoma    "both eyes" (04/21/2012)   Difficult intubation    with intubation on November 26, 2014. Glidescope and 4 noted in op notes. "Difficult Airway- due to reduced neck mobility and Difficult Airway- due to anterior larynx" also noted in anesthesia notes 11-2014   Numbness    fingertips bilat and feet bilat    OSA on CPAP    Overactive bladder    Peripheral neuropathy    feet bilat    Pneumonia    hx. of as child   PONV (postoperative nausea and vomiting)    Prediabetes    Urinary tract bacterial infections    pt states urologist has resolved this so far, but she is "prone to UTIs"   Past Surgical History:  Procedure Laterality Date   ABDOMINAL HYSTERECTOMY  1980   "partial" (04/21/2012)   ANTERIOR FUSION CERVICAL SPINE  12/2005   APPENDECTOMY     BLADDER SUSPENSION  1990   BREAST EXCISIONAL BIOPSY Bilateral    Benign   CHOLECYSTECTOMY N/A 10/17/2019   Procedure: LAPAROSCOPIC CHOLECYSTECTOMY;  Surgeon: Stark Klein, MD;  Location: [place];  Service: General;  Laterality: N/A;   [place] Bilateral 2019   cataracts  removed   INTRAOPERATIVE CHOLANGIOGRAM N/A 10/17/2019   Procedure: POSSIBLE INTRAOPERATIVE CHOLANGIOGRAM;  Surgeon: Stark Klein, MD;  Location: [place];  Service: General;  Laterality: N/A;   IRRIGATION AND DEBRIDEMENT KNEE Left 01/25/2017   Procedure: IRRIGATION AND DEBRIDEMENT KNEE;  Surgeon: Gaynelle Arabian, MD;  Location: [facility];  Service: Orthopedics;  Laterality: Left;   KNEE ARTHROPLASTY Left 06/2008   "slipped on ice; torn ligaments & cartilage" (04/21/2012)   KNEE CLOSED REDUCTION Left 03/25/2015   Procedure: CLOSED MANIPULATION LEFT KNEE;  Surgeon: Gaynelle Arabian, MD;  Location: [facility];  Service: Orthopedics;  Laterality: Left;   KNEE CLOSED REDUCTION Left 11/18/2015   Procedure: LEFT KNEE CLOSED MANIPULATION;  Surgeon: Gaynelle Arabian, MD;  Location: [facility];  Service: Orthopedics;  Laterality: Left;   LUMBAR LAMINECTOMY/DECOMPRESSION MICRODISCECTOMY Bilateral 07/17/2021   Procedure: LUMBAR LAMINECTOMY/FORAMINOTOMY LUMBAR ONE-TWO , LUMBAR TWO- THREE, LUMBAR THREE- FOUR;  Surgeon: Newman Pies, MD;  Location: [place];  Service: Neurosurgery;  Laterality: Bilateral;   PATELLAR TENDON REPAIR Left 12/10/2014   Procedure: REPAIR PARTIAL QUAD TENDON TEAR;  Surgeon: Gaynelle Arabian, MD;  Location: [facility];  Service: Orthopedics;  Laterality: Left;   POSTERIOR LUMBAR FUSION  04/21/2012   TONSILLECTOMY AND ADENOIDECTOMY  ~  1950   TOTAL KNEE ARTHROPLASTY Left 11/26/2014   Procedure: TOTAL LEFT   KNEE ARTHROPLASTY;  Surgeon: Gaynelle Arabian, MD;  Location: [facility];  Service: Orthopedics;  Laterality: Left;   TOTAL KNEE ARTHROPLASTY Right 11/18/2015   Procedure: RIGHT TOTAL KNEE ARTHROPLASTY;  Surgeon: Gaynelle Arabian, MD;  Location: [facility];  Service: Orthopedics;  Laterality: Right;   TUBAL LIGATION  1970's   Patient Active Problem List   Diagnosis Date Noted   Spinal stenosis of lumbar region with neurogenic claudication 07/17/2021   Chronic calculous cholecystitis 10/17/2019   Abscess of knee, left  01/25/2017   Arthrofibrosis of total knee arthroplasty ([employer]) 03/25/2015   Quadriceps tendon rupture 12/09/2014   OA (osteoarthritis) of knee 11/26/2014    REFERRING DIAG: Spinal Stenosis, Lumbar region with neurogenic claudication  THERAPY DIAG:  Other low back pain  Muscle spasm of back  Muscle weakness (generalized)  Difficulty in walking, not elsewhere classified  Rationale for Evaluation and Treatment Rehabilitation  PERTINENT HISTORY: lumbar laminectomy 07-17-21 (fusion L4-L5 fusion and cervical fusion)  PRECAUTIONS: none  SUBJECTIVE: Patient states she is about the same with regard to her ability to do housework but feels she is finding certain exercises are very helpful at allowing her to stay upright and she feels she is able to be up longer if she can get into a plank position for a few reps and not have to sit immediately when her back is hurting.  She will be seeing MD November 10 for f/u.     PAIN:  Are you having pain? Yes: NPRS scale: 2-3/10 Pain location: low back Pain description: mild ache Aggravating factors: prolonged standing Relieving factors: meds, exercises   OBJECTIVE: (objective measures completed at initial evaluation unless otherwise dated)   OBJECTIVE:    DIAGNOSTIC FINDINGS:      PATIENT SURVEYS:  Initial eval: FOTO 40  02/13/22: FOTO  53    SCREENING FOR RED FLAGS: Bowel or bladder incontinence: No Spinal tumors: No Cauda equina syndrome: No Compression fracture: No Abdominal aneurysm: No   COGNITION:           Overall cognitive status: Within functional limits for tasks assessed                          SENSATION: WFL Not tested- pt denies numbness/tingling   MUSCLE LENGTH: Hamstrings: Right  deg; Left  deg Marcello Moores test: Right  deg; Left  deg   POSTURE: rounded shoulders and forward head   PALPATION: Scar adhesions noted lumbar spine from recent surgery, tenderness lumbar paraspinals   LUMBAR ROM:    Active  A/PROM   eval AROM 02/13/22  Flexion WNL   Extension Limited 50% pain end range  Limited approx 25% with minimal pain  Right lateral flexion     Left lateral flexion     Right rotation WNL   Left rotation WNL    (Blank rows = not tested)   LOWER EXTREMITY ROM:      Active  Right eval Right 02/13/22 Left eval Left  106/23  Hip flexion        Hip extension        Hip abduction        Hip adduction        Hip internal rotation 25 33 20 28  Hip external rotation _0 Knee flexion        Knee extension  Ankle dorsiflexion        Ankle plantarflexion        Ankle inversion        Ankle eversion         (Blank rows = not tested)   LOWER EXTREMITY MMT:     MMT Right eval Left eval  Hip flexion 5 5  Hip extension 5 5  Hip abduction 5 5  Hip adduction      Hip internal rotation      Hip external rotation      Knee flexion      Knee extension      Ankle dorsiflexion      Ankle plantarflexion      Ankle inversion      Ankle eversion       (Blank rows = not tested)   LUMBAR SPECIAL TESTS:      FUNCTIONAL TESTS:  Initial eval:  5 times sit to stand: 13 sec, Lt LE unable to make it under table  02/13/22: 12.98 sec    GAIT:   Comments: decreased hip extension, decreased trunk rotation        TODAY'S TREATMENT  10/2: Re-assessment Lower trunk rotation x 20 PPT x 20 PPT with 90/90 heel tap x 20 PPT with SLR to plyo ball shoulder flexion (red plyo ball)  2 x 10 each LE PPT with dying bug unsupported x 20 Hook lying trunk rotation with plyo ball x 10 each side  9/26: Nustep x 5 min level 5(PT present to discuss status) Lower trunk rotation x 10 PPT x 10 Supine clams green band bil 10x; single leg 10x each Supine green band heel slides and opposite arm elevation 15x Seated cat/camel 10x Planks at the counter 15 sec Standing Floor sliders lateral slides 10x Standing Floor sliders extension 10x Green band around thighs side stepping 5  laps Neuromuscular re-education: muscle activation with verbal and tactile cues to improve lumbo/pelvic/hip stability PPT with 90/90 heel tap x 20 PPT with SLR to plyo ball shoulder flexion (red plyo ball)  2 x 10 each LE PPT with dying bug unsupported x 20 Hook lying trunk rotation with plyo ball  Plank position at counter : Cat/camel x 10 Plank at counter x 10 sec, x 15 sec, x 20 sec (patient did not feel challenged- switched to edge of mat table)  01/15/22: Nustep x 5 min level 3 (PT present to discuss status) Lower trunk rotation x 20 PPT x 20 PPT with 90/90 heel tap x 20 PPT with SLR to plyo ball shoulder flexion (red plyo ball)  2 x 10 each LE PPT with dying bug unsupported x 20 Hook lying trunk rotation with plyo ball  Plank position at counter : Cat/camel x 10 Plank at counter x 10 sec, x 15 sec, x 20 sec (patient did not feel challenged- switched to edge of mat table)     PATIENT EDUCATION:  Education details: eval findings/POC Person educated: Patient Education method: Explanation, Demonstration, and Handouts Education comprehension: verbalized understanding and returned demonstration     HOME EXERCISE PROGRAM: Access Code: [facility] URL: [email or web address] Date: 02/03/2022 Prepared by: Ruben Im  Exercises - Supine Hip Internal and External Rotation  - 2 x daily - 7 x weekly - 15 reps - Supine Hamstring Stretch with Strap  - 1 x daily - 7 x weekly - 1 sets - 2 reps - 20 sec hold - Supine ITB Stretch with Strap  - 1 x daily - 7  x weekly - 1 sets - 2 reps - 30 sec hold - Supine Lower Trunk Rotation  - 1 x daily - 7 x weekly - 1 sets - 10 reps - Supine Posterior Pelvic Tilt  - 1 x daily - 7 x weekly - 2 sets - 10 reps - Supine 90/90 Alternating Heel Touches with Posterior Pelvic Tilt  - 1 x daily - 7 x weekly - 2 sets - 10 reps - Straight Leg Raise  - 1 x daily - 7 x weekly - 2 sets - 10 reps - Supine Dead Bug with Leg Extension  - 1 x daily - 7 x  weekly - 2 sets - 10 reps - Hooklying Clamshell with Resistance  - 1 x daily - 7 x weekly - 1 sets - 10 reps - Standing Hip Abduction on Slider  - 1 x daily - 7 x weekly - 1 sets - 10 reps - Side Stepping with Resistance at Thighs  - 1 x daily - 7 x weekly - 1 sets - 5 reps   ASSESSMENT:   CLINICAL IMPRESSION: Lauren English is progressing slowly but shows definite improvement in her objective findings and self assessment of function.   She has follow up scheduled with referring MD on 03-20-22.  She is very well motivated and compliant.  We will extend her therapy through that date.  She would benefit from skilled PT to meet her goals of routine daily activity with min need for rest.  Also wants to avoid surgery.        OBJECTIVE IMPAIRMENTS Abnormal gait, decreased activity tolerance, decreased mobility, decreased ROM, decreased strength, hypomobility, increased fascial restrictions, increased muscle spasms, impaired flexibility, improper body mechanics, and pain.    ACTIVITY LIMITATIONS bending, standing, squatting, stairs, and bed mobility   PARTICIPATION LIMITATIONS: cleaning, laundry, community activity, yard work, and Caroline Age, Past/current experiences, and 3 comorbidities: Lt knee ROM limitations post op complications, Lumbar surgery x2, hip arthritis are also affecting patient's functional outcome.  are also affecting patient's functional outcome.    REHAB POTENTIAL: Good   CLINICAL DECISION MAKING: Evolving/moderate complexity   EVALUATION COMPLEXITY: Moderate     GOALS: Goals reviewed with patient? Yes   SHORT TERM GOALS: Target date: 01/06/2022   Pt will be independent with her initial HEP to improve lumbar pain and LE strength.  Baseline: Goal status: MET   2.  Pt will demo proper log roll technique with bed mobility during session, without the need for PT cuing.  Baseline:  Goal status: MET           LONG TERM GOALS: Target date: 03/12/22   Pt will  have greater than 35 deg hip IR and ER bilaterally to improve her mechanics with walking and rotational activity.  Baseline: 20-30 deg rotation bilaterally, Lt worse than Rt  Goal status: PROGRESSING   2.  Pt will have improved trunk strength evident by her ability to get in/out of bed without increase in low back pain.  Baseline: painful Goal status: PROGRESSING   3.  Pt will be able to demonstrate proper mechanics with picking up objects from the floor without excessive lumbar flexion compensation. Baseline:  Goal status: PROGRESSING   4.  Pt will report  being able to stand and complete mopping/sweeping activity for greater than 15 minutes without increase in low back pain.  Baseline:  Goal status: PROGRESSING   5.  Pt's FOTO score will increased to atleast 49 from  start of PT. Baseline: 40 Goal status: MET         PLAN: PT FREQUENCY: 1x/week   PT DURATION: until f/u with MD on 11/10.  Recert 11-14-78 through 03/12/22   PLANNED INTERVENTIONS: Therapeutic exercises, Therapeutic activity, Neuromuscular re-education, Gait training, Patient/Family education, Self Care, Joint mobilization, Dry Needling, Spinal mobilization, Moist heat, Taping, Manual therapy, and Re-evaluation.   PLAN FOR NEXT SESSION: hip strengthening; trunk strengthening including rotation, try pallof series;  hip ER and IR rotation flexibility- need to consider Lt knee ROM limitations; scar mobilization   Desmond Tufano B. Elon Lomeli, PT 02/13/22 9:49 AM  Phone: [phone] Fax: [phone]  [facility] [address], [address] [place], [place] [address] Phone # [phone] Fax [phone]

## 2022-02-18 ENCOUNTER — Ambulatory Visit: Payer: Medicare Other

## 2022-02-18 DIAGNOSIS — R262 Difficulty in walking, not elsewhere classified: Secondary | ICD-10-CM | POA: Diagnosis not present

## 2022-02-18 DIAGNOSIS — M6281 Muscle weakness (generalized): Secondary | ICD-10-CM

## 2022-02-18 DIAGNOSIS — M62830 Muscle spasm of back: Secondary | ICD-10-CM | POA: Diagnosis not present

## 2022-02-18 DIAGNOSIS — M5459 Other low back pain: Secondary | ICD-10-CM | POA: Diagnosis not present

## 2022-02-18 NOTE — Therapy (Signed)
OUTPATIENT PHYSICAL THERAPY TREATMENT NOTE   Patient Name: Lauren English MRN: [id number] DOB:25-Aug-1940, 81 y.o., female Today's Date: 02/18/2022  PCP: Jonathon Jordan, MD REFERRING PROVIDER: Newman Pies, MD  END OF SESSION:   PT End of Session - 02/18/22 0852     Visit Number 8    Date for PT Re-Evaluation 03/12/22    Authorization Type Medicare    Authorization Time Period 02/13/22 through 03/12/22    PT Start Time 0850    PT Stop Time 0940    PT Time Calculation (min) 50 min    Activity Tolerance Patient tolerated treatment well    Behavior During Therapy [facility] for tasks assessed/performed             Past Medical History:  Diagnosis Date   Anemia    pt claims this was resolved   Arthritis    "qwhere; feet, knees, neck" (04/21/2012)   Bladder incontinence    Chronic back pain    Closed angle glaucoma    "both eyes" (04/21/2012)   Difficult intubation    with intubation on November 26, 2014. Glidescope and 4 noted in op notes. "Difficult Airway- due to reduced neck mobility and Difficult Airway- due to anterior larynx" also noted in anesthesia notes 11-2014   Numbness    fingertips bilat and feet bilat    OSA on CPAP    Overactive bladder    Peripheral neuropathy    feet bilat    Pneumonia    hx. of as child   PONV (postoperative nausea and vomiting)    Prediabetes    Urinary tract bacterial infections    pt states urologist has resolved this so far, but she is "prone to UTIs"   Past Surgical History:  Procedure Laterality Date   ABDOMINAL HYSTERECTOMY  1980   "partial" (04/21/2012)   ANTERIOR FUSION CERVICAL SPINE  12/2005   APPENDECTOMY     BLADDER SUSPENSION  1990   BREAST EXCISIONAL BIOPSY Bilateral    Benign   CHOLECYSTECTOMY N/A 10/17/2019   Procedure: LAPAROSCOPIC CHOLECYSTECTOMY;  Surgeon: Stark Klein, MD;  Location: [place];  Service: General;  Laterality: N/A;   [place] Bilateral 2019   cataracts  removed   INTRAOPERATIVE CHOLANGIOGRAM N/A 10/17/2019   Procedure: POSSIBLE INTRAOPERATIVE CHOLANGIOGRAM;  Surgeon: Stark Klein, MD;  Location: [place];  Service: General;  Laterality: N/A;   IRRIGATION AND DEBRIDEMENT KNEE Left 01/25/2017   Procedure: IRRIGATION AND DEBRIDEMENT KNEE;  Surgeon: Gaynelle Arabian, MD;  Location: [facility];  Service: Orthopedics;  Laterality: Left;   KNEE ARTHROPLASTY Left 06/2008   "slipped on ice; torn ligaments & cartilage" (04/21/2012)   KNEE CLOSED REDUCTION Left 03/25/2015   Procedure: CLOSED MANIPULATION LEFT KNEE;  Surgeon: Gaynelle Arabian, MD;  Location: [facility];  Service: Orthopedics;  Laterality: Left;   KNEE CLOSED REDUCTION Left 11/18/2015   Procedure: LEFT KNEE CLOSED MANIPULATION;  Surgeon: Gaynelle Arabian, MD;  Location: [facility];  Service: Orthopedics;  Laterality: Left;   LUMBAR LAMINECTOMY/DECOMPRESSION MICRODISCECTOMY Bilateral 07/17/2021   Procedure: LUMBAR LAMINECTOMY/FORAMINOTOMY LUMBAR ONE-TWO , LUMBAR TWO- THREE, LUMBAR THREE- FOUR;  Surgeon: Newman Pies, MD;  Location: [place];  Service: Neurosurgery;  Laterality: Bilateral;   PATELLAR TENDON REPAIR Left 12/10/2014   Procedure: REPAIR PARTIAL QUAD TENDON TEAR;  Surgeon: Gaynelle Arabian, MD;  Location: [facility];  Service: Orthopedics;  Laterality: Left;   POSTERIOR LUMBAR FUSION  04/21/2012   TONSILLECTOMY AND ADENOIDECTOMY  ~  1950   TOTAL KNEE ARTHROPLASTY Left 11/26/2014   Procedure: TOTAL LEFT   KNEE ARTHROPLASTY;  Surgeon: Gaynelle Arabian, MD;  Location: [facility];  Service: Orthopedics;  Laterality: Left;   TOTAL KNEE ARTHROPLASTY Right 11/18/2015   Procedure: RIGHT TOTAL KNEE ARTHROPLASTY;  Surgeon: Gaynelle Arabian, MD;  Location: [facility];  Service: Orthopedics;  Laterality: Right;   TUBAL LIGATION  1970's   Patient Active Problem List   Diagnosis Date Noted   Spinal stenosis of lumbar region with neurogenic claudication 07/17/2021   Chronic calculous cholecystitis 10/17/2019   Abscess of knee, left  01/25/2017   Arthrofibrosis of total knee arthroplasty ([place]) 03/25/2015   Quadriceps tendon rupture 12/09/2014   OA (osteoarthritis) of knee 11/26/2014    REFERRING DIAG: Spinal Stenosis, Lumbar region with neurogenic claudication  THERAPY DIAG:  Other low back pain  Muscle spasm of back  Muscle weakness (generalized)  Difficulty in walking, not elsewhere classified  Rationale for Evaluation and Treatment Rehabilitation  PERTINENT HISTORY: lumbar laminectomy 07-17-21 (fusion L4-L5 fusion and cervical fusion)  PRECAUTIONS: none  SUBJECTIVE: Patient states she is doing ok,  but more low back pain from her workouts at the gym since our last visit.     PAIN:  Are you having pain? Yes: NPRS scale: 2-3/10 Pain location: low back Pain description: mild ache Aggravating factors: prolonged standing Relieving factors: meds, exercises   OBJECTIVE: (objective measures completed at initial evaluation unless otherwise dated)   OBJECTIVE:    DIAGNOSTIC FINDINGS:      PATIENT SURVEYS:  Initial eval: FOTO 40  02/13/22: FOTO  53    SCREENING FOR RED FLAGS: Bowel or bladder incontinence: No Spinal tumors: No Cauda equina syndrome: No Compression fracture: No Abdominal aneurysm: No   COGNITION:           Overall cognitive status: Within functional limits for tasks assessed                          SENSATION: WFL Not tested- pt denies numbness/tingling   MUSCLE LENGTH: Hamstrings: Right  deg; Left  deg Marcello Moores test: Right  deg; Left  deg   POSTURE: rounded shoulders and forward head   PALPATION: Scar adhesions noted lumbar spine from recent surgery, tenderness lumbar paraspinals   LUMBAR ROM:    Active  A/PROM  eval AROM 02/13/22  Flexion WNL   Extension Limited 50% pain end range  Limited approx 25% with minimal pain  Right lateral flexion     Left lateral flexion     Right rotation WNL   Left rotation WNL    (Blank rows = not tested)   LOWER EXTREMITY ROM:       Active  Right eval Right 02/13/22 Left eval Left  106/23  Hip flexion        Hip extension        Hip abduction        Hip adduction        Hip internal rotation 25 33 20 28  Hip external rotation _0 Knee flexion        Knee extension        Ankle dorsiflexion        Ankle plantarflexion        Ankle inversion        Ankle eversion         (Blank rows = not tested)   LOWER EXTREMITY MMT:  MMT Right eval Left eval  Hip flexion 5 5  Hip extension 5 5  Hip abduction 5 5  Hip adduction      Hip internal rotation      Hip external rotation      Knee flexion      Knee extension      Ankle dorsiflexion      Ankle plantarflexion      Ankle inversion      Ankle eversion       (Blank rows = not tested)   LUMBAR SPECIAL TESTS:      FUNCTIONAL TESTS:  Initial eval:  5 times sit to stand: 13 sec, Lt LE unable to make it under table  02/13/22: 12.98 sec    GAIT:   Comments: decreased hip extension, decreased trunk rotation        TODAY'S TREATMENT  10/11: NuStep x 6 min level 5 (PT present to discuss progress) Seated LAQ x 20 with 6 lb ankle weight (both) Seated up and over hurdle 2 x 10 with 6 lb (both) Lower trunk rotation x 20 PPT x 20 PPT with 90/90 heel tap x 20 PPT with SLR to plyo ball shoulder flexion (red plyo ball)  2 x 10 each LE PPT with dying bug unsupported x 20 Hook lying trunk rotation with plyo ball x 10 each side Discussed merits of using ice for pain control and modifying exercise when symptoms are elevated Ice x 10 min in hook lying 10/2: Re-assessment Lower trunk rotation x 20 PPT x 20 PPT with 90/90 heel tap x 20 PPT with SLR to plyo ball shoulder flexion (red plyo ball)  2 x 10 each LE PPT with dying bug unsupported x 20 Hook lying trunk rotation with plyo ball x 10 each side  9/26: Nustep x 5 min level 5(PT present to discuss status) Lower trunk rotation x 10 PPT x 10 Supine clams green band bil 10x; single leg  10x each Supine green band heel slides and opposite arm elevation 15x Seated cat/camel 10x Planks at the counter 15 sec Standing Floor sliders lateral slides 10x Standing Floor sliders extension 10x Green band around thighs side stepping 5 laps Neuromuscular re-education: muscle activation with verbal and tactile cues to improve lumbo/pelvic/hip stability PPT with 90/90 heel tap x 20 PPT with SLR to plyo ball shoulder flexion (red plyo ball)  2 x 10 each LE PPT with dying bug unsupported x 20 Hook lying trunk rotation with plyo ball  Plank position at counter : Cat/camel x 10 Plank at counter x 10 sec, x 15 sec, x 20 sec (patient did not feel challenged- switched to edge of mat table)    PATIENT EDUCATION:  Education details: eval findings/POC Person educated: Patient Education method: Explanation, Demonstration, and Handouts Education comprehension: verbalized understanding and returned demonstration     HOME EXERCISE PROGRAM: Access Code: [facility] URL: [email or web address] Date: 02/03/2022 Prepared by: Ruben Im  Exercises - Supine Hip Internal and External Rotation  - 2 x daily - 7 x weekly - 15 reps - Supine Hamstring Stretch with Strap  - 1 x daily - 7 x weekly - 1 sets - 2 reps - 20 sec hold - Supine ITB Stretch with Strap  - 1 x daily - 7 x weekly - 1 sets - 2 reps - 30 sec hold - Supine Lower Trunk Rotation  - 1 x daily - 7 x weekly - 1 sets - 10 reps - Supine Posterior Pelvic Tilt  -  1 x daily - 7 x weekly - 2 sets - 10 reps - Supine 90/90 Alternating Heel Touches with Posterior Pelvic Tilt  - 1 x daily - 7 x weekly - 2 sets - 10 reps - Straight Leg Raise  - 1 x daily - 7 x weekly - 2 sets - 10 reps - Supine Dead Bug with Leg Extension  - 1 x daily - 7 x weekly - 2 sets - 10 reps - Hooklying Clamshell with Resistance  - 1 x daily - 7 x weekly - 1 sets - 10 reps - Standing Hip Abduction on Slider  - 1 x daily - 7 x weekly - 1 sets - 10 reps - Side  Stepping with Resistance at Thighs  - 1 x daily - 7 x weekly - 1 sets - 5 reps   ASSESSMENT:   CLINICAL IMPRESSION: Clare is able to work through her soreness but has recurrence of symptoms when she does her more aggressive workouts.  She understands she needs to use some ice on days where she feels sore to control inflammation.  She would benefit from skilled PT to meet her goals of routine daily activity with min need for rest.  Also wants to avoid surgery.        OBJECTIVE IMPAIRMENTS Abnormal gait, decreased activity tolerance, decreased mobility, decreased ROM, decreased strength, hypomobility, increased fascial restrictions, increased muscle spasms, impaired flexibility, improper body mechanics, and pain.    ACTIVITY LIMITATIONS bending, standing, squatting, stairs, and bed mobility   PARTICIPATION LIMITATIONS: cleaning, laundry, community activity, yard work, and [place] Age, Past/current experiences, and 3 comorbidities: Lt knee ROM limitations post op complications, Lumbar surgery x2, hip arthritis are also affecting patient's functional outcome.  are also affecting patient's functional outcome.    REHAB POTENTIAL: Good   CLINICAL DECISION MAKING: Evolving/moderate complexity   EVALUATION COMPLEXITY: Moderate     GOALS: Goals reviewed with patient? Yes   SHORT TERM GOALS: Target date: 01/06/2022   Pt will be independent with her initial HEP to improve lumbar pain and LE strength.  Baseline: Goal status: MET   2.  Pt will demo proper log roll technique with bed mobility during session, without the need for PT cuing.  Baseline:  Goal status: MET           LONG TERM GOALS: Target date: 03/12/22   Pt will have greater than 35 deg hip IR and ER bilaterally to improve her mechanics with walking and rotational activity.  Baseline: 20-30 deg rotation bilaterally, Lt worse than Rt  Goal status: PROGRESSING   2.  Pt will have improved trunk strength  evident by her ability to get in/out of bed without increase in low back pain.  Baseline: painful Goal status: PROGRESSING   3.  Pt will be able to demonstrate proper mechanics with picking up objects from the floor without excessive lumbar flexion compensation. Baseline:  Goal status: PROGRESSING   4.  Pt will report  being able to stand and complete mopping/sweeping activity for greater than 15 minutes without increase in low back pain.  Baseline:  Goal status: PROGRESSING   5.  Pt's FOTO score will increased to atleast 49 from start of PT. Baseline: 40 Goal status: MET         PLAN: PT FREQUENCY: 1x/week   PT DURATION: until f/u with MD on 11/10.  Recert 65-9-93 through 03/12/22   PLANNED INTERVENTIONS: Therapeutic exercises, Therapeutic activity, Neuromuscular re-education, Gait training,  Patient/Family education, Self Care, Joint mobilization, Dry Needling, Spinal mobilization, Moist heat, Taping, Manual therapy, and Re-evaluation.   PLAN FOR NEXT SESSION: hip strengthening; trunk strengthening including rotation, try pallof series;  hip ER and IR rotation flexibility- need to consider Lt knee ROM limitations; scar mobilization   Kenner Lewan B. Rotunda Worden, PT 02/18/22 9:31 AM  Phone: [phone] Fax: [phone]  [facility] [address] [place] [address] [place], [place] [address] Phone # [phone] Fax [phone]

## 2022-02-25 ENCOUNTER — Ambulatory Visit: Payer: Medicare Other

## 2022-02-25 DIAGNOSIS — M62830 Muscle spasm of back: Secondary | ICD-10-CM | POA: Diagnosis not present

## 2022-02-25 DIAGNOSIS — M5459 Other low back pain: Secondary | ICD-10-CM | POA: Diagnosis not present

## 2022-02-25 DIAGNOSIS — R262 Difficulty in walking, not elsewhere classified: Secondary | ICD-10-CM | POA: Diagnosis not present

## 2022-02-25 DIAGNOSIS — M6281 Muscle weakness (generalized): Secondary | ICD-10-CM

## 2022-02-25 NOTE — Therapy (Signed)
OUTPATIENT PHYSICAL THERAPY TREATMENT NOTE   Patient Name: Lauren English MRN: [id number] DOB:February 16, 1941, 81 y.o., female Today's Date: 02/25/2022  PCP: Jonathon Jordan, MD REFERRING PROVIDER: Newman Pies, MD  END OF SESSION:   PT End of Session - 02/25/22 0935     Visit Number 9    Date for PT Re-Evaluation 03/12/22    Authorization Type Medicare    Authorization Time Period 02/13/22 through 03/12/22    PT Start Time 0932    PT Stop Time 1015    PT Time Calculation (min) 43 min    Activity Tolerance Patient tolerated treatment well    Behavior During Therapy [facility] for tasks assessed/performed             Past Medical History:  Diagnosis Date   Anemia    pt claims this was resolved   Arthritis    "qwhere; feet, knees, neck" (04/21/2012)   Bladder incontinence    Chronic back pain    Closed angle glaucoma    "both eyes" (04/21/2012)   Difficult intubation    with intubation on November 26, 2014. Glidescope and 4 noted in op notes. "Difficult Airway- due to reduced neck mobility and Difficult Airway- due to anterior larynx" also noted in anesthesia notes 11-2014   Numbness    fingertips bilat and feet bilat    OSA on CPAP    Overactive bladder    Peripheral neuropathy    feet bilat    Pneumonia    hx. of as child   PONV (postoperative nausea and vomiting)    Prediabetes    Urinary tract bacterial infections    pt states urologist has resolved this so far, but she is "prone to UTIs"   Past Surgical History:  Procedure Laterality Date   ABDOMINAL HYSTERECTOMY  1980   "partial" (04/21/2012)   ANTERIOR FUSION CERVICAL SPINE  12/2005   APPENDECTOMY     BLADDER SUSPENSION  1990   BREAST EXCISIONAL BIOPSY Bilateral    Benign   CHOLECYSTECTOMY N/A 10/17/2019   Procedure: LAPAROSCOPIC CHOLECYSTECTOMY;  Surgeon: Stark Klein, MD;  Location: [place];  Service: General;  Laterality: N/A;   [place] Bilateral 2019   cataracts  removed   INTRAOPERATIVE CHOLANGIOGRAM N/A 10/17/2019   Procedure: POSSIBLE INTRAOPERATIVE CHOLANGIOGRAM;  Surgeon: Stark Klein, MD;  Location: [place];  Service: General;  Laterality: N/A;   IRRIGATION AND DEBRIDEMENT KNEE Left 01/25/2017   Procedure: IRRIGATION AND DEBRIDEMENT KNEE;  Surgeon: Gaynelle Arabian, MD;  Location: [facility];  Service: Orthopedics;  Laterality: Left;   KNEE ARTHROPLASTY Left 06/2008   "slipped on ice; torn ligaments & cartilage" (04/21/2012)   KNEE CLOSED REDUCTION Left 03/25/2015   Procedure: CLOSED MANIPULATION LEFT KNEE;  Surgeon: Gaynelle Arabian, MD;  Location: [facility];  Service: Orthopedics;  Laterality: Left;   KNEE CLOSED REDUCTION Left 11/18/2015   Procedure: LEFT KNEE CLOSED MANIPULATION;  Surgeon: Gaynelle Arabian, MD;  Location: [facility];  Service: Orthopedics;  Laterality: Left;   LUMBAR LAMINECTOMY/DECOMPRESSION MICRODISCECTOMY Bilateral 07/17/2021   Procedure: LUMBAR LAMINECTOMY/FORAMINOTOMY LUMBAR ONE-TWO , LUMBAR TWO- THREE, LUMBAR THREE- FOUR;  Surgeon: Newman Pies, MD;  Location: [place] [facility];  Service: Neurosurgery;  Laterality: Bilateral;   PATELLAR TENDON REPAIR Left 12/10/2014   Procedure: REPAIR PARTIAL QUAD TENDON TEAR;  Surgeon: Gaynelle Arabian, MD;  Location: [facility];  Service: Orthopedics;  Laterality: Left;   POSTERIOR LUMBAR FUSION  04/21/2012   TONSILLECTOMY AND ADENOIDECTOMY  ~  1950   TOTAL KNEE ARTHROPLASTY Left 11/26/2014   Procedure: TOTAL LEFT   KNEE ARTHROPLASTY;  Surgeon: Gaynelle Arabian, MD;  Location: [facility];  Service: Orthopedics;  Laterality: Left;   TOTAL KNEE ARTHROPLASTY Right 11/18/2015   Procedure: RIGHT TOTAL KNEE ARTHROPLASTY;  Surgeon: Gaynelle Arabian, MD;  Location: [facility];  Service: Orthopedics;  Laterality: Right;   TUBAL LIGATION  1970's   Patient Active Problem List   Diagnosis Date Noted   Spinal stenosis of lumbar region with neurogenic claudication 07/17/2021   Chronic calculous cholecystitis 10/17/2019   Abscess of knee, left  01/25/2017   Arthrofibrosis of total knee arthroplasty ([place]) 03/25/2015   Quadriceps tendon rupture 12/09/2014   OA (osteoarthritis) of knee 11/26/2014    REFERRING DIAG: Spinal Stenosis, Lumbar region with neurogenic claudication  THERAPY DIAG:  Other low back pain  Muscle spasm of back  Muscle weakness (generalized)  Difficulty in walking, not elsewhere classified  Rationale for Evaluation and Treatment Rehabilitation  PERTINENT HISTORY: lumbar laminectomy 07-17-21 (fusion L4-L5 fusion and cervical fusion)  PRECAUTIONS: none  SUBJECTIVE: Patient had no complaints.  Minimal pain.      PAIN:  Are you having pain? Yes: NPRS scale: 0/10 Pain location: low back Pain description: mild ache Aggravating factors: prolonged standing Relieving factors: meds, exercises   OBJECTIVE: (objective measures completed at initial evaluation unless otherwise dated)   OBJECTIVE:    DIAGNOSTIC FINDINGS:      PATIENT SURVEYS:  Initial eval: FOTO 40  02/13/22: FOTO  53    SCREENING FOR RED FLAGS: Bowel or bladder incontinence: No Spinal tumors: No Cauda equina syndrome: No Compression fracture: No Abdominal aneurysm: No   COGNITION:           Overall cognitive status: Within functional limits for tasks assessed                          SENSATION: WFL Not tested- pt denies numbness/tingling   MUSCLE LENGTH: Hamstrings: Right  deg; Left  deg Marcello Moores test: Right  deg; Left  deg   POSTURE: rounded shoulders and forward head   PALPATION: Scar adhesions noted lumbar spine from recent surgery, tenderness lumbar paraspinals   LUMBAR ROM:    Active  A/PROM  eval AROM 02/13/22  Flexion WNL   Extension Limited 50% pain end range  Limited approx 25% with minimal pain  Right lateral flexion     Left lateral flexion     Right rotation WNL   Left rotation WNL    (Blank rows = not tested)   LOWER EXTREMITY ROM:      Active  Right eval Right 02/13/22 Left eval Left  106/23   Hip flexion        Hip extension        Hip abduction        Hip adduction        Hip internal rotation 25 33 20 28  Hip external rotation _0 Knee flexion        Knee extension        Ankle dorsiflexion        Ankle plantarflexion        Ankle inversion        Ankle eversion         (Blank rows = not tested)   LOWER EXTREMITY MMT:     MMT Right eval Left eval  Hip flexion 5 5  Hip extension  5 5  Hip abduction 5 5  Hip adduction      Hip internal rotation      Hip external rotation      Knee flexion      Knee extension      Ankle dorsiflexion      Ankle plantarflexion      Ankle inversion      Ankle eversion       (Blank rows = not tested)   LUMBAR SPECIAL TESTS:      FUNCTIONAL TESTS:  Initial eval:  5 times sit to stand: 13 sec, Lt LE unable to make it under table  02/13/22: 12.98 sec    GAIT:   Comments: decreased hip extension, decreased trunk rotation        TODAY'S TREATMENT  10/18: NuStep x 6 min level 5 (PT present to discuss progress) Seated LAQ x 20 with 6 lb ankle weight (both) Seated up and over hurdle 2 x 10 with 6 lb (both) Lower trunk rotation x 20 PPT x 20 PPT with 90/90 heel tap x 20 PPT with SLR to plyo ball shoulder flexion (yellow plyo ball)  2 x 10 each LE PPT with dying bug unsupported x 20 Hook lying trunk rotation with plyo ball x 10 each side Discussed merits of using ice for pain control and modifying exercise when symptoms are elevated Declined ice  10/11: NuStep x 6 min level 5 (PT present to discuss progress) Seated LAQ x 20 with 6 lb ankle weight (both) Seated up and over hurdle 2 x 10 with 6 lb (both) Lower trunk rotation x 20 PPT x 20 PPT with 90/90 heel tap x 20 PPT with SLR to plyo ball shoulder flexion (red plyo ball)  2 x 10 each LE PPT with dying bug unsupported x 20 Hook lying trunk rotation with plyo ball x 10 each side Discussed merits of using ice for pain control and modifying exercise when  symptoms are elevated Ice x 10 min in hook lying 10/2: Re-assessment Lower trunk rotation x 20 PPT x 20 PPT with 90/90 heel tap x 20 PPT with SLR to plyo ball shoulder flexion (red plyo ball)  2 x 10 each LE PPT with dying bug unsupported x 20 Hook lying trunk rotation with plyo ball x 10 each side    PATIENT EDUCATION:  Education details: eval findings/POC Person educated: Patient Education method: Explanation, Demonstration, and Handouts Education comprehension: verbalized understanding and returned demonstration     HOME EXERCISE PROGRAM: Access Code: [facility] URL: [email or web address] Date: 02/03/2022 Prepared by: Ruben Im  Exercises - Supine Hip Internal and External Rotation  - 2 x daily - 7 x weekly - 15 reps - Supine Hamstring Stretch with Strap  - 1 x daily - 7 x weekly - 1 sets - 2 reps - 20 sec hold - Supine ITB Stretch with Strap  - 1 x daily - 7 x weekly - 1 sets - 2 reps - 30 sec hold - Supine Lower Trunk Rotation  - 1 x daily - 7 x weekly - 1 sets - 10 reps - Supine Posterior Pelvic Tilt  - 1 x daily - 7 x weekly - 2 sets - 10 reps - Supine 90/90 Alternating Heel Touches with Posterior Pelvic Tilt  - 1 x daily - 7 x weekly - 2 sets - 10 reps - Straight Leg Raise  - 1 x daily - 7 x weekly - 2 sets - 10 reps - Supine Dead  Bug with Leg Extension  - 1 x daily - 7 x weekly - 2 sets - 10 reps - Hooklying Clamshell with Resistance  - 1 x daily - 7 x weekly - 1 sets - 10 reps - Standing Hip Abduction on Slider  - 1 x daily - 7 x weekly - 1 sets - 10 reps - Side Stepping with Resistance at Thighs  - 1 x daily - 7 x weekly - 1 sets - 5 reps   ASSESSMENT:   CLINICAL IMPRESSION: Lauren English had an exacerbation of symptoms on Sunday due to being parked too close to the car beside her and due to her lack of knee flexion on left, she had to get into some uncomfortable positions to get in and out of her car.  She was pain free upon arrival today, however,  indicating that she is able to work through these pain exacerbations.  She is very diligent with her HEP.  She is using ice more regularly.  She will be having MRI soon to determine severity of her degenerative changes at L4-L5.  She would benefit from continued skilled PT to gain core strength to possibly avoid surgery.       OBJECTIVE IMPAIRMENTS Abnormal gait, decreased activity tolerance, decreased mobility, decreased ROM, decreased strength, hypomobility, increased fascial restrictions, increased muscle spasms, impaired flexibility, improper body mechanics, and pain.    ACTIVITY LIMITATIONS bending, standing, squatting, stairs, and bed mobility   PARTICIPATION LIMITATIONS: cleaning, laundry, community activity, yard work, and [place] Age, Past/current experiences, and 3 comorbidities: Lt knee ROM limitations post op complications, Lumbar surgery x2, hip arthritis are also affecting patient's functional outcome.  are also affecting patient's functional outcome.    REHAB POTENTIAL: Good   CLINICAL DECISION MAKING: Evolving/moderate complexity   EVALUATION COMPLEXITY: Moderate     GOALS: Goals reviewed with patient? Yes   SHORT TERM GOALS: Target date: 01/06/2022   Pt will be independent with her initial HEP to improve lumbar pain and LE strength.  Baseline: Goal status: MET   2.  Pt will demo proper log roll technique with bed mobility during session, without the need for PT cuing.  Baseline:  Goal status: MET           LONG TERM GOALS: Target date: 03/12/22   Pt will have greater than 35 deg hip IR and ER bilaterally to improve her mechanics with walking and rotational activity.  Baseline: 20-30 deg rotation bilaterally, Lt worse than Rt  Goal status: PROGRESSING   2.  Pt will have improved trunk strength evident by her ability to get in/out of bed without increase in low back pain.  Baseline: painful Goal status: PROGRESSING   3.  Pt will be able to  demonstrate proper mechanics with picking up objects from the floor without excessive lumbar flexion compensation. Baseline:  Goal status: PROGRESSING   4.  Pt will report  being able to stand and complete mopping/sweeping activity for greater than 15 minutes without increase in low back pain.  Baseline:  Goal status: PROGRESSING   5.  Pt's FOTO score will increased to atleast 49 from start of PT. Baseline: 40 Goal status: MET         PLAN: PT FREQUENCY: 1x/week   PT DURATION: until f/u with MD on 11/10.  Recert 21-9-75 through 03/12/22   PLANNED INTERVENTIONS: Therapeutic exercises, Therapeutic activity, Neuromuscular re-education, Gait training, Patient/Family education, Self Care, Joint mobilization, Dry Needling, Spinal mobilization, Moist heat, Taping,  Manual therapy, and Re-evaluation.   PLAN FOR NEXT SESSION: hip strengthening; trunk strengthening including rotation,  pallof series;  hip ER and IR rotation flexibility- need to consider Lt knee ROM limitations; scar mobilization   Lauren English B. Balinda Heacock, PT 02/25/22 12:57 PM   Phone: [phone] Fax: [phone]  [facility] [address], [address] [place] [address] [place], [place] [address] Phone # [phone] Fax [phone]

## 2022-02-26 DIAGNOSIS — M48062 Spinal stenosis, lumbar region with neurogenic claudication: Secondary | ICD-10-CM | POA: Diagnosis not present

## 2022-03-04 ENCOUNTER — Ambulatory Visit: Payer: Medicare Other

## 2022-03-04 DIAGNOSIS — M62830 Muscle spasm of back: Secondary | ICD-10-CM | POA: Diagnosis not present

## 2022-03-04 DIAGNOSIS — M5459 Other low back pain: Secondary | ICD-10-CM | POA: Diagnosis not present

## 2022-03-04 DIAGNOSIS — M6281 Muscle weakness (generalized): Secondary | ICD-10-CM | POA: Diagnosis not present

## 2022-03-04 DIAGNOSIS — R262 Difficulty in walking, not elsewhere classified: Secondary | ICD-10-CM

## 2022-03-04 NOTE — Therapy (Signed)
OUTPATIENT PHYSICAL THERAPY TREATMENT NOTE   Patient Name: Lauren English MRN: [id number] DOB:Aug 13, 1940, 81 y.o., female Today's Date: 03/04/2022  PCP: Jonathon Jordan, MD REFERRING PROVIDER: Newman Pies, MD  END OF SESSION:   PT End of Session - 03/04/22 0933     Visit Number 10    Date for PT Re-Evaluation 03/12/22    Authorization Type Medicare    Authorization Time Period 02/13/22 through 03/12/22    PT Start Time 0933    PT Stop Time 1014    PT Time Calculation (min) 41 min    Activity Tolerance Patient tolerated treatment well    Behavior During Therapy [facility] for tasks assessed/performed             Past Medical History:  Diagnosis Date   Anemia    pt claims this was resolved   Arthritis    "qwhere; feet, knees, neck" (04/21/2012)   Bladder incontinence    Chronic back pain    Closed angle glaucoma    "both eyes" (04/21/2012)   Difficult intubation    with intubation on November 26, 2014. Glidescope and 4 noted in op notes. "Difficult Airway- due to reduced neck mobility and Difficult Airway- due to anterior larynx" also noted in anesthesia notes 11-2014   Numbness    fingertips bilat and feet bilat    OSA on CPAP    Overactive bladder    Peripheral neuropathy    feet bilat    Pneumonia    hx. of as child   PONV (postoperative nausea and vomiting)    Prediabetes    Urinary tract bacterial infections    pt states urologist has resolved this so far, but she is "prone to UTIs"   Past Surgical History:  Procedure Laterality Date   ABDOMINAL HYSTERECTOMY  1980   "partial" (04/21/2012)   ANTERIOR FUSION CERVICAL SPINE  12/2005   APPENDECTOMY     BLADDER SUSPENSION  1990   BREAST EXCISIONAL BIOPSY Bilateral    Benign   CHOLECYSTECTOMY N/A 10/17/2019   Procedure: LAPAROSCOPIC CHOLECYSTECTOMY;  Surgeon: Stark Klein, MD;  Location: [place];  Service: General;  Laterality: N/A;   [place] Bilateral 2019   cataracts  removed   INTRAOPERATIVE CHOLANGIOGRAM N/A 10/17/2019   Procedure: POSSIBLE INTRAOPERATIVE CHOLANGIOGRAM;  Surgeon: Stark Klein, MD;  Location: [place];  Service: General;  Laterality: N/A;   IRRIGATION AND DEBRIDEMENT KNEE Left 01/25/2017   Procedure: IRRIGATION AND DEBRIDEMENT KNEE;  Surgeon: Gaynelle Arabian, MD;  Location: [facility];  Service: Orthopedics;  Laterality: Left;   KNEE ARTHROPLASTY Left 06/2008   "slipped on ice; torn ligaments & cartilage" (04/21/2012)   KNEE CLOSED REDUCTION Left 03/25/2015   Procedure: CLOSED MANIPULATION LEFT KNEE;  Surgeon: Gaynelle Arabian, MD;  Location: [facility];  Service: Orthopedics;  Laterality: Left;   KNEE CLOSED REDUCTION Left 11/18/2015   Procedure: LEFT KNEE CLOSED MANIPULATION;  Surgeon: Gaynelle Arabian, MD;  Location: [facility];  Service: Orthopedics;  Laterality: Left;   LUMBAR LAMINECTOMY/DECOMPRESSION MICRODISCECTOMY Bilateral 07/17/2021   Procedure: LUMBAR LAMINECTOMY/FORAMINOTOMY LUMBAR ONE-TWO , LUMBAR TWO- THREE, LUMBAR THREE- FOUR;  Surgeon: Newman Pies, MD;  Location: [place];  Service: Neurosurgery;  Laterality: Bilateral;   PATELLAR TENDON REPAIR Left 12/10/2014   Procedure: REPAIR PARTIAL QUAD TENDON TEAR;  Surgeon: Gaynelle Arabian, MD;  Location: [facility];  Service: Orthopedics;  Laterality: Left;   POSTERIOR LUMBAR FUSION  04/21/2012   TONSILLECTOMY AND ADENOIDECTOMY  ~  1950   TOTAL KNEE ARTHROPLASTY Left 11/26/2014   Procedure: TOTAL LEFT   KNEE ARTHROPLASTY;  Surgeon: Gaynelle Arabian, MD;  Location: [facility];  Service: Orthopedics;  Laterality: Left;   TOTAL KNEE ARTHROPLASTY Right 11/18/2015   Procedure: RIGHT TOTAL KNEE ARTHROPLASTY;  Surgeon: Gaynelle Arabian, MD;  Location: [facility];  Service: Orthopedics;  Laterality: Right;   TUBAL LIGATION  1970's   Patient Active Problem List   Diagnosis Date Noted   Spinal stenosis of lumbar region with neurogenic claudication 07/17/2021   Chronic calculous cholecystitis 10/17/2019   Abscess of knee, left  01/25/2017   Arthrofibrosis of total knee arthroplasty ([place]) 03/25/2015   Quadriceps tendon rupture 12/09/2014   OA (osteoarthritis) of knee 11/26/2014    REFERRING DIAG: Spinal Stenosis, Lumbar region with neurogenic claudication  THERAPY DIAG:  Other low back pain  Muscle spasm of back  Muscle weakness (generalized)  Difficulty in walking, not elsewhere classified  Rationale for Evaluation and Treatment Rehabilitation  PERTINENT HISTORY: lumbar laminectomy 07-17-21 (fusion L4-L5 fusion and cervical fusion)  PRECAUTIONS: none  SUBJECTIVE: Patient reports overall feeling she is making improvement.  Her pain is generally most severe with being active and standing for long periods of time but she is moving better and feels the benefit of doing the exercises.      PAIN:  Are you having pain? Yes: NPRS scale: 0/10 Pain location: low back Pain description: mild ache Aggravating factors: prolonged standing Relieving factors: meds, exercises  Progress Note Reporting Period 12/23/21 to 03/04/22  See note below for Objective Data and Assessment of Progress/Goals.     OBJECTIVE: (objective measures completed at initial evaluation unless otherwise dated)   OBJECTIVE:    DIAGNOSTIC FINDINGS:      PATIENT SURVEYS:  Initial eval: FOTO 40  02/13/22: FOTO  53  03/04/22: FOTO 54    SCREENING FOR RED FLAGS: Bowel or bladder incontinence: No Spinal tumors: No Cauda equina syndrome: No Compression fracture: No Abdominal aneurysm: No   COGNITION:           Overall cognitive status: Within functional limits for tasks assessed                          SENSATION: [facility] Not tested- pt denies numbness/tingling     POSTURE: rounded shoulders and forward head   PALPATION: Scar adhesions noted lumbar spine from recent surgery, tenderness lumbar paraspinals   LUMBAR ROM:    Active  A/PROM  eval AROM 02/13/22 AROM 02/13/22  Flexion WNL  WNL  Extension Limited 50% pain end  range  Limited approx 25% with minimal pain WNL without pain   Right lateral flexion    WNL  Left lateral flexion    WNL  Right rotation WNL  WNL  Left rotation WNL  WNL   (Blank rows = not tested)   LOWER EXTREMITY ROM:      Active  Right eval Right 02/13/22 Left eval Left  106/23  Hip flexion        Hip extension        Hip abduction        Hip adduction        Hip internal rotation 25 33 20 28  Hip external rotation 30 30 20 22   Knee flexion        Knee extension        Ankle dorsiflexion        Ankle plantarflexion  Ankle inversion        Ankle eversion         (Blank rows = not tested)   LOWER EXTREMITY MMT:     MMT Right eval Left eval  Hip flexion 5 5  Hip extension 5 5  Hip abduction 5 5  Hip adduction      Hip internal rotation      Hip external rotation      Knee flexion      Knee extension      Ankle dorsiflexion      Ankle plantarflexion      Ankle inversion      Ankle eversion       (Blank rows = not tested)   LUMBAR SPECIAL TESTS:      FUNCTIONAL TESTS:  Initial eval:  5 times sit to stand: 13 sec, Lt LE unable to make it under table  02/13/22: 12.98 sec  03/04/22: 11.74 sec    GAIT:   Comments: decreased hip extension, decreased trunk rotation        TODAY'S TREATMENT  10/25: NuStep x 6 min level 5 (PT present to discuss progress) 10th visit re-assessment  Seated LAQ x 20 with 6 lb ankle weight (both) Seated up and over hurdle 2 x 10 with 6 lb (both) Lower trunk rotation x 20 PPT x 20 PPT with 90/90 heel tap x 20 Prone over blue swiss ball  alternating arm and leg x 20 (ball wedged between chair and mat table for stability)  10/18: NuStep x 6 min level 5 (PT present to discuss progress) Seated LAQ x 20 with 6 lb ankle weight (both) Seated up and over hurdle 2 x 10 with 6 lb (both) Lower trunk rotation x 20 PPT x 20 PPT with 90/90 heel tap x 20 PPT with SLR to plyo ball shoulder flexion (yellow plyo ball)  2 x 10  each LE PPT with dying bug unsupported x 20 Hook lying trunk rotation with plyo ball x 10 each side Discussed merits of using ice for pain control and modifying exercise when symptoms are elevated Declined ice  10/11: NuStep x 6 min level 5 (PT present to discuss progress) Seated LAQ x 20 with 6 lb ankle weight (both) Seated up and over hurdle 2 x 10 with 6 lb (both) Lower trunk rotation x 20 PPT x 20 PPT with 90/90 heel tap x 20 PPT with SLR to plyo ball shoulder flexion (red plyo ball)  2 x 10 each LE PPT with dying bug unsupported x 20 Hook lying trunk rotation with plyo ball x 10 each side Discussed merits of using ice for pain control and modifying exercise when symptoms are elevated Ice x 10 min in hook lying    PATIENT EDUCATION:  Education details: eval findings/POC Person educated: Patient Education method: Explanation, Demonstration, and Handouts Education comprehension: verbalized understanding and returned demonstration     HOME EXERCISE PROGRAM: Access Code: [facility] URL: [email or web address] Date: 02/03/2022 Prepared by: Ruben Im  Exercises - Supine Hip Internal and External Rotation  - 2 x daily - 7 x weekly - 15 reps - Supine Hamstring Stretch with Strap  - 1 x daily - 7 x weekly - 1 sets - 2 reps - 20 sec hold - Supine ITB Stretch with Strap  - 1 x daily - 7 x weekly - 1 sets - 2 reps - 30 sec hold - Supine Lower Trunk Rotation  - 1 x daily - 7 x weekly -  1 sets - 10 reps - Supine Posterior Pelvic Tilt  - 1 x daily - 7 x weekly - 2 sets - 10 reps - Supine 90/90 Alternating Heel Touches with Posterior Pelvic Tilt  - 1 x daily - 7 x weekly - 2 sets - 10 reps - Straight Leg Raise  - 1 x daily - 7 x weekly - 2 sets - 10 reps - Supine Dead Bug with Leg Extension  - 1 x daily - 7 x weekly - 2 sets - 10 reps - Hooklying Clamshell with Resistance  - 1 x daily - 7 x weekly - 1 sets - 10 reps - Standing Hip Abduction on Slider  - 1 x daily - 7 x  weekly - 1 sets - 10 reps - Side Stepping with Resistance at Thighs  - 1 x daily - 7 x weekly - 1 sets - 5 reps   ASSESSMENT:   CLINICAL IMPRESSION: Annalisa still has occasional exacerbation of pain when she tries to stand for long periods of time.  She had an MRI the day after last visit.  She has not received the results yet.  She shows slight improvement in her functional scores and reports less pain overall.    She is very diligent with her HEP.  She is using ice regularly.   She would benefit from continued skilled PT to gain core strength to possibly avoid surgery.       OBJECTIVE IMPAIRMENTS Abnormal gait, decreased activity tolerance, decreased mobility, decreased ROM, decreased strength, hypomobility, increased fascial restrictions, increased muscle spasms, impaired flexibility, improper body mechanics, and pain.    ACTIVITY LIMITATIONS bending, standing, squatting, stairs, and bed mobility   PARTICIPATION LIMITATIONS: cleaning, laundry, community activity, yard work, and [place] Age, Past/current experiences, and 3 comorbidities: Lt knee ROM limitations post op complications, Lumbar surgery x2, hip arthritis are also affecting patient's functional outcome.  are also affecting patient's functional outcome.    REHAB POTENTIAL: Good   CLINICAL DECISION MAKING: Evolving/moderate complexity   EVALUATION COMPLEXITY: Moderate     GOALS: Goals reviewed with patient? Yes   SHORT TERM GOALS: Target date: 01/06/2022   Pt will be independent with her initial HEP to improve lumbar pain and LE strength.  Baseline: Goal status: MET   2.  Pt will demo proper log roll technique with bed mobility during session, without the need for PT cuing.  Baseline:  Goal status: MET           LONG TERM GOALS: Target date: 03/12/22   Pt will have greater than 35 deg hip IR and ER bilaterally to improve her mechanics with walking and rotational activity.  Baseline: 20-30 deg  rotation bilaterally, Lt worse than Rt  Goal status: PROGRESSING   2.  Pt will have improved trunk strength evident by her ability to get in/out of bed without increase in low back pain.  Baseline: painful Goal status: PROGRESSING   3.  Pt will be able to demonstrate proper mechanics with picking up objects from the floor without excessive lumbar flexion compensation. Baseline:  Goal status: PROGRESSING   4.  Pt will report  being able to stand and complete mopping/sweeping activity for greater than 15 minutes without increase in low back pain.  Baseline:  Goal status: PROGRESSING   5.  Pt's FOTO score will increased to atleast 49 from start of PT. Baseline: 40 Goal status: MET         PLAN: PT  FREQUENCY: 1x/week   PT DURATION: until f/u with MD on 11/10.  Recert 30-7-35 through 03/12/22   PLANNED INTERVENTIONS: Therapeutic exercises, Therapeutic activity, Neuromuscular re-education, Gait training, Patient/Family education, Self Care, Joint mobilization, Dry Needling, Spinal mobilization, Moist heat, Taping, Manual therapy, and Re-evaluation.   PLAN FOR NEXT SESSION: hip strengthening; trunk strengthening including rotation,  pallof series;  hip ER and IR rotation flexibility- need to consider Lt knee ROM limitations; scar mobilization   Shanee Batch B. Izrael Peak, PT 03/04/22 10:24 AM  Phone: [phone] Fax: [phone]  [facility] [address] [place] [address] [place], [place] [address] Phone # [phone] Fax [phone]

## 2022-03-10 ENCOUNTER — Ambulatory Visit: Payer: Medicare Other

## 2022-03-10 DIAGNOSIS — R262 Difficulty in walking, not elsewhere classified: Secondary | ICD-10-CM

## 2022-03-10 DIAGNOSIS — M6281 Muscle weakness (generalized): Secondary | ICD-10-CM

## 2022-03-10 DIAGNOSIS — M62830 Muscle spasm of back: Secondary | ICD-10-CM

## 2022-03-10 DIAGNOSIS — M5459 Other low back pain: Secondary | ICD-10-CM

## 2022-03-10 NOTE — Therapy (Signed)
OUTPATIENT PHYSICAL THERAPY DISCHARGE NOTE   Patient Name: Lauren English MRN: [id number] DOB:Dec 16, 1940, 81 y.o., female Today's Date: 03/10/2022  PCP: Jonathon Jordan, MD REFERRING PROVIDER: Newman Pies, MD  END OF SESSION:   PT End of Session - 03/10/22 1452     Visit Number 11    Date for PT Re-Evaluation 03/12/22    Authorization Type Medicare    Authorization Time Period 02/13/22 through 03/12/22    PT Start Time 1448    PT Stop Time 1520    PT Time Calculation (min) 32 min    Activity Tolerance Patient tolerated treatment well    Behavior During Therapy [facility] for tasks assessed/performed             Past Medical History:  Diagnosis Date   Anemia    pt claims this was resolved   Arthritis    "qwhere; feet, knees, neck" (04/21/2012)   Bladder incontinence    Chronic back pain    Closed angle glaucoma    "both eyes" (04/21/2012)   Difficult intubation    with intubation on November 26, 2014. Glidescope and 4 noted in op notes. "Difficult Airway- due to reduced neck mobility and Difficult Airway- due to anterior larynx" also noted in anesthesia notes 11-2014   Numbness    fingertips bilat and feet bilat    OSA on CPAP    Overactive bladder    Peripheral neuropathy    feet bilat    Pneumonia    hx. of as child   PONV (postoperative nausea and vomiting)    Prediabetes    Urinary tract bacterial infections    pt states urologist has resolved this so far, but she is "prone to UTIs"   Past Surgical History:  Procedure Laterality Date   ABDOMINAL HYSTERECTOMY  1980   "partial" (04/21/2012)   ANTERIOR FUSION CERVICAL SPINE  12/2005   APPENDECTOMY     BLADDER SUSPENSION  1990   BREAST EXCISIONAL BIOPSY Bilateral    Benign   CHOLECYSTECTOMY N/A 10/17/2019   Procedure: LAPAROSCOPIC CHOLECYSTECTOMY;  Surgeon: Stark Klein, MD;  Location: [place];  Service: General;  Laterality: N/A;   [place] Bilateral 2019   cataracts  removed   INTRAOPERATIVE CHOLANGIOGRAM N/A 10/17/2019   Procedure: POSSIBLE INTRAOPERATIVE CHOLANGIOGRAM;  Surgeon: Stark Klein, MD;  Location: [place];  Service: General;  Laterality: N/A;   IRRIGATION AND DEBRIDEMENT KNEE Left 01/25/2017   Procedure: IRRIGATION AND DEBRIDEMENT KNEE;  Surgeon: Gaynelle Arabian, MD;  Location: [facility];  Service: Orthopedics;  Laterality: Left;   KNEE ARTHROPLASTY Left 06/2008   "slipped on ice; torn ligaments & cartilage" (04/21/2012)   KNEE CLOSED REDUCTION Left 03/25/2015   Procedure: CLOSED MANIPULATION LEFT KNEE;  Surgeon: Gaynelle Arabian, MD;  Location: [facility];  Service: Orthopedics;  Laterality: Left;   KNEE CLOSED REDUCTION Left 11/18/2015   Procedure: LEFT KNEE CLOSED MANIPULATION;  Surgeon: Gaynelle Arabian, MD;  Location: [facility];  Service: Orthopedics;  Laterality: Left;   LUMBAR LAMINECTOMY/DECOMPRESSION MICRODISCECTOMY Bilateral 07/17/2021   Procedure: LUMBAR LAMINECTOMY/FORAMINOTOMY LUMBAR ONE-TWO , LUMBAR TWO- THREE, LUMBAR THREE- FOUR;  Surgeon: Newman Pies, MD;  Location: [place];  Service: Neurosurgery;  Laterality: Bilateral;   PATELLAR TENDON REPAIR Left 12/10/2014   Procedure: REPAIR PARTIAL QUAD TENDON TEAR;  Surgeon: Gaynelle Arabian, MD;  Location: [facility];  Service: Orthopedics;  Laterality: Left;   POSTERIOR LUMBAR FUSION  04/21/2012   TONSILLECTOMY AND ADENOIDECTOMY  ~  1950   TOTAL KNEE ARTHROPLASTY Left 11/26/2014   Procedure: TOTAL LEFT   KNEE ARTHROPLASTY;  Surgeon: Gaynelle Arabian, MD;  Location: [facility];  Service: Orthopedics;  Laterality: Left;   TOTAL KNEE ARTHROPLASTY Right 11/18/2015   Procedure: RIGHT TOTAL KNEE ARTHROPLASTY;  Surgeon: Gaynelle Arabian, MD;  Location: [facility];  Service: Orthopedics;  Laterality: Right;   TUBAL LIGATION  1970's   Patient Active Problem List   Diagnosis Date Noted   Spinal stenosis of lumbar region with neurogenic claudication 07/17/2021   Chronic calculous cholecystitis 10/17/2019   Abscess of knee, left  01/25/2017   Arthrofibrosis of total knee arthroplasty ([place]) 03/25/2015   Quadriceps tendon rupture 12/09/2014   OA (osteoarthritis) of knee 11/26/2014    REFERRING DIAG: Spinal Stenosis, Lumbar region with neurogenic claudication  THERAPY DIAG:  Other low back pain  Muscle spasm of back  Muscle weakness (generalized)  Difficulty in walking, not elsewhere classified  Rationale for Evaluation and Treatment Rehabilitation  PERTINENT HISTORY: lumbar laminectomy 07-17-21 (fusion L4-L5 fusion and cervical fusion)  PRECAUTIONS: none  SUBJECTIVE: Patient reports she feels she is doing well with combining her HEP with her workouts with her trainer.  She would like to discharge today.    PAIN:  Are you having pain? Yes: NPRS scale: 0/10 Pain location: low back Pain description: mild ache Aggravating factors: prolonged standing Relieving factors: meds, exercises     OBJECTIVE: (objective measures completed at initial evaluation unless otherwise dated)   OBJECTIVE:    DIAGNOSTIC FINDINGS:      PATIENT SURVEYS:  Initial eval: FOTO 40  02/13/22: FOTO  53  03/04/22: FOTO 54  03/10/22: FOTO 55    SCREENING FOR RED FLAGS: Bowel or bladder incontinence: No Spinal tumors: No Cauda equina syndrome: No Compression fracture: No Abdominal aneurysm: No   COGNITION:           Overall cognitive status: Within functional limits for tasks assessed                          SENSATION: [facility] Not tested- pt denies numbness/tingling     POSTURE: rounded shoulders and forward head   PALPATION: Scar adhesions noted lumbar spine from recent surgery, tenderness lumbar paraspinals   LUMBAR ROM:    Active  A/PROM  eval AROM 02/13/22 AROM 02/13/22  Flexion WNL  WNL  Extension Limited 50% pain end range  Limited approx 25% with minimal pain WNL without pain   Right lateral flexion    WNL  Left lateral flexion    WNL  Right rotation WNL  WNL  Left rotation WNL  WNL   (Blank rows  = not tested)   LOWER EXTREMITY ROM:      Active  Right eval Right 02/13/22 Left eval Left  02/13/22  Hip flexion        Hip extension        Hip abduction        Hip adduction        Hip internal rotation 25 33 20 28  Hip external rotation _0 Knee flexion        Knee extension        Ankle dorsiflexion        Ankle plantarflexion        Ankle inversion        Ankle eversion         (Blank rows = not tested)  LOWER EXTREMITY MMT:     MMT Right eval Left eval  Hip flexion 5 5  Hip extension 5 5  Hip abduction 5 5  Hip adduction      Hip internal rotation      Hip external rotation      Knee flexion      Knee extension      Ankle dorsiflexion      Ankle plantarflexion      Ankle inversion      Ankle eversion       (Blank rows = not tested)   LUMBAR SPECIAL TESTS:      FUNCTIONAL TESTS:  Initial eval:  5 times sit to stand: 13 sec, Lt LE unable to make it under table  02/13/22: 12.98 sec  03/04/22: 11.74 sec  03/10/22: 11.65 sec     GAIT:   Comments: decreased hip extension, decreased trunk rotation        TODAY'S TREATMENT  10/31: NuStep x 5 min level 7 Re-assessment for DC Reviewed and updated HEP and how to progress.   10/25: NuStep x 6 min level 5 (PT present to discuss progress) 10th visit re-assessment  Seated LAQ x 20 with 6 lb ankle weight (both) Seated up and over hurdle 2 x 10 with 6 lb (both) Lower trunk rotation x 20 PPT x 20 PPT with 90/90 heel tap x 20 Prone over blue swiss ball  alternating arm and leg x 20 (ball wedged between chair and mat table for stability)  10/18: NuStep x 6 min level 5 (PT present to discuss progress) Seated LAQ x 20 with 6 lb ankle weight (both) Seated up and over hurdle 2 x 10 with 6 lb (both) Lower trunk rotation x 20 PPT x 20 PPT with 90/90 heel tap x 20 PPT with SLR to plyo ball shoulder flexion (yellow plyo ball)  2 x 10 each LE PPT with dying bug unsupported x 20 Hook lying  trunk rotation with plyo ball x 10 each side Discussed merits of using ice for pain control and modifying exercise when symptoms are elevated Declined ice     PATIENT EDUCATION:  Education details: eval findings/POC Person educated: Patient Education method: Explanation, Demonstration, and Handouts Education comprehension: verbalized understanding and returned demonstration     HOME EXERCISE PROGRAM: Access Code: [facility] URL: [email or web address] Date: 02/03/2022 Prepared by: Ruben Im  Exercises - Supine Hip Internal and External Rotation  - 2 x daily - 7 x weekly - 15 reps - Supine Hamstring Stretch with Strap  - 1 x daily - 7 x weekly - 1 sets - 2 reps - 20 sec hold - Supine ITB Stretch with Strap  - 1 x daily - 7 x weekly - 1 sets - 2 reps - 30 sec hold - Supine Lower Trunk Rotation  - 1 x daily - 7 x weekly - 1 sets - 10 reps - Supine Posterior Pelvic Tilt  - 1 x daily - 7 x weekly - 2 sets - 10 reps - Supine 90/90 Alternating Heel Touches with Posterior Pelvic Tilt  - 1 x daily - 7 x weekly - 2 sets - 10 reps - Straight Leg Raise  - 1 x daily - 7 x weekly - 2 sets - 10 reps - Supine Dead Bug with Leg Extension  - 1 x daily - 7 x weekly - 2 sets - 10 reps - Hooklying Clamshell with Resistance  - 1 x daily - 7 x weekly -  1 sets - 10 reps - Standing Hip Abduction on Slider  - 1 x daily - 7 x weekly - 1 sets - 10 reps - Side Stepping with Resistance at Thighs  - 1 x daily - 7 x weekly - 1 sets - 5 reps   ASSESSMENT:   CLINICAL IMPRESSION: [place] has met all goals and is very compliant with her HEP.  She also works out regularly with her trainer.  She should continue to improve.  WE will DC at this time.         OBJECTIVE IMPAIRMENTS Abnormal gait, decreased activity tolerance, decreased mobility, decreased ROM, decreased strength, hypomobility, increased fascial restrictions, increased muscle spasms, impaired flexibility, improper body mechanics, and pain.     ACTIVITY LIMITATIONS bending, standing, squatting, stairs, and bed mobility   PARTICIPATION LIMITATIONS: cleaning, laundry, community activity, yard work, and [place] Age, Past/current experiences, and 3 comorbidities: Lt knee ROM limitations post op complications, Lumbar surgery x2, hip arthritis are also affecting patient's functional outcome.  are also affecting patient's functional outcome.    REHAB POTENTIAL: Good   CLINICAL DECISION MAKING: Evolving/moderate complexity   EVALUATION COMPLEXITY: Moderate     GOALS: Goals reviewed with patient? Yes   SHORT TERM GOALS: Target date: 01/06/2022   Pt will be independent with her initial HEP to improve lumbar pain and LE strength.  Baseline: Goal status: MET   2.  Pt will demo proper log roll technique with bed mobility during session, without the need for PT cuing.  Baseline:  Goal status: MET           LONG TERM GOALS: Target date: 03/12/22   Pt will have greater than 35 deg hip IR and ER bilaterally to improve her mechanics with walking and rotational activity.  Baseline: 20-30 deg rotation bilaterally, Lt worse than Rt  Goal status: MET   2.  Pt will have improved trunk strength evident by her ability to get in/out of bed without increase in low back pain.  Baseline: painful Goal status: MET   3.  Pt will be able to demonstrate proper mechanics with picking up objects from the floor without excessive lumbar flexion compensation. Baseline:  Goal status: MET   4.  Pt will report  being able to stand and complete mopping/sweeping activity for greater than 15 minutes without increase in low back pain.  Baseline:  Goal status: MET   5.  Pt's FOTO score will increased to atleast 49 from start of PT. Baseline: 40 Goal status: MET         PLAN: PT FREQUENCY: 1x/week   PT DURATION: until f/u with MD on 11/10.  Recert 84-5-73 through 03/12/22   PLANNED INTERVENTIONS: Therapeutic exercises,  Therapeutic activity, Neuromuscular re-education, Gait training, Patient/Family education, Self Care, Joint mobilization, Dry Needling, Spinal mobilization, Moist heat, Taping, Manual therapy, and Re-evaluation.   PLAN FOR NEXT SESSION: We will DC at this time.   PHYSICAL THERAPY DISCHARGE SUMMARY  Visits from Start of Care: 11  Current functional level related to goals / functional outcomes: See above   Remaining deficits: See above   Education / Equipment: See above   Patient agrees to discharge. Patient goals were met. Patient is being discharged due to meeting the stated rehab goals.   Anderson Malta B. Oreta Soloway, PT 03/10/22 3:28 PM  Phone: [phone] Fax: [phone]  [facility] [address] [place], [place] [address] Phone # [phone] Fax [phone]

## 2022-03-12 ENCOUNTER — Ambulatory Visit: Payer: Medicare Other

## 2022-03-20 ENCOUNTER — Telehealth: Payer: Self-pay | Admitting: *Deleted

## 2022-03-20 DIAGNOSIS — Z6834 Body mass index (BMI) 34.0-34.9, adult: Secondary | ICD-10-CM | POA: Diagnosis not present

## 2022-03-20 DIAGNOSIS — G8929 Other chronic pain: Secondary | ICD-10-CM | POA: Diagnosis not present

## 2022-03-20 DIAGNOSIS — M5136 Other intervertebral disc degeneration, lumbar region: Secondary | ICD-10-CM | POA: Diagnosis not present

## 2022-03-20 NOTE — Patient Outreach (Signed)
  Care Coordination   03/20/2022 Name: Lauren English MRN: [id number] DOB: 1941/04/25   Care Coordination Outreach Attempts:  An unsuccessful telephone outreach was attempted today to offer the patient information about available care coordination services as a benefit of their health plan.   Follow Up Plan:  Additional outreach attempts will be made to offer the patient care coordination information and services.   Encounter Outcome:  No Answer  Care Coordination Interventions Activated:  No   Care Coordination Interventions:  No, not indicated    Elliot Cousin, RN Care Management Coordinator [facility] Main Office [phone]

## 2022-04-08 DIAGNOSIS — M47816 Spondylosis without myelopathy or radiculopathy, lumbar region: Secondary | ICD-10-CM | POA: Diagnosis not present

## 2022-04-08 DIAGNOSIS — Z6835 Body mass index (BMI) 35.0-35.9, adult: Secondary | ICD-10-CM | POA: Diagnosis not present

## 2022-04-16 DIAGNOSIS — M65341 Trigger finger, right ring finger: Secondary | ICD-10-CM | POA: Diagnosis not present

## 2022-04-16 DIAGNOSIS — M79645 Pain in left finger(s): Secondary | ICD-10-CM | POA: Diagnosis not present

## 2022-04-16 DIAGNOSIS — M79644 Pain in right finger(s): Secondary | ICD-10-CM | POA: Diagnosis not present

## 2022-04-16 DIAGNOSIS — M65342 Trigger finger, left ring finger: Secondary | ICD-10-CM | POA: Diagnosis not present

## 2022-04-20 DIAGNOSIS — M47816 Spondylosis without myelopathy or radiculopathy, lumbar region: Secondary | ICD-10-CM | POA: Diagnosis not present

## 2022-04-29 ENCOUNTER — Encounter: Payer: Self-pay | Admitting: *Deleted

## 2022-04-29 ENCOUNTER — Telehealth: Payer: Self-pay | Admitting: *Deleted

## 2022-04-29 NOTE — Patient Instructions (Signed)
Visit Information  Thank you for taking time to visit with me today. Please don't hesitate to contact me if I can be of assistance to you.   Following are the goals we discussed today:   Goals Addressed             This Visit's Progress    COMPLETED: Care coordination activity       Care Coordination Interventions: Provided education to patient and/or caregiver about advanced directives Reviewed medications with patient and discussed adherence with no needed refills Reviewed scheduled/upcoming provider appointments including sufficient transportation source Screening for signs and symptoms of depression related to chronic disease state  Assessed social determinant of health barriers Educated on care management services and disease specific programs. Declined any needs at this time for services.         Please call the care guide team at [phone] if you need to cancel or reschedule your appointment.   If you are experiencing a Mental Health or Behavioral Health Crisis or need someone to talk to, please call the Suicide and Crisis Lifeline: 988  Patient verbalizes understanding of instructions and care plan provided today and agrees to view in MyChart. Active MyChart status and patient understanding of how to access instructions and care plan via MyChart confirmed with patient.     No further follow up required: No follow up needs   Elliot Cousin, RN Care Management Coordinator [facility] [facility] Main Office [phone]

## 2022-04-29 NOTE — Patient Outreach (Signed)
  Care Coordination   Initial Visit Note   04/29/2022 Name: Lauren English MRN: [id number] DOB: 1940-09-08  Lauren English is a 81 y.o. year old female who sees [name], MD for primary care. I spoke with  [name] by phone today.  What matters to the patients health and wellness today?  No needs     Goals Addressed             This Visit's Progress    COMPLETED: Care coordination activity       Care Coordination Interventions: Provided education to patient and/or caregiver about advanced directives Reviewed medications with patient and discussed adherence with no needed refills Reviewed scheduled/upcoming provider appointments including sufficient transportation source Screening for signs and symptoms of depression related to chronic disease state  Assessed social determinant of health barriers Educated on care management services and disease specific programs. Declined any needs at this time for services.         SDOH assessments and interventions completed:  Yes  SDOH Interventions Today    Flowsheet Row Most Recent Value  SDOH Interventions   Food Insecurity Interventions Intervention Not Indicated  Housing Interventions Intervention Not Indicated  Transportation Interventions Intervention Not Indicated  Utilities Interventions Intervention Not Indicated        Care Coordination Interventions:  Yes, provided   Follow up plan: No further intervention required.   Encounter Outcome:  Pt. Visit Completed   Elliot Cousin, RN Care Management Coordinator [facility] [employer] Main Office [phone]

## 2022-05-14 DIAGNOSIS — M47816 Spondylosis without myelopathy or radiculopathy, lumbar region: Secondary | ICD-10-CM | POA: Diagnosis not present

## 2022-06-01 DIAGNOSIS — M47816 Spondylosis without myelopathy or radiculopathy, lumbar region: Secondary | ICD-10-CM | POA: Diagnosis not present

## 2022-06-30 DIAGNOSIS — H353211 Exudative age-related macular degeneration, right eye, with active choroidal neovascularization: Secondary | ICD-10-CM | POA: Diagnosis not present

## 2022-06-30 DIAGNOSIS — H26493 Other secondary cataract, bilateral: Secondary | ICD-10-CM | POA: Diagnosis not present

## 2022-06-30 DIAGNOSIS — H40013 Open angle with borderline findings, low risk, bilateral: Secondary | ICD-10-CM | POA: Diagnosis not present

## 2022-06-30 DIAGNOSIS — H353121 Nonexudative age-related macular degeneration, left eye, early dry stage: Secondary | ICD-10-CM | POA: Diagnosis not present

## 2022-06-30 DIAGNOSIS — H04123 Dry eye syndrome of bilateral lacrimal glands: Secondary | ICD-10-CM | POA: Diagnosis not present

## 2022-07-02 ENCOUNTER — Encounter (INDEPENDENT_AMBULATORY_CARE_PROVIDER_SITE_OTHER): Payer: Medicare Other | Admitting: Ophthalmology

## 2022-07-02 DIAGNOSIS — H353211 Exudative age-related macular degeneration, right eye, with active choroidal neovascularization: Secondary | ICD-10-CM

## 2022-07-02 DIAGNOSIS — H43813 Vitreous degeneration, bilateral: Secondary | ICD-10-CM

## 2022-07-30 ENCOUNTER — Encounter (INDEPENDENT_AMBULATORY_CARE_PROVIDER_SITE_OTHER): Payer: Medicare Other | Admitting: Ophthalmology

## 2022-07-30 DIAGNOSIS — H353122 Nonexudative age-related macular degeneration, left eye, intermediate dry stage: Secondary | ICD-10-CM | POA: Diagnosis not present

## 2022-07-30 DIAGNOSIS — H353211 Exudative age-related macular degeneration, right eye, with active choroidal neovascularization: Secondary | ICD-10-CM | POA: Diagnosis not present

## 2022-07-30 DIAGNOSIS — H43813 Vitreous degeneration, bilateral: Secondary | ICD-10-CM | POA: Diagnosis not present

## 2022-08-04 DIAGNOSIS — Z6834 Body mass index (BMI) 34.0-34.9, adult: Secondary | ICD-10-CM | POA: Diagnosis not present

## 2022-08-04 DIAGNOSIS — M47816 Spondylosis without myelopathy or radiculopathy, lumbar region: Secondary | ICD-10-CM | POA: Diagnosis not present

## 2022-08-14 DIAGNOSIS — E559 Vitamin D deficiency, unspecified: Secondary | ICD-10-CM | POA: Diagnosis not present

## 2022-08-14 DIAGNOSIS — Z79899 Other long term (current) drug therapy: Secondary | ICD-10-CM | POA: Diagnosis not present

## 2022-08-14 DIAGNOSIS — R7303 Prediabetes: Secondary | ICD-10-CM | POA: Diagnosis not present

## 2022-08-14 DIAGNOSIS — E7800 Pure hypercholesterolemia, unspecified: Secondary | ICD-10-CM | POA: Diagnosis not present

## 2022-08-17 DIAGNOSIS — I700 Atherosclerosis of aorta: Secondary | ICD-10-CM | POA: Diagnosis not present

## 2022-08-17 DIAGNOSIS — F330 Major depressive disorder, recurrent, mild: Secondary | ICD-10-CM | POA: Diagnosis not present

## 2022-08-17 DIAGNOSIS — Z0000 Encounter for general adult medical examination without abnormal findings: Secondary | ICD-10-CM | POA: Diagnosis not present

## 2022-08-17 DIAGNOSIS — Z79899 Other long term (current) drug therapy: Secondary | ICD-10-CM | POA: Diagnosis not present

## 2022-08-17 DIAGNOSIS — G4730 Sleep apnea, unspecified: Secondary | ICD-10-CM | POA: Diagnosis not present

## 2022-08-17 DIAGNOSIS — R7303 Prediabetes: Secondary | ICD-10-CM | POA: Diagnosis not present

## 2022-08-17 DIAGNOSIS — E7800 Pure hypercholesterolemia, unspecified: Secondary | ICD-10-CM | POA: Diagnosis not present

## 2022-08-17 DIAGNOSIS — M5136 Other intervertebral disc degeneration, lumbar region: Secondary | ICD-10-CM | POA: Diagnosis not present

## 2022-08-17 DIAGNOSIS — E559 Vitamin D deficiency, unspecified: Secondary | ICD-10-CM | POA: Diagnosis not present

## 2022-08-27 ENCOUNTER — Encounter (INDEPENDENT_AMBULATORY_CARE_PROVIDER_SITE_OTHER): Payer: Medicare Other | Admitting: Ophthalmology

## 2022-08-27 DIAGNOSIS — H353122 Nonexudative age-related macular degeneration, left eye, intermediate dry stage: Secondary | ICD-10-CM

## 2022-08-27 DIAGNOSIS — H43813 Vitreous degeneration, bilateral: Secondary | ICD-10-CM | POA: Diagnosis not present

## 2022-08-27 DIAGNOSIS — H353211 Exudative age-related macular degeneration, right eye, with active choroidal neovascularization: Secondary | ICD-10-CM | POA: Diagnosis not present

## 2022-09-15 DIAGNOSIS — Z6834 Body mass index (BMI) 34.0-34.9, adult: Secondary | ICD-10-CM | POA: Diagnosis not present

## 2022-09-15 DIAGNOSIS — M47816 Spondylosis without myelopathy or radiculopathy, lumbar region: Secondary | ICD-10-CM | POA: Diagnosis not present

## 2022-09-24 ENCOUNTER — Encounter (INDEPENDENT_AMBULATORY_CARE_PROVIDER_SITE_OTHER): Payer: Medicare Other | Admitting: Ophthalmology

## 2022-09-24 DIAGNOSIS — H353122 Nonexudative age-related macular degeneration, left eye, intermediate dry stage: Secondary | ICD-10-CM

## 2022-09-24 DIAGNOSIS — H353211 Exudative age-related macular degeneration, right eye, with active choroidal neovascularization: Secondary | ICD-10-CM | POA: Diagnosis not present

## 2022-09-24 DIAGNOSIS — H43813 Vitreous degeneration, bilateral: Secondary | ICD-10-CM

## 2022-10-22 ENCOUNTER — Encounter (INDEPENDENT_AMBULATORY_CARE_PROVIDER_SITE_OTHER): Payer: Medicare Other | Admitting: Ophthalmology

## 2022-10-22 DIAGNOSIS — H353122 Nonexudative age-related macular degeneration, left eye, intermediate dry stage: Secondary | ICD-10-CM

## 2022-10-22 DIAGNOSIS — H43813 Vitreous degeneration, bilateral: Secondary | ICD-10-CM

## 2022-10-22 DIAGNOSIS — H353211 Exudative age-related macular degeneration, right eye, with active choroidal neovascularization: Secondary | ICD-10-CM | POA: Diagnosis not present

## 2022-11-19 ENCOUNTER — Encounter (INDEPENDENT_AMBULATORY_CARE_PROVIDER_SITE_OTHER): Payer: Medicare Other | Admitting: Ophthalmology

## 2022-11-19 DIAGNOSIS — H353121 Nonexudative age-related macular degeneration, left eye, early dry stage: Secondary | ICD-10-CM

## 2022-11-19 DIAGNOSIS — H43813 Vitreous degeneration, bilateral: Secondary | ICD-10-CM

## 2022-11-19 DIAGNOSIS — H353211 Exudative age-related macular degeneration, right eye, with active choroidal neovascularization: Secondary | ICD-10-CM

## 2022-12-17 ENCOUNTER — Encounter (INDEPENDENT_AMBULATORY_CARE_PROVIDER_SITE_OTHER): Payer: Medicare Other | Admitting: Ophthalmology

## 2022-12-22 ENCOUNTER — Encounter (INDEPENDENT_AMBULATORY_CARE_PROVIDER_SITE_OTHER): Payer: Medicare Other | Admitting: Ophthalmology

## 2022-12-22 DIAGNOSIS — H353211 Exudative age-related macular degeneration, right eye, with active choroidal neovascularization: Secondary | ICD-10-CM

## 2022-12-22 DIAGNOSIS — H353121 Nonexudative age-related macular degeneration, left eye, early dry stage: Secondary | ICD-10-CM

## 2022-12-22 DIAGNOSIS — H43813 Vitreous degeneration, bilateral: Secondary | ICD-10-CM | POA: Diagnosis not present

## 2023-01-04 DIAGNOSIS — H04123 Dry eye syndrome of bilateral lacrimal glands: Secondary | ICD-10-CM | POA: Diagnosis not present

## 2023-01-04 DIAGNOSIS — H353211 Exudative age-related macular degeneration, right eye, with active choroidal neovascularization: Secondary | ICD-10-CM | POA: Diagnosis not present

## 2023-01-04 DIAGNOSIS — H353121 Nonexudative age-related macular degeneration, left eye, early dry stage: Secondary | ICD-10-CM | POA: Diagnosis not present

## 2023-01-04 DIAGNOSIS — H40013 Open angle with borderline findings, low risk, bilateral: Secondary | ICD-10-CM | POA: Diagnosis not present

## 2023-01-21 ENCOUNTER — Encounter (INDEPENDENT_AMBULATORY_CARE_PROVIDER_SITE_OTHER): Payer: Medicare Other | Admitting: Ophthalmology

## 2023-01-21 DIAGNOSIS — H353211 Exudative age-related macular degeneration, right eye, with active choroidal neovascularization: Secondary | ICD-10-CM

## 2023-01-21 DIAGNOSIS — H353122 Nonexudative age-related macular degeneration, left eye, intermediate dry stage: Secondary | ICD-10-CM | POA: Diagnosis not present

## 2023-01-21 DIAGNOSIS — H43813 Vitreous degeneration, bilateral: Secondary | ICD-10-CM | POA: Diagnosis not present

## 2023-02-18 ENCOUNTER — Encounter (INDEPENDENT_AMBULATORY_CARE_PROVIDER_SITE_OTHER): Payer: Medicare Other | Admitting: Ophthalmology

## 2023-02-18 DIAGNOSIS — H43813 Vitreous degeneration, bilateral: Secondary | ICD-10-CM

## 2023-02-18 DIAGNOSIS — H353211 Exudative age-related macular degeneration, right eye, with active choroidal neovascularization: Secondary | ICD-10-CM | POA: Diagnosis not present

## 2023-02-18 DIAGNOSIS — H353121 Nonexudative age-related macular degeneration, left eye, early dry stage: Secondary | ICD-10-CM

## 2023-02-22 DIAGNOSIS — N3000 Acute cystitis without hematuria: Secondary | ICD-10-CM | POA: Diagnosis not present

## 2023-02-22 DIAGNOSIS — R7303 Prediabetes: Secondary | ICD-10-CM | POA: Diagnosis not present

## 2023-02-22 DIAGNOSIS — Z23 Encounter for immunization: Secondary | ICD-10-CM | POA: Diagnosis not present

## 2023-03-18 ENCOUNTER — Encounter (INDEPENDENT_AMBULATORY_CARE_PROVIDER_SITE_OTHER): Payer: Medicare Other | Admitting: Ophthalmology

## 2023-03-18 DIAGNOSIS — H43813 Vitreous degeneration, bilateral: Secondary | ICD-10-CM

## 2023-03-18 DIAGNOSIS — H353121 Nonexudative age-related macular degeneration, left eye, early dry stage: Secondary | ICD-10-CM

## 2023-03-18 DIAGNOSIS — H353211 Exudative age-related macular degeneration, right eye, with active choroidal neovascularization: Secondary | ICD-10-CM

## 2023-04-22 ENCOUNTER — Encounter (INDEPENDENT_AMBULATORY_CARE_PROVIDER_SITE_OTHER): Payer: Medicare Other | Admitting: Ophthalmology

## 2023-04-22 DIAGNOSIS — H43813 Vitreous degeneration, bilateral: Secondary | ICD-10-CM | POA: Diagnosis not present

## 2023-04-22 DIAGNOSIS — H353211 Exudative age-related macular degeneration, right eye, with active choroidal neovascularization: Secondary | ICD-10-CM | POA: Diagnosis not present

## 2023-04-22 DIAGNOSIS — H353121 Nonexudative age-related macular degeneration, left eye, early dry stage: Secondary | ICD-10-CM | POA: Diagnosis not present

## 2023-05-27 ENCOUNTER — Encounter (INDEPENDENT_AMBULATORY_CARE_PROVIDER_SITE_OTHER): Payer: Medicare Other | Admitting: Ophthalmology

## 2023-05-27 DIAGNOSIS — H353121 Nonexudative age-related macular degeneration, left eye, early dry stage: Secondary | ICD-10-CM | POA: Diagnosis not present

## 2023-05-27 DIAGNOSIS — H353211 Exudative age-related macular degeneration, right eye, with active choroidal neovascularization: Secondary | ICD-10-CM

## 2023-05-27 DIAGNOSIS — H43813 Vitreous degeneration, bilateral: Secondary | ICD-10-CM | POA: Diagnosis not present

## 2023-06-24 ENCOUNTER — Encounter (INDEPENDENT_AMBULATORY_CARE_PROVIDER_SITE_OTHER): Payer: Medicare Other | Admitting: Ophthalmology

## 2023-06-24 DIAGNOSIS — H353211 Exudative age-related macular degeneration, right eye, with active choroidal neovascularization: Secondary | ICD-10-CM | POA: Diagnosis not present

## 2023-06-24 DIAGNOSIS — H353121 Nonexudative age-related macular degeneration, left eye, early dry stage: Secondary | ICD-10-CM | POA: Diagnosis not present

## 2023-06-24 DIAGNOSIS — H43813 Vitreous degeneration, bilateral: Secondary | ICD-10-CM | POA: Diagnosis not present

## 2023-07-22 ENCOUNTER — Encounter (INDEPENDENT_AMBULATORY_CARE_PROVIDER_SITE_OTHER): Payer: Medicare Other | Admitting: Ophthalmology

## 2023-07-22 DIAGNOSIS — H353211 Exudative age-related macular degeneration, right eye, with active choroidal neovascularization: Secondary | ICD-10-CM

## 2023-07-22 DIAGNOSIS — H353121 Nonexudative age-related macular degeneration, left eye, early dry stage: Secondary | ICD-10-CM | POA: Diagnosis not present

## 2023-07-22 DIAGNOSIS — H43813 Vitreous degeneration, bilateral: Secondary | ICD-10-CM

## 2023-07-30 DIAGNOSIS — I700 Atherosclerosis of aorta: Secondary | ICD-10-CM | POA: Diagnosis not present

## 2023-08-09 DIAGNOSIS — F330 Major depressive disorder, recurrent, mild: Secondary | ICD-10-CM | POA: Diagnosis not present

## 2023-08-09 DIAGNOSIS — I700 Atherosclerosis of aorta: Secondary | ICD-10-CM | POA: Diagnosis not present

## 2023-08-09 DIAGNOSIS — E7800 Pure hypercholesterolemia, unspecified: Secondary | ICD-10-CM | POA: Diagnosis not present

## 2023-08-17 DIAGNOSIS — H40013 Open angle with borderline findings, low risk, bilateral: Secondary | ICD-10-CM | POA: Diagnosis not present

## 2023-08-17 DIAGNOSIS — H353211 Exudative age-related macular degeneration, right eye, with active choroidal neovascularization: Secondary | ICD-10-CM | POA: Diagnosis not present

## 2023-08-17 DIAGNOSIS — H353121 Nonexudative age-related macular degeneration, left eye, early dry stage: Secondary | ICD-10-CM | POA: Diagnosis not present

## 2023-08-17 DIAGNOSIS — H04123 Dry eye syndrome of bilateral lacrimal glands: Secondary | ICD-10-CM | POA: Diagnosis not present

## 2023-08-19 ENCOUNTER — Encounter (INDEPENDENT_AMBULATORY_CARE_PROVIDER_SITE_OTHER): Admitting: Ophthalmology

## 2023-08-19 DIAGNOSIS — H353211 Exudative age-related macular degeneration, right eye, with active choroidal neovascularization: Secondary | ICD-10-CM | POA: Diagnosis not present

## 2023-08-19 DIAGNOSIS — H353122 Nonexudative age-related macular degeneration, left eye, intermediate dry stage: Secondary | ICD-10-CM | POA: Diagnosis not present

## 2023-08-19 DIAGNOSIS — H43813 Vitreous degeneration, bilateral: Secondary | ICD-10-CM | POA: Diagnosis not present

## 2023-08-20 DIAGNOSIS — E559 Vitamin D deficiency, unspecified: Secondary | ICD-10-CM | POA: Diagnosis not present

## 2023-08-20 DIAGNOSIS — Z8719 Personal history of other diseases of the digestive system: Secondary | ICD-10-CM | POA: Diagnosis not present

## 2023-08-20 DIAGNOSIS — F330 Major depressive disorder, recurrent, mild: Secondary | ICD-10-CM | POA: Diagnosis not present

## 2023-08-20 DIAGNOSIS — E7800 Pure hypercholesterolemia, unspecified: Secondary | ICD-10-CM | POA: Diagnosis not present

## 2023-08-20 DIAGNOSIS — Z0000 Encounter for general adult medical examination without abnormal findings: Secondary | ICD-10-CM | POA: Diagnosis not present

## 2023-08-20 DIAGNOSIS — G4733 Obstructive sleep apnea (adult) (pediatric): Secondary | ICD-10-CM | POA: Diagnosis not present

## 2023-08-20 DIAGNOSIS — I700 Atherosclerosis of aorta: Secondary | ICD-10-CM | POA: Diagnosis not present

## 2023-08-20 DIAGNOSIS — Z79899 Other long term (current) drug therapy: Secondary | ICD-10-CM | POA: Diagnosis not present

## 2023-08-20 DIAGNOSIS — R7303 Prediabetes: Secondary | ICD-10-CM | POA: Diagnosis not present

## 2023-08-20 DIAGNOSIS — M5416 Radiculopathy, lumbar region: Secondary | ICD-10-CM | POA: Diagnosis not present

## 2023-08-28 DIAGNOSIS — I700 Atherosclerosis of aorta: Secondary | ICD-10-CM | POA: Diagnosis not present

## 2023-09-08 DIAGNOSIS — F330 Major depressive disorder, recurrent, mild: Secondary | ICD-10-CM | POA: Diagnosis not present

## 2023-09-08 DIAGNOSIS — E7800 Pure hypercholesterolemia, unspecified: Secondary | ICD-10-CM | POA: Diagnosis not present

## 2023-09-08 DIAGNOSIS — I700 Atherosclerosis of aorta: Secondary | ICD-10-CM | POA: Diagnosis not present

## 2023-09-20 DIAGNOSIS — K5790 Diverticulosis of intestine, part unspecified, without perforation or abscess without bleeding: Secondary | ICD-10-CM | POA: Diagnosis not present

## 2023-09-20 DIAGNOSIS — K921 Melena: Secondary | ICD-10-CM | POA: Diagnosis not present

## 2023-09-20 DIAGNOSIS — K639 Disease of intestine, unspecified: Secondary | ICD-10-CM | POA: Diagnosis not present

## 2023-09-23 ENCOUNTER — Encounter (INDEPENDENT_AMBULATORY_CARE_PROVIDER_SITE_OTHER): Admitting: Ophthalmology

## 2023-09-23 DIAGNOSIS — H353211 Exudative age-related macular degeneration, right eye, with active choroidal neovascularization: Secondary | ICD-10-CM

## 2023-09-23 DIAGNOSIS — H353122 Nonexudative age-related macular degeneration, left eye, intermediate dry stage: Secondary | ICD-10-CM | POA: Diagnosis not present

## 2023-09-23 DIAGNOSIS — H43813 Vitreous degeneration, bilateral: Secondary | ICD-10-CM

## 2023-09-27 DIAGNOSIS — I700 Atherosclerosis of aorta: Secondary | ICD-10-CM | POA: Diagnosis not present

## 2023-10-09 DIAGNOSIS — I700 Atherosclerosis of aorta: Secondary | ICD-10-CM | POA: Diagnosis not present

## 2023-10-09 DIAGNOSIS — F330 Major depressive disorder, recurrent, mild: Secondary | ICD-10-CM | POA: Diagnosis not present

## 2023-10-09 DIAGNOSIS — E7800 Pure hypercholesterolemia, unspecified: Secondary | ICD-10-CM | POA: Diagnosis not present

## 2023-10-18 DIAGNOSIS — K921 Melena: Secondary | ICD-10-CM | POA: Diagnosis not present

## 2023-10-18 DIAGNOSIS — K635 Polyp of colon: Secondary | ICD-10-CM | POA: Diagnosis not present

## 2023-10-18 DIAGNOSIS — K52831 Collagenous colitis: Secondary | ICD-10-CM | POA: Diagnosis not present

## 2023-10-18 DIAGNOSIS — K5730 Diverticulosis of large intestine without perforation or abscess without bleeding: Secondary | ICD-10-CM | POA: Diagnosis not present

## 2023-10-18 DIAGNOSIS — K648 Other hemorrhoids: Secondary | ICD-10-CM | POA: Diagnosis not present

## 2023-10-21 DIAGNOSIS — K52831 Collagenous colitis: Secondary | ICD-10-CM | POA: Diagnosis not present

## 2023-10-21 DIAGNOSIS — K635 Polyp of colon: Secondary | ICD-10-CM | POA: Diagnosis not present

## 2023-10-27 DIAGNOSIS — I700 Atherosclerosis of aorta: Secondary | ICD-10-CM | POA: Diagnosis not present

## 2023-10-28 ENCOUNTER — Encounter (INDEPENDENT_AMBULATORY_CARE_PROVIDER_SITE_OTHER): Admitting: Ophthalmology

## 2023-10-28 DIAGNOSIS — H43813 Vitreous degeneration, bilateral: Secondary | ICD-10-CM | POA: Diagnosis not present

## 2023-10-28 DIAGNOSIS — H353121 Nonexudative age-related macular degeneration, left eye, early dry stage: Secondary | ICD-10-CM

## 2023-10-28 DIAGNOSIS — H353211 Exudative age-related macular degeneration, right eye, with active choroidal neovascularization: Secondary | ICD-10-CM | POA: Diagnosis not present

## 2023-11-08 DIAGNOSIS — E7800 Pure hypercholesterolemia, unspecified: Secondary | ICD-10-CM | POA: Diagnosis not present

## 2023-11-08 DIAGNOSIS — I700 Atherosclerosis of aorta: Secondary | ICD-10-CM | POA: Diagnosis not present

## 2023-11-08 DIAGNOSIS — F330 Major depressive disorder, recurrent, mild: Secondary | ICD-10-CM | POA: Diagnosis not present

## 2023-11-17 DIAGNOSIS — N3000 Acute cystitis without hematuria: Secondary | ICD-10-CM | POA: Diagnosis not present

## 2023-11-17 DIAGNOSIS — N390 Urinary tract infection, site not specified: Secondary | ICD-10-CM | POA: Diagnosis not present

## 2023-11-18 DIAGNOSIS — N390 Urinary tract infection, site not specified: Secondary | ICD-10-CM | POA: Diagnosis not present

## 2023-11-26 DIAGNOSIS — I700 Atherosclerosis of aorta: Secondary | ICD-10-CM | POA: Diagnosis not present

## 2023-12-02 ENCOUNTER — Encounter (INDEPENDENT_AMBULATORY_CARE_PROVIDER_SITE_OTHER): Admitting: Ophthalmology

## 2023-12-02 DIAGNOSIS — H353231 Exudative age-related macular degeneration, bilateral, with active choroidal neovascularization: Secondary | ICD-10-CM | POA: Diagnosis not present

## 2023-12-02 DIAGNOSIS — H43813 Vitreous degeneration, bilateral: Secondary | ICD-10-CM

## 2023-12-09 DIAGNOSIS — E7800 Pure hypercholesterolemia, unspecified: Secondary | ICD-10-CM | POA: Diagnosis not present

## 2023-12-09 DIAGNOSIS — F330 Major depressive disorder, recurrent, mild: Secondary | ICD-10-CM | POA: Diagnosis not present

## 2023-12-26 DIAGNOSIS — I700 Atherosclerosis of aorta: Secondary | ICD-10-CM | POA: Diagnosis not present

## 2023-12-29 ENCOUNTER — Other Ambulatory Visit (HOSPITAL_BASED_OUTPATIENT_CLINIC_OR_DEPARTMENT_OTHER): Payer: Self-pay | Admitting: Family Medicine

## 2023-12-29 DIAGNOSIS — Z1231 Encounter for screening mammogram for malignant neoplasm of breast: Secondary | ICD-10-CM

## 2024-01-01 ENCOUNTER — Encounter (HOSPITAL_BASED_OUTPATIENT_CLINIC_OR_DEPARTMENT_OTHER): Payer: Self-pay | Admitting: Radiology

## 2024-01-01 ENCOUNTER — Ambulatory Visit (HOSPITAL_BASED_OUTPATIENT_CLINIC_OR_DEPARTMENT_OTHER)
Admission: RE | Admit: 2024-01-01 | Discharge: 2024-01-01 | Disposition: A | Discharge status: Home / Self Care | Source: Ambulatory Visit | Attending: Family Medicine | Admitting: Family Medicine

## 2024-01-01 DIAGNOSIS — Z1231 Encounter for screening mammogram for malignant neoplasm of breast: Secondary | ICD-10-CM | POA: Diagnosis not present

## 2024-01-09 DIAGNOSIS — F330 Major depressive disorder, recurrent, mild: Secondary | ICD-10-CM | POA: Diagnosis not present

## 2024-01-09 DIAGNOSIS — I700 Atherosclerosis of aorta: Secondary | ICD-10-CM | POA: Diagnosis not present

## 2024-01-09 DIAGNOSIS — E7800 Pure hypercholesterolemia, unspecified: Secondary | ICD-10-CM | POA: Diagnosis not present

## 2024-01-13 ENCOUNTER — Encounter (INDEPENDENT_AMBULATORY_CARE_PROVIDER_SITE_OTHER): Admitting: Ophthalmology

## 2024-01-13 DIAGNOSIS — H353231 Exudative age-related macular degeneration, bilateral, with active choroidal neovascularization: Secondary | ICD-10-CM

## 2024-01-13 DIAGNOSIS — H43813 Vitreous degeneration, bilateral: Secondary | ICD-10-CM | POA: Diagnosis not present

## 2024-01-23 ENCOUNTER — Other Ambulatory Visit (HOSPITAL_BASED_OUTPATIENT_CLINIC_OR_DEPARTMENT_OTHER): Payer: Self-pay | Admitting: Family Medicine

## 2024-01-23 DIAGNOSIS — E2839 Other primary ovarian failure: Secondary | ICD-10-CM

## 2024-01-25 DIAGNOSIS — I700 Atherosclerosis of aorta: Secondary | ICD-10-CM | POA: Diagnosis not present

## 2024-02-08 DIAGNOSIS — E7800 Pure hypercholesterolemia, unspecified: Secondary | ICD-10-CM | POA: Diagnosis not present

## 2024-02-08 DIAGNOSIS — F330 Major depressive disorder, recurrent, mild: Secondary | ICD-10-CM | POA: Diagnosis not present

## 2024-02-17 ENCOUNTER — Encounter (INDEPENDENT_AMBULATORY_CARE_PROVIDER_SITE_OTHER): Admitting: Ophthalmology

## 2024-02-17 DIAGNOSIS — H43813 Vitreous degeneration, bilateral: Secondary | ICD-10-CM

## 2024-02-17 DIAGNOSIS — H353231 Exudative age-related macular degeneration, bilateral, with active choroidal neovascularization: Secondary | ICD-10-CM | POA: Diagnosis not present

## 2024-02-21 DIAGNOSIS — R7303 Prediabetes: Secondary | ICD-10-CM | POA: Diagnosis not present

## 2024-02-21 DIAGNOSIS — R300 Dysuria: Secondary | ICD-10-CM | POA: Diagnosis not present

## 2024-02-21 DIAGNOSIS — M5136 Other intervertebral disc degeneration, lumbar region with discogenic back pain only: Secondary | ICD-10-CM | POA: Diagnosis not present

## 2024-02-21 DIAGNOSIS — Z79899 Other long term (current) drug therapy: Secondary | ICD-10-CM | POA: Diagnosis not present

## 2024-02-21 DIAGNOSIS — Z23 Encounter for immunization: Secondary | ICD-10-CM | POA: Diagnosis not present

## 2024-02-24 DIAGNOSIS — I700 Atherosclerosis of aorta: Secondary | ICD-10-CM | POA: Diagnosis not present

## 2024-03-10 DIAGNOSIS — E7800 Pure hypercholesterolemia, unspecified: Secondary | ICD-10-CM | POA: Diagnosis not present

## 2024-03-10 DIAGNOSIS — I700 Atherosclerosis of aorta: Secondary | ICD-10-CM | POA: Diagnosis not present

## 2024-03-10 DIAGNOSIS — F330 Major depressive disorder, recurrent, mild: Secondary | ICD-10-CM | POA: Diagnosis not present

## 2024-03-25 DIAGNOSIS — I700 Atherosclerosis of aorta: Secondary | ICD-10-CM | POA: Diagnosis not present

## 2024-03-27 ENCOUNTER — Encounter (INDEPENDENT_AMBULATORY_CARE_PROVIDER_SITE_OTHER): Admitting: Ophthalmology

## 2024-03-27 DIAGNOSIS — H43813 Vitreous degeneration, bilateral: Secondary | ICD-10-CM

## 2024-03-27 DIAGNOSIS — H353231 Exudative age-related macular degeneration, bilateral, with active choroidal neovascularization: Secondary | ICD-10-CM

## 2024-04-09 DIAGNOSIS — I700 Atherosclerosis of aorta: Secondary | ICD-10-CM | POA: Diagnosis not present

## 2024-04-09 DIAGNOSIS — F330 Major depressive disorder, recurrent, mild: Secondary | ICD-10-CM | POA: Diagnosis not present

## 2024-04-09 DIAGNOSIS — E7800 Pure hypercholesterolemia, unspecified: Secondary | ICD-10-CM | POA: Diagnosis not present

## 2024-05-01 ENCOUNTER — Encounter (INDEPENDENT_AMBULATORY_CARE_PROVIDER_SITE_OTHER): Admitting: Ophthalmology

## 2024-05-01 DIAGNOSIS — H43813 Vitreous degeneration, bilateral: Secondary | ICD-10-CM | POA: Diagnosis not present

## 2024-05-01 DIAGNOSIS — H353231 Exudative age-related macular degeneration, bilateral, with active choroidal neovascularization: Secondary | ICD-10-CM

## 2024-06-08 ENCOUNTER — Encounter (INDEPENDENT_AMBULATORY_CARE_PROVIDER_SITE_OTHER): Admitting: Ophthalmology

## 2024-06-08 DIAGNOSIS — H353231 Exudative age-related macular degeneration, bilateral, with active choroidal neovascularization: Secondary | ICD-10-CM

## 2024-06-08 DIAGNOSIS — H43813 Vitreous degeneration, bilateral: Secondary | ICD-10-CM | POA: Diagnosis not present

## 2024-06-19 ENCOUNTER — Other Ambulatory Visit (HOSPITAL_BASED_OUTPATIENT_CLINIC_OR_DEPARTMENT_OTHER)

## 2024-07-13 ENCOUNTER — Encounter (INDEPENDENT_AMBULATORY_CARE_PROVIDER_SITE_OTHER): Admitting: Ophthalmology
